# Patient Record
Sex: Male | Born: 1935 | Race: White | Hispanic: No | Marital: Married | Smoking: Former smoker
Health system: Southern US, Community
[De-identification: ages and names within clinical notes are randomized; demographics above are authoritative.]

## PROBLEM LIST (undated history)

## (undated) DIAGNOSIS — Z950 Presence of cardiac pacemaker: Secondary | ICD-10-CM

## (undated) DIAGNOSIS — N186 End stage renal disease: Secondary | ICD-10-CM

## (undated) DIAGNOSIS — I495 Sick sinus syndrome: Secondary | ICD-10-CM

## (undated) DIAGNOSIS — D759 Disease of blood and blood-forming organs, unspecified: Secondary | ICD-10-CM

## (undated) DIAGNOSIS — D649 Anemia, unspecified: Secondary | ICD-10-CM

## (undated) DIAGNOSIS — I2510 Atherosclerotic heart disease of native coronary artery without angina pectoris: Secondary | ICD-10-CM

## (undated) DIAGNOSIS — M1990 Unspecified osteoarthritis, unspecified site: Secondary | ICD-10-CM

## (undated) DIAGNOSIS — K5900 Constipation, unspecified: Secondary | ICD-10-CM

## (undated) DIAGNOSIS — M25519 Pain in unspecified shoulder: Secondary | ICD-10-CM

## (undated) DIAGNOSIS — I739 Peripheral vascular disease, unspecified: Secondary | ICD-10-CM

## (undated) DIAGNOSIS — I313 Pericardial effusion (noninflammatory): Secondary | ICD-10-CM

## (undated) DIAGNOSIS — M3214 Glomerular disease in systemic lupus erythematosus: Secondary | ICD-10-CM

## (undated) DIAGNOSIS — L0390 Cellulitis, unspecified: Secondary | ICD-10-CM

## (undated) DIAGNOSIS — N400 Enlarged prostate without lower urinary tract symptoms: Secondary | ICD-10-CM

## (undated) DIAGNOSIS — R0602 Shortness of breath: Secondary | ICD-10-CM

## (undated) DIAGNOSIS — I5033 Acute on chronic diastolic (congestive) heart failure: Secondary | ICD-10-CM

## (undated) DIAGNOSIS — I499 Cardiac arrhythmia, unspecified: Secondary | ICD-10-CM

## (undated) DIAGNOSIS — G2581 Restless legs syndrome: Secondary | ICD-10-CM

## (undated) DIAGNOSIS — Z8679 Personal history of other diseases of the circulatory system: Secondary | ICD-10-CM

## (undated) HISTORY — DX: Pain in unspecified shoulder: M25.519

## (undated) HISTORY — DX: Benign prostatic hyperplasia without lower urinary tract symptoms: N40.0

## (undated) HISTORY — DX: Pericardial effusion (noninflammatory): I31.3

## (undated) HISTORY — DX: Sick sinus syndrome: I49.5

## (undated) HISTORY — PX: HERNIA REPAIR: SHX51

## (undated) HISTORY — PX: APPENDECTOMY: SHX54

## (undated) HISTORY — DX: Glomerular disease in systemic lupus erythematosus: M32.14

## (undated) HISTORY — DX: Acute on chronic diastolic (congestive) heart failure: I50.33

## (undated) HISTORY — DX: Constipation, unspecified: K59.00

## (undated) HISTORY — DX: Peripheral vascular disease, unspecified: I73.9

## (undated) HISTORY — DX: Anemia, unspecified: D64.9

## (undated) HISTORY — DX: Cellulitis, unspecified: L03.90

## (undated) HISTORY — DX: Restless legs syndrome: G25.81

---

## 1997-05-29 ENCOUNTER — Other Ambulatory Visit: Admission: RE | Admit: 1997-05-29 | Discharge: 1997-05-29 | Payer: Self-pay | Admitting: *Deleted

## 1997-06-05 ENCOUNTER — Other Ambulatory Visit: Admission: RE | Admit: 1997-06-05 | Discharge: 1997-06-05 | Payer: Self-pay | Admitting: *Deleted

## 1997-06-20 ENCOUNTER — Other Ambulatory Visit: Admission: RE | Admit: 1997-06-20 | Discharge: 1997-06-20 | Payer: Self-pay | Admitting: Rheumatology

## 1997-08-09 ENCOUNTER — Other Ambulatory Visit: Admission: RE | Admit: 1997-08-09 | Discharge: 1997-08-09 | Payer: Self-pay | Admitting: *Deleted

## 2002-02-21 ENCOUNTER — Encounter: Admission: RE | Admit: 2002-02-21 | Discharge: 2002-02-21 | Payer: Self-pay | Admitting: Family Medicine

## 2002-02-21 ENCOUNTER — Encounter: Payer: Self-pay | Admitting: Family Medicine

## 2004-01-21 HISTORY — PX: PACEMAKER INSERTION: SHX728

## 2004-09-19 ENCOUNTER — Inpatient Hospital Stay (HOSPITAL_COMMUNITY): Admission: RE | Admit: 2004-09-19 | Discharge: 2004-09-20 | Payer: Self-pay | Admitting: Cardiovascular Disease

## 2004-09-19 ENCOUNTER — Encounter (INDEPENDENT_AMBULATORY_CARE_PROVIDER_SITE_OTHER): Payer: Self-pay | Admitting: Cardiovascular Disease

## 2004-09-19 DIAGNOSIS — Z950 Presence of cardiac pacemaker: Secondary | ICD-10-CM

## 2004-09-19 HISTORY — DX: Presence of cardiac pacemaker: Z95.0

## 2005-10-07 ENCOUNTER — Encounter: Admission: RE | Admit: 2005-10-07 | Discharge: 2005-10-07 | Payer: Self-pay | Admitting: Nephrology

## 2006-06-28 ENCOUNTER — Inpatient Hospital Stay (HOSPITAL_COMMUNITY): Admission: EM | Admit: 2006-06-28 | Discharge: 2006-06-30 | Payer: Self-pay | Admitting: Emergency Medicine

## 2006-11-18 ENCOUNTER — Encounter: Admission: RE | Admit: 2006-11-18 | Discharge: 2006-11-18 | Payer: Self-pay | Admitting: Nephrology

## 2007-02-23 ENCOUNTER — Emergency Department (HOSPITAL_COMMUNITY): Admission: EM | Admit: 2007-02-23 | Discharge: 2007-02-23 | Payer: Self-pay | Admitting: Emergency Medicine

## 2007-05-03 ENCOUNTER — Ambulatory Visit (HOSPITAL_COMMUNITY): Admission: RE | Admit: 2007-05-03 | Discharge: 2007-05-03 | Payer: Self-pay | Admitting: Cardiovascular Disease

## 2007-06-21 ENCOUNTER — Ambulatory Visit: Payer: Self-pay | Admitting: Vascular Surgery

## 2007-06-25 ENCOUNTER — Ambulatory Visit: Payer: Self-pay | Admitting: Vascular Surgery

## 2007-06-25 ENCOUNTER — Ambulatory Visit (HOSPITAL_COMMUNITY): Admission: RE | Admit: 2007-06-25 | Discharge: 2007-06-25 | Payer: Self-pay | Admitting: Vascular Surgery

## 2007-08-10 ENCOUNTER — Ambulatory Visit: Payer: Self-pay | Admitting: Vascular Surgery

## 2008-09-13 ENCOUNTER — Encounter: Admission: RE | Admit: 2008-09-13 | Discharge: 2008-09-13 | Payer: Self-pay | Admitting: Nephrology

## 2008-09-18 ENCOUNTER — Encounter: Admission: RE | Admit: 2008-09-18 | Discharge: 2008-09-18 | Payer: Self-pay | Admitting: Nephrology

## 2010-05-14 DIAGNOSIS — I2510 Atherosclerotic heart disease of native coronary artery without angina pectoris: Secondary | ICD-10-CM

## 2010-05-14 HISTORY — DX: Atherosclerotic heart disease of native coronary artery without angina pectoris: I25.10

## 2010-06-04 NOTE — Op Note (Signed)
Nathaniel Steele, Nathaniel Steele                ACCOUNT NO.:  [id number]   MEDICAL RECORD NO.:  [id number]          PATIENT TYPE:  AMB   LOCATION:  SDS                          FACILITY:  [facility]   PHYSICIAN:  Quita Skye. Hart Rochester, M.D.  DATE OF BIRTH:  [birth year]   DATE OF PROCEDURE:  06/25/2007  DATE OF DISCHARGE:  06/25/2007                               OPERATIVE REPORT   PREOPERATIVE DIAGNOSIS:  End-stage renal disease.   POSTOPERATIVE DIAGNOSIS:  End-stage renal disease.   OPERATION:  Creation of a left radial artery to cephalic vein AV fistula  (Cimino shunt).   SURGEON:  Quita Skye. Hart Rochester, MD   FIRST ASSISTANT:  Wilmon Arms, PA   ANESTHESIA:  Local.   PROCEDURE IN DETAIL:  The patient was taken to the operating room and  placed in supine position at which time the left upper extremity was  prepped with Betadine scrub solution and draped in routine sterile  manner.  After infiltration with 1% Xylocaine, short longitudinal  incision was made over the distal cephalic vein just proximal to the  wrist.  Cephalic vein was dissected free proximally and distally and was  ligated with 2-0 silk distally.  A large branch was ligated with 3-0  silk tie and divided, mobilizing the vein which was an excellent vein  being about 3 mm in size.  Artery was exposed beneath the fascia  encircled with vessel loops.  It had an excellent pulse.  After  occluding the artery proximally and distally with Vesseloops, it was  opened with 15 blade and extended with Potts scissors.  A 3-mm dilator  would easily traverse it.  The vein was carefully measured and  spatulated and anastomosed end-to-side with 6-0 Prolene.  Vesseloops was  then released.  There was excellent pulse and thrill in the fistula  distally.  No protamine or heparin was given.  The wounds were irrigated  with saline and closed in layers with Vicryl in subcuticular fashion.  Sterile dressing was applied.  The patient was taken to recovery room  in  satisfactory condition.      Quita Skye Hart Rochester, M.D.  Electronically Signed     JDL/MEDQ  D:  06/25/2007  T:  06/26/2007  Job:  [id number]

## 2010-06-04 NOTE — H&P (Signed)
NAMELYAL, HUSTED NO.:  [id number]   MEDICAL RECORD NO.:  [id number]          PATIENT TYPE:  INP   LOCATION:  1824                         FACILITY:  [facility]   PHYSICIAN:  Herbie Saxon, MDDATE OF BIRTH:  1926/04/21   DATE OF ADMISSION:  06/28/2006  DATE OF DISCHARGE:                              HISTORY & PHYSICAL   PRIMARY CARE PHYSICIAN:  Dr. Caryl Never.   CARDIOLOGIST:  Dr. Alanda Amass, [facility].   PRESENTING COMPLAINT:  1. Cough one week.  2. Fever one day.   HISTORY OF PRESENTING COMPLAINT:  This is a 77 year old Caucasian male  who was quite well until a week ago when he started coughing.  The cough  was productive of yellow phlegm.  No hemoptysis.  No palpitation, but he  noticed increasing shortness of breath, but generalized body aches,  malaise, anorexia, and he started having high-grade fever with chills  and rigor today necessitating him reporting to the emergency room.  Temperature on arrival was 102.  Patient has no symptoms so far today of  genitourinary or gastrointestinal system.  No cardiovascular symptoms at  present.  No loss of consciousness.  No skin rash.   PAST MEDICAL HISTORY:  1. Hypertension.  2. Lupus.   PAST SURGERIES:  He had the pacemaker inserted in 2006.   FAMILY HISTORY:  Noncontributory.   SOCIAL HISTORY:  No history of alcohol or drug abuse.  He used to smoke  likely less than 3 cigarettes a day, but he quit about 50 years ago.  He  smoked less than 10 years.   REVIEW OF SYSTEMS:  Twelve systems, pertinent positives as above.   MEDICATIONS:  Doses not known, include amlodipine, benazepril, Cymbalta,  aspirin, Plavix, __________ , and amiodarone.   GENERAL:  On examination, he is an elderly man, not critically-ill  looking, in mild respiratory distress.  VITAL SIGNS:  Temperature is 102, respiratory rate is 22, blood pressure  111/62, pulse rate is 71.  He is febrile to touch.  HEENT:   Pupils equal, reactive to light and accommodation.  Extraocular  muscles are intact.  No clubbing, no cyanosis, no jaundice.  NECK:  Elevated JVD.  No carotid bruits or thyromegaly.  CHEST:  Bilateral coarse breath sounds.  HEART:  Heart sounds 1 and 2 regular.  ABDOMEN:  Soft, nontender.  No organomegaly.  Bowel sounds are  normoactive.  -  No pedal edema.  No skin rash or ulcers.  NEUROLOGIC:  He is alert and oriented x3.  Peripheral pulses present.  No pedal edema.   He is a full code.   On examination, the available chemistries shows a sodium of 141,  potassium is 3.3, chloride is 107, bicarbonate is 22, BUN 40, creatinine  is 2.9, glucose is 117.  Other labs are pending.   The chest x-ray report pending.   DIAGNOSIS:  1. Pneumonia.  2. Sepsis.  3. Acute renal failure versus acute or chronic renal insufficiency.  4. History of Lupus.  5. Hypokalemia, on Lasix.  6. Hypertension, stable.  7. Cardiac arrhythmias, status post pacemaker insertion.  Patient is to be admitted to telemetry bed, gentle IV fluid hydration,  cooling blankets p.r.n., Tylenol 650 mg q.6 hours p.r.n. now for  temperature greater than 101.  Blood culture x2, urinalysis, urine  culture, chest x-ray, and EKG have been reviewed.  One set of cardiac  enzymes and EKG, check his electrolytes, calcium, magnesium phosphate,  TSH, and monitor his CBC.  We will check his coagulation workup.  Input  and output to be monitored.  O2, 2-3 liters by nasal canula, to keep his  pulse ox above 92.  Heparin 5000 units subcutaneous q.12 hours and  Phenergan 12.5 mg IV q.8 hours p.r.n. for nausea,  Proventil one unit  dose q.4 hours p.r.n. for shortness of breath.  Tylenol 650 mg p.o. q.4  hours p.r.n.  We will start him on antibiotics IV vancomycin at 750 mg  q.12 hours, Zosyn 2.25 mg IV q.8 hours.  We will resume his home  medications.  Supplement his potassium 40 mEq K-Dur, and 20 mEq daily.  We will check the urine  legionella antigen and the mycoplasma antibody.  Check an ABG stat, lactic acid level, LFTs, hemoccult.  We will follow  his CBC, BMP, and ABG in the morning.      Herbie Saxon, MD  Electronically Signed     MIO/MEDQ  D:  06/28/2006  T:  06/28/2006  Job:  [id number]   cc:   Evelena Peat, M.D.  Richard A. Alanda Amass, M.D.

## 2010-06-04 NOTE — Consult Note (Signed)
NEW PATIENT CONSULTATION   Nathaniel Steele, Nathaniel Steele  DOB:  [birth year]                                       06/21/2007  BJYNW#:[id number]   The patient is a 77 year old male with end-stage renal disease who has a  history hypertension, active lupus, chronic tremor, atrial fibrillation  and flutter with a pacemaker placed and on chronic Coumadin therapy.  He  has been evaluated for vascular access.  He is right-handed.  His  Coumadin is managed by Dr. Susa Griffins.  Creatinine has been  running in the 3.2 range.   PHYSICAL EXAM:  His blood pressure is 154/94, heart rate 68,  respirations are 14.  He is an elderly male in no apparent distress,  alert and oriented x3.  His carotid pulses are 3+ with no audible  bruits.  Upper extremity pulses are 3+ at the brachial and radial level.  He has easy bruisability of both upper extremities with thin skin.  He  does have a patent cephalic veins throughout both upper extremities.   Vein mapping was performed today in the [facility] office and reveals that his  left cephalic vein does appear to be adequate for AV fistula in the  forearm.  We will schedule him for creation of a Cimino fistula on  Friday June 5th at [facility] as an outpatient.  Hopefully this will  provide a satisfactory site for vascular access for this nice man.  He  realizes that this may not be successful and no further access may be  indicated or required.   Quita Skye Hart Rochester, M.D.  Electronically Signed   JDL/MEDQ  D:  06/21/2007  T:  06/22/2007  Job:  [id number]   cc:   Duke Salvia. Eliott Nine, M.D.  Evelena Peat, M.D.

## 2010-06-04 NOTE — Discharge Summary (Signed)
NAMEKAELEM, BRACH NO.:  [id number]   MEDICAL RECORD NO.:  [id number]          PATIENT TYPE:  INP   LOCATION:  4735                         FACILITY:  [facility]   PHYSICIAN:  Herbie Saxon, MDDATE OF BIRTH:  February 10, 1926   DATE OF ADMISSION:  06/28/2006  DATE OF DISCHARGE:  06/30/2006                               DISCHARGE SUMMARY   DISCHARGE DIAGNOSES:  1. Acute on chronic renal failure.  2. Lupus nephritis.  3. Left basal pneumonia, improved.  4. Anemia of chronic disease.  5. Acute respiratory distress, improved.  6. Hypertension, stable.  7. Cardiac arrhythmia  status post pacemaker.  8. Benign prostatic hypertrophy.  9. Dyslipidemia.  10.Reduced TSH on amiodarone.  11.Thrombocytopenia, stable.   HOSPITAL COURSE:  This 77 year old, Caucasian male was admitted via the  emergency room. He presented with high-grade fever of one-day duration.  He had been having a cough productive of yellow sputum for about a week  prior to presentation. Temperature on arrival was 102. He was having  chills and rigors. He had left basal crackles. Initial x-ray was mostly  showing basilar atelectasis . The patient rapidly  improved on IV  antibiotics. He was placed on IV Vancomycin and Zosyn. He still has  cough with nasal congestion but this is improving. He has no respiratory  distress. His hypokalemia was supplemented and the renal function is  improving. On presentation, the BUN was 40, creatinine 2.9. His BUN is  28, creatinine 1.9. Chest x-ray also did show some mild congestion but  no overt alveolar edema.   DISCHARGE CONDITION:  Stable.   DIET:  Heart healthy renal 2 gram sodium diet.   ACTIVITY:  As tolerated.   FOLLOWUP:  Followup with his primary care physician, Dr.in 5-7 days.  Followup with Dr. Alanda Amass cardiology in 4-6 weeks.   DISCHARGE MEDICATIONS:  1. Levaquin 500 mg daily for 5 days.  2. Tussionex 5 mg b.i.d.  3. multivite1 tab daily.  4.  Plavix 75 mg daily.  5. Lotensin 10 mg daily.  6. Lasix 20 mg daily.  7. Kay Ciel 10 mEq daily.  8. Amiodarone 100 mg daily.  9. Calcitriol 0.25 mg daily.  10.Zocor 20 mg daily.  11.Norvasc 10 mg daily.  12.Mucinex 1 spray b.i.d.   PHYSICAL EXAMINATION:  GENERAL:  An elderly man in no acute distress.  VITAL SIGNS:  Temperature is 98, pulse 69, respiratory rate 18, blood  pressure 154/92.  HEENT:  Pupils are equal and reactive to light and accommodation.  NECK:  Supple. There are reduced breath sounds at the bases.  ABDOMEN:  Soft, nontender no organomegaly. Bowel sounds normal active.  NEUROLOGIC:  He is alert and oriented x3.  EXTREMITIES:  Peripheral pulses present. No pedal edema.   LABORATORY DATA:  The sodium is 142, potassium 4.2, chloride 104,  bicarbonate 26, glucose 99, BUN 28, creatinine 1.9.      Herbie Saxon, MD  Electronically Signed     MIO/MEDQ  D:  06/30/2006  T:  06/30/2006  Job:  [id number]

## 2010-06-04 NOTE — Assessment & Plan Note (Signed)
OFFICE VISIT   JAYTHAN, HINELY  DOB:  01/23/1926                                       08/10/2007  QIHKV#:[id number]   The patient has end-stage renal disease not yet on hemodialysis.  Had  creation of left radial artery with cephalic vein AV fistula by me June  5.  He has an excellent fistula with nice pulse and palpable thrill up  to the antecubital area.  There is no evidence of steal in the left hand  with no numbness or pain.  His incision has healed nicely.  He was  reassured regarding this.  This should be a good fistula if he needs  dialysis in the future and return to see [place] on a p.r.n. basis.   Quita Skye Hart Rochester, M.D.  Electronically Signed   JDL/MEDQ  D:  08/10/2007  T:  08/12/2007  Job:  [id number]   cc:   Duke Salvia. Eliott Nine, M.D.

## 2010-06-04 NOTE — Procedures (Signed)
CEPHALIC VEIN MAPPING   INDICATION:  Pre-op AVF vein mapping.   HISTORY:  ESRD.   EXAM:  Bilateral cephalic vein mapping.   The right cephalic vein is compressible.   Diameter measurements range from 0.32 cm to 0.60 cm.   The left cephalic vein is compressible.   Diameter measurements range from 0.36 cm to 0.52 cm.   See attached worksheet for all measurements.   IMPRESSION:  Patent bilateral cephalic veins which are of acceptable  diameter for use as a dialysis access site.    ___________________________________________  Quita Skye. Hart Rochester, M.D.   AS/MEDQ  D:  06/21/2007  T:  06/21/2007  Job:  [id number]

## 2010-06-07 NOTE — Op Note (Signed)
[name], [name] NO.:  [id number]   MEDICAL RECORD NO.:  [id number]          PATIENT TYPE:  OIB   LOCATION:  2899                         FACILITY:  [facility]   PHYSICIAN:  Richard A. Alanda Amass, M.D.DATE OF BIRTH:  [birth year]   DATE OF PROCEDURE:  09/19/2004  DATE OF DISCHARGE:                                 OPERATIVE REPORT   PROCEDURE:  Implantation of permanent DDDR rate responsive Medtronic  Enrhythm pulse generator model number [id number]; SN: [id number] with passive  fixation Medtronic atrial and ventricular electrodes.   IMPLANTING PHYSICIAN:  Richard A. Alanda Amass, M.D.   ESTIMATED BLOOD LOSS:  Approximately 50 mL.   ANESTHESIA:  1% local Xylocaine, 2 mg Nubain, 1 mg Versed IV for sedation.   ANTIBIOTIC PROPHYLAXIS:  vancomycin 1 g IV preoperatively.   COMPLICATIONS:  Inadvertent subclavian artery puncture recognized,  resolved, no sequelae, monitored peri- and postoperatively.   PREOPERATIVE DIAGNOSES:  1.  Sick sinus syndrome - paroxysmal atrial fibrillation on chronic      amiodarone therapy stable.  2.  Symptomatic sinus bradycardia rates less than 40 with recurrent      presyncope and documented junctional escape rhythm.  3.  Chronic renal insufficiency, history of focal proliferative lupus      nephritis.  4.  Chronic renal insufficiency presumably secondary to systemic lupus      erythematosus.  5.  Systemic lupus erythematosus stable.  6.  Hyperlipidemia.  7.  Prior history of chest pain negative Cardiolite April 2006.  8.  Chronic mild thrombocytopenia platelet count 123,000.  9.  Systemic hypertension.   POSTOPERATIVE DIAGNOSES:  1.  Sick sinus syndrome - paroxysmal atrial fibrillation on chronic      amiodarone therapy stable.  2.  Symptomatic sinus bradycardia rates less than 40 with recurrent      presyncope and documented junctional escape rhythm.  3.  Chronic renal insufficiency, history of focal proliferative lupus  nephritis.  4.  Chronic renal insufficiency presumably secondary to systemic lupus      erythematosus.  5.  Systemic lupus erythematosus stable.  6.  Hyperlipidemia.  7.  Prior history of chest pain negative Cardiolite April 2006.  8.  Chronic mild thrombocytopenia platelet count 123,000.  9.  Systemic hypertension.   ATRIAL ELECTRODE:  Medtronic number [id number] cm; SN:  [id number] V.   VENTRICULAR ELECTRODE:  Medtronic number [id number] cm; SN:  [id number] V.   Magnet rate of BOL equals 85, magnet rate of VRI drops down to 65 with  reversion to VVI.   THRESHOLDS:  Bipolar atrial: P equal 3.5 mV, impedance 612 ohms, threshold  0.9 v at 0.5 milliseconds.   Bipolar ventricular: R equal 19.5 mV, impedance 732 ohms, threshold 0.4 v,  0.5 milliseconds.   Unipolar atrial: P equal 2.8 mV, impedance 422 ohms, threshold 0.4 v, 0.5  milliseconds.   Unipolar ventricular: R equal 14.7, mV, impedance 612 ohms, threshold 0.1 v,  0.5 milliseconds.   No diaphragmatic pacing on PSA testing at 10 volt output unipolar and  bipolar atrial and ventricular electrodes.   PROCEDURE:  The patient reported to the second  floor CP lab in a post  absorptive state after 5 mg Valium p.o. premedication, 1 g vancomycin IV  (allergic to penicillin). The right anterior chest was prepped, draped in  the usual manner, 1% Xylocaine was used for local anesthesia. A right  infraclavicular curvilinear transverse incision was performed and brought  down the prepectoral fascia using electrocautery to control hemostasis and  blunt dissection. Pulse generator pocket was then formed.   An 18 thin-wall needle was used through the pocket to enter the subclavian  vein under fluoroscopic control. The artery was inadvertently punctured on  one occasion and the needle pulled back without difficulty. Under  fluoroscopic control, we then punctured the vessel again.  A 7 French peel-  away [place] introducer was advanced along with the  guidewire and then a Wholey  wire. There was significant tortuosity of this vessel and despite the  guidewire going in the area of the right heart and SVC RA, I was concerned  about inadvertant placement of this. There was also high pressure on blood  flow-back but hemostasis was controlled. For this reason the wire and sheath  were removed and pressure was placed over the insertion site. The patient  had transient hypotension. He was given 0.5 of atropine because of his  resting bradycardia and dopamine was started in the laboratory along with  increased IV fluids. Type and cross was ordered in the event blood was  necessary and emergency 2-D echo was called for. The patient was stable  throughout all of this, he had a transient drop in blood pressure to 60 to  70 mmHg lasting approximately 30 to 60 seconds and this came up promptly  with the atropine and fluids. The blood pressure came up to 140 to 160 range  and remained there with dopamine taken off. He was responsive, fully alert  and asymptomatic throughout this. Cardiac silhouette remained stable as did  heart rate. Under fluoroscopic control, I then used an 18 thin-wall needle  to enter the subclavian vein with an anterior puncture confirmed by flow-  back and a stainless steel J-tip wire crossing under fluoroscopy. Using two  #9 peel-away [place] introducers, the atrial and ventricular electrodes were  introduced in tandem, peeled away and the guide wire was retained until the  electrodes were positioned. RV electrode was positioned in the right  ventricular apex and the right atrial electrode was positioned in the right  atrial appendage confirm by fluoroscopy and rotational maneuvers. Both leads  were stable with cough and deep breathing maneuvers. Blood pressure remained  stable. Threshold testing was performed, unipolar and bipolar. There was no diaphragmatic pacing or esophageal pacing at 10 volt output on either  electrode  during this.  The electrodes were secured at the insertion site  with a previously placed #1 figure-of-eight silk suture around a tissue  sewing collar to control hemostasis and prevent lead migration. They were  further secured with two #1 silk sutures around a silicone sewing collar for  each electrode. The guidewire had been removed prior to this. The pocket was  irrigated with 500 mg kanamycin solution. Fluoroscopy showed good position  of the atrial and ventricular electrodes. The patient was stable clinically  with good blood pressure and overriding pacer at this time. The generator  was hooked to the electrodes in the proper atrial and ventricular sequence  and delivered into the pocket with the electrodes looped behind after the  single hex nut was tightened for each electrode. The  sponge count was  correct. The subcutaneous tissue was closed with two separate running layers  of 2-0 Vicryl suture and the skin was closed with 4-0 subcuticular Vicryl  suture. Steri-Strips were applied. Fluoroscopy once again showed good  position of the atrial and ventricular electrodes.  The patient's blood  pressure was 141 to 150 mmHg. A 2-D echo was then done in the lab (this had  been called for at the time of the patient's transient hypotension). This  revealed no evidence of pericardial effusion and good ventricular function  and no evidence of RV collapse or any tamponade physiology (full report to  follow).   The patient was transferred to the holding area for postoperative care and  reprogramming in stable condition. During the procedure, type and cross was  drawn, however, this was diluted on the blood drawing by the technician so  he will get type and screen postoperatively and we will follow him in the  CCU overnight. The patient is quite stable post procedure and on transfer.      Richard A. Alanda Amass, M.D.  Electronically Signed     RAW/MEDQ  D:  09/19/2004  T:  09/19/2004   Job:  [id number]   cc:   CP Lab   Second Floor Pacer Lab   Pacer Lab at Dr. Kandis Cocking office   Evelena Peat, M.D.  [address]  [place]  [place] [address]  Fax: [phone]   Duke Salvia. Eliott Nine, M.D.  [address]  [place]  [place] [address]  Fax: [phone]   Demetria Pore. Coral Spikes, M.D.  [address]  [address]  [place]  [place] [address]  Fax: [phone]

## 2010-06-07 NOTE — Discharge Summary (Signed)
Nathaniel Steele, Nathaniel Steele NO.:  [id number]   MEDICAL RECORD NO.:  [id number]          PATIENT TYPE:  INP   LOCATION:  2929                         FACILITY:  [facility]   PHYSICIAN:  Richard A. Alanda Amass, M.D.DATE OF BIRTH:  1926/02/10   DATE OF ADMISSION:  09/19/2004  DATE OF DISCHARGE:  09/20/2004                                 DISCHARGE SUMMARY   ADMISSION DIAGNOSES:  1.  Symptomatic bradycardia with recent syncope.  2.  Chronic renal insufficiency.  3.  Paroxysmal atrial fibrillation.  4.  Lupus nephritis.  5.  Dyslipidemia.   DISCHARGE DIAGNOSES:  1.  Symptomatic bradycardia with recent syncope.  2.  Chronic renal insufficiency.  3.  Paroxysmal atrial fibrillation.  4.  Lupus nephritis.  5.  Dyslipidemia.   PROCEDURE:  Implantation of an EnRhythm [id number] Medtronic permanent  transvenous pacemaker dual chamber with a 53 cm and a 58 cm Medtronic leads.   BRIEF HISTORY:  The patient is a 77 year old white male who has been  followed by Dr. Alanda Amass with PAF who had been on aspirin therapy alone.  He underwent a monitor event in May and study revealed episodes of  significant bradycardia with his heart rate slowing to the 40s and periods  of junctional rhythm.  On his last visit September 09, 2004 patient complained  of weakness, dizziness, episodes of presyncope, severe weakness and  dizziness and this happening frequently.  He had one episode less than a  week ago that lasted the entire day.  He also noticed that he cannot do as  much physical work as before.  He was seen and examined by Dr. Alanda Amass and  at that point it was Dr. Kandis Cocking opinion he had a symptomatic  bradycardia and he was admitted for elective permanent transvenous pacemaker  at this time.   PAST MEDICAL HISTORY:  As above.   SOCIAL HISTORY:  He is married.  Does not smoke or use alcohol.   MEDICATIONS ON ADMISSION:  1.  Amiodarone 100 mg daily.  2.  Furosemide 40 mg daily.  3.   Felodipine 10 mg daily.  4.  Aspirin 81 mg daily.  5.  Vitamin E one daily.  6.  Simvastatin 20 mg daily.  7.  Ocuvite one daily.   ALLERGIES:  PENICILLIN.   HOSPITAL COURSE:  Patient was admitted and underwent placement of permanent  transvenous pacemaker to above.  He was seen the following a.m. by Dr.  Alanda Amass.  He is pacing and sensing appropriately.  His incision looked  good and it was Dr. Kandis Cocking opinion he could be discharged home.  His  postoperative laboratories show a white count of 4.6, a hemoglobin of 12.8,  hematocrit of 37.  His total cholesterol was 129, triglycerides 134, HDL 31,  and LDL 71.  No other laboratories were done.   DISCHARGE MEDICATIONS:  Patient will go home on his preadmission medicines  listed above.  1.  Amiodarone 100 mg daily.  2.  Felodipine 10 mg daily.  3.  Lasix 40 mg daily.  4.  Simvastatin 20 mg daily.  5.  Aspirin 81 mg daily.  We are going to have him start that in three days.  6.  Vitamin E one daily.  7.  Ocuvite one daily.   He will follow up in the office with wound check next week and we will have  him return to see Dr. Alanda Amass in two to three weeks.  We will have him  give him the exercise limitation sheet prior to discharge also.  He is  instructed to contact Dr. Caryl Never and Dr. Eliott Nine for follow-up as  routinely scheduled.   CONDITION ON DISCHARGE:  Improved.      Eber Hong, P.A.      Richard A. Alanda Amass, M.D.  Electronically Signed    WDJ/MEDQ  D:  09/20/2004  T:  09/20/2004  Job:  [id number]   cc:   Evelena Peat, M.D.  [address]  [place]  [place] [address]  Fax: [phone]   Duke Salvia. Eliott Nine, M.D.  [address]  [place]  [place] [address]  Fax: [phone]

## 2010-09-09 ENCOUNTER — Ambulatory Visit
Admission: RE | Admit: 2010-09-09 | Discharge: 2010-09-09 | Disposition: A | Discharge status: Home / Self Care | Payer: Medicare Other | Source: Ambulatory Visit | Attending: Cardiovascular Disease | Admitting: Cardiovascular Disease

## 2010-09-09 ENCOUNTER — Other Ambulatory Visit: Payer: Self-pay | Admitting: Cardiovascular Disease

## 2010-09-09 DIAGNOSIS — M25571 Pain in right ankle and joints of right foot: Secondary | ICD-10-CM

## 2010-10-11 LAB — COMPREHENSIVE METABOLIC PANEL
AST: 25
Albumin: 4.1
BUN: 38 — ABNORMAL HIGH
Chloride: 105
Creatinine, Ser: 2.47 — ABNORMAL HIGH
GFR calc Af Amer: 31 — ABNORMAL LOW
Potassium: 4.4
Total Protein: 7.2

## 2010-10-11 LAB — CBC
HCT: 39
MCV: 87.8
Platelets: 117 — ABNORMAL LOW
RDW: 14.9
WBC: 4.1

## 2010-10-11 LAB — DIFFERENTIAL
Eosinophils Relative: 2
Lymphocytes Relative: 18
Monocytes Absolute: 0.5
Monocytes Relative: 12
Neutro Abs: 2.8

## 2010-10-11 LAB — URINALYSIS, ROUTINE W REFLEX MICROSCOPIC
Bilirubin Urine: NEGATIVE
Glucose, UA: NEGATIVE
Hgb urine dipstick: NEGATIVE
Specific Gravity, Urine: 1.009
pH: 7

## 2010-10-17 LAB — POCT I-STAT 4, (NA,K, GLUC, HGB,HCT)
Glucose, Bld: 91
HCT: 40
Operator id: 274481
Potassium: 4.7
Sodium: 138

## 2010-10-17 LAB — APTT: aPTT: 37

## 2010-11-07 LAB — CBC
HCT: 36.9 — ABNORMAL LOW
Hemoglobin: 10.9 — ABNORMAL LOW
Hemoglobin: 12.2 — ABNORMAL LOW
MCHC: 32.8
MCV: 87.7
MCV: 88.1
RBC: 3.76 — ABNORMAL LOW
RDW: 14.7 — ABNORMAL HIGH

## 2010-11-07 LAB — LEGIONELLA ANTIGEN, URINE: Legionella Antigen, Urine: NEGATIVE

## 2010-11-07 LAB — BLOOD GAS, ARTERIAL
FIO2: 0.28
TCO2: 23.9
pCO2 arterial: 37.2
pH, Arterial: 7.405

## 2010-11-07 LAB — COMPREHENSIVE METABOLIC PANEL
ALT: 51
ALT: 64 — ABNORMAL HIGH
Albumin: 2.9 — ABNORMAL LOW
Alkaline Phosphatase: 56
CO2: 24
Calcium: 8.1 — ABNORMAL LOW
Calcium: 8.5
GFR calc non Af Amer: 25 — ABNORMAL LOW
Glucose, Bld: 110 — ABNORMAL HIGH
Potassium: 4.2
Sodium: 142
Sodium: 142
Total Bilirubin: 0.8
Total Protein: 5.8 — ABNORMAL LOW

## 2010-11-07 LAB — URINE CULTURE

## 2010-11-07 LAB — POCT I-STAT 3, ART BLOOD GAS (G3+)
Acid-base deficit: 1
Bicarbonate: 21.4
TCO2: 22

## 2010-11-07 LAB — URINALYSIS, ROUTINE W REFLEX MICROSCOPIC
Ketones, ur: NEGATIVE
Nitrite: NEGATIVE
Protein, ur: NEGATIVE
Urobilinogen, UA: 1

## 2010-11-07 LAB — APTT: aPTT: 37

## 2010-11-07 LAB — PLATELET COUNT: Platelets: 94 — ABNORMAL LOW

## 2010-11-07 LAB — HEPATIC FUNCTION PANEL
ALT: 39
Albumin: 3.4 — ABNORMAL LOW
Alkaline Phosphatase: 72
Total Protein: 6.5

## 2010-11-07 LAB — I-STAT 8, (EC8 V) (CONVERTED LAB)
Acid-base deficit: 2
Bicarbonate: 22
Chloride: 107
HCT: 40
Operator id: 257131
Sodium: 141
pCO2, Ven: 33.1 — ABNORMAL LOW

## 2010-11-07 LAB — PROTIME-INR: INR: 1.1

## 2010-11-07 LAB — CARDIAC PANEL(CRET KIN+CKTOT+MB+TROPI): CK, MB: 2.1

## 2010-11-07 LAB — POCT I-STAT CREATININE: Creatinine, Ser: 2.9 — ABNORMAL HIGH

## 2010-11-07 LAB — CULTURE, BLOOD (ROUTINE X 2): Culture: NO GROWTH

## 2010-11-07 LAB — LIPID PANEL: Triglycerides: 52

## 2010-11-07 LAB — MYCOPLASMA PNEUMONIAE ANTIBODY, IGM: Mycoplasma pneumo IgM: 113 Units/mL (ref <770)

## 2010-11-07 LAB — PHOSPHORUS: Phosphorus: 3

## 2011-01-31 ENCOUNTER — Other Ambulatory Visit: Payer: Self-pay | Admitting: Cardiovascular Disease

## 2011-01-31 ENCOUNTER — Encounter (HOSPITAL_COMMUNITY): Payer: Self-pay | Admitting: Pharmacy Technician

## 2011-02-12 ENCOUNTER — Other Ambulatory Visit: Payer: Self-pay

## 2011-02-12 ENCOUNTER — Institutional Professional Consult (permissible substitution) (INDEPENDENT_AMBULATORY_CARE_PROVIDER_SITE_OTHER): Payer: Medicare Other | Admitting: Cardiothoracic Surgery

## 2011-02-12 ENCOUNTER — Encounter: Payer: Self-pay | Admitting: Cardiothoracic Surgery

## 2011-02-12 ENCOUNTER — Other Ambulatory Visit: Payer: Self-pay | Admitting: Cardiothoracic Surgery

## 2011-02-12 DIAGNOSIS — I340 Nonrheumatic mitral (valve) insufficiency: Secondary | ICD-10-CM

## 2011-02-12 DIAGNOSIS — N186 End stage renal disease: Secondary | ICD-10-CM | POA: Insufficient documentation

## 2011-02-12 DIAGNOSIS — I359 Nonrheumatic aortic valve disorder, unspecified: Secondary | ICD-10-CM

## 2011-02-12 DIAGNOSIS — I350 Nonrheumatic aortic (valve) stenosis: Secondary | ICD-10-CM

## 2011-02-12 DIAGNOSIS — M329 Systemic lupus erythematosus, unspecified: Secondary | ICD-10-CM

## 2011-02-12 DIAGNOSIS — N189 Chronic kidney disease, unspecified: Secondary | ICD-10-CM

## 2011-02-12 DIAGNOSIS — I059 Rheumatic mitral valve disease, unspecified: Secondary | ICD-10-CM

## 2011-02-12 NOTE — Progress Notes (Signed)
PCP is No primary provider on file. Referring Provider is Governor Rooks, MD                                      [address] [place].[address]            [name] [address]          [phone]       Chief Complaint  Patient presents with  . Aortic Stenosis    eval and treat    HPI: The patient is an 78 year old Caucasian male followed by Dr. Alanda Amass for aortic stenosis with recent onset of exertional dyspnea and a recent echo showing aortic valve area now of 0.8 consistent with severe aortic stenosis. One year ago his aortic stenosis was moderate with a Doppler velocity of 3.3 m/s. His velocity now is 4.1 m/s with a mean gradient of 40. Clinically the patient denies chest pain or syncope or edema but his dyspnea on exertion has worsened and is unable to walk to the mailbox without becoming short of breath. The patient remains with a very active lifestyle and used to do gardening and yard work which is unable to do at present. The patient states the murmur he can approximately 10 years ago. He did not have a murmur as a young man. He denies history of rheumatic heart disease. He denies familial history of aortic stenosis or valve replacement surgery. The patient has total upper and lower dental plates. The patient has had a previous pacemaker placed for 5 years ago for atrial fibrillation with slow heart rate response to antiarrhythmic therapy    The patient is scheduled for a cardiac catheterization this week. Office 2-D echo reports indicate severe ALS, moderate AI, moderate MR with a calcified annulus, ejection fraction 50%  The patient has a chronically elevated creatinine and 18 months ago had an AV fistula placed in his left wrist which has not yet been used for hemodialysis. The most recent creatinine was 2.9 with a BUN of 50. The patient states he has good urine volume taking Lasix 40 mg twice daily.   Past Medical History  Diagnosis Date  . Chronic kidney disease     No  past surgical history on file.  No family history on file.  Social History History  Substance Use Topics  . Smoking status: Former Smoker    Quit date: 02/11/1957  . Smokeless tobacco: Not on file  . Alcohol Use: No    Current Outpatient Prescriptions  Medication Sig Dispense Refill  . amiodarone (PACERONE) 200 MG tablet Take 200 mg by mouth daily.      Marland Kitchen amLODipine (NORVASC) 10 MG tablet Take 10 mg by mouth daily.      Marland Kitchen aspirin EC 81 MG tablet Take 81 mg by mouth daily.      . calcitRIOL (ROCALTROL) 0.25 MCG capsule Take 0.25 mcg by mouth daily.      . finasteride (PROSCAR) 5 MG tablet Take 5 mg by mouth daily.      . fish oil-omega-3 fatty acids 1000 MG capsule Take 1 g by mouth daily.      . furosemide (LASIX) 40 MG tablet Take 80 mg by mouth daily.      . metoprolol tartrate (LOPRESSOR) 25 MG tablet Take 12.5 mg by mouth 2 (two) times daily.      . Multiple Vitamins-Minerals (OCUVITE PRESERVISION PO) Take  1 tablet by mouth daily.      Marland Kitchen rOPINIRole (REQUIP) 0.25 MG tablet Take 0.25 mg by mouth 3 (three) times daily.      Marland Kitchen warfarin (COUMADIN) 5 MG tablet Take 2.5-5 mg by mouth daily. Take 1 tablet on Monday,Wednesday,& Friday Take 0.5 tablet all other days        Allergies  Allergen Reactions  . Aspirin Hives    Patient states that he can take the enteric coated but not the uncoated  . Penicillins Hives    Review of Systems  constitutional review negative for fever or weight loss HEENT total dental plates no difficulty swallowing Thoracic no history thoracic trauma abnormal chest x-ray pneumothorax hemoptysis or recent URI Cardiac history of aortic stenosis increasing and severe aorta the past several months with increasing dyspnea on exertion. Coronary arteriograms are pending. GI no history of jaundice ulcer disease or blood per rectum Endocrine negative for diabetes Musculoskeletal recent left ankle sprain negative for gout Neurologic no history stroke he has a  tremor at rest he is right-hand dominant  BP 134/76  Pulse 70  Resp 16  Ht 5\' 7"  (1.702 m)  Wt 168 lb (76.204 kg)  BMI 26.31 kg/m2  SpO2 96% Physical Exam general appearance that of an elderly Caucasian male computerized wife he has a resting tremor and head jerk.   HEENT normocephalic pupils equal dentition with plates Neck supple without JVD positive transmitted murmur of a aortic stenosis, diminished carotid pulses Thorax non-tender no deformity clear breath sounds bilaterally Cardiac regular rhythm high-pitched 4/6 systolic murmur across the precordium no diastolic murmur noted Abdomen soft nontender without pulsatile mass Extremities nontender no cyanosis or edema  left wrist as a AV fistula with a strong thrill Neurologic although he has significant palsy type movements he has a strong gait no focal deficit or weakness    Ditearagnostic Tests: 2-D echo reports are reviewed cardiac catheterization pending    Impression/Plan   :Severe aortic stenosis by chest wall echo. Fairly well preserved LV function. Moderate MR. Increasing symptoms of shortness of breath. Elevated creatinine 2.9 the patient will need a cardiac catheterization. We will wait approximately one week before surgery to allow renal recovery. Aortic valve replacement with possible combined bypass grafting scheduled for Monday, February 4. The patient will return to the office in one week for review of the results of his cardiac catheterization and discuss the details of surgery again.

## 2011-02-13 ENCOUNTER — Ambulatory Visit (HOSPITAL_COMMUNITY)
Admission: RE | Admit: 2011-02-13 | Discharge: 2011-02-13 | Disposition: A | Discharge status: Home / Self Care | Payer: Medicare Other | Source: Ambulatory Visit | Attending: Cardiovascular Disease | Admitting: Cardiovascular Disease

## 2011-02-13 ENCOUNTER — Encounter (HOSPITAL_COMMUNITY)
Admission: RE | Disposition: A | Discharge status: Home / Self Care | Payer: Self-pay | Source: Ambulatory Visit | Attending: Cardiovascular Disease

## 2011-02-13 DIAGNOSIS — I359 Nonrheumatic aortic valve disorder, unspecified: Secondary | ICD-10-CM | POA: Insufficient documentation

## 2011-02-13 DIAGNOSIS — I2510 Atherosclerotic heart disease of native coronary artery without angina pectoris: Secondary | ICD-10-CM | POA: Insufficient documentation

## 2011-02-13 HISTORY — PX: LEFT AND RIGHT HEART CATHETERIZATION WITH CORONARY ANGIOGRAM: SHX5449

## 2011-02-13 HISTORY — PX: CARDIAC CATHETERIZATION: SHX172

## 2011-02-13 LAB — BASIC METABOLIC PANEL
BUN: 56 mg/dL — ABNORMAL HIGH (ref 6–23)
CO2: 24 mEq/L (ref 19–32)
Calcium: 9.7 mg/dL (ref 8.4–10.5)
GFR calc Af Amer: 23 mL/min — ABNORMAL LOW (ref >90)
Glucose, Bld: 92 mg/dL (ref 70–99)

## 2011-02-13 LAB — POCT I-STAT 3, ART BLOOD GAS (G3+)
O2 Saturation: 94 %
TCO2: 24 mmol/L (ref 0–100)
pCO2 arterial: 38.9 mmHg (ref 35.0–45.0)
pH, Arterial: 7.382 (ref 7.350–7.450)
pO2, Arterial: 73 mmHg — ABNORMAL LOW (ref 80.0–100.0)

## 2011-02-13 LAB — POCT I-STAT 3, VENOUS BLOOD GAS (G3P V)
Acid-base deficit: 2 mmol/L (ref 0.0–2.0)
Bicarbonate: 23.8 mEq/L (ref 20.0–24.0)
O2 Saturation: 76 %
pCO2, Ven: 42.6 mmHg — ABNORMAL LOW (ref 45.0–50.0)
pO2, Ven: 43 mmHg (ref 30.0–45.0)

## 2011-02-13 SURGERY — LEFT AND RIGHT HEART CATHETERIZATION WITH CORONARY ANGIOGRAM
Anesthesia: LOCAL

## 2011-02-13 MED ORDER — SODIUM CHLORIDE 0.9 % IV SOLN: INTRAVENOUS | Status: DC

## 2011-02-13 MED ORDER — ACETAMINOPHEN 325 MG PO TABS: 650.0000 mg | ORAL_TABLET | ORAL | Status: DC | PRN

## 2011-02-13 MED ORDER — SODIUM CHLORIDE 0.9 % IJ SOLN: 3.0000 mL | INTRAMUSCULAR | Status: DC | PRN

## 2011-02-13 MED ORDER — ONDANSETRON HCL 4 MG/2ML IJ SOLN: 4.0000 mg | Freq: Four times a day (QID) | INTRAMUSCULAR | Status: DC | PRN

## 2011-02-13 MED ORDER — NITROGLYCERIN 0.2 MG/ML ON CALL CATH LAB
INTRAVENOUS | Status: AC
  Filled 2011-02-13: qty 1

## 2011-02-13 MED ORDER — DIAZEPAM 5 MG PO TABS
ORAL_TABLET | ORAL | Status: AC
  Administered 2011-02-13: 5 mg via ORAL
  Filled 2011-02-13: qty 1

## 2011-02-13 MED ORDER — DIAZEPAM 5 MG PO TABS
5.0000 mg | ORAL_TABLET | ORAL | Status: AC
  Administered 2011-02-13: 5 mg via ORAL

## 2011-02-13 MED ORDER — SODIUM CHLORIDE 0.9 % IV SOLN
INTRAVENOUS | Status: DC
  Administered 2011-02-13: 09:00:00 via INTRAVENOUS

## 2011-02-13 MED ORDER — LIDOCAINE HCL (PF) 1 % IJ SOLN
INTRAMUSCULAR | Status: AC
  Filled 2011-02-13: qty 30

## 2011-02-13 MED ORDER — SODIUM CHLORIDE 0.9 % IJ SOLN: 3.0000 mL | Freq: Two times a day (BID) | INTRAMUSCULAR | Status: DC

## 2011-02-13 MED ORDER — SODIUM CHLORIDE 0.9 % IV SOLN: 250.0000 mL | INTRAVENOUS | Status: DC | PRN

## 2011-02-13 MED ORDER — FENTANYL CITRATE 0.05 MG/ML IJ SOLN
INTRAMUSCULAR | Status: AC
  Filled 2011-02-13: qty 2

## 2011-02-13 MED ORDER — MIDAZOLAM HCL 2 MG/2ML IJ SOLN
INTRAMUSCULAR | Status: AC
  Filled 2011-02-13: qty 2

## 2011-02-13 MED ORDER — HEPARIN (PORCINE) IN NACL 2-0.9 UNIT/ML-% IJ SOLN
INTRAMUSCULAR | Status: AC
  Filled 2011-02-13: qty 2000

## 2011-02-13 NOTE — H&P (Signed)
  Updated H & P:   See complete dictated note by Dr. Alanda Amass.  Pt presents for R and L cath for severe Aortic Stenosis.  Pt saw Dr PVT yesterday and is tentatively set up for AVR on 02/24/11.  Pt has renal insufficiency, stage 4 and is s/p AV fistula insertion 18 mo ago. No change in PEx.  Plan cath with minimal contrast.

## 2011-02-13 NOTE — Brief Op Note (Signed)
02/13/2011  11:33 AM  PATIENT:  JAHKARI MACLIN  78 y.o. male  PRE-OPERATIVE DIAGNOSIS:  Aortic Stenosis  Full note dictated; see diagram.  DICTATION # [id number], [id number]  RA: 6 RV 23/6 PA 23/12 PC 11  CO 5.1 CI 2.7   Pk to pk gradient LV to Aorta 25 - 32 mm Hg. AVA 1.1 cm2  LMCA:  Nl LAD:  70% stenosis after DX1 Lcx:  Nl RCAL:   90% prox -mid stenosis before Ant RV branch  Rec:  AVR/CABG  F/U with Dr. Jinny Blossom, MD, [facility] 02/13/2011 11:34 AM

## 2011-02-14 NOTE — Cardiovascular Report (Signed)
NAMEPHILLIPE, Nathaniel Steele NO.:  [id number]  MEDICAL RECORD NO.:  [id number]  LOCATION:  [facility]                         FACILITY:  [facility]  PHYSICIAN:  Nicki Guadalajara, M.D.     DATE OF BIRTH:  Jan 14, 1927  DATE OF PROCEDURE: DATE OF DISCHARGE:  02/13/2011                           CARDIAC CATHETERIZATION   PROCEDURE:  Right and left heart cardiac catheterization.  INDICATIONS:  Mr. Nathaniel Steele is an 78 year old gentleman who has a remote history of SLE with remote lupus nephritis, who has chronic kidney disease with renal insufficiency, stage IV.  Approximately 18 months ago, he had undergone AV fistula insertion, but he has not needed to utilize this.  The patient has been followed with aortic stenosis. Recently, an echo Doppler study suggested progressive aortic stenosis with a mean gradient of 40 and the valve area of 0.8 which had increased from a prior echo.  The patient denies any chest pain.  He apparently saw Dr. Kathlee Nations Trigt yesterday and is tentatively scheduled for aortic valve surgery.  The patient was scheduled by Dr. Alanda Amass for me to perform right and left heart catheterization.  The patient and family were aware of the potential that his kidney function may further deteriorate with both contrast as well as surgery.  PROCEDURE IN DETAIL:  After premedication with Versed 1 mg plus fentanyl 25 mcg, the patient was prepped and draped in usual fashion.  His right femoral artery was punctured anteriorly.  The arterial venous sheaths were inserted without difficulty.  Swan-Ganz catheter was inserted into the femoral vein and advanced to the RA, RV, PA and PC positions under hemodynamic monitoring.  A 5-French pigtail catheter was then inserted into the arterial sheath and simultaneous PA and AO pressures were recorded.  O2 saturation was obtained in the PA and central aorta. Cardiac output was obtained by the thermodilution method as well as the assumed  Fick method.  Using a straight wire, the stenotic aortic valve was able to be crossed and a pigtail catheter was advanced into the left ventricle.  The pressure transducer was placed on the FA sheath, but there was some mild dampening to the FA pressure.  Since the patient does have renal insufficiency with a creatinine of 2.7, decision was made not to perform left ventriculography.  An echo Doppler study did show ejection fraction greater than 55%.  An LV to AO pullback was then performed.  However, with an echo demonstrating possible moderate aortic insufficiency on prior echo, central aortography was done utilizing 20 mL of contrast at a flow rate of 20.  Attention was then directed at the coronary arteries.  A 5-French FL4 left and right catheters were inserted.  Intracoronary nitroglycerin 100 mcg was also selectively injected into the left coronary catheter to make certain his LAD stenosis did not represent spasm.  Dye load was minimized during the procedure.  Estimated dye load was approximately 50-60 mL.  Prior to taking the patient off the table, I did discuss the above findings with Dr. Alanda Amass, the patient's primary cardiologist.  In light of his significant coronary artery disease with his moderately severe aortic valve and age 78, the plan will  be probable AVR/CABG, and the console intervention was not performed.  HEMODYNAMIC DATA:  Right atrial pressure 6, right ventricular pressure 23/6, PA pressure 23/12, mean pulmonary capillary wedge pressure 11.  Initial central aortic pressure was 132/66.  Initial left ventricular pressure was 165/11 giving a peak-to-peak gradient of 33 mm.  On pullback, LV pressure was 164/11 and central aortic pressure was 139/68 giving a 25-mm peak-to-peak gradient.  O2 saturation in the aorta was 94% and in the pulmonary artery was 76%.  Cardiac output by the thermodilution method was 5.1, by the Fick method was 9.1, cardiac index was 2.7 and  4.8, respectively.  Aortic valve area is 1.1.  ANGIOGRAPHIC DATA:  Left main coronary artery was angiographically normal and trifurcated into a moderate-sized LAD system, a small intermediate vessel, and moderate-sized circumflex system.  The LAD gave rise to a proximal large diagonal vessel.  There was a tubular 70% stenosis just after the diagonal takeoff extending just proximal to the septal perforating artery.  Remainder of the LAD was free of significant disease.  Following IC nitroglycerin, there was slight improvement, but essentially this did remain a fixed obstruction.  The ramus intermedius vessel was small caliber and normal.  The circumflex vessel was normal and gave rise to one major marginal vessel and then a posterolateral like vessel.  The right coronary artery had smooth 30-40% proximal stenosis and just after the bend had 90% stenosis prior to an anterior RV marginal branch. This 90% stenosis was focal and concentric.  The remainder of the RCA was free of significant disease.  IMPRESSION: 1. Moderately severe aortic valvular stenosis with a peak-to-peak     gradient at 25-32 mm and calculated aortic valve area of 1.1 square     cm. 2. Normal right heart pressures without evidence for pulmonary     hypertension. 3. Two-vessel coronary artery disease with tubular smooth 70% stenosis     in the LAD after the first diagonal vessel and before the septal     perforating artery; and 30-40% proximal followed by 90% stenosis in     the right coronary artery prior to the anterior RV marginal branch.  The patient has already seen Dr. Kathlee Nations Trigt and is tentatively scheduled for aortic valve replacement surgery.  He is 78 years old. Recommend AVR/CABG revascularization.          ______________________________ Nicki Guadalajara, M.D.     TK/MEDQ  D:  02/13/2011  T:  02/13/2011  Job:  [id number]  cc:   Gerlene Burdock A. Alanda Amass, M.D. Kerin Perna, M.D. Duke Salvia  Eliott Nine, M.D. Marjory Lies, M.D.

## 2011-02-19 ENCOUNTER — Other Ambulatory Visit: Payer: Self-pay | Admitting: Cardiothoracic Surgery

## 2011-02-19 ENCOUNTER — Ambulatory Visit (INDEPENDENT_AMBULATORY_CARE_PROVIDER_SITE_OTHER): Payer: Medicare Other | Admitting: Cardiothoracic Surgery

## 2011-02-19 ENCOUNTER — Ambulatory Visit
Admission: RE | Admit: 2011-02-19 | Discharge: 2011-02-19 | Disposition: A | Discharge status: Home / Self Care | Payer: Medicare Other | Source: Ambulatory Visit | Attending: Cardiothoracic Surgery | Admitting: Cardiothoracic Surgery

## 2011-02-19 ENCOUNTER — Encounter: Payer: Self-pay | Admitting: Cardiothoracic Surgery

## 2011-02-19 VITALS — BP 141/79 | HR 70 | Resp 20 | Ht 67.0 in | Wt 168.0 lb

## 2011-02-19 DIAGNOSIS — I350 Nonrheumatic aortic (valve) stenosis: Secondary | ICD-10-CM

## 2011-02-19 DIAGNOSIS — I359 Nonrheumatic aortic valve disorder, unspecified: Secondary | ICD-10-CM

## 2011-02-19 NOTE — Patient Instructions (Signed)
Take metoprolol with a sip of water before leaving the house a.m. of surgery

## 2011-02-19 NOTE — Progress Notes (Signed)
HPI                     [address]            [name] [address]          [phone]     Current problems 1 severe aortic stenosis with class III CHF, PA pressures 23/12 cardiac output 5.1 wedge 11 2 two-vessel coronary artery disease, 90% RCA 70% LAD 3 history of intermittent atrial fibrillation controlled with amiodarone and chronic Coumadin 4 benign familial tremor 5 chronic renal insufficiency creatinine 2.0 status post left wrist AV fistula   Current Outpatient Prescriptions  Medication Sig Dispense Refill  . amiodarone (PACERONE) 200 MG tablet Take 200 mg by mouth daily.      Marland Kitchen amLODipine (NORVASC) 10 MG tablet Take 10 mg by mouth daily.      Marland Kitchen aspirin EC 81 MG tablet Take 81 mg by mouth daily.      . calcitRIOL (ROCALTROL) 0.25 MCG capsule Take 0.25 mcg by mouth daily.      . finasteride (PROSCAR) 5 MG tablet Take 5 mg by mouth daily.      . furosemide (LASIX) 40 MG tablet Take 80 mg by mouth daily.      . metoprolol tartrate (LOPRESSOR) 25 MG tablet Take 12.5 mg by mouth 2 (two) times daily.      . Multiple Vitamins-Minerals (OCUVITE PRESERVISION PO) Take 1 tablet by mouth daily.      Marland Kitchen rOPINIRole (REQUIP) 0.25 MG tablet Take 0.25 mg by mouth 3 (three) times daily.      Marland Kitchen warfarin (COUMADIN) 5 MG tablet Take 2.5-5 mg by mouth daily. Take 1 tablet on Monday,Wednesday,& Friday Take 0.5 tablet all other days         Review of Systems: No fever weight loss. Good appetite he remains strong. He walks daily. No complications from his cardiac cath last week, no groin hematoma no symptoms of upper respiratory infection   Physical Exam Vital signs blood pressure 140/80 pulse 70 regular sinus saturation 96% room air 168 BMI 26.3 General appearance pleasant elderly 78 year old gentleman no distress accompanied by wife HEENT normocephalic. We'll plates Thorax breath sounds clear Cardiac 3/6 systolic ejection murmur regular rhythm Abdomen soft  nontender Extremities warm minimal edema positive pulses Neurologic resting tremor normal gait  Diagnostic Tests: Chest x-ray done today shows no active disease no edema or pleural effusion   Impression: Severe aortic stenosis and two-vessel coronary disease scheduled for combined aVR-CABG x2 Monday February for a [place]. Procedure indications risks all reviewed with the patient and wife and he understands and agrees to proceed. Plan a tissue aVR   Plan

## 2011-02-20 ENCOUNTER — Other Ambulatory Visit: Payer: Self-pay

## 2011-02-20 ENCOUNTER — Encounter (HOSPITAL_COMMUNITY): Payer: Self-pay

## 2011-02-20 ENCOUNTER — Ambulatory Visit (HOSPITAL_COMMUNITY)
Admission: RE | Admit: 2011-02-20 | Discharge: 2011-02-20 | Disposition: A | Discharge status: Home / Self Care | Payer: Medicare Other | Source: Ambulatory Visit | Attending: Cardiothoracic Surgery | Admitting: Cardiothoracic Surgery

## 2011-02-20 ENCOUNTER — Inpatient Hospital Stay (HOSPITAL_COMMUNITY)
Admission: RE | Admit: 2011-02-20 | Discharge: 2011-02-20 | Disposition: A | Discharge status: Home / Self Care | Payer: Medicare Other | Source: Ambulatory Visit | Attending: Cardiothoracic Surgery | Admitting: Cardiothoracic Surgery

## 2011-02-20 ENCOUNTER — Encounter (HOSPITAL_COMMUNITY)
Admission: RE | Admit: 2011-02-20 | Discharge: 2011-02-20 | Disposition: A | Discharge status: Home / Self Care | Payer: Medicare Other | Source: Ambulatory Visit | Attending: Cardiothoracic Surgery | Admitting: Cardiothoracic Surgery

## 2011-02-20 DIAGNOSIS — I359 Nonrheumatic aortic valve disorder, unspecified: Secondary | ICD-10-CM

## 2011-02-20 DIAGNOSIS — I2510 Atherosclerotic heart disease of native coronary artery without angina pectoris: Secondary | ICD-10-CM

## 2011-02-20 DIAGNOSIS — Z01810 Encounter for preprocedural cardiovascular examination: Secondary | ICD-10-CM

## 2011-02-20 DIAGNOSIS — Z01818 Encounter for other preprocedural examination: Secondary | ICD-10-CM | POA: Insufficient documentation

## 2011-02-20 DIAGNOSIS — Z01812 Encounter for preprocedural laboratory examination: Secondary | ICD-10-CM | POA: Insufficient documentation

## 2011-02-20 HISTORY — DX: Disease of blood and blood-forming organs, unspecified: D75.9

## 2011-02-20 HISTORY — DX: Cardiac arrhythmia, unspecified: I49.9

## 2011-02-20 HISTORY — DX: Atherosclerotic heart disease of native coronary artery without angina pectoris: I25.10

## 2011-02-20 HISTORY — DX: Unspecified osteoarthritis, unspecified site: M19.90

## 2011-02-20 HISTORY — DX: Shortness of breath: R06.02

## 2011-02-20 LAB — CBC
HCT: 35 % — ABNORMAL LOW (ref 39.0–52.0)
Hemoglobin: 11.4 g/dL — ABNORMAL LOW (ref 13.0–17.0)
MCH: 27.6 pg (ref 26.0–34.0)
MCHC: 32.6 g/dL (ref 30.0–36.0)
MCV: 84.7 fL (ref 78.0–100.0)
Platelets: 119 10*3/uL — ABNORMAL LOW (ref 150–400)
RBC: 4.13 MIL/uL — ABNORMAL LOW (ref 4.22–5.81)
RDW: 15.9 % — ABNORMAL HIGH (ref 11.5–15.5)
WBC: 4.1 10*3/uL (ref 4.0–10.5)

## 2011-02-20 LAB — URINALYSIS, ROUTINE W REFLEX MICROSCOPIC
Bilirubin Urine: NEGATIVE
Glucose, UA: NEGATIVE mg/dL
Hgb urine dipstick: NEGATIVE
Ketones, ur: NEGATIVE mg/dL
Leukocytes, UA: NEGATIVE
Nitrite: NEGATIVE
Protein, ur: NEGATIVE mg/dL
Specific Gravity, Urine: 1.011 (ref 1.005–1.030)
Urobilinogen, UA: 0.2 mg/dL (ref 0.0–1.0)
pH: 6 (ref 5.0–8.0)

## 2011-02-20 LAB — COMPREHENSIVE METABOLIC PANEL WITH GFR
ALT: 9 U/L (ref 0–53)
AST: 18 U/L (ref 0–37)
Albumin: 3.6 g/dL (ref 3.5–5.2)
Alkaline Phosphatase: 67 U/L (ref 39–117)
BUN: 45 mg/dL — ABNORMAL HIGH (ref 6–23)
CO2: 19 meq/L (ref 19–32)
Calcium: 10.3 mg/dL (ref 8.4–10.5)
Chloride: 105 meq/L (ref 96–112)
Creatinine, Ser: 2.97 mg/dL — ABNORMAL HIGH (ref 0.50–1.35)
GFR calc Af Amer: 21 mL/min — ABNORMAL LOW
GFR calc non Af Amer: 18 mL/min — ABNORMAL LOW
Glucose, Bld: 99 mg/dL (ref 70–99)
Potassium: 4.3 meq/L (ref 3.5–5.1)
Sodium: 138 meq/L (ref 135–145)
Total Bilirubin: 0.5 mg/dL (ref 0.3–1.2)
Total Protein: 6.9 g/dL (ref 6.0–8.3)

## 2011-02-20 LAB — BLOOD GAS, ARTERIAL
Acid-base deficit: 0.4 mmol/L (ref 0.0–2.0)
Bicarbonate: 23.4 meq/L (ref 20.0–24.0)
Drawn by: 344381
FIO2: 0.21 %
O2 Saturation: 98 %
Patient temperature: 98.6
TCO2: 24.5 mmol/L (ref 0–100)
pCO2 arterial: 36.4 mmHg (ref 35.0–45.0)
pH, Arterial: 7.424 (ref 7.350–7.450)
pO2, Arterial: 104 mmHg — ABNORMAL HIGH (ref 80.0–100.0)

## 2011-02-20 LAB — APTT: aPTT: 29 seconds (ref 24–37)

## 2011-02-20 LAB — PROTIME-INR
INR: 1 (ref 0.00–1.49)
Prothrombin Time: 13.4 seconds (ref 11.6–15.2)

## 2011-02-20 LAB — SURGICAL PCR SCREEN
MRSA, PCR: NEGATIVE
Staphylococcus aureus: POSITIVE — AB

## 2011-02-20 LAB — PULMONARY FUNCTION TEST

## 2011-02-20 LAB — ABO/RH: ABO/RH(D): A POS

## 2011-02-20 NOTE — Progress Notes (Signed)
Office called pt. States he is having CABG and AVR,order for AVR on consent.  Will need consent signed DOS.\  Pacemaker order sheet faxed to [facility] and confirmation received.

## 2011-02-20 NOTE — Progress Notes (Signed)
Spoke with nurse in @ [facility] about EKG.She stated that pt. Pacemaker was checked  01-30-2011 in office and there was no problem. She said she would show this to Dr. Alanda Amass or Dr. Allyson Sabal.  Spoke with Geraldine Contras at [facility] said she had also placed a call to [facility] and was waiting for a return call.EKG also faxed to Dr. Donata Clay and confirmation received.

## 2011-02-20 NOTE — Progress Notes (Signed)
[facility] called requarding pt. Pre-op EKG.EKG faxed to office.  No return call from office.

## 2011-02-20 NOTE — Pre-Procedure Instructions (Signed)
20 Nathaniel Steele  02/20/2011   Your procedure is scheduled on: 02-24-2011  @ 7:30 AM  Report to [facility] at 5:30 AM.  Call this number if you have problems the morning of surgery: [phone]   Remember:   Do not eat food:After Midnight.  May have clear liquids: up to 4 Hours before arrival.  Clear liquids include soda, tea, black coffee, apple or grape juice, broth. 1:30 AM  Take these medicines the morning of surgery with A SIP OF WATER: Amiodarone,amlodipine,lopressor  Do not wear jewelry, make-up or nail polish.  Do not wear lotions, powders, or perfumes. You may wear deodorant.  Do not shave 48 hours prior to surgery.  Do not bring valuables to the hospital.  Contacts, dentures or bridgework may not be worn into surgery.  Leave suitcase in the car. After surgery it may be brought to your room.  For patients admitted to the hospital, checkout time is 11:00 AM the day of discharge.       Special Instructions: Incentive Spirometry - Practice and bring it with you on the day of surgery. and CHG Shower Use Special Wash: 1/2 bottle night before surgery and 1/2 bottle morning of surgery.   Please read over the following fact sheets that you were given: Open Heart Packet, MRSA Information and Surgical Site Infection Prevention

## 2011-02-20 NOTE — Progress Notes (Signed)
Spoke with Natalia Leatherwood at [facility] about EKG, stated that Dr. Alanda Amass looked at EKG and everything was all right.

## 2011-02-20 NOTE — Progress Notes (Addendum)
Pre-op Cardiac Surgery  Carotid Findings:  No ICA stenosis.  Vertebral artery flow is antegrade.  Upper Extremity Right Left  Brachial Pressures 174 T Fistula  Radial Waveforms T   Ulnar Waveforms T   Palmar Arch (Allen's Test) WNL    Findings:      Lower  Extremity Right Left  Dorsalis Pedis    Anterior Tibial    Posterior Tibial    Ankle/Brachial Indices      Findings:  Palpable pulses X 4

## 2011-02-21 LAB — HEMOGLOBIN A1C
Hgb A1c MFr Bld: 5.8 % — ABNORMAL HIGH (ref <5.7)
Mean Plasma Glucose: 120 mg/dL — ABNORMAL HIGH (ref <117)

## 2011-02-21 NOTE — Progress Notes (Signed)
Left message w/travis regarding device order receivrd from [facility],date & time surgery..[id number]

## 2011-02-23 MED ORDER — MOXIFLOXACIN HCL IN NACL 400 MG/250ML IV SOLN
400.0000 mg | INTRAVENOUS | Status: AC
  Administered 2011-02-24: 400 mg via INTRAVENOUS
  Filled 2011-02-23: qty 250

## 2011-02-23 MED ORDER — DOPAMINE-DEXTROSE 3.2-5 MG/ML-% IV SOLN
2.0000 ug/kg/min | INTRAVENOUS | Status: AC
  Administered 2011-02-24: 3 ug/kg/min via INTRAVENOUS
  Filled 2011-02-23: qty 250

## 2011-02-23 MED ORDER — METOPROLOL TARTRATE 12.5 MG HALF TABLET
12.5000 mg | ORAL_TABLET | Freq: Once | ORAL | Status: DC
  Filled 2011-02-23: qty 1

## 2011-02-23 MED ORDER — NITROGLYCERIN IN D5W 200-5 MCG/ML-% IV SOLN
2.0000 ug/min | INTRAVENOUS | Status: AC
  Administered 2011-02-24: 16.66 ug/min via INTRAVENOUS
  Filled 2011-02-23: qty 250

## 2011-02-23 MED ORDER — SODIUM CHLORIDE 0.9 % IV SOLN
INTRAVENOUS | Status: DC
  Filled 2011-02-23: qty 40

## 2011-02-23 MED ORDER — DEXTROSE 5 % IV SOLN
30.0000 ug/min | INTRAVENOUS | Status: DC
  Filled 2011-02-23: qty 2

## 2011-02-23 MED ORDER — EPINEPHRINE HCL 1 MG/ML IJ SOLN
0.5000 ug/min | INTRAVENOUS | Status: DC
  Filled 2011-02-23: qty 4

## 2011-02-23 MED ORDER — SODIUM CHLORIDE 0.9 % IV SOLN
INTRAVENOUS | Status: AC
  Administered 2011-02-24: 1 [IU]/h via INTRAVENOUS
  Filled 2011-02-23: qty 1

## 2011-02-23 MED ORDER — POTASSIUM CHLORIDE 2 MEQ/ML IV SOLN
80.0000 meq | INTRAVENOUS | Status: DC
  Filled 2011-02-23: qty 40

## 2011-02-23 MED ORDER — DEXMEDETOMIDINE HCL 100 MCG/ML IV SOLN
0.1000 ug/kg/h | INTRAVENOUS | Status: AC
  Administered 2011-02-24: .2 ug/kg/h via INTRAVENOUS
  Filled 2011-02-23: qty 4

## 2011-02-23 MED ORDER — CHLORHEXIDINE GLUCONATE 4 % EX LIQD: 30.0000 mL | CUTANEOUS | Status: DC

## 2011-02-23 MED ORDER — MAGNESIUM SULFATE 50 % IJ SOLN
40.0000 meq | INTRAMUSCULAR | Status: DC
  Filled 2011-02-23: qty 10

## 2011-02-23 MED ORDER — VANCOMYCIN HCL 1000 MG IV SOLR
1250.0000 mg | INTRAVENOUS | Status: AC
  Administered 2011-02-24: 1250 mg via INTRAVENOUS
  Filled 2011-02-23: qty 1250

## 2011-02-23 MED ORDER — VERAPAMIL HCL 2.5 MG/ML IV SOLN
INTRAVENOUS | Status: AC
  Administered 2011-02-24: 11:00:00
  Filled 2011-02-23: qty 2.5

## 2011-02-24 ENCOUNTER — Inpatient Hospital Stay (HOSPITAL_COMMUNITY)
Admission: RE | Admit: 2011-02-24 | Discharge: 2011-03-12 | DRG: 219 | Disposition: A | Discharge status: Skilled Nursing Facility | Payer: Medicare Other | Source: Ambulatory Visit | Attending: Cardiothoracic Surgery | Admitting: Cardiothoracic Surgery

## 2011-02-24 ENCOUNTER — Other Ambulatory Visit: Payer: Self-pay

## 2011-02-24 ENCOUNTER — Inpatient Hospital Stay (HOSPITAL_COMMUNITY): Payer: Medicare Other

## 2011-02-24 ENCOUNTER — Encounter (HOSPITAL_COMMUNITY)
Admission: RE | Disposition: A | Discharge status: Skilled Nursing Facility | Payer: Self-pay | Source: Ambulatory Visit | Attending: Cardiothoracic Surgery

## 2011-02-24 ENCOUNTER — Ambulatory Visit (HOSPITAL_COMMUNITY): Payer: Medicare Other | Admitting: Certified Registered Nurse Anesthetist

## 2011-02-24 ENCOUNTER — Other Ambulatory Visit: Payer: Self-pay | Admitting: Cardiothoracic Surgery

## 2011-02-24 ENCOUNTER — Encounter (HOSPITAL_COMMUNITY): Payer: Self-pay | Admitting: Certified Registered Nurse Anesthetist

## 2011-02-24 DIAGNOSIS — G250 Essential tremor: Secondary | ICD-10-CM | POA: Diagnosis present

## 2011-02-24 DIAGNOSIS — Z951 Presence of aortocoronary bypass graft: Secondary | ICD-10-CM

## 2011-02-24 DIAGNOSIS — N2581 Secondary hyperparathyroidism of renal origin: Secondary | ICD-10-CM | POA: Diagnosis present

## 2011-02-24 DIAGNOSIS — I2510 Atherosclerotic heart disease of native coronary artery without angina pectoris: Principal | ICD-10-CM

## 2011-02-24 DIAGNOSIS — I4891 Unspecified atrial fibrillation: Secondary | ICD-10-CM | POA: Diagnosis present

## 2011-02-24 DIAGNOSIS — J9690 Respiratory failure, unspecified, unspecified whether with hypoxia or hypercapnia: Secondary | ICD-10-CM | POA: Diagnosis not present

## 2011-02-24 DIAGNOSIS — Z880 Allergy status to penicillin: Secondary | ICD-10-CM

## 2011-02-24 DIAGNOSIS — I359 Nonrheumatic aortic valve disorder, unspecified: Secondary | ICD-10-CM

## 2011-02-24 DIAGNOSIS — I428 Other cardiomyopathies: Secondary | ICD-10-CM | POA: Diagnosis present

## 2011-02-24 DIAGNOSIS — I129 Hypertensive chronic kidney disease with stage 1 through stage 4 chronic kidney disease, or unspecified chronic kidney disease: Secondary | ICD-10-CM | POA: Diagnosis present

## 2011-02-24 DIAGNOSIS — D62 Acute posthemorrhagic anemia: Secondary | ICD-10-CM | POA: Diagnosis not present

## 2011-02-24 DIAGNOSIS — D696 Thrombocytopenia, unspecified: Secondary | ICD-10-CM | POA: Diagnosis not present

## 2011-02-24 DIAGNOSIS — Z7982 Long term (current) use of aspirin: Secondary | ICD-10-CM

## 2011-02-24 DIAGNOSIS — I509 Heart failure, unspecified: Secondary | ICD-10-CM | POA: Diagnosis present

## 2011-02-24 DIAGNOSIS — Z7901 Long term (current) use of anticoagulants: Secondary | ICD-10-CM

## 2011-02-24 DIAGNOSIS — Z79899 Other long term (current) drug therapy: Secondary | ICD-10-CM

## 2011-02-24 DIAGNOSIS — I4821 Permanent atrial fibrillation: Secondary | ICD-10-CM | POA: Diagnosis present

## 2011-02-24 DIAGNOSIS — N184 Chronic kidney disease, stage 4 (severe): Secondary | ICD-10-CM | POA: Diagnosis present

## 2011-02-24 DIAGNOSIS — N390 Urinary tract infection, site not specified: Secondary | ICD-10-CM | POA: Diagnosis not present

## 2011-02-24 DIAGNOSIS — R5381 Other malaise: Secondary | ICD-10-CM | POA: Diagnosis not present

## 2011-02-24 DIAGNOSIS — J95821 Acute postprocedural respiratory failure: Secondary | ICD-10-CM | POA: Diagnosis not present

## 2011-02-24 DIAGNOSIS — J449 Chronic obstructive pulmonary disease, unspecified: Secondary | ICD-10-CM | POA: Diagnosis present

## 2011-02-24 DIAGNOSIS — E876 Hypokalemia: Secondary | ICD-10-CM | POA: Diagnosis not present

## 2011-02-24 DIAGNOSIS — G252 Other specified forms of tremor: Secondary | ICD-10-CM | POA: Diagnosis present

## 2011-02-24 DIAGNOSIS — J4489 Other specified chronic obstructive pulmonary disease: Secondary | ICD-10-CM | POA: Diagnosis present

## 2011-02-24 DIAGNOSIS — R0902 Hypoxemia: Secondary | ICD-10-CM | POA: Diagnosis not present

## 2011-02-24 DIAGNOSIS — N186 End stage renal disease: Secondary | ICD-10-CM | POA: Diagnosis present

## 2011-02-24 DIAGNOSIS — Z950 Presence of cardiac pacemaker: Secondary | ICD-10-CM

## 2011-02-24 DIAGNOSIS — M329 Systemic lupus erythematosus, unspecified: Secondary | ICD-10-CM | POA: Diagnosis present

## 2011-02-24 HISTORY — PX: AORTIC VALVE REPLACEMENT: SHX41

## 2011-02-24 HISTORY — PX: AV FISTULA PLACEMENT: SHX1204

## 2011-02-24 HISTORY — PX: CORONARY ARTERY BYPASS GRAFT: SHX141

## 2011-02-24 LAB — POCT I-STAT, CHEM 8
BUN: 43 mg/dL — ABNORMAL HIGH (ref 6–23)
Calcium, Ion: 1.24 mmol/L (ref 1.12–1.32)
Chloride: 113 mEq/L — ABNORMAL HIGH (ref 96–112)
Creatinine, Ser: 2.3 mg/dL — ABNORMAL HIGH (ref 0.50–1.35)
Glucose, Bld: 131 mg/dL — ABNORMAL HIGH (ref 70–99)
HCT: 30 % — ABNORMAL LOW (ref 39.0–52.0)
Hemoglobin: 10.2 g/dL — ABNORMAL LOW (ref 13.0–17.0)
Potassium: 4.6 mEq/L (ref 3.5–5.1)
Sodium: 142 mEq/L (ref 135–145)
TCO2: 20 mmol/L (ref 0–100)

## 2011-02-24 LAB — POCT I-STAT 4, (NA,K, GLUC, HGB,HCT)
Glucose, Bld: 90 mg/dL (ref 70–99)
Glucose, Bld: 93 mg/dL (ref 70–99)
Glucose, Bld: 129 mg/dL — ABNORMAL HIGH (ref 70–99)
Glucose, Bld: 116 mg/dL — ABNORMAL HIGH (ref 70–99)
Glucose, Bld: 144 mg/dL — ABNORMAL HIGH (ref 70–99)
Glucose, Bld: 133 mg/dL — ABNORMAL HIGH (ref 70–99)
Glucose, Bld: 106 mg/dL — ABNORMAL HIGH (ref 70–99)
Glucose, Bld: 89 mg/dL (ref 70–99)
HCT: 33 % — ABNORMAL LOW (ref 39.0–52.0)
HCT: 32 % — ABNORMAL LOW (ref 39.0–52.0)
HCT: 33 % — ABNORMAL LOW (ref 39.0–52.0)
HCT: 26 % — ABNORMAL LOW (ref 39.0–52.0)
HCT: 29 % — ABNORMAL LOW (ref 39.0–52.0)
HCT: 27 % — ABNORMAL LOW (ref 39.0–52.0)
HCT: 26 % — ABNORMAL LOW (ref 39.0–52.0)
HCT: 23 % — ABNORMAL LOW (ref 39.0–52.0)
Hemoglobin: 11.2 g/dL — ABNORMAL LOW (ref 13.0–17.0)
Hemoglobin: 10.9 g/dL — ABNORMAL LOW (ref 13.0–17.0)
Hemoglobin: 11.2 g/dL — ABNORMAL LOW (ref 13.0–17.0)
Hemoglobin: 8.8 g/dL — ABNORMAL LOW (ref 13.0–17.0)
Hemoglobin: 9.9 g/dL — ABNORMAL LOW (ref 13.0–17.0)
Hemoglobin: 9.2 g/dL — ABNORMAL LOW (ref 13.0–17.0)
Hemoglobin: 8.8 g/dL — ABNORMAL LOW (ref 13.0–17.0)
Hemoglobin: 7.8 g/dL — ABNORMAL LOW (ref 13.0–17.0)
Potassium: 4.1 mEq/L (ref 3.5–5.1)
Potassium: 4 mEq/L (ref 3.5–5.1)
Potassium: 3.8 mEq/L (ref 3.5–5.1)
Potassium: 3.8 mEq/L (ref 3.5–5.1)
Potassium: 4.1 mEq/L (ref 3.5–5.1)
Potassium: 4.4 mEq/L (ref 3.5–5.1)
Potassium: 4.1 mEq/L (ref 3.5–5.1)
Potassium: 4.1 mEq/L (ref 3.5–5.1)
Sodium: 142 mEq/L (ref 135–145)
Sodium: 143 mEq/L (ref 135–145)
Sodium: 143 mEq/L (ref 135–145)
Sodium: 139 mEq/L (ref 135–145)
Sodium: 141 mEq/L (ref 135–145)
Sodium: 140 mEq/L (ref 135–145)
Sodium: 143 mEq/L (ref 135–145)
Sodium: 142 mEq/L (ref 135–145)

## 2011-02-24 LAB — POCT I-STAT 3, ART BLOOD GAS (G3+)
Acid-base deficit: 1 mmol/L (ref 0.0–2.0)
Acid-base deficit: 4 mmol/L — ABNORMAL HIGH (ref 0.0–2.0)
Acid-base deficit: 5 mmol/L — ABNORMAL HIGH (ref 0.0–2.0)
Acid-base deficit: 5 mmol/L — ABNORMAL HIGH (ref 0.0–2.0)
Bicarbonate: 24.6 mEq/L — ABNORMAL HIGH (ref 20.0–24.0)
Bicarbonate: 20.1 mEq/L (ref 20.0–24.0)
Bicarbonate: 21.1 mEq/L (ref 20.0–24.0)
Bicarbonate: 20.4 mEq/L (ref 20.0–24.0)
O2 Saturation: 100 %
O2 Saturation: 99 %
O2 Saturation: 91 %
O2 Saturation: 93 %
Patient temperature: 36.3
Patient temperature: 36.8
TCO2: 26 mmol/L (ref 0–100)
TCO2: 21 mmol/L (ref 0–100)
TCO2: 22 mmol/L (ref 0–100)
TCO2: 21 mmol/L (ref 0–100)
pCO2 arterial: 42.5 mmHg (ref 35.0–45.0)
pCO2 arterial: 32.1 mmHg — ABNORMAL LOW (ref 35.0–45.0)
pCO2 arterial: 39.1 mmHg (ref 35.0–45.0)
pCO2 arterial: 36.8 mmHg (ref 35.0–45.0)
pH, Arterial: 7.372 (ref 7.350–7.450)
pH, Arterial: 7.403 (ref 7.350–7.450)
pH, Arterial: 7.336 — ABNORMAL LOW (ref 7.350–7.450)
pH, Arterial: 7.351 (ref 7.350–7.450)
pO2, Arterial: 370 mmHg — ABNORMAL HIGH (ref 80.0–100.0)
pO2, Arterial: 155 mmHg — ABNORMAL HIGH (ref 80.0–100.0)
pO2, Arterial: 61 mmHg — ABNORMAL LOW (ref 80.0–100.0)
pO2, Arterial: 70 mmHg — ABNORMAL LOW (ref 80.0–100.0)

## 2011-02-24 LAB — PROTIME-INR
INR: 1.49 (ref 0.00–1.49)
Prothrombin Time: 18.3 seconds — ABNORMAL HIGH (ref 11.6–15.2)

## 2011-02-24 LAB — HEMOGLOBIN AND HEMATOCRIT, BLOOD
HCT: 28.5 % — ABNORMAL LOW (ref 39.0–52.0)
Hemoglobin: 9.4 g/dL — ABNORMAL LOW (ref 13.0–17.0)

## 2011-02-24 LAB — CREATININE, SERUM
Creatinine, Ser: 2.45 mg/dL — ABNORMAL HIGH (ref 0.50–1.35)
GFR calc Af Amer: 27 mL/min — ABNORMAL LOW (ref >90)
GFR calc non Af Amer: 23 mL/min — ABNORMAL LOW (ref >90)

## 2011-02-24 LAB — CBC
HCT: 25 % — ABNORMAL LOW (ref 39.0–52.0)
Hemoglobin: 8.1 g/dL — ABNORMAL LOW (ref 13.0–17.0)
MCH: 27.5 pg (ref 26.0–34.0)
MCHC: 32.4 g/dL (ref 30.0–36.0)
MCV: 84.7 fL (ref 78.0–100.0)
Platelets: 105 10*3/uL — ABNORMAL LOW (ref 150–400)
Platelets: 103 10*3/uL — ABNORMAL LOW (ref 150–400)
RBC: 2.95 MIL/uL — ABNORMAL LOW (ref 4.22–5.81)
RBC: 2.91 MIL/uL — ABNORMAL LOW (ref 4.22–5.81)
RDW: 16.2 % — ABNORMAL HIGH (ref 11.5–15.5)
WBC: 8.8 10*3/uL (ref 4.0–10.5)
WBC: 8.2 10*3/uL (ref 4.0–10.5)

## 2011-02-24 LAB — PLATELET COUNT: Platelets: 103 10*3/uL — ABNORMAL LOW (ref 150–400)

## 2011-02-24 LAB — MAGNESIUM: Magnesium: 3.2 mg/dL — ABNORMAL HIGH (ref 1.5–2.5)

## 2011-02-24 LAB — GLUCOSE, CAPILLARY
Glucose-Capillary: 91 mg/dL (ref 70–99)
Glucose-Capillary: 74 mg/dL (ref 70–99)
Glucose-Capillary: 90 mg/dL (ref 70–99)
Glucose-Capillary: 121 mg/dL — ABNORMAL HIGH (ref 70–99)

## 2011-02-24 LAB — APTT: aPTT: 40 seconds — ABNORMAL HIGH (ref 24–37)

## 2011-02-24 SURGERY — REPLACEMENT, AORTIC VALVE, OPEN
Anesthesia: General | Site: Chest | Wound class: Clean

## 2011-02-24 MED ORDER — MOXIFLOXACIN HCL IN NACL 400 MG/250ML IV SOLN
400.0000 mg | INTRAVENOUS | Status: AC
  Administered 2011-02-25 – 2011-02-26 (×2): 400 mg via INTRAVENOUS
  Filled 2011-02-24 (×2): qty 250

## 2011-02-24 MED ORDER — LIDOCAINE HCL (CARDIAC) 20 MG/ML IV SOLN
INTRAVENOUS | Status: DC | PRN
  Administered 2011-02-24: 100 mg via INTRAVENOUS

## 2011-02-24 MED ORDER — MUPIROCIN 2 % EX OINT
TOPICAL_OINTMENT | CUTANEOUS | Status: AC
  Administered 2011-02-24: 1 via NASAL
  Filled 2011-02-24: qty 22

## 2011-02-24 MED ORDER — PROTAMINE SULFATE 10 MG/ML IV SOLN
INTRAVENOUS | Status: DC | PRN
  Administered 2011-02-24 (×4): 50 mg via INTRAVENOUS

## 2011-02-24 MED ORDER — ALBUMIN HUMAN 5 % IV SOLN
INTRAVENOUS | Status: DC | PRN
  Administered 2011-02-24 (×2): via INTRAVENOUS

## 2011-02-24 MED ORDER — LACTATED RINGERS IV SOLN: INTRAVENOUS | Status: DC

## 2011-02-24 MED ORDER — VANCOMYCIN HCL 1000 MG IV SOLR
1000.0000 mg | Freq: Once | INTRAVENOUS | Status: AC
  Administered 2011-02-24: 1000 mg via INTRAVENOUS
  Filled 2011-02-24: qty 1000

## 2011-02-24 MED ORDER — SODIUM BICARBONATE 4.2 % IV SOLN
INTRAVENOUS | Status: DC | PRN
  Administered 2011-02-24: 25 mL via INTRAVENOUS

## 2011-02-24 MED ORDER — NITROGLYCERIN IN D5W 200-5 MCG/ML-% IV SOLN: 0.0000 ug/min | INTRAVENOUS | Status: DC

## 2011-02-24 MED ORDER — NITROGLYCERIN IN D5W 200-5 MCG/ML-% IV SOLN: 2.0000 ug/min | INTRAVENOUS | Status: DC

## 2011-02-24 MED ORDER — DEXMEDETOMIDINE HCL 100 MCG/ML IV SOLN
0.1000 ug/kg/h | INTRAVENOUS | Status: DC
  Filled 2011-02-24: qty 2

## 2011-02-24 MED ORDER — CALCIUM CHLORIDE 10 % IV SOLN
1.0000 g | Freq: Once | INTRAVENOUS | Status: AC | PRN
  Filled 2011-02-24: qty 10

## 2011-02-24 MED ORDER — METOPROLOL TARTRATE 1 MG/ML IV SOLN: 2.5000 mg | INTRAVENOUS | Status: DC | PRN

## 2011-02-24 MED ORDER — CHLORHEXIDINE GLUCONATE CLOTH 2 % EX PADS
6.0000 | MEDICATED_PAD | Freq: Every day | CUTANEOUS | Status: AC
  Administered 2011-02-25 – 2011-03-01 (×5): 6 via TOPICAL

## 2011-02-24 MED ORDER — FENTANYL CITRATE 0.05 MG/ML IJ SOLN
INTRAMUSCULAR | Status: DC | PRN
  Administered 2011-02-24: 100 ug via INTRAVENOUS
  Administered 2011-02-24: 50 ug via INTRAVENOUS
  Administered 2011-02-24: 1200 ug via INTRAVENOUS

## 2011-02-24 MED ORDER — BISACODYL 5 MG PO TBEC
10.0000 mg | DELAYED_RELEASE_TABLET | Freq: Every day | ORAL | Status: DC
  Administered 2011-02-26 – 2011-03-12 (×11): 10 mg via ORAL
  Filled 2011-02-24 (×11): qty 2

## 2011-02-24 MED ORDER — SODIUM CHLORIDE 0.9 % IJ SOLN: 3.0000 mL | INTRAMUSCULAR | Status: DC | PRN

## 2011-02-24 MED ORDER — INSULIN REGULAR BOLUS VIA INFUSION
0.0000 [IU] | Freq: Three times a day (TID) | INTRAVENOUS | Status: DC
  Administered 2011-02-24: 1.2 [IU]
  Filled 2011-02-24: qty 10

## 2011-02-24 MED ORDER — MORPHINE SULFATE 4 MG/ML IJ SOLN
2.0000 mg | INTRAMUSCULAR | Status: DC | PRN
  Administered 2011-02-25 (×3): 4 mg via INTRAVENOUS
  Administered 2011-02-25: 2 mg via INTRAVENOUS
  Filled 2011-02-24 (×4): qty 1

## 2011-02-24 MED ORDER — SODIUM CHLORIDE 0.9 % IV SOLN
INTRAVENOUS | Status: DC
  Filled 2011-02-24: qty 1

## 2011-02-24 MED ORDER — SODIUM CHLORIDE 0.9 % IV SOLN
250.0000 mL | INTRAVENOUS | Status: DC
  Administered 2011-03-02: 250 mL via INTRAVENOUS

## 2011-02-24 MED ORDER — MUPIROCIN 2 % EX OINT
1.0000 "application " | TOPICAL_OINTMENT | Freq: Two times a day (BID) | CUTANEOUS | Status: AC
  Administered 2011-02-24 – 2011-03-01 (×10): 1 via NASAL
  Filled 2011-02-24: qty 22

## 2011-02-24 MED ORDER — SODIUM CHLORIDE 0.9 % IJ SOLN
3.0000 mL | Freq: Two times a day (BID) | INTRAMUSCULAR | Status: DC
  Administered 2011-02-25: 30 mL via INTRAVENOUS
  Administered 2011-02-25: 3 mL via INTRAVENOUS

## 2011-02-24 MED ORDER — 0.9 % SODIUM CHLORIDE (POUR BTL) OPTIME
TOPICAL | Status: DC | PRN
  Administered 2011-02-24: 1000 mL

## 2011-02-24 MED ORDER — FAMOTIDINE IN NACL 20-0.9 MG/50ML-% IV SOLN
20.0000 mg | Freq: Two times a day (BID) | INTRAVENOUS | Status: AC
  Administered 2011-02-24 (×2): 20 mg via INTRAVENOUS
  Filled 2011-02-24: qty 50

## 2011-02-24 MED ORDER — DEXTROSE 5 % IV SOLN
0.0000 ug/min | INTRAVENOUS | Status: DC
  Administered 2011-02-24: 60 ug/min via INTRAVENOUS
  Administered 2011-02-25: 75 ug/min via INTRAVENOUS
  Administered 2011-02-25: 40 ug/min via INTRAVENOUS
  Filled 2011-02-24 (×5): qty 2

## 2011-02-24 MED ORDER — SODIUM BICARBONATE 8.4 % IV SOLN
50.0000 meq | Freq: Once | INTRAVENOUS | Status: AC
  Administered 2011-02-24: 50 meq via INTRAVENOUS
  Filled 2011-02-24: qty 50

## 2011-02-24 MED ORDER — LACTATED RINGERS IV SOLN
INTRAVENOUS | Status: DC | PRN
  Administered 2011-02-24: 07:00:00 via INTRAVENOUS

## 2011-02-24 MED ORDER — CALCIUM CHLORIDE 10 % IV SOLN
INTRAVENOUS | Status: DC | PRN
  Administered 2011-02-24: 0.5 g via INTRAVENOUS
  Administered 2011-02-24: .2 g via INTRAVENOUS
  Administered 2011-02-24: 0.5 g via INTRAVENOUS

## 2011-02-24 MED ORDER — HEMOSTATIC AGENTS (NO CHARGE) OPTIME
TOPICAL | Status: DC | PRN
  Administered 2011-02-24: 1 via TOPICAL

## 2011-02-24 MED ORDER — INSULIN ASPART 100 UNIT/ML ~~LOC~~ SOLN
0.0000 [IU] | SUBCUTANEOUS | Status: AC
  Administered 2011-02-24 (×2): 2 [IU] via SUBCUTANEOUS
  Filled 2011-02-24: qty 3

## 2011-02-24 MED ORDER — ROCURONIUM BROMIDE 100 MG/10ML IV SOLN
INTRAVENOUS | Status: DC | PRN
  Administered 2011-02-24: 50 mg via INTRAVENOUS

## 2011-02-24 MED ORDER — BISACODYL 10 MG RE SUPP
10.0000 mg | Freq: Every day | RECTAL | Status: DC
  Administered 2011-02-25: 10 mg via RECTAL
  Filled 2011-02-24: qty 1

## 2011-02-24 MED ORDER — ACETAMINOPHEN 160 MG/5ML PO SOLN
975.0000 mg | Freq: Four times a day (QID) | ORAL | Status: DC
  Filled 2011-02-24: qty 40.6

## 2011-02-24 MED ORDER — SODIUM CHLORIDE 0.9 % IV SOLN
INTRAVENOUS | Status: DC | PRN
  Administered 2011-02-24: 07:00:00 via INTRAVENOUS

## 2011-02-24 MED ORDER — PROPOFOL 10 MG/ML IV EMUL
INTRAVENOUS | Status: DC | PRN
  Administered 2011-02-24: 60 mg via INTRAVENOUS

## 2011-02-24 MED ORDER — SODIUM CHLORIDE 0.9 % IV SOLN
10.0000 g | INTRAVENOUS | Status: DC | PRN
  Administered 2011-02-24: 5 g/h via INTRAVENOUS
  Administered 2011-02-24: 13:00:00 via INTRAVENOUS

## 2011-02-24 MED ORDER — METOPROLOL TARTRATE 12.5 MG HALF TABLET
12.5000 mg | ORAL_TABLET | Freq: Two times a day (BID) | ORAL | Status: DC
  Administered 2011-02-26 – 2011-03-05 (×13): 12.5 mg via ORAL
  Filled 2011-02-24 (×21): qty 1

## 2011-02-24 MED ORDER — ACETAMINOPHEN 650 MG RE SUPP
650.0000 mg | RECTAL | Status: AC
  Administered 2011-02-24: 650 mg via RECTAL

## 2011-02-24 MED ORDER — HEMOSTATIC AGENTS (NO CHARGE) OPTIME
TOPICAL | Status: DC | PRN
  Administered 2011-02-24: 2 via TOPICAL

## 2011-02-24 MED ORDER — SODIUM CHLORIDE 0.45 % IV SOLN
INTRAVENOUS | Status: DC
  Administered 2011-02-24: 20 mL via INTRAVENOUS
  Administered 2011-02-25: 20:00:00 via INTRAVENOUS

## 2011-02-24 MED ORDER — HEMOSTATIC AGENTS (NO CHARGE) OPTIME
TOPICAL | Status: DC | PRN
  Administered 2011-02-24: 3 via TOPICAL

## 2011-02-24 MED ORDER — ACETAMINOPHEN 160 MG/5ML PO SOLN: 650.0000 mg | ORAL | Status: AC

## 2011-02-24 MED ORDER — SODIUM CHLORIDE 0.9 % IV SOLN
0.1000 ug/kg/h | INTRAVENOUS | Status: DC
  Filled 2011-02-24: qty 4

## 2011-02-24 MED ORDER — PANTOPRAZOLE SODIUM 40 MG PO TBEC
40.0000 mg | DELAYED_RELEASE_TABLET | Freq: Every day | ORAL | Status: DC
Start: 2011-02-26 — End: 2011-03-12
  Administered 2011-02-26 – 2011-03-12 (×13): 40 mg via ORAL
  Filled 2011-02-24 (×14): qty 1

## 2011-02-24 MED ORDER — DOCUSATE SODIUM 100 MG PO CAPS
200.0000 mg | ORAL_CAPSULE | Freq: Every day | ORAL | Status: DC
  Administered 2011-02-26 – 2011-03-12 (×15): 200 mg via ORAL
  Filled 2011-02-24 (×14): qty 2

## 2011-02-24 MED ORDER — DOPAMINE-DEXTROSE 3.2-5 MG/ML-% IV SOLN
2.5000 ug/kg/min | INTRAVENOUS | Status: AC
  Filled 2011-02-24: qty 250

## 2011-02-24 MED ORDER — ONDANSETRON HCL 4 MG/2ML IJ SOLN
4.0000 mg | Freq: Four times a day (QID) | INTRAMUSCULAR | Status: DC | PRN
  Administered 2011-02-24 – 2011-02-25 (×2): 4 mg via INTRAVENOUS
  Filled 2011-02-24 (×2): qty 2

## 2011-02-24 MED ORDER — MIDAZOLAM HCL 5 MG/5ML IJ SOLN
INTRAMUSCULAR | Status: DC | PRN
  Administered 2011-02-24: 1 mg via INTRAVENOUS
  Administered 2011-02-24: 5 mg via INTRAVENOUS
  Administered 2011-02-24: 9 mg via INTRAVENOUS

## 2011-02-24 MED ORDER — VECURONIUM BROMIDE 10 MG IV SOLR
INTRAVENOUS | Status: DC | PRN
  Administered 2011-02-24 (×2): 10 mg via INTRAVENOUS

## 2011-02-24 MED ORDER — INSULIN REGULAR HUMAN 100 UNIT/ML IJ SOLN
INTRAMUSCULAR | Status: DC
  Administered 2011-02-24: 0.4 [IU]/h via INTRAVENOUS
  Filled 2011-02-24: qty 1

## 2011-02-24 MED ORDER — MORPHINE SULFATE 2 MG/ML IJ SOLN: 1.0000 mg | INTRAMUSCULAR | Status: AC | PRN

## 2011-02-24 MED ORDER — METOPROLOL TARTRATE 25 MG/10 ML ORAL SUSPENSION
12.5000 mg | Freq: Two times a day (BID) | ORAL | Status: DC
  Filled 2011-02-24 (×19): qty 5

## 2011-02-24 MED ORDER — ACETAMINOPHEN 500 MG PO TABS
1000.0000 mg | ORAL_TABLET | Freq: Four times a day (QID) | ORAL | Status: DC
  Administered 2011-02-25 (×2): 1000 mg via ORAL
  Filled 2011-02-24 (×6): qty 2

## 2011-02-24 MED ORDER — INSULIN ASPART 100 UNIT/ML ~~LOC~~ SOLN
0.0000 [IU] | SUBCUTANEOUS | Status: DC
  Administered 2011-02-25 – 2011-02-26 (×5): 2 [IU] via SUBCUTANEOUS

## 2011-02-24 MED ORDER — ALBUMIN HUMAN 5 % IV SOLN
250.0000 mL | INTRAVENOUS | Status: AC | PRN
  Administered 2011-02-24 (×4): 250 mL via INTRAVENOUS
  Filled 2011-02-24: qty 250

## 2011-02-24 MED ORDER — SODIUM CHLORIDE 0.9 % IV SOLN
INTRAVENOUS | Status: DC
  Administered 2011-02-24 (×2): via INTRAVENOUS

## 2011-02-24 MED ORDER — DOPAMINE-DEXTROSE 3.2-5 MG/ML-% IV SOLN: 2.0000 ug/kg/min | INTRAVENOUS | Status: DC

## 2011-02-24 MED ORDER — HEPARIN SODIUM (PORCINE) 1000 UNIT/ML IJ SOLN
INTRAMUSCULAR | Status: DC | PRN
  Administered 2011-02-24: 5000 [IU] via INTRAVENOUS
  Administered 2011-02-24: 16000 [IU] via INTRAVENOUS

## 2011-02-24 MED ORDER — MIDAZOLAM HCL 2 MG/2ML IJ SOLN: 2.0000 mg | INTRAMUSCULAR | Status: DC | PRN

## 2011-02-24 MED ORDER — PHENYLEPHRINE HCL 10 MG/ML IJ SOLN
20.0000 mg | INTRAVENOUS | Status: DC | PRN
  Administered 2011-02-24: 20 ug/min via INTRAVENOUS

## 2011-02-24 MED ORDER — SODIUM CHLORIDE 0.9 % IV SOLN
5.0000 g | INTRAVENOUS | Status: DC
  Filled 2011-02-24: qty 20

## 2011-02-24 SURGICAL SUPPLY — 154 items
ADAPTER CARDIO PERF ANTE/RETRO (ADAPTER) ×3 IMPLANT
APPLIER CLIP 9.375 MED OPEN (MISCELLANEOUS)
APPLIER CLIP 9.375 SM OPEN (CLIP)
ARTERIAL PRESSURE LINE (MISCELLANEOUS) ×6 IMPLANT
ATTRACTOMAT 16X20 MAGNETIC DRP (DRAPES) ×3 IMPLANT
BAG DECANTER FOR FLEXI CONT (MISCELLANEOUS) ×3 IMPLANT
BANDAGE ACE 4 STERILE (GAUZE/BANDAGES/DRESSINGS) ×3 IMPLANT
BANDAGE ELASTIC 4 VELCRO ST LF (GAUZE/BANDAGES/DRESSINGS) ×3 IMPLANT
BANDAGE ELASTIC 6 VELCRO ST LF (GAUZE/BANDAGES/DRESSINGS) ×3 IMPLANT
BANDAGE GAUZE ELAST BULKY 4 IN (GAUZE/BANDAGES/DRESSINGS) ×3 IMPLANT
BASKET HEART  (ORDER IN 25'S) (MISCELLANEOUS) ×1
BASKET HEART (ORDER IN 25'S) (MISCELLANEOUS) ×1
BASKET HEART (ORDER IN 25S) (MISCELLANEOUS) ×1 IMPLANT
BLADE SAW STERNAL (BLADE) ×3 IMPLANT
BLADE SURG 11 STRL SS (BLADE) ×3 IMPLANT
BLADE SURG 12 STRL SS (BLADE) ×3 IMPLANT
BLADE SURG 15 STRL LF DISP TIS (BLADE) ×1 IMPLANT
BLADE SURG 15 STRL SS (BLADE) ×2
BLADE SURG ROTATE 9660 (MISCELLANEOUS) IMPLANT
CANISTER SUCTION 2500CC (MISCELLANEOUS) ×3 IMPLANT
CANNULA ARTERIAL NVNT 3/8 20FR (MISCELLANEOUS) ×3 IMPLANT
CANNULA GUNDRY RCSP 15FR (MISCELLANEOUS) ×3 IMPLANT
CATH CPB KIT VANTRIGT (MISCELLANEOUS) ×3 IMPLANT
CATH RETROPLEGIA CORONARY 14FR (CATHETERS) ×3 IMPLANT
CATH ROBINSON RED A/P 18FR (CATHETERS) ×15 IMPLANT
CATH THORACIC 28FR (CATHETERS) IMPLANT
CATH THORACIC 28FR RT ANG (CATHETERS) IMPLANT
CATH THORACIC 36FR (CATHETERS) IMPLANT
CATH THORACIC 36FR RT ANG (CATHETERS) ×6 IMPLANT
CLIP APPLIE 9.375 MED OPEN (MISCELLANEOUS) IMPLANT
CLIP APPLIE 9.375 SM OPEN (CLIP) IMPLANT
CLIP FOGARTY SPRING 6M (CLIP) IMPLANT
CLIP TI MEDIUM 24 (CLIP) IMPLANT
CLIP TI WIDE RED SMALL 24 (CLIP) ×6 IMPLANT
CLOTH BEACON ORANGE TIMEOUT ST (SAFETY) ×3 IMPLANT
CONN Y 3/8X3/8X3/8  BEN (MISCELLANEOUS)
CONN Y 3/8X3/8X3/8 BEN (MISCELLANEOUS) IMPLANT
CONT SPECI 4OZ STER CLIK (MISCELLANEOUS) ×3 IMPLANT
COVER SURGICAL LIGHT HANDLE (MISCELLANEOUS) ×6 IMPLANT
CRADLE DONUT ADULT HEAD (MISCELLANEOUS) ×3 IMPLANT
DRAIN CHANNEL 32F RND 10.7 FF (WOUND CARE) ×3 IMPLANT
DRAPE CARDIOVASCULAR INCISE (DRAPES) ×2
DRAPE SLUSH MACHINE 52X66 (DRAPES) IMPLANT
DRAPE SLUSH/WARMER DISC (DRAPES) ×3 IMPLANT
DRAPE SRG 135X102X78XABS (DRAPES) ×1 IMPLANT
DRSG COVADERM 4X14 (GAUZE/BANDAGES/DRESSINGS) ×3 IMPLANT
ELECT BLADE 4.0 EZ CLEAN MEGAD (MISCELLANEOUS) ×3
ELECT BLADE 6.5 EXT (BLADE) ×3 IMPLANT
ELECT CAUTERY BLADE 6.4 (BLADE) ×3 IMPLANT
ELECT REM PT RETURN 9FT ADLT (ELECTROSURGICAL) ×6
ELECTRODE BLDE 4.0 EZ CLN MEGD (MISCELLANEOUS) ×1 IMPLANT
ELECTRODE REM PT RTRN 9FT ADLT (ELECTROSURGICAL) ×2 IMPLANT
GAUZE KERLIX 2  STERILE LF (GAUZE/BANDAGES/DRESSINGS) ×3 IMPLANT
GAUZE SPONGE 4X4 12PLY STRL LF (GAUZE/BANDAGES/DRESSINGS) ×3 IMPLANT
GAUZE XEROFORM 5X9 LF (GAUZE/BANDAGES/DRESSINGS) ×3 IMPLANT
GLOVE BIO SURGEON STRL SZ 6 (GLOVE) ×18 IMPLANT
GLOVE BIO SURGEON STRL SZ 6.5 (GLOVE) IMPLANT
GLOVE BIO SURGEON STRL SZ7 (GLOVE) IMPLANT
GLOVE BIO SURGEON STRL SZ7.5 (GLOVE) ×6 IMPLANT
GLOVE BIO SURGEONS STRL SZ 6.5 (GLOVE)
GLOVE BIOGEL PI IND STRL 6 (GLOVE) ×2 IMPLANT
GLOVE BIOGEL PI IND STRL 6.5 (GLOVE) IMPLANT
GLOVE BIOGEL PI IND STRL 7.0 (GLOVE) ×8 IMPLANT
GLOVE BIOGEL PI INDICATOR 6 (GLOVE) ×4
GLOVE BIOGEL PI INDICATOR 6.5 (GLOVE)
GLOVE BIOGEL PI INDICATOR 7.0 (GLOVE) ×16
GLOVE EUDERMIC 7 POWDERFREE (GLOVE) IMPLANT
GLOVE ORTHO TXT STRL SZ7.5 (GLOVE) IMPLANT
GOWN STRL NON-REIN LRG LVL3 (GOWN DISPOSABLE) ×15 IMPLANT
HEMOSTAT POWDER SURGIFOAM 1G (HEMOSTASIS) ×12 IMPLANT
HEMOSTAT SURGICEL 2X14 (HEMOSTASIS) ×3 IMPLANT
INSERT FOGARTY 61MM (MISCELLANEOUS) IMPLANT
INSERT FOGARTY XLG (MISCELLANEOUS) IMPLANT
KIT BASIN OR (CUSTOM PROCEDURE TRAY) ×3 IMPLANT
KIT ROOM TURNOVER OR (KITS) ×3 IMPLANT
KIT SUCTION CATH 14FR (SUCTIONS) ×3 IMPLANT
KIT VASOVIEW W/TROCAR VH 2000 (KITS) ×3 IMPLANT
LEAD PACING MYOCARDI (MISCELLANEOUS) ×3 IMPLANT
LINE VENT (MISCELLANEOUS) ×3 IMPLANT
MARKER GRAFT CORONARY BYPASS (MISCELLANEOUS) ×9 IMPLANT
MATRIX HEMOSTAT SURGIFLO (HEMOSTASIS) ×6 IMPLANT
NEEDLE 27GAX1X1/2 (NEEDLE) ×3 IMPLANT
NEEDLE HYPO 22GX1.5 SAFETY (NEEDLE) ×3 IMPLANT
NS IRRIG 1000ML POUR BTL (IV SOLUTION) ×18 IMPLANT
PACK OPEN HEART (CUSTOM PROCEDURE TRAY) ×3 IMPLANT
PAD ARMBOARD 7.5X6 YLW CONV (MISCELLANEOUS) ×6 IMPLANT
PENCIL BUTTON HOLSTER BLD 10FT (ELECTRODE) ×3 IMPLANT
PUNCH AORTIC ROTATE 4.0MM (MISCELLANEOUS) IMPLANT
PUNCH AORTIC ROTATE 4.5MM 8IN (MISCELLANEOUS) ×3 IMPLANT
PUNCH AORTIC ROTATE 5MM 8IN (MISCELLANEOUS) IMPLANT
SET CARDIOPLEGIA MPS 5001102 (MISCELLANEOUS) ×3 IMPLANT
SOLUTION ANTI FOG 6CC (MISCELLANEOUS) IMPLANT
SPONGE GAUZE 4X4 12PLY (GAUZE/BANDAGES/DRESSINGS) ×6 IMPLANT
SPONGE INTESTINAL PEANUT (DISPOSABLE) IMPLANT
SPONGE LAP 18X18 X RAY DECT (DISPOSABLE) ×6 IMPLANT
SPONGE LAP 4X18 X RAY DECT (DISPOSABLE) IMPLANT
STOPCOCK 4 WAY LG BORE MALE ST (IV SETS) ×3 IMPLANT
SUT BONE WAX W31G (SUTURE) ×3 IMPLANT
SUT ETHIBON 2 0 V 52N 30 (SUTURE) ×9 IMPLANT
SUT ETHIBON EXCEL 2-0 V-5 (SUTURE) IMPLANT
SUT ETHIBOND 2 0 SH (SUTURE)
SUT ETHIBOND 2 0 SH 36X2 (SUTURE) IMPLANT
SUT ETHIBOND 2 0 V4 (SUTURE) IMPLANT
SUT ETHIBOND 2 0V4 GREEN (SUTURE) IMPLANT
SUT ETHIBOND 4 0 RB 1 (SUTURE) IMPLANT
SUT ETHIBOND V-5 VALVE (SUTURE) IMPLANT
SUT MNCRL AB 4-0 PS2 18 (SUTURE) ×3 IMPLANT
SUT PROLENE 3 0 RB 1 (SUTURE) ×3 IMPLANT
SUT PROLENE 3 0 SH 1 (SUTURE) IMPLANT
SUT PROLENE 3 0 SH DA (SUTURE) IMPLANT
SUT PROLENE 3 0 SH1 36 (SUTURE) IMPLANT
SUT PROLENE 4 0 RB 1 (SUTURE) ×16
SUT PROLENE 4 0 SH DA (SUTURE) ×3 IMPLANT
SUT PROLENE 4-0 RB1 .5 CRCL 36 (SUTURE) ×6 IMPLANT
SUT PROLENE 4-0 RB1 18X2 ARM (SUTURE) ×2 IMPLANT
SUT PROLENE 5 0 C 1 36 (SUTURE) IMPLANT
SUT PROLENE 6 0 C 1 30 (SUTURE) ×6 IMPLANT
SUT PROLENE 6 0 CC (SUTURE) IMPLANT
SUT PROLENE 7 0 BV 1 (SUTURE) IMPLANT
SUT PROLENE 7 0 BV1 MDA (SUTURE) IMPLANT
SUT PROLENE 7 0 DA (SUTURE) IMPLANT
SUT PROLENE 7.0 RB 3 (SUTURE) ×9 IMPLANT
SUT PROLENE 8 0 BV175 6 (SUTURE) IMPLANT
SUT PROLENE BLUE 7 0 (SUTURE) ×3 IMPLANT
SUT PROLENE POLY MONO (SUTURE) IMPLANT
SUT SILK  1 MH (SUTURE)
SUT SILK 1 MH (SUTURE) IMPLANT
SUT SILK 2 0 SH CR/8 (SUTURE) ×3 IMPLANT
SUT SILK 3 0 SH CR/8 (SUTURE) IMPLANT
SUT STEEL 6MS V (SUTURE) ×6 IMPLANT
SUT STEEL STERNAL CCS#1 18IN (SUTURE) IMPLANT
SUT STEEL SZ 6 DBL 3X14 BALL (SUTURE) ×3 IMPLANT
SUT VIC AB 1 CTX 18 (SUTURE) IMPLANT
SUT VIC AB 1 CTX 36 (SUTURE) ×6
SUT VIC AB 1 CTX36XBRD ANBCTR (SUTURE) ×3 IMPLANT
SUT VIC AB 2-0 CT1 27 (SUTURE) ×2
SUT VIC AB 2-0 CT1 TAPERPNT 27 (SUTURE) ×1 IMPLANT
SUT VIC AB 2-0 CTX 27 (SUTURE) IMPLANT
SUT VIC AB 3-0 SH 27 (SUTURE)
SUT VIC AB 3-0 SH 27X BRD (SUTURE) IMPLANT
SUT VIC AB 3-0 X1 27 (SUTURE) IMPLANT
SUT VICRYL 4-0 PS2 18IN ABS (SUTURE) IMPLANT
SUTURE E-PAK OPEN HEART (SUTURE) ×3 IMPLANT
SYR 10ML KIT SKIN ADHESIVE (MISCELLANEOUS) IMPLANT
SYSTEM SAHARA CHEST DRAIN ATS (WOUND CARE) ×3 IMPLANT
TAPE CLOTH SURG 4X10 WHT LF (GAUZE/BANDAGES/DRESSINGS) ×3 IMPLANT
TOWEL OR 17X24 6PK STRL BLUE (TOWEL DISPOSABLE) ×6 IMPLANT
TOWEL OR 17X26 10 PK STRL BLUE (TOWEL DISPOSABLE) ×6 IMPLANT
TRAY CATH LUMEN 1 20CM STRL (SET/KITS/TRAYS/PACK) ×3 IMPLANT
TRAY FOLEY IC TEMP SENS 16FR (CATHETERS) ×3 IMPLANT
TUBING INSUFFLATION 10FT LAP (TUBING) ×3 IMPLANT
UNDERPAD 30X30 INCONTINENT (UNDERPADS AND DIAPERS) ×3 IMPLANT
VALVE MAGNA EASE AORTIC 23MM (Prosthesis & Implant Heart) ×3 IMPLANT
WATER STERILE IRR 1000ML POUR (IV SOLUTION) ×6 IMPLANT

## 2011-02-24 NOTE — Progress Notes (Signed)
DR MASSAGEE MADE AWARE OF PT ABNORMAL H+H, BUN CREAT ELEVATED , PATIENT NOT ON HD YET. DR MASSAGEE STATED WE DID NOT NEED ISTAT.

## 2011-02-24 NOTE — Anesthesia Preprocedure Evaluation (Addendum)
Anesthesia Evaluation  Patient identified by MRN, date of birth, ID band Patient awake    Reviewed: Allergy & Precautions, H&P , NPO status , Patient's Chart, lab work & pertinent test results, reviewed documented beta blocker date and time   Airway Mallampati: II TM Distance: >3 FB Neck ROM: Full    Dental  (+) Edentulous Upper and Edentulous Lower   Pulmonary    Pulmonary exam normal       Cardiovascular hypertension, Pt. on medications and Pt. on home beta blockers + CAD + dysrhythmias Atrial Fibrillation + pacemaker + Valvular Problems/Murmurs AS  Intermittent Afib, a-pacing permanent pacemaker. On amiodarone and coumadin at home.  1st degree AV block noted on EKG and cariology notes.    Neuro/Psych    GI/Hepatic negative GI ROS, Neg liver ROS,   Endo/Other  Negative Endocrine ROS  Renal/GU CRFRenal disease  Genitourinary negative   Musculoskeletal  (+) Arthritis -, Rheumatoid disorders,    Abdominal Normal abdominal exam  (+)   Peds  Hematology   Anesthesia Other Findings   Reproductive/Obstetrics                         Anesthesia Physical Anesthesia Plan  ASA: III  Anesthesia Plan: General   Post-op Pain Management:    Induction: Intravenous  Airway Management Planned: Oral ETT  Additional Equipment: Arterial line, PA Cath and TEE  Intra-op Plan:   Post-operative Plan: Post-operative intubation/ventilation  Informed Consent: I have reviewed the patients History and Physical, chart, labs and discussed the procedure including the risks, benefits and alternatives for the proposed anesthesia with the patient or authorized representative who has indicated his/her understanding and acceptance.   Dental advisory given  Plan Discussed with: Surgeon and CRNA  Anesthesia Plan Comments:        Anesthesia Quick Evaluation

## 2011-02-24 NOTE — OR Nursing (Signed)
First call made to 2300, and to volunteer for off pump time @1330 

## 2011-02-24 NOTE — Progress Notes (Signed)
The patient was examined and preop studies reviewed. There has been no change from the prior exam and the patient is ready for surgery.  Plan AVR - CABG this am

## 2011-02-24 NOTE — Progress Notes (Signed)
Patient ID: Nathaniel Steele, male   DOB: [birth year], 78 y.o.   MRN: [id number]   Filed Vitals:   02/24/11 2045 02/24/11 2049 02/24/11 2100 02/24/11 2111  BP:  97/54 82/61 90/50   Pulse: 91 91 91 91  Temp: 98.1 F (36.7 C)  98.2 F (36.8 C)   TempSrc:      Resp: 22 11 12 16   Height:      Weight:      SpO2: 99% 96% 97% 96%   Dopamine 3, hemodynamics stable Ready for extubation  Urine output good  CT output low  CBC    Component Value Date/Time   WBC 8.8 02/24/2011 1525   RBC 2.95* 02/24/2011 1525   HGB 8.1* 02/24/2011 1525   HCT 25.0* 02/24/2011 1525   PLT 105* 02/24/2011 1525   MCV 84.7 02/24/2011 1525   MCH 27.5 02/24/2011 1525   MCHC 32.4 02/24/2011 1525   RDW 16.2* 02/24/2011 1525   LYMPHSABS 0.7 02/23/2007 1415   MONOABS 0.5 02/23/2007 1415   EOSABS 0.1 02/23/2007 1415   BASOSABS 0.0 02/23/2007 1415    BMET    Component Value Date/Time   NA 142 02/24/2011 1520   K 4.1 02/24/2011 1520   CL 105 02/20/2011 1407   CO2 19 02/20/2011 1407   GLUCOSE 89 02/24/2011 1520   BUN 45* 02/20/2011 1407   CREATININE 2.97* 02/20/2011 1407   CALCIUM 10.3 02/20/2011 1407   GFRNONAA 18* 02/20/2011 1407   GFRAA 21* 02/20/2011 1407    A/P: stable postop course

## 2011-02-24 NOTE — Progress Notes (Signed)
NOTIFIED DR MASSAGEE OF PATIENT UNABLE TO REMOVE WEDDING BAND DUR TO ARTHRITIS, DR MASSAGEE ASKED THAT I NOTIFY FRONT DESK . SPOKE WITH RHONDA AT OR DESK, WHO WILL NOTIFY ROOM AND NURSES.

## 2011-02-24 NOTE — Anesthesia Procedure Notes (Signed)
Procedure Name: Intubation Date/Time: 02/24/2011 8:03 AM Performed by: Delbert Harness Pre-anesthesia Checklist: Patient identified, Timeout performed, Emergency Drugs available, Suction available and Patient being monitored Patient Re-evaluated:Patient Re-evaluated prior to inductionOxygen Delivery Method: Circle System Utilized Preoxygenation: Pre-oxygenation with 100% oxygen Intubation Type: IV induction Ventilation: Mask ventilation without difficulty and Oral airway inserted - appropriate to patient size Laryngoscope Size: Mac and 4 Grade View: Grade I Tube type: Oral Tube size: 8.0 mm Number of attempts: 1 Airway Equipment and Method: stylet Placement Confirmation: ETT inserted through vocal cords under direct vision,  positive ETCO2 and breath sounds checked- equal and bilateral Secured at: 23 cm Tube secured with: Tape Dental Injury: Teeth and Oropharynx as per pre-operative assessment

## 2011-02-24 NOTE — Transfer of Care (Signed)
Immediate Anesthesia Transfer of Care Note  Patient: Nathaniel Steele  Procedure(s) Performed:  AORTIC VALVE REPLACEMENT (AVR); CORONARY ARTERY BYPASS GRAFTING (CABG) - Coranary artery bypass times two using left internal mammary artery and right greater saphenous vein  Patient Location: ICU  Anesthesia Type: General  Level of Consciousness: sedated and Patient remains intubated per anesthesia plan  Airway & Oxygen Therapy: Patient remains intubated per anesthesia plan and Patient placed on Ventilator (see vital sign flow sheet for setting)  Post-op Assessment: Report given to PACU RN and Post -op Vital signs reviewed and stable  Post vital signs: Reviewed and stable  Complications: No apparent anesthesia complications

## 2011-02-24 NOTE — Brief Op Note (Signed)
02/24/2011  3:03 PM  PATIENT:  Nathaniel Steele  78 y.o. male  PRE-OPERATIVE DIAGNOSIS:  Aortic stenosis, CAD  POST-OPERATIVE DIAGNOSIS:  Aortic stenosis, CAD  PROCEDURE:  Procedure(s): AORTIC VALVE REPLACEMENT (AVR)  23mm pericardial                  CORONARY ARTERY BYPASS GRAFTING (CABG) x 2 (LIMA-LAD  SVG - RCA)  SURGEON:  Surgeon(s): Mikey Bussing, MD  PHYSICIAN ASSISTANT:Ronnie Scott  ASSISTANTS: none   ANESTHESIA:   GEN  EBL:  Total I/O In: 1447 [Blood:1197; IV Piggyback:250] Out: 1450 [Urine:550; Blood:900]  BLOOD ADMINISTERED:  DRAINS:    LOCAL MEDICATIONS USED:   SPECIMEN:    DISPOSITION OF SPECIMEN:    COUNTS:    TOURNIQUET:   DICTATION: .  PLAN OF CARE: Admit to inpatient   PATIENT DISPOSITION:  ICU - intubated and hemodynamically stable.   Delay start of Pharmacological VTE agent (>24hrs) due to surgical blood loss or risk of bleeding:yes

## 2011-02-24 NOTE — OR Nursing (Signed)
Second call made to 2300 @1405 

## 2011-02-24 NOTE — Preoperative (Signed)
Beta Blockers   Reason not to administer Beta Blockers:Not Applicable, took metoprolol this am 

## 2011-02-24 NOTE — Progress Notes (Signed)
*  PRELIMINARY RESULTS* Echocardiogram Echocardiogram Transesophageal has been performed.  Katheren Puller 02/24/2011, 8:46 AM

## 2011-02-24 NOTE — Procedures (Signed)
Extubation Procedure Note  Patient Details:   Name: Nathaniel Steele DOB: 1926-02-08 MRN: [id number]   Airway Documentation:  Airway 8 mm (Active)  Secured at (cm) 24 cm 02/24/2011  9:11 PM  Measured From Lips 02/24/2011  9:11 PM  Secured Location Center 02/24/2011  9:11 PM  Secured By [employer] 02/24/2011  9:11 PM  Tube Holder Repositioned Yes 02/24/2011  8:49 PM  Cuff Pressure (cm H2O) 24 cm H2O 02/24/2011  8:49 PM  Site Condition Dry 02/24/2011  9:11 PM    Evaluation  O2 sats: stable throughout Complications: No apparent complications Patient did tolerate procedure well. Bilateral Breath Sounds: Clear   Yes  [name] 02/24/2011, 9:54 PM  Patient was weaned, performed SBT, coached on deep breathing, cough, and was extubated to a 4 L nasal cannula.  No evidence of stridor present.

## 2011-02-24 NOTE — OR Nursing (Signed)
On the way call to 2300@1458 

## 2011-02-25 ENCOUNTER — Encounter (HOSPITAL_COMMUNITY): Payer: Self-pay | Admitting: Cardiothoracic Surgery

## 2011-02-25 ENCOUNTER — Inpatient Hospital Stay (HOSPITAL_COMMUNITY): Payer: Medicare Other

## 2011-02-25 ENCOUNTER — Other Ambulatory Visit: Payer: Self-pay

## 2011-02-25 DIAGNOSIS — Z951 Presence of aortocoronary bypass graft: Secondary | ICD-10-CM

## 2011-02-25 LAB — POCT I-STAT 3, ART BLOOD GAS (G3+)
Acid-base deficit: 1 mmol/L (ref 0.0–2.0)
Acid-base deficit: 2 mmol/L (ref 0.0–2.0)
Acid-base deficit: 2 mmol/L (ref 0.0–2.0)
Acid-base deficit: 2 mmol/L (ref 0.0–2.0)
Acid-base deficit: 4 mmol/L — ABNORMAL HIGH (ref 0.0–2.0)
Acid-base deficit: 5 mmol/L — ABNORMAL HIGH (ref 0.0–2.0)
Bicarbonate: 20.4 mEq/L (ref 20.0–24.0)
Bicarbonate: 21.3 mEq/L (ref 20.0–24.0)
Bicarbonate: 23 mEq/L (ref 20.0–24.0)
Bicarbonate: 23 mEq/L (ref 20.0–24.0)
Bicarbonate: 23.1 mEq/L (ref 20.0–24.0)
Bicarbonate: 24.1 mEq/L — ABNORMAL HIGH (ref 20.0–24.0)
O2 Saturation: 89 %
O2 Saturation: 90 %
O2 Saturation: 91 %
O2 Saturation: 92 %
O2 Saturation: 93 %
O2 Saturation: 94 %
Patient temperature: 37
Patient temperature: 37.1
Patient temperature: 37.2
Patient temperature: 37.2
Patient temperature: 37.3
Patient temperature: 98.9
TCO2: 21 mmol/L (ref 0–100)
TCO2: 22 mmol/L (ref 0–100)
TCO2: 24 mmol/L (ref 0–100)
TCO2: 24 mmol/L (ref 0–100)
TCO2: 24 mmol/L (ref 0–100)
TCO2: 25 mmol/L (ref 0–100)
pCO2 arterial: 36.6 mmHg (ref 35.0–45.0)
pCO2 arterial: 39.5 mmHg (ref 35.0–45.0)
pCO2 arterial: 41 mmHg (ref 35.0–45.0)
pCO2 arterial: 41.3 mmHg (ref 35.0–45.0)
pCO2 arterial: 41.4 mmHg (ref 35.0–45.0)
pCO2 arterial: 41.5 mmHg (ref 35.0–45.0)
pH, Arterial: 7.341 — ABNORMAL LOW (ref 7.350–7.450)
pH, Arterial: 7.353 (ref 7.350–7.450)
pH, Arterial: 7.353 (ref 7.350–7.450)
pH, Arterial: 7.354 (ref 7.350–7.450)
pH, Arterial: 7.355 (ref 7.350–7.450)
pH, Arterial: 7.376 (ref 7.350–7.450)
pO2, Arterial: 59 mmHg — ABNORMAL LOW (ref 80.0–100.0)
pO2, Arterial: 61 mmHg — ABNORMAL LOW (ref 80.0–100.0)
pO2, Arterial: 64 mmHg — ABNORMAL LOW (ref 80.0–100.0)
pO2, Arterial: 66 mmHg — ABNORMAL LOW (ref 80.0–100.0)
pO2, Arterial: 73 mmHg — ABNORMAL LOW (ref 80.0–100.0)
pO2, Arterial: 77 mmHg — ABNORMAL LOW (ref 80.0–100.0)

## 2011-02-25 LAB — CREATININE, SERUM
Creatinine, Ser: 3.07 mg/dL — ABNORMAL HIGH (ref 0.50–1.35)
GFR calc Af Amer: 20 mL/min — ABNORMAL LOW (ref >90)
GFR calc non Af Amer: 17 mL/min — ABNORMAL LOW (ref >90)

## 2011-02-25 LAB — PREPARE FRESH FROZEN PLASMA: Unit division: 0

## 2011-02-25 LAB — BASIC METABOLIC PANEL
BUN: 47 mg/dL — ABNORMAL HIGH (ref 6–23)
CO2: 22 mEq/L (ref 19–32)
Calcium: 8.7 mg/dL (ref 8.4–10.5)
Chloride: 109 mEq/L (ref 96–112)
Creatinine, Ser: 2.6 mg/dL — ABNORMAL HIGH (ref 0.50–1.35)
GFR calc Af Amer: 25 mL/min — ABNORMAL LOW (ref >90)
GFR calc non Af Amer: 21 mL/min — ABNORMAL LOW (ref >90)
Glucose, Bld: 118 mg/dL — ABNORMAL HIGH (ref 70–99)
Potassium: 4.3 mEq/L (ref 3.5–5.1)
Sodium: 142 mEq/L (ref 135–145)

## 2011-02-25 LAB — GLUCOSE, CAPILLARY
Glucose-Capillary: 129 mg/dL — ABNORMAL HIGH (ref 70–99)
Glucose-Capillary: 107 mg/dL — ABNORMAL HIGH (ref 70–99)
Glucose-Capillary: 118 mg/dL — ABNORMAL HIGH (ref 70–99)
Glucose-Capillary: 138 mg/dL — ABNORMAL HIGH (ref 70–99)
Glucose-Capillary: 129 mg/dL — ABNORMAL HIGH (ref 70–99)
Glucose-Capillary: 140 mg/dL — ABNORMAL HIGH (ref 70–99)
Glucose-Capillary: 122 mg/dL — ABNORMAL HIGH (ref 70–99)

## 2011-02-25 LAB — POCT I-STAT, CHEM 8
BUN: 52 mg/dL — ABNORMAL HIGH (ref 6–23)
Calcium, Ion: 1.23 mmol/L (ref 1.12–1.32)
Chloride: 111 mEq/L (ref 96–112)
Creatinine, Ser: 2.9 mg/dL — ABNORMAL HIGH (ref 0.50–1.35)
Glucose, Bld: 140 mg/dL — ABNORMAL HIGH (ref 70–99)
HCT: 21 % — ABNORMAL LOW (ref 39.0–52.0)
Hemoglobin: 7.1 g/dL — ABNORMAL LOW (ref 13.0–17.0)
Potassium: 4.7 mEq/L (ref 3.5–5.1)
Sodium: 144 mEq/L (ref 135–145)
TCO2: 24 mmol/L (ref 0–100)

## 2011-02-25 LAB — CBC
HCT: 22 % — ABNORMAL LOW (ref 39.0–52.0)
HCT: 21.8 % — ABNORMAL LOW (ref 39.0–52.0)
Hemoglobin: 7.1 g/dL — ABNORMAL LOW (ref 13.0–17.0)
Hemoglobin: 7.3 g/dL — ABNORMAL LOW (ref 13.0–17.0)
MCH: 28.1 pg (ref 26.0–34.0)
MCH: 28.7 pg (ref 26.0–34.0)
MCHC: 32.3 g/dL (ref 30.0–36.0)
MCHC: 33.5 g/dL (ref 30.0–36.0)
MCV: 87 fL (ref 78.0–100.0)
MCV: 85.8 fL (ref 78.0–100.0)
Platelets: 81 10*3/uL — ABNORMAL LOW (ref 150–400)
Platelets: 98 10*3/uL — ABNORMAL LOW (ref 150–400)
RBC: 2.53 MIL/uL — ABNORMAL LOW (ref 4.22–5.81)
RBC: 2.54 MIL/uL — ABNORMAL LOW (ref 4.22–5.81)
RDW: 16.3 % — ABNORMAL HIGH (ref 11.5–15.5)
RDW: 16.3 % — ABNORMAL HIGH (ref 11.5–15.5)
WBC: 7.8 10*3/uL (ref 4.0–10.5)
WBC: 7.9 10*3/uL (ref 4.0–10.5)

## 2011-02-25 LAB — PROTIME-INR
INR: 1.52 — ABNORMAL HIGH (ref 0.00–1.49)
Prothrombin Time: 18.6 seconds — ABNORMAL HIGH (ref 11.6–15.2)

## 2011-02-25 LAB — PREPARE PLATELET PHERESIS: Unit division: 0

## 2011-02-25 LAB — MAGNESIUM
Magnesium: 3.2 mg/dL — ABNORMAL HIGH (ref 1.5–2.5)
Magnesium: 3.2 mg/dL — ABNORMAL HIGH (ref 1.5–2.5)

## 2011-02-25 LAB — PREPARE RBC (CROSSMATCH)

## 2011-02-25 MED ORDER — SODIUM BICARBONATE 8.4 % IV SOLN
50.0000 meq | Freq: Once | INTRAVENOUS | Status: AC
  Administered 2011-02-25: 50 meq via INTRAVENOUS
  Filled 2011-02-25: qty 50

## 2011-02-25 MED ORDER — FUROSEMIDE 10 MG/ML IJ SOLN
80.0000 mg | Freq: Once | INTRAMUSCULAR | Status: AC
  Administered 2011-02-25: 80 mg via INTRAVENOUS
  Filled 2011-02-25: qty 8

## 2011-02-25 MED ORDER — ACETAMINOPHEN 10 MG/ML IV SOLN: 1000.0000 mg | Freq: Four times a day (QID) | INTRAVENOUS | Status: DC

## 2011-02-25 MED ORDER — PHENYLEPHRINE HCL 10 MG/ML IJ SOLN
30.0000 ug/min | INTRAVENOUS | Status: DC
  Administered 2011-02-25: 30 ug/min via INTRAVENOUS
  Filled 2011-02-25: qty 4

## 2011-02-25 MED ORDER — ACETAMINOPHEN 10 MG/ML IV SOLN
1000.0000 mg | Freq: Four times a day (QID) | INTRAVENOUS | Status: AC
  Administered 2011-02-25 – 2011-02-26 (×4): 1000 mg via INTRAVENOUS
  Filled 2011-02-25 (×4): qty 100

## 2011-02-25 MED ORDER — FUROSEMIDE 10 MG/ML IJ SOLN
80.0000 mg | Freq: Two times a day (BID) | INTRAMUSCULAR | Status: DC
  Administered 2011-02-25 – 2011-02-26 (×4): 80 mg via INTRAVENOUS
  Filled 2011-02-25 (×6): qty 8

## 2011-02-25 NOTE — Progress Notes (Signed)
Patient ID: Nathaniel Steele, male   DOB: 1926/02/23, 78 y.o.   MRN: [id number] BP 86/54  Pulse 88  Temp(Src) 99 F (37.2 C) (Core (Comment))  Resp 16  Ht 5\' 7"  (1.702 m)  Wt 179 lb 14.3 oz (81.6 kg)  BMI 28.18 kg/m2  SpO2 93%  SpO2 Readings from Last 3 Encounters:  02/25/11 93%  02/25/11 93%  02/20/11 95%   Lab Results  Component Value Date   WBC 7.9 02/25/2011   HGB 7.3* 02/25/2011   HCT 21.8* 02/25/2011   PLT 98* 02/25/2011   GLUCOSE 140* 02/25/2011   CHOL  Value: 95        ATP III CLASSIFICATION:  <200     mg/dL   Desirable  829-562  mg/dL   Borderline High  >=130    mg/dL   High 08/26/5782   TRIG 52 06/29/2006   HDL 35* 06/29/2006   LDLCALC  Value: 50        Total Cholesterol/HDL:CHD Risk Coronary Heart Disease Risk Table                     Men   Women  1/2 Average Risk   3.4   3.3 06/29/2006   ALT 9 02/20/2011   AST 18 02/20/2011   NA 144 02/25/2011   K 4.7 02/25/2011   CL 111 02/25/2011   CREATININE 3.07* 02/25/2011   BUN 52* 02/25/2011   CO2 22 02/25/2011   TSH 0.249 Test methodology is 3rd generation TSH 06/28/2006   INR 1.52* 02/25/2011   HGBA1C 5.8* 02/20/2011   Off BIPAP now did not tolerate , vomited Tenuous resp status Delight Ovens MD  Beeper [phone] Office [phone] 02/25/2011 6:54 PM

## 2011-02-25 NOTE — Progress Notes (Signed)
Performed oral care per protocol.  Pt vomited thin, brown secretions after mask was replaced.  Pt O2 saturation dropped to 78% while mask was only off a few seconds.  Gave pt 4mg  Zofran and will continue to monitor.

## 2011-02-25 NOTE — Progress Notes (Signed)
  Echocardiogram 2D Echocardiogram has been performed.  Ketzia Guzek, Real Cons 02/25/2011, 3:45 PM

## 2011-02-25 NOTE — Anesthesia Postprocedure Evaluation (Signed)
  Anesthesia Post-op Note  Patient: Nathaniel Steele  Procedure(s) Performed:  AORTIC VALVE REPLACEMENT (AVR); CORONARY ARTERY BYPASS GRAFTING (CABG) - Coranary artery bypass times two using left internal mammary artery and right greater saphenous vein  Patient Location: SICU  Anesthesia Type: General  Level of Consciousness: alert   Airway and Oxygen Therapy: Patient Spontanous Breathing and Pt requiring BiPap post extubation. SpO2 96.  RR 22.  Post-op Pain: moderate  Post-op Assessment: Post-op Vital signs reviewed, Patient's Cardiovascular Status Stable, Respiratory Function Stable and Patent Airway  Post-op Vital Signs: Reviewed and stable  Complications: No apparent anesthesia complications and Pt stable but requiring Phenylephrine, plts, and BiPap

## 2011-02-25 NOTE — Evaluation (Signed)
Physical Therapy Evaluation Patient Details Name: Nathaniel Steele MRN: [id number] DOB: 1926/05/01 Today's Date: 02/25/2011  Problem List:  Patient Active Problem List  Diagnoses  . Chronic renal failure  . SLE (systemic lupus erythematosus)  . Aortic stenosis  . 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 Aortic valve replaced:    Past Medical History:  Past Medical History  Diagnosis Date  . Shortness of breath with exertion  . Chronic kidney disease     not on dialysis yet  . Coronary artery disease   . Hypertension   . Arthritis   . Blood dyscrasia     one time had low platlet count  . Dysrhythmia     atrial fib   Past Surgical History:  Past Surgical History  Procedure Date  . Back surgery   . Appendectomy   . Hernia repair   . Pacemaker insertion     2006  . Av fistula placement     PT Assessment/Plan/Recommendation PT Assessment Clinical Impression Statement: Nathaniel Steele 78 y/o male s/p AVR and CABGx2 POD #1. Some difficulty with respiratory status following extubation requring bi-pap and now partial rebreather. Limited PT eval performed today because of arterial BP dropping to 60-70s/30-40s just when elevating the head of his bed. Pt symptomatic with this. Will f/u tomorrow to progress with further mobility. It appears Nathaniel Steele was fairly active prior to procedure and will likely progress well with therapy once more medically stable. Pt will benefit physical therapy in the acute setting to address the following problem list and to maximize mobility and independence so as to increase safety upond d/c home. D/c recommendations TBD. PT Recommendation/Assessment: Patient will need skilled PT in the acute care venue PT Problem List: Decreased strength;Decreased activity tolerance;Decreased mobility;Cardiopulmonary status limiting activity;Pain PT Therapy Diagnosis : Generalized weakness;Acute pain PT Plan PT Treatment/Interventions: DME instruction;Therapeutic  exercise;Stair training;Gait training;Functional mobility training;Neuromuscular re-education;Balance training;Therapeutic activities;Patient/family education PT Recommendation Recommendations for Other Services: OT consult Follow Up Recommendations:  (TBD) Equipment Recommended:  (TBD) PT Goals  Acute Rehab PT Goals PT Goal Formulation: With patient Time For Goal Achievement: 2 weeks Pt will Roll Supine to Right Side: with supervision PT Goal: Rolling Supine to Right Side - Progress: Goal set today Pt will Roll Supine to Left Side: with supervision PT Goal: Rolling Supine to Left Side - Progress: Goal set today Pt will go Supine/Side to Sit: with supervision PT Goal: Supine/Side to Sit - Progress: Goal set today Pt will Sit at Edge of Bed: with supervision PT Goal: Sit at Edge Of Bed - Progress: Goal set today Pt will go Sit to Supine/Side: with supervision PT Goal: Sit to Supine/Side - Progress: Goal set today Pt will go Sit to Stand: with supervision PT Goal: Sit to Stand - Progress: Goal set today Pt will go Stand to Sit: with supervision PT Goal: Stand to Sit - Progress: Goal set today Pt will Transfer Bed to Chair/Chair to Bed: with supervision PT Transfer Goal: Bed to Chair/Chair to Bed - Progress: Goal set today Pt will Ambulate: >150 feet;with supervision;with least restrictive assistive device PT Goal: Ambulate - Progress: Goal set today  PT Evaluation Precautions/Restrictions  Precautions Precautions: Sternal Precaution Comments: swan ganz catheter Prior Functioning  Home Living Lives With: Spouse Receives Help From: Family Type of Home: House Home Layout: One level Prior Function Level of Independence: Independent with basic ADLs;Independent with homemaking with ambulation;Independent with gait;Independent with transfers Cognition Cognition Arousal/Alertness: Awake/alert Overall Cognitive  Status: Appears within functional limits for tasks assessed Orientation  Level: Oriented X4 Sensation/Coordination Sensation Light Touch: Appears Intact Coordination Gross Motor Movements are Fluid and Coordinated: Yes Fine Motor Movements are Fluid and Coordinated: Yes Extremity Assessment RLE Assessment RLE Assessment:  (pt moving bil LE freely in bed grossly 3-4/5) LLE Assessment LLE Assessment:  (see RLE) Mobility (including Balance) Bed Mobility Bed Mobility: Yes Rolling Left: 1: +2 Total assist (30%) Rolling Left Details (indicate cue type and reason): pt able to bend RLE to assist in initiating trunk rotation for roll; pt left positioned on left side Supine to Sit: Other (comment) Supine to Sit Details (indicate cue type and reason): initiated supine->sit by elevating HOB to see if pt able to tolerate position; pt able with minA to initiate scooting legs off bed; as soon as pt at approx 60 degrees HOB his arterial BP started to progressively drop until it was in upper 60s systolic and 30s diastolic; because of this we laid pt's head back down and BP jumped back to 133/62 (resting BP); pt symptomatic with orthostatics with just elevating HOB so transfer was terminated Scooting to [facility]: 1: +2 Total assist (0%) Scooting to [facility] Details (indicate cue type and reason): use of pad to scoot HOB Transfers Transfers: No Ambulation/Gait Ambulation/Gait: No Stairs: No Wheelchair Mobility Wheelchair Mobility: No  Balance Balance Assessed: No Exercise  General Exercises - Lower Extremity Ankle Circles/Pumps: AROM;Both;10 reps;Supine Heel Slides: AROM;Both;5 reps;Supine End of Session PT - End of Session Equipment Utilized During Treatment: Gait belt Activity Tolerance: Treatment limited secondary to medical complications (Comment) (pt orthostatic, unable to tolerate upright position) Patient left: in bed;with call bell in reach General Behavior During Session: [facility] for tasks performed Cognition: [facility] for tasks performed  [facility] HELEN 02/25/2011, 3:01  PM

## 2011-02-25 NOTE — Progress Notes (Signed)
Cm Nathaniel Steele [phone] 02/25/2011

## 2011-02-25 NOTE — Clinical Documentation Improvement (Signed)
CKD DOCUMENTATION CLARIFICATION QUERY   THIS DOCUMENT IS NOT A PERMANENT PART OF THE MEDICAL RECORD   Please update your documentation within the medical record to reflect your response to this query.                                                                                         02/25/11   Dr. Donata Clay and/or Associates,  In a better effort to capture your patient's severity of illness, reflect appropriate length of stay and utilization of resources, a review of the patient medical record has revealed the following indicators.    Based on your clinical judgment, please document the STAGE of the patient's chronic kidney disease in the progress notes and discharge summary:  CKD Stage II - GFR 60-80 CKD Stage III - GFR 30-59 CKD Stage IV - GFR 15-29 CKD Stage V - GFR < 15 Other Condition_____________ Unable to Clinically Determine  Clinical Information:  "Chronic Kidney Disease" documented in Surgical Consult Note 02/12/11  BUN Range 02/13/11 - 02/25/11 43 - 56  Creat Range 02/13/11 - 02/25/11 2.30 - 2.97  GFR Range 02/13/11 - 02/25/11 23 - 18   In responding to this query please exercise your independent judgment.  The fact that a query is asked, does not imply that any particular answer is desired or expected.   Reviewed: query documented in renal consult by dr. Caryn Section 02/26/11.  Mathis Dad RN   Thank You,  Jerral Ralph  RN BSN Certified Clinical Documentation Specialist: Cell   [phone]  Health Information Management    TO RESPOND TO THE THIS QUERY, FOLLOW THE INSTRUCTIONS BELOW:  1. If needed, update documentation for the patient's encounter via the notes activity.  2. Access this query again and click edit on the In [employer].  3. After updating, or not, click F2 to complete all highlighted (required) fields concerning your review. Select "additional documentation in the medical record" OR "no additional documentation  provided".  4. Click Sign note button.  5. The deficiency will fall out of your In Basket *Please let us know if you are not able to complete this workflow by phone or e-mail (listed below).

## 2011-02-25 NOTE — Progress Notes (Signed)
[facility]  Subjective: Pt on Bipap currently.    Objective: Vital signs in last 24 hours: Temp:  [97.2 F (36.2 C)-99.3 F (37.4 C)] 98.8 F (37.1 C) (02/05 0900) Pulse Rate:  [65-94] 88  (02/05 0900) Resp:  [1-27] 15  (02/05 0900) BP: (75-122)/(45-69) 93/65 mmHg (02/05 0900) SpO2:  [87 %-100 %] 96 % (02/05 0900) Arterial Line BP: (75-126)/(41-61) 112/56 mmHg (02/05 0900) FiO2 (%):  [39.6 %-100 %] 60 % (02/05 0800) Weight:  [76.6 kg (168 lb 14 oz)-81.6 kg (179 lb 14.3 oz)] 81.6 kg (179 lb 14.3 oz) (02/05 0630)    Intake/Output from previous day: 02/04 0701 - 02/05 0700 In: 7094 [I.V.:3497; JXBJY:7829; IV Piggyback:1700] Out: 2870 [Urine:1040; Blood:900; Chest Tube:930] Intake/Output this shift: Total I/O In: 398.6 [I.V.:148.6; IV Piggyback:250] Out: 300 [Urine:200; Chest Tube:100]  Medications Current Facility-Administered Medications  Medication Dose Route Frequency Provider Last Rate Last Dose  . 0.45 % sodium chloride infusion   Intravenous Continuous Kathlee Nations Trigt III, MD 20 mL/hr at 02/25/11 0600    . 0.9 %  sodium chloride infusion   Intravenous Continuous Kathlee Nations Trigt III, MD 20 mL/hr at 02/25/11 0800 20 mL at 02/25/11 0800  . 0.9 %  sodium chloride infusion  250 mL Intravenous Continuous Kathlee Nations Trigt III, MD      . acetaminophen (TYLENOL) solution 650 mg  650 mg Per Tube NOW Kathlee Nations Trigt III, MD       Or  . acetaminophen (TYLENOL) suppository 650 mg  650 mg Rectal NOW Kathlee Nations Trigt III, MD   650 mg at 02/24/11 1544  . acetaminophen (TYLENOL) tablet 1,000 mg  1,000 mg Oral Q6H Kathlee Nations Trigt III, MD   1,000 mg at 02/25/11 5621   Or  . acetaminophen (TYLENOL) solution 975 mg  975 mg Per Tube Q6H Kathlee Nations Trigt III, MD      . albumin human 5 % solution 250 mL  250 mL Intravenous Q15 min PRN Kathlee Nations Trigt III, MD   250 mL at 02/24/11 2331  . bisacodyl (DULCOLAX) EC tablet 10 mg  10 mg Oral Daily Kathlee Nations Trigt III, MD        Or  . bisacodyl (DULCOLAX) suppository 10 mg  10 mg Rectal Daily Kathlee Nations Trigt III, MD      . calcium chloride injection 1 g  1 g Intravenous Once PRN Kathlee Nations Trigt III, MD      . Chlorhexidine Gluconate Cloth 2 % PADS 6 each  6 each Topical Daily Kathlee Nations Trigt III, MD      . docusate sodium (COLACE) capsule 200 mg  200 mg Oral Daily Kathlee Nations Trigt III, MD      . DOPamine (INTROPIN) 800 mg in dextrose 5 % 250 mL infusion  2.5 mcg/kg/min Intravenous Continuous Kathlee Nations Trigt III, MD 4.3 mL/hr at 02/25/11 0900 3 mcg/kg/min at 02/25/11 0900  . famotidine (PEPCID) IVPB 20 mg  20 mg Intravenous Q12H Kathlee Nations Trigt III, MD   20 mg at 02/24/11 2204  . furosemide (LASIX) injection 80 mg  80 mg Intravenous Once Kathlee Nations Trigt III, MD   80 mg at 02/25/11 [phone]  . furosemide (LASIX) injection 80 mg  80 mg Intravenous BID Kathlee Nations Trigt III, MD      . insulin aspart (novoLOG) injection 0-24 Units  0-24 Units Subcutaneous Q2H Kerin Perna III, MD   2 Units at 02/24/11 2314  Followed by  . insulin aspart (novoLOG) injection 0-24 Units  0-24 Units Subcutaneous Q4H Kerin Perna III, MD   2 Units at 02/25/11 [phone]  . insulin regular bolus via infusion 0-10 Units  0-10 Units Intravenous TID WC Kerin Perna III, MD   1.2 Units at 02/24/11 1700  . metoprolol (LOPRESSOR) injection 2.5-5 mg  2.5-5 mg Intravenous Q2H PRN Kathlee Nations Trigt III, MD      . metoprolol tartrate (LOPRESSOR) tablet 12.5 mg  12.5 mg Oral BID Kathlee Nations Trigt III, MD       Or  . metoprolol tartrate (LOPRESSOR) 25 mg/10 mL oral suspension 12.5 mg  12.5 mg Per Tube BID Kathlee Nations Trigt III, MD      . morphine 2 MG/ML injection 1-4 mg  1-4 mg Intravenous Q1H PRN Kathlee Nations Trigt III, MD      . morphine 4 MG/ML injection 2-5 mg  2-5 mg Intravenous Q1H PRN Kathlee Nations Trigt III, MD   4 mg at 02/25/11 0617  . moxifloxacin (AVELOX) IVPB 400 mg  400 mg Intravenous Q24H Kathlee Nations Trigt III, MD   400 mg at 02/25/11 0749  . mupirocin  ointment (BACTROBAN) 2 % 1 application  1 application Nasal BID Kerin Perna III, MD   1 application at 02/24/11 [phone]  . nitroGLYCERIN 0.2 mg/mL in dextrose 5 % infusion  2-200 mcg/min Intravenous To OR Kathlee Nations Trigt III, MD   5 mcg/min at 02/24/11 0840  . nitroglycerin/verapamil/heparin/sodium bicarbonate solution irrigation for artery spasm   Irrigation To OR Kathlee Nations Trigt III, MD      . ondansetron [facility]) injection 4 mg  4 mg Intravenous Q6H PRN Kathlee Nations Trigt III, MD   4 mg at 02/24/11 2309  . pantoprazole (PROTONIX) EC tablet 40 mg  40 mg Oral Q1200 Kathlee Nations Trigt III, MD      . phenylephrine (NEO-SYNEPHRINE) 20,000 mcg in dextrose 5 % 250 mL infusion  0-100 mcg/min Intravenous Continuous Kathlee Nations Trigt III, MD 30 mL/hr at 02/25/11 0900 40 mcg/min at 02/25/11 0900  . sodium bicarbonate injection 50 mEq  50 mEq Intravenous Once Kathlee Nations Trigt III, MD   50 mEq at 02/24/11 1549  . sodium bicarbonate injection 50 mEq  50 mEq Intravenous Once Alleen Borne, MD   50 mEq at 02/24/11 2152  . sodium bicarbonate injection 50 mEq  50 mEq Intravenous Once Alleen Borne, MD   50 mEq at 02/25/11 0443  . sodium chloride 0.9 % injection 3 mL  3 mL Intravenous Q12H Kathlee Nations Trigt III, MD      . sodium chloride 0.9 % injection 3 mL  3 mL Intravenous PRN Kathlee Nations Trigt III, MD      . vancomycin (VANCOCIN) 1,000 mg in sodium chloride 0.9 % 100 mL IVPB  1,000 mg Intravenous Once Kathlee Nations Trigt III, MD   1,000 mg at 02/24/11 2047  . DISCONTD: 0.9 % irrigation (POUR BTL)    PRN Kathlee Nations Trigt III, MD   1,000 mL at 02/24/11 1105  . DISCONTD: aminocaproic acid (AMICAR) 10 g in sodium chloride 0.9 % 100 mL infusion   Intravenous To OR Kathlee Nations Trigt III, MD      . DISCONTD: aminocaproic acid (AMICAR) 5 g in sodium chloride 0.9 % 50 mL IVPB  5 g Intravenous To OR Kerin Perna III, MD      . DISCONTD: chlorhexidine (HIBICLENS) 4 % liquid 2  application  30 mL Topical UD Kathlee Nations Trigt III, MD      .  DISCONTD: dexmedetomidine (PRECEDEX) 200 mcg in sodium chloride 0.9 % 50 mL infusion  0.1-0.7 mcg/kg/hr Intravenous Continuous Kathlee Nations Trigt III, MD   0.3 mcg/kg/hr at 02/24/11 1715  . DISCONTD: dexmedetomidine (PRECEDEX) 400 mcg in sodium chloride 0.9 % 100 mL infusion  0.1-0.7 mcg/kg/hr Intravenous To OR Kathlee Nations Trigt III, MD      . DISCONTD: DOPamine (INTROPIN) 800 mg in dextrose 5 % 250 mL infusion  2-20 mcg/kg/min Intravenous To OR Kathlee Nations Trigt III, MD      . DISCONTD: EPINEPHrine (ADRENALIN) 4,000 mcg in dextrose 5 % 250 mL infusion  0.5-20 mcg/min Intravenous To OR Mikey Bussing, MD      . DISCONTD: hemostatic agents    PRN Kerin Perna III, MD   3 application at 02/24/11 1106  . DISCONTD: hemostatic agents    PRN Kerin Perna III, MD   1 application at 02/24/11 1107  . DISCONTD: hemostatic agents    PRN Kathlee Nations Suann Larry, MD   2 application at 02/24/11 1221  . DISCONTD: insulin regular (NOVOLIN R,HUMULIN R) 1 Units/mL in sodium chloride 0.9 % 100 mL infusion   Intravenous To OR Kathlee Nations Trigt III, MD      . DISCONTD: insulin regular (NOVOLIN R,HUMULIN R) 1 Units/mL in sodium chloride 0.9 % 100 mL infusion   Intravenous Continuous Kathlee Nations Trigt III, MD   0.4 Units/hr at 02/24/11 1800  . DISCONTD: lactated ringers infusion   Intravenous Continuous Kathlee Nations Trigt III, MD      . DISCONTD: magnesium sulfate (IV Push/IM) injection 40 mEq  40 mEq Other To OR Kathlee Nations Trigt III, MD      . DISCONTD: metoprolol tartrate (LOPRESSOR) tablet 12.5 mg  12.5 mg Oral Once Kathlee Nations Trigt III, MD      . DISCONTD: midazolam (VERSED) injection 2 mg  2 mg Intravenous Q1H PRN Kathlee Nations Trigt III, MD      . DISCONTD: nitroGLYCERIN 0.2 mg/mL in dextrose 5 % infusion  2-200 mcg/min Intravenous To OR Kathlee Nations Trigt III, MD      . DISCONTD: nitroGLYCERIN 0.2 mg/mL in dextrose 5 % infusion  0-100 mcg/min Intravenous Continuous Kathlee Nations Trigt III, MD      . DISCONTD: phenylephrine  (NEO-SYNEPHRINE) 20,000 mcg in dextrose 5 % 250 mL infusion  30-200 mcg/min Intravenous To OR Kathlee Nations Trigt III, MD      . DISCONTD: potassium chloride injection 80 mEq  80 mEq Other To OR Mikey Bussing, MD       Facility-Administered Medications Ordered in Other Encounters  Medication Dose Route Frequency Provider Last Rate Last Dose  . DISCONTD: 0.9 %  sodium chloride infusion    Continuous PRN Delbert Harness, CRNA      . DISCONTD: 0.9 %  sodium chloride infusion    Continuous PRN Delbert Harness, CRNA      . DISCONTD: albumin human 5 % solution    Continuous PRN Delbert Harness, CRNA      . DISCONTD: aminocaproic acid (AMICAR) 10 g in sodium chloride 0.9 % 140 mL infusion  10 g  Continuous PRN Delbert Harness, CRNA      . DISCONTD: calcium chloride injection    PRN Delbert Harness, CRNA   0.2 g at 02/24/11 1430  . DISCONTD: fentaNYL (SUBLIMAZE) injection  PRN Delbert Harness, CRNA   100 mcg at 02/24/11 1215  . DISCONTD: heparin injection    PRN Delbert Harness, CRNA   16,000 Units at 02/24/11 1015  . DISCONTD: lactated ringers infusion    Continuous PRN Delbert Harness, CRNA      . DISCONTD: lidocaine (cardiac) 100 mg/58ml (XYLOCAINE) 20 MG/ML injection 2%    PRN Delbert Harness, CRNA   100 mg at 02/24/11 0825  . DISCONTD: midazolam (VERSED) 5 MG/5ML injection    PRN Delbert Harness, CRNA   5 mg at 02/24/11 1215  . DISCONTD: phenylephrine (NEO-SYNEPHRINE) 0.08 mg/mL in dextrose 5 % 250 mL infusion  20 mg  Continuous PRN Delbert Harness, CRNA 37.5 mL/hr at 02/24/11 1515 50 mcg/min at 02/24/11 1515  . DISCONTD: propofol (DIPRIVAN) 10 MG/ML infusion    PRN Delbert Harness, CRNA   60 mg at 02/24/11 0802  . DISCONTD: protamine injection    PRN Delbert Harness, CRNA   50 mg at 02/24/11 1320  . DISCONTD: rocuronium (ZEMURON) injection    PRN Delbert Harness, CRNA   50 mg at 02/24/11 0802  . DISCONTD: sodium bicarbonate 4.2 % injection    PRN Delbert Harness, CRNA   25 mL at 02/24/11 1341  . DISCONTD: vecuronium  (NORCURON) injection    PRN Delbert Harness, CRNA   10 mg at 02/24/11 1245    PE: General appearance: alert. Received Morphine for pain Lungs: decreased BS Heart: regular rate and rhythm, S1, S2 normal 1/6 SEM ABD soft Extremities: 1+ LEE Pulses: radials 2+ and symmetric,  1+ DPs.  Lab Results:   Basename 02/25/11 0230 02/24/11 2149 02/24/11 2139 02/24/11 1525  WBC 7.8 -- 8.2 8.8  HGB 7.1* 10.2* 8.3* --  HCT 22.0* 30.0* 25.3* --  PLT 81* -- 103* 105*   BMET  Basename 02/25/11 0230 02/24/11 2149 02/24/11 2139 02/24/11 1520  NA 142 142 -- 142  K 4.3 4.6 -- 4.1  CL 109 113* -- --  CO2 22 -- -- --  GLUCOSE 118* 131* -- 89  BUN 47* 43* -- --  CREATININE 2.60* 2.30* 2.45* --  CALCIUM 8.7 -- -- --   PT/INR  Basename 02/25/11 0248 02/24/11 1525  LABPROT 18.6* 18.3*  INR 1.52* 1.49    Studies/Results: 02/25/11, PORTABLE CHEST - 1 VIEW  Comparison: 02/24/2011  Findings: Cardiomegaly with prominent mediastinal contours. Right  chest wall battery pack with dual leads, unchanged position. Right  IJ Swan-Ganz catheter tip projecting over the right main pulmonary  artery. Left subclavian central venous catheter tip projecting  over the brachiocephalic vein/SVC confluence.  Retrocardiac opacity may represent a combination of consolidation  and effusion. Left-sided chest tube in place. No pneumothorax  identified. No acute osseous abnormality.  IMPRESSION:  Prominent cardiomediastinal contours status post median sternotomy  and CABG.  Retrocardiac opacity may reflect consolidation and effusion. Left-  sided chest tube in place. No pneumothorax.  Assessment/Plan   Active Problems:  S/P CABG x 2: (LIMA-LAD  SVG - RCA)  Aortic valve replaced:  ABLA  Plan:  POD #1, CABG x 2: (LIMA-LAD  SVG - RCA), AVR.  On Bipap, Neo. Dopamine,  S/P two units PRBCs and one plasma.  Externally pacing.   LOS: 1 day    HAGER,BRYAN W 02/25/2011 9:15 AM     Patient seen and examined. Agree  with assessment and plan.  Day 1 s/p percardial AVR and 2 v CABG.  Now on BiPAP, 40 micrograms of neosynephrine, IV Lasix given for vol overload and pressure support.Getting PRBC and PLT transfusion for anemia and thrombocytopenia.  Lennette Bihari, MD, [facility] 02/25/2011 9:50 AM

## 2011-02-25 NOTE — Op Note (Signed)
Nathaniel Steele, SEPTER NO.:  [id number]  MEDICAL RECORD NO.:  [id number]  LOCATION:  2303                         FACILITY:  [facility]  PHYSICIAN:  Kerin Perna, M.D.  DATE OF BIRTH:  01-May-1926  DATE OF PROCEDURE:  02/24/2011 DATE OF DISCHARGE:                              OPERATIVE REPORT   OPERATION: 1. Coronary artery bypass grafting x2 (left internal mammary artery to     left anterior descending, saphenous vein graft to distal right     coronary artery). 2. Aortic valve replacement for aortic stenosis with a 23-mm     pericardial tissue valve [facility] Ease serial [id number]). 3. Placement of left subclavian triple-lumen IV catheter for IV     access.  PREOPERATIVE DIAGNOSES:  Severe aortic stenosis with tricuspid aortic valve and severe two-vessel coronary artery disease with class 3 angina and congestive heart failure.  POSTOPERATIVE DIAGNOSES:  Severe aortic stenosis with tricuspid aortic valve and severe two-vessel coronary artery disease with class 3 angina and congestive heart failure.  SURGEON:  Kerin Perna, MD  ASSISTANT:  Al Corpus, surgical assistant.  ANESTHESIA:  General by Dr. Arta Bruce.  INDICATIONS:  The patient is an 78 year old gentleman with long-standing progressive aortic stenosis followed carefully by Dr. Alanda Amass with a recent echo last month showing now severe aortic stenosis with a valvular gradient over 40 mmHg.  He has noted a significant decline in his exercise tolerance.  A cardiac catheterization by Dr. Nicholaus Bloom demonstrated a 90-95% stenosis of the right coronary artery and a 70% tubular stenosis of the LAD.  Although the patient has some chronic renal failure and advanced age, he was felt to be candidate for surgical coronary revascularization combined with aortic valve replacement.  Prior to surgery, I examined the patient in the office on two occasions and reviewed results of the cardiac cath and echo  with the patient and wife.  I discussed the plan on using a tissue aortic valve due to his advanced age and to avoid long-term Coumadin dependence.  I discussed with the patient and his wife the major aspects of the surgery including use of general anesthesia, cardiopulmonary bypass, the location of the surgical incisions, and the expected postoperative hospital recovery.  I also discussed with the patient the potential risks of stroke, bleeding, blood transfusion requirement, infection, ventilator dependence, renal failure requiring dialysis, and death.  He demonstrated his understanding and agreed to proceed with surgery under what I felt was an informed consent.  FINDINGS: 1. Tricuspid severely calcified aortic valve. 2. LVH of the ventricle. 3. Dense adhesions in the left pleural space. 4. Adequate targets for grafting and good conduit.  PROCEDURE:  The patient was brought to the operative room and placed supine on the operative table where general anesthesia was induced.  The chest, abdomen, and legs were prepped with Betadine and draped as a sterile field.  A sternal incision was made as the saphenous vein was harvested endoscopically from the right leg.  The left internal mammary artery was harvested as a pedicle graft.  It was a 1.5-mm vessel with good flow.  The sternal retractor was placed and the pericardium was opened  and suspended.  Pursestrings were placed in the ascending aorta and right atrium and the patient was cannulated after heparin was administered and the ACT was documented as being therapeutic.  The coronaries were identified for grafting and antegrade and retrograde cold blood cardioplegic catheters were inserted.  The patient was cooled to 32 degrees and aortic cross-clamp was applied.  A 800 mL of cold blood cardioplegia was delivered in split doses between the antegrade aortic and retrograde coronary sinus catheters.  There was good cardioplegic arrest and  septal temperature dropped to less than 10 degrees.  The distal coronary anastomoses were then performed.  The first distal anastomosis was to the posterolateral branch of right coronary artery. This was a 1.5-mm vessel.  A reverse saphenous vein was sewn end-to-side with running 7-0 Prolene with good flow through the graft.  The second distal anastomosis was to the mid to distal third of the LAD.  This had a proximal 70% calcified stenosis.  The left IMA pedicle was brought through an opening created in the left lateral pericardium, was brought down onto the LAD, and sewn end-to-side with running 8-0 Prolene.  There was good flow through the anastomosis after briefly releasing the pedicle bulldog on the mammary artery.  The bulldog was reapplied and the pedicle was secured to the epicardium.  Cardioplegia was redosed.  A transverse aortotomy was performed.  Aortic valve was inspected.  It had 3 leaflets.  It was heavily calcified with poor mobility of the leaflets.  The leaflets were excised.  The anulus was debrided of calcium.  The outflow tract was irrigated with copious amounts of cold saline.  The anulus was sized to a 23-mm Magna Ease valve.  Subannular 2-0 pledgeted Ethibond sutures were placed around the anulus, #18 total.  The valve was prepared according to protocol.  The sutures were placed through the sewing ring of the valve and the valve was seated and the sutures were tied.  The valve conformed the anulus very nicely without evidence of spacing which would result in a leak. The coronaries were widely patent.  The aortotomy was closed in 2 layers using running 4-0 Prolene.  Air was vented from the corners of left side of the heart with a dose of retrograde warm blood cardioplegia and the usual de-airing maneuvers on bypass.  The cross-clamp was then removed.  The heart resumed a spontaneous rhythm.  The patient was rewarmed.  The grafts were checked, found to be  hemostatic with good flow.  The aortotomy was hemostatic.  The patient was rewarmed to 37 degrees.  The lungs were expanded.  The patient was weaned off pump on low-dose or renal dose dopamine.  Hemodynamics were stable.  Cardiac output was normal.  Protamine was administered without adverse reaction.  The cannula was removed.  The mediastinum had some mild diffuse coagulopathy.  Platelet count was low and the patient was transfused with platelets.  The superior pericardial fat was closed over the aorta and vein graft.  The left pleural space was drained with a chest tube. The mediastinum was drained anteriorly with a Blake drain.  The sternum was closed with interrupted steel wire.  The pectoralis fascia was closed running #1 Vicryl.  The subcutaneous and skin layers were closed with running Vicryl.  Next, a left subclavian triple-lumen catheter was placed via the left subclavicular or infraclavicular approach over guidewire in Seldinger technique.  This had good return of venous blood and was flushed and secured to the skin  with silk sutures.  A followup chest x-ray showed the triple-lumen catheter to be in good position.  The patient returned to the ICU in stable condition.  Total cardiopulmonary bypass time was 147 minutes.     Kerin Perna, M.D.     PV/MEDQ  D:  02/24/2011  T:  02/25/2011  Job:  [id number]

## 2011-02-26 ENCOUNTER — Inpatient Hospital Stay (HOSPITAL_COMMUNITY): Payer: Medicare Other

## 2011-02-26 DIAGNOSIS — Z950 Presence of cardiac pacemaker: Secondary | ICD-10-CM | POA: Diagnosis present

## 2011-02-26 DIAGNOSIS — I4821 Permanent atrial fibrillation: Secondary | ICD-10-CM | POA: Diagnosis present

## 2011-02-26 DIAGNOSIS — D62 Acute posthemorrhagic anemia: Secondary | ICD-10-CM | POA: Diagnosis not present

## 2011-02-26 DIAGNOSIS — J9690 Respiratory failure, unspecified, unspecified whether with hypoxia or hypercapnia: Secondary | ICD-10-CM | POA: Diagnosis not present

## 2011-02-26 LAB — CBC
HCT: 23.5 % — ABNORMAL LOW (ref 39.0–52.0)
HCT: 26.8 % — ABNORMAL LOW (ref 39.0–52.0)
Hemoglobin: 7.9 g/dL — ABNORMAL LOW (ref 13.0–17.0)
Hemoglobin: 9 g/dL — ABNORMAL LOW (ref 13.0–17.0)
MCH: 28.6 pg (ref 26.0–34.0)
MCH: 28.8 pg (ref 26.0–34.0)
MCHC: 33.6 g/dL (ref 30.0–36.0)
MCHC: 33.6 g/dL (ref 30.0–36.0)
MCV: 85.1 fL (ref 78.0–100.0)
MCV: 85.9 fL (ref 78.0–100.0)
Platelets: 80 10*3/uL — ABNORMAL LOW (ref 150–400)
Platelets: 69 10*3/uL — ABNORMAL LOW (ref 150–400)
RBC: 2.76 MIL/uL — ABNORMAL LOW (ref 4.22–5.81)
RBC: 3.12 MIL/uL — ABNORMAL LOW (ref 4.22–5.81)
RDW: 16.3 % — ABNORMAL HIGH (ref 11.5–15.5)
RDW: 16.1 % — ABNORMAL HIGH (ref 11.5–15.5)
WBC: 6.9 10*3/uL (ref 4.0–10.5)
WBC: 8.1 10*3/uL (ref 4.0–10.5)

## 2011-02-26 LAB — PREPARE PLATELET PHERESIS: Unit division: 0

## 2011-02-26 LAB — COMPREHENSIVE METABOLIC PANEL
ALT: 58 U/L — ABNORMAL HIGH (ref 0–53)
AST: 70 U/L — ABNORMAL HIGH (ref 0–37)
Albumin: 3.2 g/dL — ABNORMAL LOW (ref 3.5–5.2)
Alkaline Phosphatase: 45 U/L (ref 39–117)
BUN: 59 mg/dL — ABNORMAL HIGH (ref 6–23)
CO2: 24 mEq/L (ref 19–32)
Calcium: 9 mg/dL (ref 8.4–10.5)
Chloride: 108 mEq/L (ref 96–112)
Creatinine, Ser: 3.4 mg/dL — ABNORMAL HIGH (ref 0.50–1.35)
GFR calc Af Amer: 18 mL/min — ABNORMAL LOW (ref >90)
GFR calc non Af Amer: 15 mL/min — ABNORMAL LOW (ref >90)
Glucose, Bld: 121 mg/dL — ABNORMAL HIGH (ref 70–99)
Potassium: 4.5 mEq/L (ref 3.5–5.1)
Sodium: 140 mEq/L (ref 135–145)
Total Bilirubin: 0.9 mg/dL (ref 0.3–1.2)
Total Protein: 5 g/dL — ABNORMAL LOW (ref 6.0–8.3)

## 2011-02-26 LAB — GLUCOSE, CAPILLARY
Glucose-Capillary: 101 mg/dL — ABNORMAL HIGH (ref 70–99)
Glucose-Capillary: 109 mg/dL — ABNORMAL HIGH (ref 70–99)
Glucose-Capillary: 112 mg/dL — ABNORMAL HIGH (ref 70–99)
Glucose-Capillary: 122 mg/dL — ABNORMAL HIGH (ref 70–99)
Glucose-Capillary: 123 mg/dL — ABNORMAL HIGH (ref 70–99)

## 2011-02-26 LAB — POCT I-STAT 3, ART BLOOD GAS (G3+)
Acid-base deficit: 2 mmol/L (ref 0.0–2.0)
Bicarbonate: 22.7 mEq/L (ref 20.0–24.0)
O2 Saturation: 91 %
Patient temperature: 37.1
TCO2: 24 mmol/L (ref 0–100)
pCO2 arterial: 40.1 mmHg (ref 35.0–45.0)
pH, Arterial: 7.362 (ref 7.350–7.450)
pO2, Arterial: 64 mmHg — ABNORMAL LOW (ref 80.0–100.0)

## 2011-02-26 LAB — BASIC METABOLIC PANEL
BUN: 63 mg/dL — ABNORMAL HIGH (ref 6–23)
CO2: 23 mEq/L (ref 19–32)
Calcium: 9.3 mg/dL (ref 8.4–10.5)
Chloride: 110 mEq/L (ref 96–112)
Creatinine, Ser: 3.55 mg/dL — ABNORMAL HIGH (ref 0.50–1.35)
GFR calc Af Amer: 17 mL/min — ABNORMAL LOW (ref >90)
GFR calc non Af Amer: 15 mL/min — ABNORMAL LOW (ref >90)
Glucose, Bld: 120 mg/dL — ABNORMAL HIGH (ref 70–99)
Potassium: 4.5 mEq/L (ref 3.5–5.1)
Sodium: 144 mEq/L (ref 135–145)

## 2011-02-26 LAB — PROTIME-INR
INR: 1.25 (ref 0.00–1.49)
Prothrombin Time: 16 seconds — ABNORMAL HIGH (ref 11.6–15.2)

## 2011-02-26 LAB — PREPARE RBC (CROSSMATCH)

## 2011-02-26 MED ORDER — INSULIN ASPART 100 UNIT/ML ~~LOC~~ SOLN
0.0000 [IU] | SUBCUTANEOUS | Status: DC
  Administered 2011-02-26 – 2011-02-28 (×3): 2 [IU] via SUBCUTANEOUS

## 2011-02-26 MED ORDER — FENTANYL CITRATE 0.05 MG/ML IJ SOLN
25.0000 ug | INTRAMUSCULAR | Status: DC | PRN
  Administered 2011-02-26 – 2011-02-28 (×9): 25 ug via INTRAVENOUS
  Filled 2011-02-26 (×5): qty 2

## 2011-02-26 MED ORDER — CALCITRIOL 0.25 MCG PO CAPS
0.2500 ug | ORAL_CAPSULE | Freq: Every day | ORAL | Status: AC
  Administered 2011-02-26 – 2011-03-11 (×14): 0.25 ug via ORAL
  Filled 2011-02-26 (×14): qty 1

## 2011-02-26 MED FILL — Electrolyte-R (PH 7.4) Solution: INTRAVENOUS | Qty: 4000 | Status: AC

## 2011-02-26 MED FILL — Lidocaine HCl IV Inj 20 MG/ML: INTRAVENOUS | Qty: 5 | Status: AC

## 2011-02-26 MED FILL — Sodium Chloride IV Soln 0.9%: INTRAVENOUS | Qty: 1000 | Status: AC

## 2011-02-26 MED FILL — Heparin Sodium (Porcine) Inj 1000 Unit/ML: INTRAMUSCULAR | Qty: 10 | Status: AC

## 2011-02-26 MED FILL — Mannitol IV Soln 20%: INTRAVENOUS | Qty: 500 | Status: AC

## 2011-02-26 MED FILL — Heparin Sodium (Porcine) Inj 1000 Unit/ML: INTRAMUSCULAR | Qty: 30 | Status: AC

## 2011-02-26 MED FILL — Sodium Chloride Irrigation Soln 0.9%: Qty: 3000 | Status: AC

## 2011-02-26 MED FILL — Sodium Bicarbonate IV Soln 8.4%: INTRAVENOUS | Qty: 50 | Status: AC

## 2011-02-26 NOTE — Progress Notes (Signed)
Patient examined and record reviewed.Hemodynamics stable,labs satisfactory.Patient had stable day.Continue current care. VAN TRIGT III,PETER 02/26/2011    

## 2011-02-26 NOTE — Progress Notes (Signed)
2 Days Post-Op Procedure(s) (LRB): AORTIC VALVE REPLACEMENT (AVR) (N/A) CORONARY ARTERY BYPASS GRAFTING (CABG) (N/A)                    [address]            [name] [address]          [phone]      Subjective: BIPAP .80  p02 64 CXR low lung volumes,poss mediastinal hematoma from coagulopathy Creat up to 3.3 will call renal Hemodynamics stable on renal dopamine AV paced 90/min ECHO shows good lv, no per. effusion  Objective: Vital signs in last 24 hours: Temp:  [98.6 F (37 C)-99.1 F (37.3 C)] 98.8 F (37.1 C) (02/06 0801) Pulse Rate:  [86-90] 88  (02/06 0728) Cardiac Rhythm:  [-] Ventricular paced (02/05 2000) Resp:  [11-27] 21  (02/06 0728) BP: (69-135)/(33-98) 135/78 mmHg (02/06 0728) SpO2:  [89 %-98 %] 96 % (02/06 0728) Arterial Line BP: (89-144)/(44-68) 133/59 mmHg (02/06 0700) FiO2 (%):  [60 %] 60 % (02/05 1425) Weight:  [184 lb 1.4 oz (83.5 kg)] 184 lb 1.4 oz (83.5 kg) (02/06 0600)  Hemodynamic parameters for last 24 hours: PAP: (26-46)/(14-34) 31/19 mmHg CO:  [5.5 L/min-5.6 L/min] 5.6 L/min CI:  [2.9 L/min/m2-3 L/min/m2] 3 L/min/m2  Intake/Output from previous day: 02/05 0701 - 02/06 0700 In: 2688.6 [I.V.:1668.6; Blood:550; IV Piggyback:470] Out: 1730 [Urine:1285; Chest Tube:445] Intake/Output this shift:    EXAM -- neuro w/ general weakness               Lungs clear,no murmur Lab Results:  [facility] 02/26/11 0315 02/25/11 1655  WBC 6.9 7.9  HGB 7.9* 7.3*  HCT 23.5* 21.8*  PLT 80* 98*  --  diuresis BMET:  Basename 02/26/11 0315 02/25/11 1655 02/25/11 1643 02/25/11 0230  NA 140 -- 144 --  K 4.5 -- 4.7 --  CL 108 -- 111 --  CO2 24 -- -- 22  GLUCOSE 121* -- 140* --  BUN 59* -- 52* --  CREATININE 3.40* 3.07* -- --  CALCIUM 9.0 -- -- 8.7    PT/INR:  Basename 02/26/11 0315  LABPROT 16.0*  INR 1.25   ABG    Component Value Date/Time   PHART 7.362 02/26/2011 0317   HCO3 22.7 02/26/2011 0317   TCO2 24 02/26/2011 0317   ACIDBASEDEF 2.0 02/26/2011 0317   O2SAT 91.0 02/26/2011 0317   CBG (last 3)   Basename 02/26/11 0339 02/25/11 2346 02/25/11 1922  GLUCAP 101* 122* 122*    Assessment/Plan: S/P Procedure(s) (LRB): AORTIC VALVE REPLACEMENT (AVR) (N/A) CORONARY ARTERY BYPASS GRAFTING (CABG) (N/A) PLAN  1 unit PRBC,cont BIPAP - diuresis,  OOB to chair   LOS: 2 days    Nathaniel Steele,Nathaniel Steele 02/26/2011

## 2011-02-26 NOTE — Progress Notes (Signed)
Subjective:  Awake and alert, on bipap  Objective:  Vital Signs in the last 24 hours: Temp:  [98.5 F (36.9 C)-99.1 F (37.3 C)] 98.5 F (36.9 C) (02/06 1000) Pulse Rate:  [86-90] 87  (02/06 1000) Resp:  [11-27] 24  (02/06 1000) BP: (69-135)/(33-98) 130/70 mmHg (02/06 1000) SpO2:  [89 %-98 %] 92 % (02/06 0915) Arterial Line BP: (89-144)/(44-68) 131/57 mmHg (02/06 0915) FiO2 (%):  [60 %] 60 % (02/05 1425) Weight:  [83.5 kg (184 lb 1.4 oz)] 83.5 kg (184 lb 1.4 oz) (02/06 0600)  Intake/Output from previous day:  Intake/Output Summary (Last 24 hours) at 02/26/11 1021 Last data filed at 02/26/11 0935  Gross per 24 hour  Intake 2473.65 ml  Output   1560 ml  Net 913.65 ml    Physical Exam: General appearance: alert, cooperative and no distress Lungs: decreased breath sounds bilat Heart: regular rate and rhythm   Rate: 88  Rhythm: V-paced  Lab Results:  Basename 02/26/11 0315 02/25/11 1655  WBC 6.9 7.9  HGB 7.9* 7.3*  PLT 80* 98*    Basename 02/26/11 0315 02/25/11 1655 02/25/11 1643 02/25/11 0230  NA 140 -- 144 --  K 4.5 -- 4.7 --  CL 108 -- 111 --  CO2 24 -- -- 22  GLUCOSE 121* -- 140* --  BUN 59* -- 52* --  CREATININE 3.40* 3.07* -- --   No results found for this basename: TROPONINI:2,CK,MB:2 in the last 72 hours Hepatic Function Panel  Basename 02/26/11 0315  PROT 5.0*  ALBUMIN 3.2*  AST 70*  ALT 58*  ALKPHOS 45  BILITOT 0.9  BILIDIR --  IBILI --   No results found for this basename: CHOL in the last 72 hours  Basename 02/26/11 0315  INR 1.25    Imaging: Imaging results have been reviewed  Cardiac Studies:  Assessment/Plan:   Active Problems:  S/P CABG x 2: (LIMA-LAD  SVG - RCA)  Severe AS, tissue AVR this admission -- EF normal by echo   Chronic renal failure, pt has AVF LUE  Anemia, post op, Hgb 7.9  Respiratory failure, hypoxia post op, on bipap currently  SLE (systemic lupus erythematosus)  PAF (paroxysmal atrial fibrillation), on  Amiodarone prior to admission  Pacemaker, MDT MDT implanted 08/06  Chronic anticoagulation, on Coumadin prior to admission   Plan- Getting blood transfusion now. Lasix per CVTS., we will follow along.  [employer] PA-C 02/26/2011, 10:21 AM  Post-op hypoxia - continues on BiPaP BP stable, but somewhat elevated - would gradually restart home amlodipine in addition to BB No pericardial effusion noted on echo - normal EF but likely elevated LVEDP - agree with diuresis  Continued care per CVTS  Marykay Lex, M.D., M.S. [facility] [address] [place], [place]  [address]  [phone]  02/26/2011 10:35 AM

## 2011-02-26 NOTE — Progress Notes (Signed)
PT Cancellation Note  Treatment cancelled today due to medical issues with patient which prohibited therapy. Pt on 100% bi-pap per RT. Will hold PT today. Will continue to follow.   [facility] HELEN 02/26/2011, 10:36 AM [phone]

## 2011-02-26 NOTE — Consults (Addendum)
Reason for Consult:                 RENAL CONSULT Abnormal renal function in a patient with chronic kidney disease Referring Physician: Dr. Maren Beach And Nathaniel Steele is a 78 y.o. white man with chronic kidney disease. He has prior history Lupus nephritis and had CR 2.47 according to [facility] (February 2009).  Outpatient notes indicate BUN/ CR 50/2.99 on 30 Jan 2011 in Dr. Kandis Cocking office.   He has had progressive narrowing of his aortic valve and underwent cardiac catheterization on 24 January. He underwent aortic valve replacement and two-vessel CABG on 02/24/2011. 02/24/2011  CR 2.6 02/042013   CR 3.07 02/26/2011  CR 3.40 Renal consult was requested by Dr. Donata Clay   Past Medical History  Diagnosis Date  . Shortness of breath with exertion  . Chronic kidney disease     not on dialysis yet  . Coronary artery disease   . Hypertension   . Arthritis   . Blood dyscrasia     one time had low platlet count  . Dysrhythmia     atrial fib  Pacemaker R subclavian 2006 History of lupus nephritis Tremor Secondary hyperparathyroidism  --PTH 96 ZJune 2012 on calcitriol 0.25 mcg/d  Family history-- parents died ? reason. He has a twin brother who is healthy. he has 2 sisters who are healthy. he has a son who is healthy Social History: Born and grew up in [place] [facility] [place], he finished sixth grade. He worked raising tobacco, Lives with wife, married 61 yr, he does not smoke cigarettes or drink alcohol  Allergies:  Allergies  Allergen Reactions  . Aspirin Hives    Patient states that he can take the enteric coated but not the uncoated  . Penicillins Hives     Scheduled:   . acetaminophen  1,000 mg Intravenous Q6H  . bisacodyl  10 mg Oral Daily   Or  . bisacodyl  10 mg Rectal Daily  . Chlorhexidine Gluconate Cloth  6 each Topical Daily  . docusate sodium  200 mg Oral Daily  . furosemide  80 mg Intravenous BID  . insulin aspart  0-24 Units Subcutaneous Q4H  . metoprolol tartrate  12.5  mg Oral BID   Or  . metoprolol tartrate  12.5 mg Per Tube BID  . moxifloxacin  400 mg Intravenous Q24H  . mupirocin ointment  1 application Nasal BID  . pantoprazole  40 mg Oral Q1200  . sodium chloride  3 mL Intravenous Q12H  . DISCONTD: insulin aspart  0-24 Units Subcutaneous Q4H  . DISCONTD: insulin regular  0-10 Units Intravenous TID WC   Continuous:   . sodium chloride    . DOPamine 3 mcg/kg/min (02/26/11 1500)  . DISCONTD: sodium chloride 20 mL/hr at 02/26/11 0600  . DISCONTD: sodium chloride 20 mL/hr at 02/26/11 0600  . DISCONTD: phenylephrine (NEO-SYNEPHRINE) Adult infusion 30 mcg/min (02/25/11 1900)  . DISCONTD: phenylephrine (NEO-SYNEPHRINE) Adult infusion Stopped (02/26/11 0400)      ROS-+ DOE, no angina, no claudication, no melena, no hematochezia, no gross hematuria, no renal colic, no decreased force of urinary stream, no nocturia, no orthopnea, no purulent sputum, no hemoptysis, no weight gain or weight loss, no cold or heat intolerance.  Physical exam Blood pressure 144/63, pulse 88, temperature 99.6 F (37.6 C), temperature source Oral, resp. rate 21, height 5\' 7"  (1.702 m), weight 83.5 kg (184 lb 1.4 oz), SpO2 100.00%. Chest-rhonchi, sternotomy healed, pacemaker right subclavian Heart-no rub is heard  Abdomen-nontender GU-Foley catheter in place Extremities-AV fistula patent left forearm, trace edema Neuro-right-handed, sensation intact    Assessment/Plan: 1. Chronic kidney disease-stage IV with worsening following AVR/CABG. Chest x-ray shows evidence of volume overload currently on furosemide 80 twice a day. May need more. No IVs no needlesticks left forearm (site of AV fistula) 2. S./P. CABG/AVR-per primary service 3. Anemia-check iron studies 4. Secondary hyperparathyroidism- Continue calcitriol 25 mcg/D. 5. Prior history atrial fibrillation-currently paced rhythm. Had been on Coumadin and amiodarone prior to admission Pacemaker present right  subclavian [name] F 02/26/2011, 3:37 PM

## 2011-02-27 ENCOUNTER — Inpatient Hospital Stay (HOSPITAL_COMMUNITY): Payer: Medicare Other

## 2011-02-27 DIAGNOSIS — E1165 Type 2 diabetes mellitus with hyperglycemia: Secondary | ICD-10-CM

## 2011-02-27 DIAGNOSIS — IMO0001 Reserved for inherently not codable concepts without codable children: Secondary | ICD-10-CM

## 2011-02-27 LAB — BASIC METABOLIC PANEL
BUN: 65 mg/dL — ABNORMAL HIGH (ref 6–23)
CO2: 25 mEq/L (ref 19–32)
Calcium: 9.3 mg/dL (ref 8.4–10.5)
Chloride: 111 mEq/L (ref 96–112)
Creatinine, Ser: 3.35 mg/dL — ABNORMAL HIGH (ref 0.50–1.35)
GFR calc Af Amer: 18 mL/min — ABNORMAL LOW (ref >90)
GFR calc non Af Amer: 16 mL/min — ABNORMAL LOW (ref >90)
Glucose, Bld: 114 mg/dL — ABNORMAL HIGH (ref 70–99)
Potassium: 4.1 mEq/L (ref 3.5–5.1)
Sodium: 145 mEq/L (ref 135–145)

## 2011-02-27 LAB — IRON AND TIBC: Saturation Ratios: 9 % — ABNORMAL LOW (ref 20–55)

## 2011-02-27 LAB — TYPE AND SCREEN
ABO/RH(D): A POS
Antibody Screen: NEGATIVE
Unit division: 0
Unit division: 0
Unit division: 0
Unit division: 0

## 2011-02-27 LAB — GLUCOSE, CAPILLARY
Glucose-Capillary: 104 mg/dL — ABNORMAL HIGH (ref 70–99)
Glucose-Capillary: 106 mg/dL — ABNORMAL HIGH (ref 70–99)
Glucose-Capillary: 108 mg/dL — ABNORMAL HIGH (ref 70–99)
Glucose-Capillary: 113 mg/dL — ABNORMAL HIGH (ref 70–99)
Glucose-Capillary: 114 mg/dL — ABNORMAL HIGH (ref 70–99)
Glucose-Capillary: 140 mg/dL — ABNORMAL HIGH (ref 70–99)
Glucose-Capillary: 95 mg/dL (ref 70–99)

## 2011-02-27 LAB — PREPARE PLATELET PHERESIS: Unit division: 0

## 2011-02-27 LAB — POCT I-STAT 3, ART BLOOD GAS (G3+)
Acid-base deficit: 1 mmol/L (ref 0.0–2.0)
Bicarbonate: 23.3 mEq/L (ref 20.0–24.0)
O2 Saturation: 94 %
Patient temperature: 99.6
TCO2: 24 mmol/L (ref 0–100)
pCO2 arterial: 37.4 mmHg (ref 35.0–45.0)
pH, Arterial: 7.405 (ref 7.350–7.450)
pO2, Arterial: 71 mmHg — ABNORMAL LOW (ref 80.0–100.0)

## 2011-02-27 LAB — FERRITIN: Ferritin: 97 ng/mL (ref 22–322)

## 2011-02-27 LAB — CBC
HCT: 26.2 % — ABNORMAL LOW (ref 39.0–52.0)
Hemoglobin: 8.7 g/dL — ABNORMAL LOW (ref 13.0–17.0)
MCH: 29 pg (ref 26.0–34.0)
MCHC: 33.2 g/dL (ref 30.0–36.0)
MCV: 87.3 fL (ref 78.0–100.0)
Platelets: 88 10*3/uL — ABNORMAL LOW (ref 150–400)
RBC: 3 MIL/uL — ABNORMAL LOW (ref 4.22–5.81)
RDW: 16.2 % — ABNORMAL HIGH (ref 11.5–15.5)
WBC: 7.9 10*3/uL (ref 4.0–10.5)

## 2011-02-27 MED ORDER — AMIODARONE HCL IN DEXTROSE 360-4.14 MG/200ML-% IV SOLN
INTRAVENOUS | Status: AC
  Filled 2011-02-27: qty 200

## 2011-02-27 MED ORDER — AMIODARONE HCL IN DEXTROSE 360-4.14 MG/200ML-% IV SOLN
30.0000 mg/h | INTRAVENOUS | Status: DC
  Administered 2011-02-27: 60 mg/h via INTRAVENOUS
  Administered 2011-02-28: 30 mg/h via INTRAVENOUS
  Filled 2011-02-27 (×6): qty 200

## 2011-02-27 MED ORDER — AMIODARONE HCL 200 MG PO TABS
200.0000 mg | ORAL_TABLET | Freq: Every day | ORAL | Status: DC
  Administered 2011-02-27 – 2011-03-12 (×13): 200 mg via ORAL
  Filled 2011-02-27 (×14): qty 1

## 2011-02-27 MED ORDER — FERUMOXYTOL INJECTION 510 MG/17 ML
510.0000 mg | Freq: Once | INTRAVENOUS | Status: AC
  Administered 2011-02-27: 510 mg via INTRAVENOUS
  Filled 2011-02-27: qty 17

## 2011-02-27 MED ORDER — DARBEPOETIN ALFA-POLYSORBATE 150 MCG/0.3ML IJ SOLN
150.0000 ug | INTRAMUSCULAR | Status: DC
  Administered 2011-02-28 – 2011-03-07 (×2): 150 ug via SUBCUTANEOUS
  Filled 2011-02-27 (×4): qty 0.3

## 2011-02-27 MED ORDER — FUROSEMIDE 10 MG/ML IJ SOLN
160.0000 mg | Freq: Two times a day (BID) | INTRAVENOUS | Status: AC
  Administered 2011-02-27: 160 mg via INTRAVENOUS
  Filled 2011-02-27: qty 16

## 2011-02-27 MED FILL — Heparin Sodium (Porcine) Inj 1000 Unit/ML: INTRAMUSCULAR | Qty: 10 | Status: AC

## 2011-02-27 MED FILL — Lactated Ringer's Solution: INTRAVENOUS | Qty: 500 | Status: AC

## 2011-02-27 MED FILL — Nitroglycerin IV Soln 5 MG/ML: INTRAVENOUS | Qty: 10 | Status: AC

## 2011-02-27 MED FILL — Magnesium Sulfate Inj 50%: INTRAMUSCULAR | Qty: 10 | Status: AC

## 2011-02-27 MED FILL — Verapamil HCl IV Soln 2.5 MG/ML: INTRAVENOUS | Qty: 4 | Status: AC

## 2011-02-27 MED FILL — Potassium Chloride Inj 2 mEq/ML: INTRAVENOUS | Qty: 40 | Status: AC

## 2011-02-27 NOTE — Evaluation (Signed)
Clinical/Bedside Swallow Evaluation Patient Details  Name: Nathaniel Steele MRN: [id number] DOB: Mar 31, 1926 Today's Date: 02/27/2011  Past Medical History:  Past Medical History  Diagnosis Date  . Shortness of breath with exertion  . Chronic kidney disease     not on dialysis yet  . Coronary artery disease   . Hypertension   . Arthritis   . Blood dyscrasia     one time had low platlet count  . Dysrhythmia     atrial fib   Past Surgical History:  Past Surgical History  Procedure Date  . Back surgery   . Appendectomy   . Hernia repair   . Pacemaker insertion     2006  . Av fistula placement   . Aortic valve replacement 02/24/2011    Procedure: AORTIC VALVE REPLACEMENT (AVR);  Surgeon: Kathlee Nations Suann Larry, MD;  Location: [facility] OR;  Service: Open Heart Surgery;  Laterality: N/A;  . Coronary artery bypass graft 02/24/2011    Procedure: CORONARY ARTERY BYPASS GRAFTING (CABG);  Surgeon: Kathlee Nations Suann Larry, MD;  Location: [facility] OR;  Service: Open Heart Surgery;  Laterality: N/A;  Coranary artery bypass times two using left internal mammary artery and right greater saphenous vein   HPI:  78 y.o. male s/p for a cardiac cath on 02/20/11. Pt was intubated for one day on 2/4 for surgery involving an aortic valve replacement. Pt was on bipap 2/4-2/7. He reports no history of dysphagia, GERD, CVA , recent PNA.Marland Kitchen   Assessment/Recommendations/Treatment Plan  Patient presents with what appears to be a normal oropharyngeal swallow without indication of aspiration or dysphagia however patient's decreased respiratory status and need for increased oxygen do increase aspiration risk. During evaluation, oxygen levels consistently dropping between 87-88% with any activity. SLP will f/u to ensure diet tolerance both with current diet and with upgraded solids once MD feels appropriate to advance. Safe from SLP standpoint to advance to mechanical soft for energy conservation.  Risk for Aspiration: Mild Other Related Risk  Factors: Decreased respiratory status  Swallow Evaluation Recommendations Solid Consistency: No solids, see liquids (suspect will be safe for soft solids once ok with MD) Liquid Consistency: Thin (full liquid diet) Liquid Administration via: Cup;No straw Medication Administration: Whole meds with liquid Supervision: Patient able to self feed;Full supervision/cueing for compensatory strategies Compensations: Slow rate;Small sips/bites (Take pauses in between bites/sips)   Treatment Plan Speech Therapy Frequency: min 2x/week Treatment Duration: 2 weeks   Swallowing Goals  SLP Swallowing Goals Patient will consume recommended diet without observed clinical signs of aspiration with: Supervision/safety Swallow Study Goal #1 - Progress: Not Met Patient will utilize recommended strategies during swallow to increase swallowing safety with: Supervision/safety Swallow Study Goal #2 - Progress: Not met  Ferdinand Lango MA, CCC-SLP [phone]   Nathaniel Steele 02/27/2011,3:45 PM

## 2011-02-27 NOTE — Progress Notes (Signed)
Subjective: Awake, bipap mask off, sitting in chair 02/24/2011 CR 2.6  02/042013 CR 3.07  02/26/2011 CR 3.40 02/27/2011  CR 3.35 Objective: Vital signs in last 24 hours: Blood pressure 128/69, pulse 101, temperature 99.6 F (37.6 C), temperature source Oral, resp. rate 20, height 5\' 7"  (1.702 m), weight 84.1 kg (185 lb 6.5 oz), SpO2 94.00%.   Intake/Output from previous day: 02/06 0701 - 02/07 0700 In: 1519.2 [P.O.:120; I.V.:393.2; Blood:540; IV Piggyback:466] Out: 2110 [Urine:2030; Chest Tube:80] Intake/Output this shift: Total I/O In: 24.3 [I.V.:24.3] Out: 225 [Urine:125; Chest Tube:100] Wt Readings from Last 3 Encounters:  02/27/11 84.1 kg (185 lb 6.5 oz)  02/27/11 84.1 kg (185 lb 6.5 oz)  02/20/11 76.567 kg (168 lb 12.8 oz)  WEIGHT 76.6 kg on 4 Feb 81.6 kg on 5 Feb 83.5 kg on 6 Feb 84.1 kg on 7 Feb   PHYSICAL EXAM General--awake, friendly, sitting in chair, thinking clearly.   Says "You take [address] from [place] [place] to get to [facility]." Chest-- post op, rhonchi, pacer R subclav Heart--afib, no rub Abd--nontender Extr--AVF patent L forearm, 2+ pretib edema  Lab Results:   Lab 02/27/11 0408 02/26/11 1604 02/26/11 0315  NA 145 144 140  K 4.1 4.5 4.5  CL 111 110 108  CO2 25 23 24   BUN 65* 63* 59*  CREATININE 3.35* 3.55* 3.40*  EGFR -- -- --  GLUCOSE 114* -- --  CALCIUM 9.3 9.3 9.0  PHOS -- -- --      Basename 02/27/11 0408 02/26/11 1604  WBC 7.9 8.1  HGB 8.7* 9.0*  HCT 26.2* 26.8*  PLT 88* 69*     Basename 02/26/11 2100  IRON 21*  TIBC 233  FERRITIN 97     No results found for this basename: PTH in the last 72 hours    Assessment/Plan: 1. Chronic kidney disease-stage IV with worsening following AVR/CABG. Chest x-ray shows evidence of volume overload currently on furosemide 80 twice a day.Will increase to 160 BID.  No IVs no needlesticks left forearm (site of AV fistula)  2. S./P. CABG/AVR-per primary service  3. Anemia- iron studies indicate Fe  deficiency.  Will Rx IV Fe today, then aranesp 150/wk start tomorrow  4. Secondary hyperparathyroidism- Continue calcitriol 25 mcg/D.  5. Prior history atrial fibrillation-currently paced rhythm. Had been on Coumadin and amiodarone prior to admission  Pacemaker present right subclavian    LOS: 3 days   Miryah Ralls F 02/27/2011,9:07 AM   .labalb

## 2011-02-27 NOTE — Progress Notes (Signed)
3 Days Post-Op Procedure(s) (LRB): AORTIC VALVE REPLACEMENT (AVR) (N/A) CORONARY ARTERY BYPASS GRAFTING (CABG) (N/A)                      [address]            [name] [address]          [phone]     Subjective: *OOB to chair, urine output better Objective: Vital signs in last 24 hours: Temp:  [97.7 F (36.5 C)-99.6 F (37.6 C)] 99.6 F (37.6 C) (02/07 0736) Pulse Rate:  [87-111] 101  (02/07 0800) Cardiac Rhythm:  [-] Atrial fibrillation (02/07 0800) Resp:  [11-33] 20  (02/07 0800) BP: (99-163)/(56-105) 128/69 mmHg (02/07 0800) SpO2:  [85 %-100 %] 94 % (02/07 0800) Arterial Line BP: (119-164)/(52-72) 149/60 mmHg (02/07 0800) FiO2 (%):  [40 %-100 %] 80 % (02/07 0106) Weight:  [185 lb 6.5 oz (84.1 kg)] 185 lb 6.5 oz (84.1 kg) (02/07 0600)  Hemodynamic parameters for last 24 hours:   Stable afib Intake/Output from previous day: 02/06 0701 - 02/07 0700 In: 1519.2 [P.O.:120; I.V.:393.2; Blood:540; IV Piggyback:466] Out: 2110 [Urine:2030; Chest Tube:80] Intake/Output this shift: Total I/O In: 48.6 [I.V.:48.6] Out: 225 [Urine:125; Chest Tube:100]  EXAM  Neuro intact, incisions clear 2+ edema  Lab Results:  Basename 02/27/11 0408 02/26/11 1604  WBC 7.9 8.1  HGB 8.7* 9.0*  HCT 26.2* 26.8*  PLT 88* 69*   BMET:  Basename 02/27/11 0408 02/26/11 1604  NA 145 144  K 4.1 4.5  CL 111 110  CO2 25 23  GLUCOSE 114* 120*  BUN 65* 63*  CREATININE 3.35* 3.55*  CALCIUM 9.3 9.3    PT/INR:  Basename 02/26/11 0315  LABPROT 16.0*  INR 1.25   ABG    Component Value Date/Time   PHART 7.405 02/27/2011 0351   HCO3 23.3 02/27/2011 0351   TCO2 24 02/27/2011 0351   ACIDBASEDEF 1.0 02/27/2011 0351   O2SAT 94.0 02/27/2011 0351   CBG (last 3)   Basename 02/27/11 0734 02/27/11 0403 02/26/11 2339  GLUCAP 95 104* 114*    Assessment/Plan: S/P Procedure(s) (LRB): AORTIC VALVE REPLACEMENT (AVR) (N/A) CORONARY ARTERY BYPASS GRAFTING (CABG) (N/A) Start po,SLT eval  PT, wean dopa,no coumadin yet due to low plts   LOS: 3 days    VAN TRIGT III,Aleeha Boline 02/27/2011

## 2011-02-27 NOTE — Progress Notes (Signed)
Abbreviated Physical Therapy Note   PT session limited today by syncopal event. Vitals stable per monitor upon presentation and pt presents sitting upright in recliner with his wife present in the room. BP prior to standing was 128/69. Within seconds of pt standing he began to complain of "wooziness" and without warning pt's legs buckled and he needed a maxA to slowly sit in chair. Pt then slumped over for 1-2 seconds. RN called. Pt responding to questions appropriately. BP once seated 90/38. Pt sat for another 2 minutes. RN requested assist to transfer pt back to bed. He then transferred with 2 extra persons to gaurd and assist with lines but pt able to perform SPT modA. Did not complain of wooziness during transfer. Pt left supine in bed with family present. BP once supine 102/58. See full progress note for details concerning therapy recommendations.   Ivonne Andrew PT, DPT [phone]

## 2011-02-27 NOTE — Progress Notes (Signed)
[facility]  Subjective: "Sore"  Slept pretty good.  Objective: Vital signs in last 24 hours: Temp:  [97.7 F (36.5 C)-99.6 F (37.6 C)] 99.6 F (37.6 C) (02/07 0736) Pulse Rate:  [87-111] 97  (02/07 0600) Resp:  [11-33] 13  (02/07 0600) BP: (99-163)/(56-105) 135/58 mmHg (02/07 0456) SpO2:  [85 %-100 %] 93 % (02/07 0600) Arterial Line BP: (119-164)/(52-72) 156/66 mmHg (02/07 0600) FiO2 (%):  [40 %-100 %] 80 % (02/07 0106) Weight:  [84.1 kg (185 lb 6.5 oz)] 84.1 kg (185 lb 6.5 oz) (02/07 0600)    Intake/Output from previous day: 02/06 0701 - 02/07 0700 In: 1494.9 [P.O.:120; I.V.:368.9; Blood:540; IV Piggyback:466] Out: 2110 [Urine:2030; Chest Tube:80] Intake/Output this shift:    Medications Current Facility-Administered Medications  Medication Dose Route Frequency Provider Last Rate Last Dose  . 0.9 %  sodium chloride infusion  250 mL Intravenous Continuous Kathlee Nations Trigt III, MD 20 mL/hr at 02/27/11 0600 250 mL at 02/27/11 0600  . acetaminophen (OFIRMEV) IV 1,000 mg  1,000 mg Intravenous Q6H Kathlee Nations Trigt III, MD   1,000 mg at 02/26/11 [phone]  . bisacodyl (DULCOLAX) EC tablet 10 mg  10 mg Oral Daily Kathlee Nations Trigt III, MD   10 mg at 02/26/11 1344   Or  . bisacodyl (DULCOLAX) suppository 10 mg  10 mg Rectal Daily Kathlee Nations Trigt III, MD   10 mg at 02/25/11 1559  . calcitRIOL (ROCALTROL) capsule 0.25 mcg  0.25 mcg Oral Daily Zada Girt, MD   0.25 mcg at 02/26/11 1713  . Chlorhexidine Gluconate Cloth 2 % PADS 6 each  6 each Topical Daily Kerin Perna III, MD   6 each at 02/26/11 1421  . docusate sodium (COLACE) capsule 200 mg  200 mg Oral Daily Kathlee Nations Trigt III, MD   200 mg at 02/26/11 1344  . DOPamine (INTROPIN) 800 mg in dextrose 5 % 250 mL infusion  2.5 mcg/kg/min Intravenous Continuous Kathlee Nations Trigt III, MD 4.3 mL/hr at 02/27/11 0600 2.994 mcg/kg/min at 02/27/11 0600  . fentaNYL (SUBLIMAZE) injection 25 mcg  25 mcg Intravenous Q2H  PRN Kathlee Nations Trigt III, MD   25 mcg at 02/26/11 1713  . furosemide (LASIX) injection 80 mg  80 mg Intravenous BID Kathlee Nations Trigt III, MD   80 mg at 02/26/11 1753  . insulin aspart (novoLOG) injection 0-24 Units  0-24 Units Subcutaneous Q4H Kerin Perna III, MD   2 Units at 02/26/11 1345  . metoprolol (LOPRESSOR) injection 2.5-5 mg  2.5-5 mg Intravenous Q2H PRN Kathlee Nations Trigt III, MD      . metoprolol tartrate (LOPRESSOR) tablet 12.5 mg  12.5 mg Oral BID Kathlee Nations Trigt III, MD   12.5 mg at 02/26/11 1344   Or  . metoprolol tartrate (LOPRESSOR) 25 mg/10 mL oral suspension 12.5 mg  12.5 mg Per Tube BID Kathlee Nations Trigt III, MD      . moxifloxacin (AVELOX) IVPB 400 mg  400 mg Intravenous Q24H Kathlee Nations Trigt III, MD   400 mg at 02/26/11 0935  . mupirocin ointment (BACTROBAN) 2 % 1 application  1 application Nasal BID Kerin Perna III, MD   1 application at 02/26/11 2259  . ondansetron (ZOFRAN) injection 4 mg  4 mg Intravenous Q6H PRN Kathlee Nations Trigt III, MD   4 mg at 02/25/11 1119  . pantoprazole (PROTONIX) EC tablet 40 mg  40 mg Oral Q1200 Kathlee Nations Trigt III,  MD   40 mg at 02/26/11 1344  . DISCONTD: sodium chloride 0.9 % injection 3 mL  3 mL Intravenous Q12H Kathlee Nations Trigt III, MD   30 mL at 02/25/11 2259  . DISCONTD: sodium chloride 0.9 % injection 3 mL  3 mL Intravenous PRN Kathlee Nations Trigt III, MD        PE: General appearance: alert, cooperative and no distress Lungs: clear to auscultation bilaterally Heart: irregularly irregular rhythm Extremities: 1+ LEE. Pulses: Radials 2+ and symmetric,  1+ DPs.  Lab Results:   Basename 02/27/11 0408 02/26/11 1604 02/26/11 0315  WBC 7.9 8.1 6.9  HGB 8.7* 9.0* 7.9*  HCT 26.2* 26.8* 23.5*  PLT 88* 69* 80*   BMET  Basename 02/27/11 0408 02/26/11 1604 02/26/11 0315  NA 145 144 140  K 4.1 4.5 4.5  CL 111 110 108  CO2 25 23 24   GLUCOSE 114* 120* 121*  BUN 65* 63* 59*  CREATININE 3.35* 3.55* 3.40*  CALCIUM 9.3 9.3 9.0    PT/INR  Basename 02/26/11 0315 02/25/11 0248 02/24/11 1525  LABPROT 16.0* 18.6* 18.3*  INR 1.25 1.52* 1.49    Studies/Results: PORTABLE CHEST - 1 VIEW  Comparison: Portable chest x-ray of 02/26/2011  Findings: The lungs remain poorly aerated with basilar atelectasis  and probable left pleural effusion. A left chest tube is present  and no pneumothorax is seen. Left central venous line, right  central venous, and permanent pacemaker remain. Cardiomegaly is  stable.  IMPRESSION:  No change in poor aeration and basilar atelectasis with probable  left effusion. No change in left chest tube and central venous  lines.   Assessment/Plan  Active Problems:  Chronic renal failure, pt has AVF LUE  SLE (systemic lupus erythematosus)  S/P CABG x 2: (LIMA-LAD  SVG - RCA)  Severe AS, tissue AVR this admission  PAF (paroxysmal atrial fibrillation), on Amiodarone prior to admission  Pacemaker, MDT MDT implanted 08/06  Chronic anticoagulation, on Coumadin prior to admission  Anemia, post op, Hgb 7.9  Respiratory failure, hypoxia post op, on bipap currently   Plan:  S/P post one unit PRBCs/plt.  Hgb slight decrease.  BP 99/65-163/64.  Net +1362mls yesterday.  Intermitantly pacing.  Still in Afib borderline controlled rate.  On Dopamine and lopressor, lasix.   LOS: 3 days    HAGER,BRYAN W 02/27/2011 8:41 AM  I have seen and examined the patient along with Dwana Melena, PA.  I have reviewed the chart, notes and new data.  I agree with PA's note.  Key new complaints: Feels "rough", but pain is well controlled and no dyspnea Key examination changes: AFib with VR in 90s. No longer pacing  Key new findings / data: creat stabilized around 3.3  PLAN: Wean off dopamine. Resume amiodarone PO. Resume anticoagulation once chest tubes are out.  Thurmon Fair, MD, [facility] [phone] 02/27/2011, 10:42 AM

## 2011-02-27 NOTE — Progress Notes (Signed)
TCTS BRIEF SICU PROGRESS NOTE  3 Days Post-Op  S/P Procedure(s) (LRB): AORTIC VALVE REPLACEMENT (AVR) (N/A) CORONARY ARTERY BYPASS GRAFTING (CABG) (N/A)   More short of breath this afternoon Currently breathing comfortably on 100% facemask Afib w/ HR 100 and occasional runs WCT that is likely abberent conduction BP stable UOP excellent  Plan: Will restart amiodarone  OWEN,CLARENCE H 02/27/2011 9:11 PM

## 2011-02-27 NOTE — Progress Notes (Signed)
Physical Therapy Treatment Patient Details Name: Nathaniel Steele MRN: [id number] DOB: 04/02/1926 Today's Date: 02/27/2011  PT Assessment/Plan  PT - Assessment/Plan Comments on Treatment Session: Pt's session today limited by syncopal event. Per chart and RN pt much better today. Vitals stable per monitor upon presentation and pt presents sitting upright in recliner with his wife present in the room. BP prior to standing was 128/69. Within seconds of pt standing he began to complain of "wooziness" and without warning pt's legs buckled and he needed a maxA to slowly sit in chair. Pt then slumped over for 1-2 seconds. RN called. Pt responding to questions appropriately. BP once seated 90/38. Pt sat for another 2 minutes. RN requested assist to transfer pt back to bed. He then transferred with 2 extra persons to gaurd and assist with lines but pt able to perform SPT modA. Did not complain of wooziness during transfer. My recommendations will depend on pt's progress but at this time I would recommend continued therapies prior to pt going home with his wife. CIR vs SNF at this point. If pt able to progress well he may be able to d/c home.  I would also recommend an OT consult.  PT Plan: Discharge plan needs to be updated;Frequency remains appropriate PT Frequency: Min 3X/week Recommendations for Other Services: OT consult Follow Up Recommendations: Inpatient Rehab Equipment Recommended: Defer to next venue PT Goals  Acute Rehab PT Goals PT Goal: Rolling Supine to Right Side - Progress: Progressing toward goal PT Goal: Rolling Supine to Left Side - Progress: Progressing toward goal PT Goal: Supine/Side to Sit - Progress: Progressing toward goal PT Goal: Sit at Edge Of Bed - Progress: Progressing toward goal PT Goal: Sit to Stand - Progress: Progressing toward goal PT Goal: Stand to Sit - Progress: Progressing toward goal PT Transfer Goal: Bed to Chair/Chair to Bed - Progress: Progressing toward goal PT  Goal: Ambulate - Progress: Not progressing (orthostasis and syncope) Pt will Perform Home Exercise Program: Independently PT Goal: Perform Home Exercise Program - Progress: Progressing toward goal  PT Treatment Precautions/Restrictions  Precautions Precautions: Sternal;Fall Precaution Comments: pt with syncopal episode during our session today with standing Restrictions Weight Bearing Restrictions: No Mobility (including Balance) Bed Mobility Bed Mobility:  (pt upright in chair today ) Sit to Supine: 1: +2 Total assist (50%) Sit to Supine - Details (indicate cue type and reason): assist to elevate feet to bed and guide shoulders down Transfers Transfers: Yes Sit to Stand: 3: Mod assist;With upper extremity assist;From chair/3-in-1 Sit to Stand Details (indicate cue type and reason): modA for anterior translation and faciliation for power up to stand; pt initially standing with trunk flexion and downward gaze; upon standing for less than 10 seconds pt c/o feeling "woozy" and without warning bilateral LE buckled and pt assisted to chair (maxA) Stand to Sit: 2: Max assist Stand to Sit Details: pt with episode of syncope after 10-15 seconds of standing needing maxA to slowly bring pt to chair Stand Pivot Transfers: 1: +2 Total assist (70%) Stand Pivot Transfer Details (indicate cue type and reason): increased assistance (+3 present) secondary to prior syncopal event but pt more modA for transfer chair->bed with sequencing cues for feet placement Ambulation/Gait Ambulation/Gait: No (pt with syncopal episode; gait deferred today)  Posture/Postural Control Posture/Postural Control: No significant limitations Balance Balance Assessed: No Exercise  Total Joint Exercises Ankle Circles/Pumps: AROM;Both;10 reps;Seated End of Session PT - End of Session Equipment Utilized During Treatment: Gait belt Activity Tolerance: Treatment  limited secondary to medical complications (Comment) (syncopal  event (pt's BP dropped 128/69->90/38 with stand) Patient left: in bed;with call bell in reach;with family/visitor present Nurse Communication: Mobility status for transfers (notified of syncopal event and RN came to check on pt) General Behavior During Session: Flat affect Cognition: [facility] for tasks performed (less conversant today)  WHITLOW,Kellie Chisolm HELEN 02/27/2011, 12:17 PM

## 2011-02-28 ENCOUNTER — Encounter: Payer: Self-pay | Admitting: Cardiothoracic Surgery

## 2011-02-28 ENCOUNTER — Other Ambulatory Visit: Payer: Self-pay

## 2011-02-28 ENCOUNTER — Inpatient Hospital Stay (HOSPITAL_COMMUNITY): Payer: Medicare Other

## 2011-02-28 LAB — URINALYSIS, ROUTINE W REFLEX MICROSCOPIC
Bilirubin Urine: NEGATIVE
Glucose, UA: NEGATIVE mg/dL
Ketones, ur: NEGATIVE mg/dL
Nitrite: POSITIVE — AB
Protein, ur: NEGATIVE mg/dL
Specific Gravity, Urine: 1.015 (ref 1.005–1.030)
Urobilinogen, UA: 0.2 mg/dL (ref 0.0–1.0)
pH: 5 (ref 5.0–8.0)

## 2011-02-28 LAB — GLUCOSE, CAPILLARY
Glucose-Capillary: 100 mg/dL — ABNORMAL HIGH (ref 70–99)
Glucose-Capillary: 101 mg/dL — ABNORMAL HIGH (ref 70–99)
Glucose-Capillary: 108 mg/dL — ABNORMAL HIGH (ref 70–99)
Glucose-Capillary: 108 mg/dL — ABNORMAL HIGH (ref 70–99)
Glucose-Capillary: 115 mg/dL — ABNORMAL HIGH (ref 70–99)
Glucose-Capillary: 134 mg/dL — ABNORMAL HIGH (ref 70–99)

## 2011-02-28 LAB — CBC
HCT: 25.7 % — ABNORMAL LOW (ref 39.0–52.0)
Hemoglobin: 8.5 g/dL — ABNORMAL LOW (ref 13.0–17.0)
MCH: 29.4 pg (ref 26.0–34.0)
MCHC: 33.1 g/dL (ref 30.0–36.0)
MCV: 88.9 fL (ref 78.0–100.0)
Platelets: 74 10*3/uL — ABNORMAL LOW (ref 150–400)
RBC: 2.89 MIL/uL — ABNORMAL LOW (ref 4.22–5.81)
RDW: 16.3 % — ABNORMAL HIGH (ref 11.5–15.5)
WBC: 6.4 10*3/uL (ref 4.0–10.5)

## 2011-02-28 LAB — BASIC METABOLIC PANEL
BUN: 60 mg/dL — ABNORMAL HIGH (ref 6–23)
CO2: 27 mEq/L (ref 19–32)
Calcium: 9.3 mg/dL (ref 8.4–10.5)
Chloride: 113 mEq/L — ABNORMAL HIGH (ref 96–112)
Creatinine, Ser: 2.85 mg/dL — ABNORMAL HIGH (ref 0.50–1.35)
GFR calc Af Amer: 22 mL/min — ABNORMAL LOW (ref >90)
GFR calc non Af Amer: 19 mL/min — ABNORMAL LOW (ref >90)
Glucose, Bld: 115 mg/dL — ABNORMAL HIGH (ref 70–99)
Potassium: 3.3 mEq/L — ABNORMAL LOW (ref 3.5–5.1)
Sodium: 149 mEq/L — ABNORMAL HIGH (ref 135–145)

## 2011-02-28 LAB — URINE MICROSCOPIC-ADD ON

## 2011-02-28 MED ORDER — PHENAZOPYRIDINE HCL 100 MG PO TABS
100.0000 mg | ORAL_TABLET | Freq: Three times a day (TID) | ORAL | Status: DC
  Administered 2011-02-28 – 2011-03-03 (×10): 100 mg via ORAL
  Filled 2011-02-28 (×13): qty 1

## 2011-02-28 MED ORDER — HYDROCODONE-ACETAMINOPHEN 5-325 MG PO TABS
1.0000 | ORAL_TABLET | ORAL | Status: DC | PRN
  Administered 2011-02-28 – 2011-03-04 (×5): 1 via ORAL
  Administered 2011-03-06: 2 via ORAL
  Administered 2011-03-06: 1 via ORAL
  Administered 2011-03-06 – 2011-03-07 (×2): 2 via ORAL
  Administered 2011-03-07 – 2011-03-09 (×2): 1 via ORAL
  Filled 2011-02-28: qty 2
  Filled 2011-02-28 (×4): qty 1
  Filled 2011-02-28: qty 2
  Filled 2011-02-28 (×4): qty 1
  Filled 2011-02-28: qty 2

## 2011-02-28 MED ORDER — INSULIN ASPART 100 UNIT/ML ~~LOC~~ SOLN
0.0000 [IU] | Freq: Three times a day (TID) | SUBCUTANEOUS | Status: DC
  Administered 2011-03-02 – 2011-03-05 (×2): 2 [IU] via SUBCUTANEOUS

## 2011-02-28 MED ORDER — POTASSIUM CHLORIDE 10 MEQ/50ML IV SOLN
INTRAVENOUS | Status: AC
  Administered 2011-02-28: 10 meq via INTRAVENOUS
  Filled 2011-02-28: qty 50

## 2011-02-28 MED ORDER — POTASSIUM CHLORIDE 10 MEQ/50ML IV SOLN
10.0000 meq | INTRAVENOUS | Status: AC
  Administered 2011-02-28 (×6): 10 meq via INTRAVENOUS
  Filled 2011-02-28: qty 250

## 2011-02-28 MED ORDER — FUROSEMIDE 10 MG/ML IJ SOLN
160.0000 mg | Freq: Two times a day (BID) | INTRAVENOUS | Status: AC
  Administered 2011-02-28 – 2011-03-01 (×2): 160 mg via INTRAVENOUS
  Filled 2011-02-28 (×2): qty 16

## 2011-02-28 MED ORDER — CIPROFLOXACIN HCL 250 MG PO TABS
250.0000 mg | ORAL_TABLET | Freq: Two times a day (BID) | ORAL | Status: DC
  Administered 2011-02-28 – 2011-03-04 (×8): 250 mg via ORAL
  Filled 2011-02-28 (×10): qty 1

## 2011-02-28 MED ORDER — FERUMOXYTOL INJECTION 510 MG/17 ML
510.0000 mg | Freq: Once | INTRAVENOUS | Status: AC
  Administered 2011-03-02: 510 mg via INTRAVENOUS
  Filled 2011-02-28: qty 17

## 2011-02-28 MED ORDER — FUROSEMIDE 10 MG/ML IJ SOLN
160.0000 mg | Freq: Two times a day (BID) | INTRAVENOUS | Status: AC
  Administered 2011-02-28: 160 mg via INTRAVENOUS
  Filled 2011-02-28: qty 16

## 2011-02-28 MED ORDER — INSULIN ASPART 100 UNIT/ML ~~LOC~~ SOLN: 0.0000 [IU] | Freq: Every day | SUBCUTANEOUS | Status: DC

## 2011-02-28 MED ORDER — SODIUM CHLORIDE 0.9 % IJ SOLN
10.0000 mL | Freq: Two times a day (BID) | INTRAMUSCULAR | Status: DC
  Administered 2011-02-28 – 2011-03-08 (×17): 10 mL via INTRAVENOUS
  Administered 2011-03-08: 3 mL via INTRAVENOUS
  Administered 2011-03-09: 10 mL via INTRAVENOUS
  Administered 2011-03-09: 3 mL via INTRAVENOUS

## 2011-02-28 NOTE — Progress Notes (Signed)
[facility]  Subjective: He complains to burning when he urinates. Ambulated this AM  Objective: Vital signs in last 24 hours: Temp:  [98.4 F (36.9 C)-100.5 F (38.1 C)] 98.4 F (36.9 C) (02/08 0739) Pulse Rate:  [75-100] 90  (02/08 0700) Resp:  [15-28] 20  (02/08 0700) BP: (90-149)/(38-110) 124/79 mmHg (02/08 0700) SpO2:  [85 %-98 %] 98 % (02/08 0700) Arterial Line BP: (150)/(59) 150/59 mmHg (02/07 0900) FiO2 (%):  [100 %] 100 % (02/08 0000) Weight:  [80.4 kg (177 lb 4 oz)] 80.4 kg (177 lb 4 oz) (02/08 0700)    Intake/Output from previous day: 02/07 0701 - 02/08 0700 In: 1936 [P.O.:960; I.V.:910; IV Piggyback:66] Out: 3765 [Urine:3425; Chest Tube:340] Intake/Output this shift:    Medications Current Facility-Administered Medications  Medication Dose Route Frequency Provider Last Rate Last Dose  . 0.9 %  sodium chloride infusion  250 mL Intravenous Continuous Kathlee Nations Trigt III, MD 20 mL/hr at 02/27/11 1600 250 mL at 02/27/11 1600  . amiodarone (NEXTERONE PREMIX) 360 mg/200 mL dextrose 360 MG/200ML IV infusion           . amiodarone (NEXTERONE PREMIX) 360 mg/200 mL dextrose IV infusion  60 mg/hr Intravenous Continuous Purcell Nails, MD 33.3 mL/hr at 02/27/11 2100 60 mg/hr at 02/27/11 2100  . amiodarone (PACERONE) tablet 200 mg  200 mg Oral Daily Mihai Croitoru, MD   200 mg at 02/27/11 1219  . bisacodyl (DULCOLAX) EC tablet 10 mg  10 mg Oral Daily Kathlee Nations Trigt III, MD   10 mg at 02/27/11 1039   Or  . bisacodyl (DULCOLAX) suppository 10 mg  10 mg Rectal Daily Kathlee Nations Trigt III, MD   10 mg at 02/25/11 1559  . calcitRIOL (ROCALTROL) capsule 0.25 mcg  0.25 mcg Oral Daily Zada Girt, MD   0.25 mcg at 02/27/11 1038  . Chlorhexidine Gluconate Cloth 2 % PADS 6 each  6 each Topical Daily Kathlee Nations Trigt III, MD   6 each at 02/27/11 1000  . darbepoetin (ARANESP) injection 150 mcg  150 mcg Subcutaneous Q Fri-1800 Zada Girt, MD      .  docusate sodium (COLACE) capsule 200 mg  200 mg Oral Daily Kathlee Nations Trigt III, MD   200 mg at 02/27/11 1039  . DOPamine (INTROPIN) 800 mg in dextrose 5 % 250 mL infusion  2.5 mcg/kg/min Intravenous Continuous Kathlee Nations Trigt III, MD 2.9 mL/hr at 02/27/11 1900 2 mcg/kg/min at 02/27/11 1900  . fentaNYL (SUBLIMAZE) injection 25 mcg  25 mcg Intravenous Q2H PRN Kathlee Nations Trigt III, MD   25 mcg at 02/28/11 0715  . ferumoxytol [facility]) injection 510 mg  510 mg Intravenous Once Zada Girt, MD   510 mg at 02/27/11 1000  . furosemide (LASIX) 160 mg in dextrose 5 % 50 mL IVPB  160 mg Intravenous BID Zada Girt, MD   160 mg at 02/27/11 1700  . insulin aspart (novoLOG) injection 0-24 Units  0-24 Units Subcutaneous Q4H Kerin Perna III, MD   2 Units at 02/27/11 1651  . metoprolol (LOPRESSOR) injection 2.5-5 mg  2.5-5 mg Intravenous Q2H PRN Kathlee Nations Trigt III, MD      . metoprolol tartrate (LOPRESSOR) tablet 12.5 mg  12.5 mg Oral BID Kathlee Nations Trigt III, MD   12.5 mg at 02/27/11 2130   Or  . metoprolol tartrate (LOPRESSOR) 25 mg/10 mL oral suspension 12.5 mg  12.5 mg Per Tube  BID Kathlee Nations Trigt III, MD      . mupirocin ointment (BACTROBAN) 2 % 1 application  1 application Nasal BID Kathlee Nations Maudie Flakes III, MD   1 application at 02/27/11 2230  . ondansetron (ZOFRAN) injection 4 mg  4 mg Intravenous Q6H PRN Kathlee Nations Trigt III, MD   4 mg at 02/25/11 1119  . pantoprazole (PROTONIX) EC tablet 40 mg  40 mg Oral Q1200 Kerin Perna III, MD   40 mg at 02/27/11 1219  . phenazopyridine (PYRIDIUM) tablet 100 mg  100 mg Oral TID WC Kerin Perna III, MD      . DISCONTD: furosemide (LASIX) injection 80 mg  80 mg Intravenous BID Kathlee Nations Trigt III, MD   80 mg at 02/26/11 1753    PE: General appearance: alert, cooperative, no distress and He was sleeping when I entered.  Lungs: clear to auscultation bilaterally Heart: regular rate and rhythm, S1, S2 normal, no murmur, click, rub or gallop Extremities: 1+  LEE Pulses: Radials 2+ and symmetric, 2+ left PT, 1+ right DP.  RLE 1-2+ edema > L ~trace edema Neuro: baseline tremor (RUE most notable) See by MD after completing 100 feet ambulation.  Maintaining SaO2 of 92 % on [place] Chest tube still with serosanguinous output.  Lab Results:   Basename 02/28/11 0300 02/27/11 0408 02/26/11 1604  WBC 6.4 7.9 8.1  HGB 8.5* 8.7* 9.0*  HCT 25.7* 26.2* 26.8*  PLT 74* 88* 69*   BMET  Basename 02/28/11 0300 02/27/11 0408 02/26/11 1604  NA 149* 145 144  K 3.3* 4.1 4.5  CL 113* 111 110  CO2 27 25 23   GLUCOSE 115* 114* 120*  BUN 60* 65* 63*  CREATININE 2.85* 3.35* 3.55*  CALCIUM 9.3 9.3 9.3   PT/INR  Basename 02/26/11 0315  LABPROT 16.0*  INR 1.25    PORTABLE CHEST - 1 VIEW  Comparison: Yesterday  Findings: Stable left chest tube and left subclavian central venous catheter. Dual lead right subclavian pacemaker device is stable.  No pneumothorax. Left basilar opacity, a combination of pleural effusion and volume loss is stable. Right basilar atelectasis  improved.  IMPRESSION:  Improved right base atelectasis. Stable left effusion and basilar opacity.  Assessment/Plan  Active Problems:  Chronic renal failure, pt has AVF LUE  SLE (systemic lupus erythematosus)  S/P CABG x 2: (LIMA-LAD  SVG - RCA)  Severe AS, tissue AVR this admission  PAF (paroxysmal atrial fibrillation), on Amiodarone prior to admission  Pacemaker, MDT MDT implanted 08/06  Chronic anticoagulation, on Coumadin prior to admission  Anemia, post op, Hgb 7.9  Respiratory failure, hypoxia post op, on bipap currently  Plan:  Diuresed net -1770.60ml.  Some improvement in creatinine, but K down.  .   Put on IV Amiodarone over night - would convert to PO once load complete (he has Paroxysmal Afib) Maintaining ventricular rhythm at 91bpm UA/culture ordered by CTS.   BP 90/38 - 149/73 Low / Renal Dose Dopamine continues - with High dose Lasix per Nephrology   LOS: 4 days     HAGER,BRYAN W 02/28/2011 8:15 AM  I have seen and examined the patient along with Wilburt Finlay, PA.  I have reviewed the chart, notes and new data.  I agree with Bryan's note. POS #4 - s/p AVR/CABG  Key new complaints: as per above with urinary discomfort (doesn't like Foley); no sensation of palpitations Key examination changes: edema improved, mental status improved Key new findings / data: K 3.3,  Cr down to 2.85; Hgb down to 8.5  PLAN: Would convert Amiodarone to PO once IV load complete, continue low dose BB Resume anticoagulation once CT removed  HARDING,DAVID W, M.D., M.S. [facility] [address] [place], [place]  [address]  [phone]  02/28/2011 9:53 AM

## 2011-02-28 NOTE — Progress Notes (Signed)
Subjective: Awake, alert, wife and niece at bedside 02/24/2011 CR 2.6  02/042013 CR 3.07  02/26/2011 CR 3.40  02/27/2011 CR 3.35 02/28/2011 Cr 2.85 Dopamine @2mcg /kg/min  Objective: Vital signs in last 24 hours: Blood pressure 124/79, pulse 90, temperature 98.4 F (36.9 C), temperature source Oral, resp. rate 20, height 5\' 7"  (1.702 m), weight 80.4 kg (177 lb 4 oz), SpO2 98.00%.   Intake/Output from previous day: 02/07 0701 - 02/08 0700 In: 1936 [P.O.:960; I.V.:910; IV Piggyback:66] Out: 3765 [Urine:3425; Chest Tube:340] Intake/Output this shift: Total I/O In: 39.6 [I.V.:39.6] Out: 125 [Urine:125] Wt Readings from Last 3 Encounters:  02/28/11 80.4 kg (177 lb 4 oz)  02/28/11 80.4 kg (177 lb 4 oz)  02/20/11 76.567 kg (168 lb 12.8 oz)  WEIGHT  76.6 kg on 4 Feb  81.6 kg on 5 Feb  83.5 kg on 6 Feb  84.1 kg on 7 Feb 80.4 kg on 8 Feb    PHYSICAL EXAM General--awake, sitting in chair.  Walked around earlier today Chest--post op sternotomy.  L pl tube still in Heart--no rub Abd--nontender Extr--AVF patent, still with 2+ pretib edema  Lab Results:   Lab 02/28/11 0300 02/27/11 0408 02/26/11 1604  NA 149* 145 144  K 3.3* 4.1 4.5  CL 113* 111 110  CO2 27 25 23   BUN 60* 65* 63*  CREATININE 2.85* 3.35* 3.55*  EGFR -- -- --  GLUCOSE 115* -- --  CALCIUM 9.3 9.3 9.3  PHOS -- -- --      Basename 02/28/11 0300 02/27/11 0408  WBC 6.4 7.9  HGB 8.5* 8.7*  HCT 25.7* 26.2*  PLT 74* 88*     Basename 02/26/11 2100  IRON 21*  TIBC 233  FERRITIN 97     No results found for this basename: PTH in the last 72 hours   Scheduled:   . amiodarone (NEXTERONE PREMIX) 360 mg/200 mL dextrose      . amiodarone  200 mg Oral Daily  . bisacodyl  10 mg Oral Daily   Or  . bisacodyl  10 mg Rectal Daily  . calcitRIOL  0.25 mcg Oral Daily  . Chlorhexidine Gluconate Cloth  6 each Topical Daily  . darbepoetin (ARANESP) injection - NON-DIALYSIS  150 mcg Subcutaneous Q Fri-1800  .  docusate sodium  200 mg Oral Daily  . ferumoxytol  510 mg Intravenous Once  . furosemide  160 mg Intravenous BID  . furosemide  160 mg Intravenous BID  . insulin aspart  0-24 Units Subcutaneous Q4H  . metoprolol tartrate  12.5 mg Oral BID   Or  . metoprolol tartrate  12.5 mg Per Tube BID  . mupirocin ointment  1 application Nasal BID  . pantoprazole  40 mg Oral Q1200  . phenazopyridine  100 mg Oral TID WC  . potassium chloride  10 mEq Intravenous Q1 Hr x 6   Continuous:   . sodium chloride 250 mL (02/27/11 1600)  . amiodarone (NEXTERONE PREMIX) 360 mg/200 mL dextrose 60 mg/hr (02/27/11 2100)  . DOPamine 2 mcg/kg/min (02/27/11 1900)    Assessment/Plan: 1. Chronic kidney disease-stage IV with worsening following AVR/CABG. Chest x-ray bibasilar atel and L pl eff.  On furosemide 160 BID. No IVs no needlesticks left forearm (site of AV fistula) .  IV KCl, check Mg in AM 2. S./P CABG/AVR-per primary service  3. Anemia- iron studies indicate Fe deficiency.  IV Feraheme 510 mg again on 10 Feb ,aranesp 150/wk start today  4. Secondary hyperparathyroidism- Continue calcitriol 25  mcg/D.  5. Prior history atrial fibrillation-currently paced rhythm. Had been on Coumadin and amiodarone prior to admission  Pacemaker present right subclavian     LOS: 4 days   Ilda Laskin F 02/28/2011,10:30 AM   .labalb

## 2011-02-28 NOTE — Progress Notes (Signed)
UR Completed.  Afomia Blackley Jane [phone] 02/28/2011  

## 2011-02-28 NOTE — Progress Notes (Signed)
4 Days Post-Op Procedure(s) (LRB): AORTIC VALVE REPLACEMENT (AVR) (N/A) CORONARY ARTERY BYPASS GRAFTING (CABG) (N/A)                     [address]            [name] [address]          [phone]   Subjective: OOB to chair  IV amio for a-fib  Objective: Vital signs in last 24 hours: Temp:  [98.4 F (36.9 C)-100.5 F (38.1 C)] 98.4 F (36.9 C) (02/08 0739) Pulse Rate:  [75-101] 90  (02/08 0700) Cardiac Rhythm:  [-] Atrial fibrillation (02/08 0300) Resp:  [15-28] 20  (02/08 0700) BP: (90-149)/(38-110) 124/79 mmHg (02/08 0700) SpO2:  [85 %-98 %] 98 % (02/08 0700) Arterial Line BP: (149-150)/(59-60) 150/59 mmHg (02/07 0900) FiO2 (%):  [100 %] 100 % (02/08 0000) Weight:  [177 lb 4 oz (80.4 kg)] 177 lb 4 oz (80.4 kg) (02/08 0700)  Hemodynamic parameters for last 24 hours:  current NSR  Intake/Output from previous day: 02/07 0701 - 02/08 0700 In: 1936 [P.O.:960; I.V.:910; IV Piggyback:66] Out: 3765 [Urine:3425; Chest Tube:340] Intake/Output this shift:    Clear lungs,2+ edema  Lab Results:  Basename 02/28/11 0300 02/27/11 0408  WBC 6.4 7.9  HGB 8.5* 8.7*  HCT 25.7* 26.2*  PLT 74* 88*   BMET:  Basename 02/28/11 0300 02/27/11 0408  NA 149* 145  K 3.3* 4.1  CL 113* 111  CO2 27 25  GLUCOSE 115* 114*  BUN 60* 65*  CREATININE 2.85* 3.35*  CALCIUM 9.3 9.3    PT/INR:  Basename 02/26/11 0315  LABPROT 16.0*  INR 1.25   ABG    Component Value Date/Time   PHART 7.405 02/27/2011 0351   HCO3 23.3 02/27/2011 0351   TCO2 24 02/27/2011 0351   ACIDBASEDEF 1.0 02/27/2011 0351   O2SAT 94.0 02/27/2011 0351   CBG (last 3)   Basename 02/28/11 0738 02/28/11 0404 02/27/11 2350  GLUCAP 108* 101* 115*    Assessment/Plan: S/P Procedure(s) (LRB): AORTIC VALVE REPLACEMENT (AVR) (N/A) CORONARY ARTERY BYPASS GRAFTING (CABG) (N/A) Cont lasix per renal Leave Pl tube -330cc output yesterday   LOS: 4 days    VAN TRIGT III,Kyanne Rials 02/28/2011

## 2011-02-28 NOTE — Progress Notes (Signed)
Speech Pathology: Dysphagia Treatment Note  Patient was observed with : thin liquids Patient was noted to have s/s of aspiration : No  Lung Sounds:  Clear upper, diminished lower lobes Temperature: 98.4  Clinical Impression: Patient seen for f/u skilled observation with pos after bedside swallowing evaluation complete on 2/7. Non-rebreather mask in place upon arrival to room however RN transitioned over to nasal cannula prior to beginning am meal. Patient able to maintain O2 saturation levels above 90% for entire meal with the exception of a one time drop to 89% during conversations. SLP provided verbal cueing for pacing and breaks both during conversation and with po intake to maintain O2 levels and decrease risk of aspiration events related to decreased apneic period increasing risk for decreased airway protection. Patient verbalized and demonstrated understanding of education. Overall, oropharyngeal swallow appears to remain [facility] without overt s/s of aspiration observed during po intake. Noted plans for f/u CXR today. Given increased oxygen needs, SLP will continue to f/u briefly to ensure continued diet tolerance, especially when diet is advanced to include solids as this may further fatigue patient during meals.   Recommendations:  1. Continue current diet. Advance to mechanical soft (for energy conservation), thin liquid when ok with MD from GI standpoint. 2. Continue aspiration precautions 3. Would recommend holding pos if O2 requirements are requiring oxygen needs above what a nasal cannula can provide.   Pain:   none Intervention Required:   No   Goals: Progressing  Ferdinand Lango MA, CCC-SLP [phone]

## 2011-03-01 ENCOUNTER — Inpatient Hospital Stay (HOSPITAL_COMMUNITY): Payer: Medicare Other

## 2011-03-01 LAB — BASIC METABOLIC PANEL
BUN: 52 mg/dL — ABNORMAL HIGH (ref 6–23)
CO2: 28 mEq/L (ref 19–32)
Calcium: 9.7 mg/dL (ref 8.4–10.5)
Chloride: 108 mEq/L (ref 96–112)
Creatinine, Ser: 2.48 mg/dL — ABNORMAL HIGH (ref 0.50–1.35)
GFR calc Af Amer: 26 mL/min — ABNORMAL LOW (ref >90)
GFR calc non Af Amer: 23 mL/min — ABNORMAL LOW (ref >90)
Glucose, Bld: 98 mg/dL (ref 70–99)
Potassium: 3.6 mEq/L (ref 3.5–5.1)
Sodium: 144 mEq/L (ref 135–145)

## 2011-03-01 LAB — GLUCOSE, CAPILLARY
Glucose-Capillary: 92 mg/dL (ref 70–99)
Glucose-Capillary: 114 mg/dL — ABNORMAL HIGH (ref 70–99)
Glucose-Capillary: 101 mg/dL — ABNORMAL HIGH (ref 70–99)
Glucose-Capillary: 100 mg/dL — ABNORMAL HIGH (ref 70–99)

## 2011-03-01 LAB — CBC
HCT: 26.5 % — ABNORMAL LOW (ref 39.0–52.0)
Hemoglobin: 8.7 g/dL — ABNORMAL LOW (ref 13.0–17.0)
MCH: 29.1 pg (ref 26.0–34.0)
MCHC: 32.8 g/dL (ref 30.0–36.0)
MCV: 88.6 fL (ref 78.0–100.0)
Platelets: 82 10*3/uL — ABNORMAL LOW (ref 150–400)
RBC: 2.99 MIL/uL — ABNORMAL LOW (ref 4.22–5.81)
RDW: 16.5 % — ABNORMAL HIGH (ref 11.5–15.5)
WBC: 5.7 10*3/uL (ref 4.0–10.5)

## 2011-03-01 LAB — URINE CULTURE
Colony Count: NO GROWTH
Culture  Setup Time: 201302081818
Culture: NO GROWTH

## 2011-03-01 MED ORDER — POTASSIUM CHLORIDE CRYS ER 20 MEQ PO TBCR
40.0000 meq | EXTENDED_RELEASE_TABLET | Freq: Two times a day (BID) | ORAL | Status: AC
  Administered 2011-03-01 (×2): 40 meq via ORAL
  Filled 2011-03-01 (×2): qty 2

## 2011-03-01 MED ORDER — FUROSEMIDE 10 MG/ML IJ SOLN
160.0000 mg | Freq: Two times a day (BID) | INTRAVENOUS | Status: DC
  Administered 2011-03-01 – 2011-03-02 (×3): 160 mg via INTRAVENOUS
  Filled 2011-03-01 (×4): qty 16

## 2011-03-01 MED ORDER — ROPINIROLE HCL 0.25 MG PO TABS
0.2500 mg | ORAL_TABLET | Freq: Three times a day (TID) | ORAL | Status: DC
  Administered 2011-03-01 – 2011-03-12 (×32): 0.25 mg via ORAL
  Filled 2011-03-01 (×35): qty 1

## 2011-03-01 MED ORDER — DOPAMINE-DEXTROSE 3.2-5 MG/ML-% IV SOLN
2.0000 ug/kg/min | INTRAVENOUS | Status: DC
  Administered 2011-03-01: 2 ug/kg/min via INTRAVENOUS
  Administered 2011-03-02: 1.891 ug/kg/min via INTRAVENOUS
  Filled 2011-03-01 (×2): qty 250

## 2011-03-01 NOTE — Progress Notes (Signed)
[facility] Progress Note  Subjective:  Day 5 s/p AVR/ CABG  Objective:   Vital Signs in the last 24 hours: Temp:  [97.9 F (36.6 C)-98.5 F (36.9 C)] 98 F (36.7 C) (02/09 0739) Pulse Rate:  [77-95] 95  (02/09 0600) Resp:  [12-23] 18  (02/09 0600) BP: (77-135)/(49-75) 125/67 mmHg (02/09 0600) SpO2:  [74 %-100 %] 92 % (02/09 0600) FiO2 (%):  [4 %-100 %] 4 % (02/09 0400) Weight:  [81.784 kg (180 lb 4.8 oz)] 81.784 kg (180 lb 4.8 oz) (02/09 0600)  Intake/Output from previous day: 02/08 0701 - 02/09 0700 In: 1855.3 [P.O.:1050; I.V.:639.3; IV Piggyback:166] Out: 4050 [Urine:4030; Chest Tube:20]  Scheduled:   . amiodarone  200 mg Oral Daily  . bisacodyl  10 mg Oral Daily   Or  . bisacodyl  10 mg Rectal Daily  . calcitRIOL  0.25 mcg Oral Daily  . Chlorhexidine Gluconate Cloth  6 each Topical Daily  . ciprofloxacin  250 mg Oral BID  . darbepoetin (ARANESP) injection - NON-DIALYSIS  150 mcg Subcutaneous Q Fri-1800  . docusate sodium  200 mg Oral Daily  . ferumoxytol  510 mg Intravenous Once  . furosemide  160 mg Intravenous BID  . furosemide  160 mg Intravenous BID  . insulin aspart  0-15 Units Subcutaneous TID WC  . insulin aspart  0-5 Units Subcutaneous QHS  . metoprolol tartrate  12.5 mg Oral BID   Or  . metoprolol tartrate  12.5 mg Per Tube BID  . mupirocin ointment  1 application Nasal BID  . pantoprazole  40 mg Oral Q1200  . phenazopyridine  100 mg Oral TID WC  . potassium chloride  10 mEq Intravenous Q1 Hr x 6  . sodium chloride  10 mL Intravenous Q12H  . DISCONTD: insulin aspart  0-24 Units Subcutaneous Q4H    Physical Exam:   General appearance: alert, cooperative and no distress Neck: no adenopathy, no carotid bruit, no JVD, supple, symmetrical, trachea midline and thyroid not enlarged, symmetric, no tenderness/mass/nodules Lungs: no rales, decreased BS at bases. Heart: S1, S2 normal, no rub and 1/6 sem Abdomen: soft,  non-tender; bowel sounds normal; no masses,  no organomegaly Extremities: trace edema R>L   Rate: 82  Rhythm: Paced, at times AV pacing, at other times A sensing, V pacing; and at other times pacer spike inappropiate in QT interval  Lab Results:    Basename 03/01/11 0422 02/28/11 0300  NA 144 149*  K 3.6 3.3*  CL 108 113*  CO2 28 27  GLUCOSE 98 115*  BUN 52* 60*  CREATININE 2.48* 2.85*   No results found for this basename: TROPONINI:2,CK,MB:2 in the last 72 hours Hepatic Function Panel No results found for this basename: PROT,ALBUMIN,AST,ALT,ALKPHOS,BILITOT,BILIDIR,IBILI in the last 72 hours No results found for this basename: INR in the last 72 hours  Lipid Panel     Component Value Date/Time   CHOL  Value: 95        ATP III CLASSIFICATION:  <200     mg/dL   Desirable  161-096  mg/dL   Borderline High  >=045    mg/dL   High 4/0/9811 9147   TRIG 52 06/29/2006 0345   HDL 35* 06/29/2006 0345   CHOLHDL 2.7 06/29/2006 0345   VLDL 10 06/29/2006 0345   LDLCALC  Value: 50        Total Cholesterol/HDL:CHD Risk Coronary Heart Disease Risk Table  Men   Women  1/2 Average Risk   3.4   3.3 06/29/2006 0345     Imaging:  Dg Chest Port 1 View  03/01/2011  *RADIOLOGY REPORT*  Clinical Data: Status post CABG  PORTABLE CHEST - 1 VIEW  Comparison: 02/28/2011  Findings:  There is a right chest wall pacer device with lead in the right atrial appendage and right ventricle.  There is a left subclavian catheter with tip in the SVC.  Interval removal of left chest tube without visible pneumothorax.  Heart size is mildly enlarged.  There is a small to moderate left pleural effusion which is posterior layering.  Stable from previous exam.  Atelectasis is present within the right base.  IMPRESSION:  1.  Interval removal of left chest tube without pneumothorax. 2.  Persistent left effusion.  Original Report Authenticated By: Rosealee Albee, M.D.   Dg Chest Port 1 View  02/28/2011  *RADIOLOGY  REPORT*  Clinical Data: Aortic valve replacement  PORTABLE CHEST - 1 VIEW  Comparison: Yesterday  Findings: Stable left chest tube and left subclavian central venous catheter.  Dual lead right subclavian pacemaker device is stable. No pneumothorax.   Left basilar opacity, a combination of pleural effusion and volume loss is stable.  Right basilar atelectasis improved.  IMPRESSION: Improved right base atelectasis.  Stable left effusion and basilar opacity.  Original Report Authenticated By: Donavan Burnet, M.D.      Assessment/Plan:   Active Problems:  Chronic renal failure, pt has AVF LUE  SLE (systemic lupus erythematosus)  S/P CABG x 2: (LIMA-LAD  SVG - RCA)  Severe AS, tissue AVR this admission  PAF (paroxysmal atrial fibrillation), on Amiodarone prior to admission  Pacemaker, MDT MDT implanted 08/06  Chronic anticoagulation, on Coumadin prior to admission  Anemia, post op, Hgb 7.9  Respiratory failure, hypoxia post op, on bipap currently   Renal function improving. Day 5 tissue valve AVR and CABG.  May need to interrogate pacer. May need to call rep ([employer]).   Lennette Bihari, MD, [facility] 03/01/2011, 9:48 AM

## 2011-03-01 NOTE — Progress Notes (Signed)
[employer] rep here to assess, see note filed in chart(paper note), Dr. Maren Beach aware

## 2011-03-01 NOTE — Progress Notes (Signed)
Subjective: Sitting in chair, wife, son, and friends at bedside.  RN reports he's weak when trying to walk  Objective: Vital signs in last 24 hours: Blood pressure 124/65, pulse 90, temperature 98 F (36.7 C), temperature source Oral, resp. rate 20, height 5\' 7"  (1.702 m), weight 81.784 kg (180 lb 4.8 oz), SpO2 94.00%.   Intake/Output from previous day: 02/08 0701 - 02/09 0700 In: 1855.3 [P.O.:1050; I.V.:639.3; IV Piggyback:166] Out: 4050 [Urine:4030; Chest Tube:20] Intake/Output this shift: Total I/O In: 285.8 [P.O.:240; I.V.:45.8] Out: 100 [Urine:100] Weight change: 1.383 kg (3 lb 0.8 oz) Date            WEIGHT 23 Feb 2011 76.6 kg 6 Feb  83.5 kg 8 Feb  80.4 kg 9 Feb  81.8 kg  PHYSICAL EXAM General--awake, alert Chest--post op sternotomy, clear, pacer R subclavian Heart--no rub Abd--nontender Extr--1+ edema R LE (saph vein harvest site), no edema on L, AVF patent L forearm  Lab Results:   Lab 03/01/11 0422 02/28/11 0300 02/27/11 0408  NA 144 149* 145  K 3.6 3.3* 4.1  CL 108 113* 111  CO2 28 27 25   BUN 52* 60* 65*  CREATININE 2.48* 2.85* 3.35*  EGFR -- -- --  GLUCOSE 98 -- --  CALCIUM 9.7 9.3 9.3  PHOS -- -- --      Basename 03/01/11 0422 02/28/11 0300  WBC 5.7 6.4  HGB 8.7* 8.5*  HCT 26.5* 25.7*  PLT 82* 74*     Basename 02/26/11 2100  IRON 21*  TIBC 233  FERRITIN 97     No results found for this basename: PTH in the last 72 hours   Scheduled:   . amiodarone  200 mg Oral Daily  . bisacodyl  10 mg Oral Daily   Or  . bisacodyl  10 mg Rectal Daily  . calcitRIOL  0.25 mcg Oral Daily  . Chlorhexidine Gluconate Cloth  6 each Topical Daily  . ciprofloxacin  250 mg Oral BID  . darbepoetin (ARANESP) injection - NON-DIALYSIS  150 mcg Subcutaneous Q Fri-1800  . docusate sodium  200 mg Oral Daily  . ferumoxytol  510 mg Intravenous Once  . furosemide  160 mg Intravenous BID  . furosemide  160 mg Intravenous BID  . furosemide  160 mg Intravenous BID    . insulin aspart  0-15 Units Subcutaneous TID WC  . insulin aspart  0-5 Units Subcutaneous QHS  . metoprolol tartrate  12.5 mg Oral BID   Or  . metoprolol tartrate  12.5 mg Per Tube BID  . mupirocin ointment  1 application Nasal BID  . pantoprazole  40 mg Oral Q1200  . phenazopyridine  100 mg Oral TID WC  . potassium chloride  10 mEq Intravenous Q1 Hr x 6  . potassium chloride  40 mEq Oral BID  . sodium chloride  10 mL Intravenous Q12H  . DISCONTD: insulin aspart  0-24 Units Subcutaneous Q4H   Continuous:   . sodium chloride 250 mL (02/27/11 1600)  . DOPamine 2 mcg/kg/min (02/27/11 1900)  . DOPamine 2 mcg/kg/min (03/01/11 0915)  . DISCONTD: amiodarone (NEXTERONE PREMIX) 360 mg/200 mL dextrose Stopped (02/28/11 2045)    Assessment/Plan: 1. Chronic kidney disease-stage IV with worsening following AVR/CABG--now renal fct improving (better than baseline PTA). Chest x-ray bibasilar atel and L pl eff. On furosemide 160 BID today and tomorrow. No IVs no needlesticks left forearm (site of AV fistula) . po KCl, check Mg in AM 2. S./P CABG/AVR-per primary service  3. Anemia- iron studies indicate Fe deficiency. IV Feraheme 510 mg again on 10 Feb ,aranesp 150/wk start yesterday  4. Secondary hyperparathyroidism- Continue calcitriol 0.25 mcg/D.  5. Prior history atrial fibrillation-currently paced rhythm. Had been on Coumadin and amiodarone prior to admission  Pacemaker present right subclavian 6.  ? UTI--on cipro    LOS: 5 days   Syaire Saber F 03/01/2011,11:21 AM   .labalb

## 2011-03-01 NOTE — Progress Notes (Signed)
Speech Language/Pathology Speech Language Pathology Swallow Treatment Patient Details Name: Nathaniel Steele MRN: [id number] DOB: Apr 29, 1926 Today's Date: 03/01/2011  SLP Assessment/Plan/Recommendation Assessment Clinical Impression Statement: Patient seen by ST for diet tolerance of recommended diet of dsyphagia 3 ( mechanical soft)  and thin liquids following dysphagia treatment on 02-28-11. Per nursing patient is tolerating current diet w/o difficulty. Initial BSE with recommendations for Dysphagia 3 for energy conservation but patient with order for regular consistency.   Patient in recliner with nasal cannula in place with spouse and son present when SLP entered room.  Patient demonstrated ability to maintain O2  saturation levels above 90% during  and s/p swallow of thin water by cup/straw.  Patient verbalized current aspiration precautions correctly w/o cues required. No outward s/s of aspiration noted with thin liquids however prior to SLP leaving room patient observed with moderate throat clears.  Continued ST warranted in acute setting for diet tolerance.  Swallowing Goals  SLP Swallowing Goals Swallow Study Goal #1 - Progress: Progressing toward goal Swallow Study Goal #2 - Progress: Progressing toward goal  General Patient observed directly with PO's: Yes Type of PO's observed: Thin liquids Feeding: Able to feed self Liquids provided via: Straw;Cup Temperature Spikes Noted: No Respiratory Status: Supplemental O2 delivered via (comment) Behavior/Cognition: Alert;Cooperative Oral Cavity - Dentition: Dentures, top;Dentures, bottom Patient Positioning: Upright in chair  Oral Cavity - Oral Hygiene Does patient have any of the following "at risk" factors?: Oxygen therapy - cannula, mask, simple oxygen devices Patient is HIGH RISK - Oral Care Protocol followed (see row info): Yes   Assessment of Diet Tolerance Signs&Symptoms of aspiration at the bedside: Throat  clearing Supervision/Assistance required: No Recommendations: Continue current diet  Moreen Fowler M.S., CCC-SLP [phone] [facility] 03/01/2011, 5:49 PM

## 2011-03-01 NOTE — Progress Notes (Signed)
5 Days Post-Op Procedure(s) (LRB): AORTIC VALVE REPLACEMENT (AVR) (N/A) CORONARY ARTERY BYPASS GRAFTING (CABG) (N/A) Subjective: Walked in hall  Objective: Vital signs in last 24 hours: Temp:  [97.9 F (36.6 C)-98.5 F (36.9 C)] 98 F (36.7 C) (02/09 1200) Pulse Rate:  [73-95] 83  (02/09 1500) Cardiac Rhythm:  [-] Normal sinus rhythm;Ventricular paced (02/09 1500) Resp:  [13-27] 18  (02/09 1500) BP: (77-138)/(49-78) 117/60 mmHg (02/09 1500) SpO2:  [74 %-100 %] 93 % (02/09 1500) FiO2 (%):  [4 %-100 %] 4 % (02/09 0400) Weight:  [180 lb 4.8 oz (81.784 kg)] 180 lb 4.8 oz (81.784 kg) (02/09 0600)  Hemodynamic parameters for last 24 hours:    Intake/Output from previous day: 02/08 0701 - 02/09 0700 In: 1855.3 [P.O.:1050; I.V.:639.3; IV Piggyback:166] Out: 4050 [Urine:4030; Chest Tube:20] Intake/Output this shift: Total I/O In: 839 [P.O.:600; I.V.:189; IV Piggyback:50] Out: 750 [Urine:750]  Lungs clear Perm pacemaker not sensing well  Lab Results:  Basename 03/01/11 0422 02/28/11 0300  WBC 5.7 6.4  HGB 8.7* 8.5*  HCT 26.5* 25.7*  PLT 82* 74*   BMET:  Basename 03/01/11 0422 02/28/11 0300  NA 144 149*  K 3.6 3.3*  CL 108 113*  CO2 28 27  GLUCOSE 98 115*  BUN 52* 60*  CREATININE 2.48* 2.85*  CALCIUM 9.7 9.3    PT/INR: No results found for this basename: LABPROT,INR in the last 72 hours ABG    Component Value Date/Time   PHART 7.405 02/27/2011 0351   HCO3 23.3 02/27/2011 0351   TCO2 24 02/27/2011 0351   ACIDBASEDEF 1.0 02/27/2011 0351   O2SAT 94.0 02/27/2011 0351   CBG (last 3)   Basename 03/01/11 1138 03/01/11 0735 02/28/11 1919  GLUCAP 114* 92 134*    Assessment/Plan: S/P Procedure(s) (LRB): AORTIC VALVE REPLACEMENT (AVR) (N/A) CORONARY ARTERY BYPASS GRAFTING (CABG) (N/A) Cont current care   LOS: 5 days    VAN TRIGT III,Mylee Falin 03/01/2011

## 2011-03-02 ENCOUNTER — Inpatient Hospital Stay (HOSPITAL_COMMUNITY): Payer: Medicare Other

## 2011-03-02 LAB — GLUCOSE, CAPILLARY
Glucose-Capillary: 93 mg/dL (ref 70–99)
Glucose-Capillary: 124 mg/dL — ABNORMAL HIGH (ref 70–99)
Glucose-Capillary: 106 mg/dL — ABNORMAL HIGH (ref 70–99)
Glucose-Capillary: 122 mg/dL — ABNORMAL HIGH (ref 70–99)

## 2011-03-02 LAB — CBC
HCT: 27 % — ABNORMAL LOW (ref 39.0–52.0)
Hemoglobin: 8.7 g/dL — ABNORMAL LOW (ref 13.0–17.0)
MCH: 28.7 pg (ref 26.0–34.0)
MCHC: 32.2 g/dL (ref 30.0–36.0)
MCV: 89.1 fL (ref 78.0–100.0)
Platelets: 85 10*3/uL — ABNORMAL LOW (ref 150–400)
RBC: 3.03 MIL/uL — ABNORMAL LOW (ref 4.22–5.81)
RDW: 16.3 % — ABNORMAL HIGH (ref 11.5–15.5)
WBC: 5 10*3/uL (ref 4.0–10.5)

## 2011-03-02 LAB — BASIC METABOLIC PANEL
BUN: 47 mg/dL — ABNORMAL HIGH (ref 6–23)
CO2: 28 mEq/L (ref 19–32)
Calcium: 9.8 mg/dL (ref 8.4–10.5)
Chloride: 104 mEq/L (ref 96–112)
Creatinine, Ser: 2.35 mg/dL — ABNORMAL HIGH (ref 0.50–1.35)
GFR calc Af Amer: 28 mL/min — ABNORMAL LOW (ref >90)
GFR calc non Af Amer: 24 mL/min — ABNORMAL LOW (ref >90)
Glucose, Bld: 99 mg/dL (ref 70–99)
Potassium: 3.6 mEq/L (ref 3.5–5.1)
Sodium: 140 mEq/L (ref 135–145)

## 2011-03-02 MED ORDER — WARFARIN SODIUM 2.5 MG PO TABS
2.5000 mg | ORAL_TABLET | Freq: Every day | ORAL | Status: DC
  Administered 2011-03-02 – 2011-03-06 (×5): 2.5 mg via ORAL
  Filled 2011-03-02 (×6): qty 1

## 2011-03-02 MED ORDER — FUROSEMIDE 80 MG PO TABS
80.0000 mg | ORAL_TABLET | Freq: Every day | ORAL | Status: DC
  Filled 2011-03-02: qty 1

## 2011-03-02 NOTE — Progress Notes (Signed)
Subjective: Awake, sitting in chair, has frequent urination (? UTI + furosemide 160 BID)--he was oi=n furosemide 80/d PTA.  Weak when standing( trying to get PA/Lat CXR) today--portable was done  Objective: Vital signs in last 24 hours: Blood pressure 179/40, pulse 89, temperature 97.9 F (36.6 C), temperature source Oral, resp. rate 21, height 5\' 7"  (1.702 m), weight 77.2 kg (170 lb 3.1 oz), SpO2 95.00%.   Intake/Output from previous day: 02/09 0701 - 02/10 0700 In: 1795.4 [P.O.:1140; I.V.:555.4; IV Piggyback:100] Out: 4525 [Urine:4525] Intake/Output this shift:   Weight change: -4.584 kg (-10 lb 1.7 oz) Date             WEIGHT  23 Feb 2011    76.6 kg  6 Feb             83.5 kg  8 Feb             80.4 kg  9 Feb             81.8 kg 10 Feb           77.2 kg    PHYSICAL EXAM General--awake. tired Chest--rhonchi, sternotomy clean, pacer R subclav Heart--no rub Abd--nontender Extr--1+ edema R LE (saph vein harvest site), no edema on L.  AVF patent L forearm  Lab Results:   Lab 03/02/11 0410 03/01/11 0422 02/28/11 0300  NA 140 144 149*  K 3.6 3.6 3.3*  CL 104 108 113*  CO2 28 28 27   BUN 47* 52* 60*  CREATININE 2.35* 2.48* 2.85*  EGFR -- -- --  GLUCOSE 99 -- --  CALCIUM 9.8 9.7 9.3  PHOS -- -- --      Basename 03/02/11 0410 03/01/11 0422  WBC 5.0 5.7  HGB 8.7* 8.7*  HCT 27.0* 26.5*  PLT 85* 82*    No results found for this basename: IRON,TIBC,FERRITIN in the last 72 hours   No results found for this basename: PTH in the last 72 hours      Assessment/Plan: 1. Chronic kidney disease-stage IV with worsening following AVR/CABG--now renal fct improving (better than baseline63f 2.99  PTA). Chest x-ray bibasilar atel and L pl eff. On furosemide 160 BID yesterday. No IVs no needlesticks left forearm (site of AV fistula) . po KCl,Mg 2.3 today.    Check phos in AM.  D/C iv furosemide.  Decrease furosemide to 80/d po tomorrow 2. S./P CABG/AVR-per primary service  3.  Anemia- iron studies indicate Fe deficiency. IV Feraheme 510 mg given x 2,  aranesp 150/wk start 8 Feb  4. Secondary hyperparathyroidism- Continue calcitriol 0.25 mcg/D.  5. Prior history atrial fibrillation-currently paced rhythm. Had been on Coumadin and amiodarone prior to admission.  Now on amio, metoprolol, coumadin  Pacemaker present right subclavian  6. ? UTI--on cipro     LOS: 6 days   Nathaniel Steele F 03/02/2011,9:04 AM   .labalb

## 2011-03-02 NOTE — Progress Notes (Signed)
[facility] Progress Note  Subjective:  Day 6 S/P AVR/CABG  Objective:   Vital Signs in the last 24 hours: Temp:  [97.9 F (36.6 C)-98.8 F (37.1 C)] 97.9 F (36.6 C) (02/10 0732) Pulse Rate:  [73-89] 89  (02/10 0700) Resp:  [15-27] 21  (02/10 0700) BP: (112-179)/(40-89) 179/40 mmHg (02/10 0700) SpO2:  [91 %-100 %] 95 % (02/10 0700) Weight:  [77.2 kg (170 lb 3.1 oz)] 77.2 kg (170 lb 3.1 oz) (02/10 0700)  Intake/Output from previous day: 02/09 0701 - 02/10 0700 In: 1795.4 [P.O.:1140; I.V.:555.4; IV Piggyback:100] Out: 4525 [Urine:4525]  Scheduled:   . amiodarone  200 mg Oral Daily  . bisacodyl  10 mg Oral Daily   Or  . bisacodyl  10 mg Rectal Daily  . calcitRIOL  0.25 mcg Oral Daily  . ciprofloxacin  250 mg Oral BID  . darbepoetin (ARANESP) injection - NON-DIALYSIS  150 mcg Subcutaneous Q Fri-1800  . docusate sodium  200 mg Oral Daily  . ferumoxytol  510 mg Intravenous Once  . furosemide  80 mg Oral Daily  . insulin aspart  0-15 Units Subcutaneous TID WC  . insulin aspart  0-5 Units Subcutaneous QHS  . metoprolol tartrate  12.5 mg Oral BID   Or  . metoprolol tartrate  12.5 mg Per Tube BID  . pantoprazole  40 mg Oral Q1200  . phenazopyridine  100 mg Oral TID WC  . potassium chloride  40 mEq Oral BID  . rOPINIRole  0.25 mg Oral TID  . sodium chloride  10 mL Intravenous Q12H  . warfarin  2.5 mg Oral q1800  . DISCONTD: furosemide  160 mg Intravenous BID    Physical Exam:   General appearance: alert, cooperative and no distress Neck: no adenopathy, no carotid bruit, no JVD and supple, symmetrical, trachea midline Lungs: diminished breath sounds base - left Heart: irregularly irregular rhythm Abdomen: soft, non-tender; bowel sounds normal; no masses,  no organomegaly Extremities: trivial edema   Rate: 80 - 90  Rhythm: A F with occasional V pacing. Pacemaker interrogated yesterday due to frequent pseudopacing.  Reprogrammed atrial  sensitivity to .15 mV due to occasionakl atrial undersensing during AT/AF.  Lab Results:    Basename 03/02/11 0410 03/01/11 0422  NA 140 144  K 3.6 3.6  CL 104 108  CO2 28 28  GLUCOSE 99 98  BUN 47* 52*  CREATININE 2.35* 2.48*   No results found for this basename: TROPONINI:2,CK,MB:2 in the last 72 hours Hepatic Function Panel No results found for this basename: PROT,ALBUMIN,AST,ALT,ALKPHOS,BILITOT,BILIDIR,IBILI in the last 72 hours No results found for this basename: INR in the last 72 hours  Lipid Panel     Component Value Date/Time   CHOL  Value: 95        ATP III CLASSIFICATION:  <200     mg/dL   Desirable  254-270  mg/dL   Borderline High  >=623    mg/dL   High 07/25/2829 5176   TRIG 52 06/29/2006 0345   HDL 35* 06/29/2006 0345   CHOLHDL 2.7 06/29/2006 0345   VLDL 10 06/29/2006 0345   LDLCALC  Value: 50        Total Cholesterol/HDL:CHD Risk Coronary Heart Disease Risk Table                     Men   Women  1/2 Average Risk   3.4   3.3 06/29/2006 0345     Imaging:  Dg Chest Port 1 View  03/02/2011  *RADIOLOGY REPORT*  Clinical Data: Status post CABG, AVR  PORTABLE CHEST - 1 VIEW  Comparison: 03/01/2011  Findings: Stable layering left pleural effusion.  Left lower lobe opacity, likely atelectasis.  Stable left subclavian venous catheter. Stable right subclavian pacemaker.  Mild cardiomegaly. Postsurgical changes related to prior CABG. Aortic valve prosthesis.  No pneumothorax.  IMPRESSION: Stable layering left pleural effusion.  No pneumothorax.  Original Report Authenticated By: Charline Bills, M.D.   Dg Chest Port 1 View  03/01/2011  *RADIOLOGY REPORT*  Clinical Data: Status post CABG  PORTABLE CHEST - 1 VIEW  Comparison: 02/28/2011  Findings:  There is a right chest wall pacer device with lead in the right atrial appendage and right ventricle.  There is a left subclavian catheter with tip in the SVC.  Interval removal of left chest tube without visible pneumothorax.  Heart size is  mildly enlarged.  There is a small to moderate left pleural effusion which is posterior layering.  Stable from previous exam.  Atelectasis is present within the right base.  IMPRESSION:  1.  Interval removal of left chest tube without pneumothorax. 2.  Persistent left effusion.  Original Report Authenticated By: Rosealee Albee, M.D.      Assessment/Plan:   Active Problems:  Chronic renal failure, pt has AVF LUE  SLE (systemic lupus erythematosus)  S/P CABG x 2: (LIMA-LAD  SVG - RCA)  Severe AS, tissue AVR this admission  PAF (paroxysmal atrial fibrillation), on Amiodarone prior to admission  Pacemaker, MDT MDT implanted 08/06  Chronic anticoagulation, on Coumadin prior to admission  Anemia, post op, Hgb 7.9  Respiratory failure, hypoxia post op, on bipap currently Pleural effusion  Stable hemodynamics day 5 s/p AVR/CABG.  Renal fxn continues to improve. Pacemaker reprogrammed. Left pleural  effusion   Nathaniel Bihari, MD, [facility] 03/02/2011, 10:23 AM

## 2011-03-02 NOTE — Progress Notes (Signed)
Postop day 5 status post aVR CABG x2  The patient's general condition is improved. He is able to walk in the hallway. He has chronic renal failure from lupus renal disease. He has chronic thrombocytopenia with platelet count of 80,000. He has a history of intermittent atrial  fibrillation and his low dose Coumadin has been restarted.  The patient had significant cardiomyopathy and has a mild left pleural effusion to be evaluated with a PA and lateral chest x-ray tomorrow.  His neurologic unction is good with the baseline benign tremor. The surgical incisions are clean and dry.  His creatinine is 2.3 and he is being diuresed under the direction of nephrology.  The plan is further ICU care before he is able to transfer to step down. His pulmonary status is slowly improving but he still has periods of desaturation with activity.

## 2011-03-03 ENCOUNTER — Inpatient Hospital Stay (HOSPITAL_COMMUNITY): Payer: Medicare Other

## 2011-03-03 LAB — CBC
HCT: 25.1 % — ABNORMAL LOW (ref 39.0–52.0)
Hemoglobin: 8.5 g/dL — ABNORMAL LOW (ref 13.0–17.0)
MCH: 30.2 pg (ref 26.0–34.0)
MCHC: 33.9 g/dL (ref 30.0–36.0)
MCV: 89.3 fL (ref 78.0–100.0)
Platelets: 88 10*3/uL — ABNORMAL LOW (ref 150–400)
RBC: 2.81 MIL/uL — ABNORMAL LOW (ref 4.22–5.81)
RDW: 16.7 % — ABNORMAL HIGH (ref 11.5–15.5)
WBC: 4.9 10*3/uL (ref 4.0–10.5)

## 2011-03-03 LAB — BASIC METABOLIC PANEL
BUN: 44 mg/dL — ABNORMAL HIGH (ref 6–23)
CO2: 28 mEq/L (ref 19–32)
Calcium: 9.6 mg/dL (ref 8.4–10.5)
Chloride: 104 mEq/L (ref 96–112)
Creatinine, Ser: 2.24 mg/dL — ABNORMAL HIGH (ref 0.50–1.35)
GFR calc Af Amer: 30 mL/min — ABNORMAL LOW (ref >90)
GFR calc non Af Amer: 26 mL/min — ABNORMAL LOW (ref >90)
Glucose, Bld: 99 mg/dL (ref 70–99)
Potassium: 3.2 mEq/L — ABNORMAL LOW (ref 3.5–5.1)
Sodium: 139 mEq/L (ref 135–145)

## 2011-03-03 LAB — GLUCOSE, CAPILLARY
Glucose-Capillary: 94 mg/dL (ref 70–99)
Glucose-Capillary: 116 mg/dL — ABNORMAL HIGH (ref 70–99)
Glucose-Capillary: 121 mg/dL — ABNORMAL HIGH (ref 70–99)
Glucose-Capillary: 104 mg/dL — ABNORMAL HIGH (ref 70–99)

## 2011-03-03 LAB — PROTIME-INR
INR: 1.2 (ref 0.00–1.49)
Prothrombin Time: 15.5 seconds — ABNORMAL HIGH (ref 11.6–15.2)

## 2011-03-03 LAB — MAGNESIUM: Magnesium: 2.3 mg/dL (ref 1.5–2.5)

## 2011-03-03 MED ORDER — FUROSEMIDE 80 MG PO TABS
80.0000 mg | ORAL_TABLET | Freq: Two times a day (BID) | ORAL | Status: DC
  Filled 2011-03-03 (×3): qty 1

## 2011-03-03 MED ORDER — FUROSEMIDE 80 MG PO TABS: 80.0000 mg | ORAL_TABLET | Freq: Every day | ORAL | Status: DC

## 2011-03-03 MED ORDER — DIPHENHYDRAMINE HCL 25 MG PO CAPS
25.0000 mg | ORAL_CAPSULE | Freq: Every evening | ORAL | Status: DC | PRN
  Administered 2011-03-03 – 2011-03-05 (×2): 25 mg via ORAL
  Filled 2011-03-03 (×3): qty 1

## 2011-03-03 MED ORDER — POTASSIUM CHLORIDE CRYS ER 20 MEQ PO TBCR
40.0000 meq | EXTENDED_RELEASE_TABLET | Freq: Two times a day (BID) | ORAL | Status: AC
  Administered 2011-03-03 (×2): 40 meq via ORAL
  Filled 2011-03-03 (×2): qty 2

## 2011-03-03 MED ORDER — FUROSEMIDE 80 MG PO TABS
80.0000 mg | ORAL_TABLET | Freq: Every day | ORAL | Status: DC
  Administered 2011-03-03 – 2011-03-08 (×6): 80 mg via ORAL
  Filled 2011-03-03 (×6): qty 1

## 2011-03-03 NOTE — Progress Notes (Addendum)
7 Days Post-Op Procedure(s) (LRB): AORTIC VALVE REPLACEMENT (AVR) (N/A) CORONARY ARTERY BYPASS GRAFTING (CABG) (N/A)  Subjective: Patient not sleeping well.  Objective: Vital signs in last 24 hours: Patient Vitals for the past 24 hrs:  BP Temp Temp src Pulse Resp SpO2 Weight  03/03/11 0700 99/53 mmHg - - 82  20  98 % -  03/03/11 0600 133/74 mmHg - - 80  20  94 % 169 lb 15.6 oz (77.1 kg)  03/03/11 0500 106/60 mmHg - - 72  18  96 % -  03/03/11 0400 118/64 mmHg - - 72  17  95 % -  03/03/11 0353 - 98.6 F (37 C) Axillary - - - -  03/03/11 0300 139/70 mmHg - - 76  19  95 % -  03/03/11 0200 129/63 mmHg - - 72  17  97 % -  03/03/11 0100 116/59 mmHg - - 71  18  98 % -  03/03/11 0000 125/61 mmHg - - 71  18  98 % -  03/02/11 2331 - 98.2 F (36.8 C) Axillary - - - -  03/02/11 2300 119/75 mmHg - - 77  22  97 % -  03/02/11 2200 134/80 mmHg - - 78  20  95 % -  03/02/11 2100 127/77 mmHg - - 80  19  97 % -  03/02/11 2000 152/84 mmHg - - 85  21  97 % -  03/02/11 1944 - 98 F (36.7 C) Axillary - - - -  03/02/11 1900 143/88 mmHg - - 82  19  96 % -  03/02/11 1800 105/52 mmHg - - 66  18  81 % -   Pre op weight  76.7 kg Current Weight  03/03/11 169 lb 15.6 oz (77.1 kg)      Intake/Output from previous day: 02/10 0701 - 02/11 0700 In: 1543.8 [P.O.:1040; I.V.:503.8] Out: 2000 [Urine:2000]   Physical Exam:  Cardiovascular: RRR, no murmurs, gallops, or rubs. Pulmonary: Decreased at left base; no rales, wheezes, or rhonchi. Abdomen: Soft, non tender, bowel sounds present. Extremities: Bilateral lower extremity edema. Wounds: Clean and dry.  No erythema or signs of infection.  Lab Results: CBC: Basename 03/03/11 0420 03/02/11 0410  WBC 4.9 5.0  HGB 8.5* 8.7*  HCT 25.1* 27.0*  PLT 88* 85*   BMET:  Basename 03/03/11 0420 03/02/11 0410  NA 139 140  K 3.2* 3.6  CL 104 104  CO2 28 28  GLUCOSE 99 99  BUN 44* 47*  CREATININE 2.24* 2.35*  CALCIUM 9.6 9.8    PT/INR:  Basename  03/03/11 0420  LABPROT 15.5*  INR 1.20   ABG:  INR: Will add last result for INR, ABG once components are confirmed Will add last 4 CBG results once components are confirmed  Assessment/Plan:  1. CV - History of PAF. Occ VPaced this am.Continue Amiodarone 200 daily, Lopressor 12.5 bid (with parameters), and Coumadin.Monitor HR and SBP as HR in low 60's this am and SBP has been in the 90's to low 100's. 2.  Pulmonary - CXR this am shows no pneumothorax, a left pleural effusion, mild cardiomegaly.Wean O2 as toelrates. Doubt needs thoracentesis at this time. 3. Volume Overload - Diuerse per nephrology. 4.  Acute blood loss anemia - H/H 8.5/25.1.Continue Aranesp. 5.CKD (stage IV)-Creatinine slightly decreased from 2.35 to 2.24.Per nephroIogy. 6.Benadryl PRN sleep. 7.Thrombocytopenia-patient with a history. Platelets remain 88,000. 8.Probable UTI-on Cipro.  ZIMMERMAN,DONIELLE MPA-C 03/03/2011 patient examined and medical record reviewed,agree with above note.  Wean  renal dopamine,reduce lasix dose,remove EPW Keep in ICU VAN TRIGT III,PETER 03/03/2011

## 2011-03-03 NOTE — Progress Notes (Addendum)
[facility]  Subjective: Chest sore still.  Breathing better.  Objective: Vital signs in last 24 hours: Temp:  [97.5 F (36.4 C)-98.6 F (37 C)] 97.5 F (36.4 C) (02/11 1155) Pulse Rate:  [66-89] 80  (02/11 1100) Resp:  [17-23] 20  (02/11 0800) BP: (90-152)/(46-88) 100/56 mmHg (02/11 1100) SpO2:  [81 %-100 %] 99 % (02/11 1100) Weight:  [77.1 kg (169 lb 15.6 oz)] 77.1 kg (169 lb 15.6 oz) (02/11 0600) Last BM Date: 03/02/11  Intake/Output from previous day: 02/10 0701 - 02/11 0700 In: 1543.8 [P.O.:1040; I.V.:503.8] Out: 2000 [Urine:2000] Intake/Output this shift: Total I/O In: 365.8 [P.O.:300; I.V.:65.8] Out: 200 [Urine:200]  Medications Current Facility-Administered Medications  Medication Dose Route Frequency Provider Last Rate Last Dose  . 0.9 %  sodium chloride infusion  250 mL Intravenous Continuous Kathlee Nations Trigt III, MD 20 mL/hr at 03/02/11 1015 250 mL at 03/02/11 1015  . amiodarone (PACERONE) tablet 200 mg  200 mg Oral Daily Mihai Croitoru, MD   200 mg at 03/03/11 1031  . bisacodyl (DULCOLAX) EC tablet 10 mg  10 mg Oral Daily Kathlee Nations Trigt III, MD   10 mg at 03/02/11 1610   Or  . bisacodyl (DULCOLAX) suppository 10 mg  10 mg Rectal Daily Kathlee Nations Trigt III, MD   10 mg at 02/25/11 1559  . calcitRIOL (ROCALTROL) capsule 0.25 mcg  0.25 mcg Oral Daily Zada Girt, MD   0.25 mcg at 03/03/11 1030  . ciprofloxacin (CIPRO) tablet 250 mg  250 mg Oral BID Kathlee Nations Trigt III, MD   250 mg at 03/03/11 0914  . darbepoetin (ARANESP) injection 150 mcg  150 mcg Subcutaneous Q Fri-1800 Zada Girt, MD   150 mcg at 02/28/11 1830  . diphenhydrAMINE (BENADRYL) capsule 25 mg  25 mg Oral QHS PRN Ardelle Balls, PA      . docusate sodium (COLACE) capsule 200 mg  200 mg Oral Daily Kathlee Nations Trigt III, MD   200 mg at 03/03/11 1030  . furosemide (LASIX) tablet 80 mg  80 mg Oral Daily Kathlee Nations Trigt III, MD   80 mg at 03/03/11 1031  .  HYDROcodone-acetaminophen (NORCO) 5-325 MG per tablet 1-2 tablet  1-2 tablet Oral Q4H PRN Kerin Perna III, MD   1 tablet at 02/28/11 2334  . insulin aspart (novoLOG) injection 0-15 Units  0-15 Units Subcutaneous TID WC Kerin Perna III, MD   2 Units at 03/02/11 1312  . insulin aspart (novoLOG) injection 0-5 Units  0-5 Units Subcutaneous QHS Kathlee Nations Trigt III, MD      . metoprolol (LOPRESSOR) injection 2.5-5 mg  2.5-5 mg Intravenous Q2H PRN Kathlee Nations Trigt III, MD      . metoprolol tartrate (LOPRESSOR) tablet 12.5 mg  12.5 mg Oral BID Kathlee Nations Trigt III, MD   12.5 mg at 03/02/11 2151   Or  . metoprolol tartrate (LOPRESSOR) 25 mg/10 mL oral suspension 12.5 mg  12.5 mg Per Tube BID Kathlee Nations Trigt III, MD      . pantoprazole (PROTONIX) EC tablet 40 mg  40 mg Oral Q1200 Kerin Perna III, MD   40 mg at 03/02/11 1312  . potassium chloride SA (K-DUR,KLOR-CON) CR tablet 40 mEq  40 mEq Oral BID Lauris Poag, MD   40 mEq at 03/03/11 1124  . rOPINIRole (REQUIP) tablet 0.25 mg  0.25 mg Oral TID Kerin Perna III, MD   0.25 mg  at 03/03/11 1031  . sodium chloride 0.9 % injection 10 mL  10 mL Intravenous Q12H Kathlee Nations Trigt III, MD   10 mL at 03/03/11 1031  . warfarin (COUMADIN) tablet 2.5 mg  2.5 mg Oral q1800 Kathlee Nations Trigt III, MD   2.5 mg at 03/02/11 1802  . DISCONTD: DOPamine (INTROPIN) 800 mg in dextrose 5 % 250 mL infusion  2 mcg/kg/min Intravenous Continuous Kathlee Nations Trigt III, MD 2.9 mL/hr at 03/02/11 1017 1.891 mcg/kg/min at 03/02/11 1017  . DISCONTD: furosemide (LASIX) tablet 80 mg  80 mg Oral Daily Zada Girt, MD      . DISCONTD: furosemide (LASIX) tablet 80 mg  80 mg Oral BID Lauris Poag, MD      . DISCONTD: furosemide (LASIX) tablet 80 mg  80 mg Oral Daily Kathlee Nations Trigt III, MD      . DISCONTD: phenazopyridine (PYRIDIUM) tablet 100 mg  100 mg Oral TID WC Kerin Perna III, MD   100 mg at 03/03/11 0914    PE: General appearance: alert, cooperative and no  distress Lungs: clear to auscultation bilaterally Heart: regular rate and rhythm, 1/6 SYS MM Abdomen: BS positive in all Quads Extremities: 1+ LEE in the right leg Pulses: Radials 2+ and symmetric  Lab Results:   Basename 03/03/11 0420 03/02/11 0410 03/01/11 0422  WBC 4.9 5.0 5.7  HGB 8.5* 8.7* 8.7*  HCT 25.1* 27.0* 26.5*  PLT 88* 85* 82*   BMET  Basename 03/03/11 0420 03/02/11 0410 03/01/11 0422  NA 139 140 144  K 3.2* 3.6 3.6  CL 104 104 108  CO2 28 28 28   GLUCOSE 99 99 98  BUN 44* 47* 52*  CREATININE 2.24* 2.35* 2.48*  CALCIUM 9.6 9.8 9.7   PT/INR  Basename 03/03/11 0420  LABPROT 15.5*  INR 1.20    Studies/Results: CHEST - 2 VIEW  Comparison: 03/02/2011  Findings: Right-sided dual lead pacer noted. Moderate enlargement  of the cardiomediastinal silhouette noted with evidence of aortic  valvuloplasty and CABG. Left subclavian approach central line tip  terminates at the brachiocephalic/SVC confluence. Improved  bibasilar aeration is noted with decreased, partially loculated  small left pleural effusion. Stable trace right pleural effusion.  No pneumothorax.  IMPRESSION:  Decreased left pleural effusion and improved aeration overall.  Assessment/Plan   Active Problems:  Chronic renal failure, pt has AVF LUE  SLE (systemic lupus erythematosus)  S/P CABG x 2: (LIMA-LAD  SVG - RCA)  Severe AS, tissue AVR this admission  PAF (paroxysmal atrial fibrillation), on Amiodarone prior to admission  Pacemaker, MDT MDT implanted 08/06  Chronic anticoagulation, on Coumadin prior to admission  Anemia, post op, Hgb 7.9  Respiratory failure, hypoxia post op, on bipap currently Hypokalemia  Plan:   POD #7 CABG/AVR, Improved aeration on CXR.   Replacing potassium.  Hgb stable.  BP 90/47 - 123/66 Continuing Lasix per Renal.  ASA, Amio, lopressor, coumadin.  INR 1.20       LOS: 7 days    HAGER,BRYAN W 03/03/2011 12:11 PM     Patient seen and examined. Agree with  assessment and plan. Renal function continues to improve. Mild residual edema right leg. Paced rhythm.    Lennette Bihari, MD, [facility] 03/03/2011 2:33 PM

## 2011-03-03 NOTE — Progress Notes (Signed)
[facility] BRIEF SICU PROGRESS NOTE  7 Days Post-Op  S/P Procedure(s) (LRB): AORTIC VALVE REPLACEMENT (AVR) (N/A) CORONARY ARTERY BYPASS GRAFTING (CABG) (N/A)   Stable day  Plan: Continue current plan  OWEN,CLARENCE H 03/03/2011 9:01 PM

## 2011-03-03 NOTE — Progress Notes (Signed)
UR Completed.  Lindsay Straka Jane [phone] 03/03/2011  

## 2011-03-03 NOTE — Progress Notes (Signed)
Physical Therapy Treatment Patient Details Name: Nathaniel Steele MRN: [id number] DOB: [birth year] Today's Date: 03/03/2011  PT Assessment/Plan  PT - Assessment/Plan Comments on Treatment Session: Pt able to ambulate significantly farther today but still demonstrating decreased activity tolerance and deficits with his balance during ambulation. Still recommend CIR vs SNF for f/u therapies prior to pt going home!  PT Plan: Discharge plan remains appropriate;Frequency remains appropriate PT Frequency: Min 3X/week Recommendations for Other Services: OT consult Follow Up Recommendations: Inpatient Rehab vs Skilled nursing facility (If CIR doesn't work out I would recommend SNF) Equipment Recommended: Defer to next venue PT Goals  Acute Rehab PT Goals PT Goal: Sit to Stand - Progress: Progressing toward goal PT Goal: Stand to Sit - Progress: Progressing toward goal PT Transfer Goal: Bed to Chair/Chair to Bed - Progress: Progressing toward goal PT Goal: Ambulate - Progress: Progressing toward goal  PT Treatment Precautions/Restrictions  Precautions Precautions: Fall;Sternal Precaution Comments: pt with syncopal episode during our session today with standing Restrictions Weight Bearing Restrictions: No Mobility (including Balance) Bed Mobility Bed Mobility: No Transfers Sit to Stand: 4: Min assist;From chair/3-in-1;Without upper extremity assist Sit to Stand Details (indicate cue type and reason): pt stood with minA faciliation for follow through to stand and steady self on his feet (RW in front of pt for him to hold onto) Stand to Sit: 4: Min assist;To chair/3-in-1;Without upper extremity assist Stand to Sit Details: cues for safe technique Ambulation/Gait Ambulation/Gait: Yes Ambulation/Gait Assistance: 4: Min assist Ambulation/Gait Assistance Details (indicate cue type and reason): amb approx 300 ft with RW needing 1 standing rest break and then one seated rest break at about 150 ft; pt  with flexed trunk and short shuffled steps; demonstrates increased lateral sway and needing reminders for self monitoring (concerning BP and SaO2); pt on 4 liters O2 initially but we increased his O2 because his sats kept dropping into low 80s; as pt gets more fatigued he becomes more flexed and impulsive  Ambulation Distance (Feet): 300 Feet Assistive device: Rolling walker Gait Pattern: Trunk flexed;Shuffle  Balance Balance Assessed: Yes Dynamic Standing Balance Dynamic Standing - Balance Support: Bilateral upper extremity supported Dynamic Standing - Level of Assistance: 4: Min assist Exercise    End of Session PT - End of Session Equipment Utilized During Treatment: Gait belt Activity Tolerance: Patient tolerated treatment well Patient left: in chair;with call bell in reach Nurse Communication: Mobility status for transfers;Mobility status for ambulation General Behavior During Session: [facility] for tasks performed Cognition: [facility] for tasks performed  [facility] HELEN 03/03/2011, 2:58 PM

## 2011-03-03 NOTE — Progress Notes (Signed)
Assessment/Plan:  1. Chronic kidney disease-stage IV with worsening following AVR/CABG--now renal fct improving (better than baseline27f 2.99 PTA). Cont Furosemide. OK to d/c dopamine.  2. S./P CABG/AVR-per primary service  3. Anemia- iron studies indicate Fe deficiency. IV Feraheme 510 mg given x 2, aranesp 150/wk start 8 Feb  4. Secondary hyperparathyroidism- Continue calcitriol 0.25 mcg/D.  5. Prior history atrial fibrillation-currently paced rhythm. Had been on Coumadin and amiodarone prior to admission. Now on amio, metoprolol, coumadin  Pacemaker present right subclavian  6. Hypokalemia-K replace   S:Feeling better.  Plans on keeping appt with Dr. Eliott Nine on the 27th. O:BP 113/77  Pulse 79  Temp(Src) 98.5 F (36.9 C) (Oral)  Resp 20  Ht 5\' 7"  (1.702 m)  Wt 77.1 kg (169 lb 15.6 oz)  BMI 26.62 kg/m2  SpO2 97%  Intake/Output Summary (Last 24 hours) at 03/03/11 0857 Last data filed at 03/03/11 0800  Gross per 24 hour  Intake 1543.8 ml  Output   1750 ml  Net -206.2 ml   Weight change: -0.1 kg (-3.5 oz) EAV:WUJWJXB well CVS:RRR Resp:decreased BS bilat lungs esp at bases JYN:WGNF Ext:1-2+ r, 1+ on l    . amiodarone  200 mg Oral Daily  . bisacodyl  10 mg Oral Daily   Or  . bisacodyl  10 mg Rectal Daily  . calcitRIOL  0.25 mcg Oral Daily  . ciprofloxacin  250 mg Oral BID  . darbepoetin (ARANESP) injection - NON-DIALYSIS  150 mcg Subcutaneous Q Fri-1800  . docusate sodium  200 mg Oral Daily  . ferumoxytol  510 mg Intravenous Once  . furosemide  80 mg Oral Daily  . insulin aspart  0-15 Units Subcutaneous TID WC  . insulin aspart  0-5 Units Subcutaneous QHS  . metoprolol tartrate  12.5 mg Oral BID   Or  . metoprolol tartrate  12.5 mg Per Tube BID  . pantoprazole  40 mg Oral Q1200  . phenazopyridine  100 mg Oral TID WC  . rOPINIRole  0.25 mg Oral TID  . sodium chloride  10 mL Intravenous Q12H  . warfarin  2.5 mg Oral q1800  . DISCONTD: furosemide  160 mg Intravenous BID    Dg Chest 2 View  03/03/2011  *RADIOLOGY REPORT*  Clinical Data: Status post CABG  CHEST - 2 VIEW  Comparison: 03/02/2011  Findings: Right-sided dual lead pacer noted.  Moderate enlargement of the cardiomediastinal silhouette noted with evidence of aortic valvuloplasty and CABG.  Left subclavian approach central line tip terminates at the brachiocephalic/SVC confluence.  Improved bibasilar aeration is noted with decreased, partially loculated small left pleural effusion.  Stable trace right pleural effusion. No pneumothorax.  IMPRESSION: Decreased left pleural effusion and improved aeration overall.  Original Report Authenticated By: Harrel Lemon, M.D.   Dg Chest Port 1 View  03/02/2011  *RADIOLOGY REPORT*  Clinical Data: Status post CABG, AVR  PORTABLE CHEST - 1 VIEW  Comparison: 03/01/2011  Findings: Stable layering left pleural effusion.  Left lower lobe opacity, likely atelectasis.  Stable left subclavian venous catheter. Stable right subclavian pacemaker.  Mild cardiomegaly. Postsurgical changes related to prior CABG. Aortic valve prosthesis.  No pneumothorax.  IMPRESSION: Stable layering left pleural effusion.  No pneumothorax.  Original Report Authenticated By: Charline Bills, M.D.   BMET  Lab 03/03/11 0420 03/02/11 0410 03/01/11 0422 02/28/11 0300 02/27/11 0408 02/26/11 1604 02/26/11 0315  NA 139 140 144 149* 145 144 140  K 3.2* 3.6 3.6 3.3* 4.1 4.5 4.5  CL 104  104 108 113* 111 110 108  CO2 28 28 28 27 25 23 24   GLUCOSE 99 99 98 115* 114* 120* 121*  BUN 44* 47* 52* 60* 65* 63* 59*  CREATININE 2.24* 2.35* 2.48* 2.85* 3.35* 3.55* 3.40*  ALB -- -- -- -- -- -- --  CALCIUM 9.6 9.8 9.7 9.3 9.3 9.3 9.0  PHOS 2.9 -- -- -- -- -- --   CBC  Lab 03/03/11 0420 03/02/11 0410 03/01/11 0422 02/28/11 0300  WBC 4.9 5.0 5.7 6.4  NEUTROABS -- -- -- --  HGB 8.5* 8.7* 8.7* 8.5*  HCT 25.1* 27.0* 26.5* 25.7*  MCV 89.3 89.1 88.6 88.9  PLT 88* 85* 82* 74*    Nathaniel Steele C

## 2011-03-03 NOTE — Progress Notes (Signed)
Speech Pathology: Dysphagia Treatment Note  Patient was observed with : Regular, Mechanical Soft / Ground and Thin liquids.  Patient was noted to have s/s of aspiration : No  Lung Sounds:  Clear upper, diminished lower bilaterally Temperature: 98.5  Patient required: supervision cues to consistently follow precautions/strategies  Clinical Impression: Pt was seen by SLP at bedside for skilled observation to assess diet tolerance. No overt s/s of aspiration noted across consistencies, however pt reports difficulty with oral transit of meat. No difficulty with mastication observed by clinician, but pt would prefer mechanical soft diet to facilitate oral phase. Oxygen levels remained in the 90s throughout observation, and most recent CXR indicates improved aeration. Clinician provided education on compensatory strategies and aspiration precautions. Overall, suggest changing diet to D3 with thin liquids per pt preference. No further SLP services are needed at this time as pt continues to present without s/s of aspiration with improved oxygen levels during po intake.   Recommendations:  1. Modify diet to D3/thin liquids 2. Small bites/sips 3. Slow rate, pauses between bites/sips 4. No further SLP services needed at this time  Pain:   none Intervention Required:   No  Goals: All Goals Met   Maxcine Ham, SLP Student

## 2011-03-04 LAB — CBC
HCT: 23.8 % — ABNORMAL LOW (ref 39.0–52.0)
Hemoglobin: 7.7 g/dL — ABNORMAL LOW (ref 13.0–17.0)
MCH: 29.4 pg (ref 26.0–34.0)
MCHC: 32.4 g/dL (ref 30.0–36.0)
MCV: 90.8 fL (ref 78.0–100.0)
Platelets: 85 10*3/uL — ABNORMAL LOW (ref 150–400)
RBC: 2.62 MIL/uL — ABNORMAL LOW (ref 4.22–5.81)
RDW: 18.1 % — ABNORMAL HIGH (ref 11.5–15.5)
WBC: 4.9 10*3/uL (ref 4.0–10.5)

## 2011-03-04 LAB — BASIC METABOLIC PANEL
Chloride: 106 mEq/L (ref 96–112)
GFR calc Af Amer: 25 mL/min — ABNORMAL LOW (ref >90)
Potassium: 3.8 mEq/L (ref 3.5–5.1)

## 2011-03-04 LAB — GLUCOSE, CAPILLARY
Glucose-Capillary: 92 mg/dL (ref 70–99)
Glucose-Capillary: 107 mg/dL — ABNORMAL HIGH (ref 70–99)
Glucose-Capillary: 104 mg/dL — ABNORMAL HIGH (ref 70–99)
Glucose-Capillary: 115 mg/dL — ABNORMAL HIGH (ref 70–99)

## 2011-03-04 LAB — PROTIME-INR: Prothrombin Time: 15.5 seconds — ABNORMAL HIGH (ref 11.6–15.2)

## 2011-03-04 MED ORDER — POLYSACCHARIDE IRON 150 MG PO CAPS
150.0000 mg | ORAL_CAPSULE | Freq: Every day | ORAL | Status: DC
  Administered 2011-03-04 – 2011-03-12 (×9): 150 mg via ORAL
  Filled 2011-03-04 (×9): qty 1

## 2011-03-04 NOTE — Progress Notes (Signed)
8 Days Post-Op Procedure(s) (LRB):  AORTIC VALVE REPLACEMENT (AVR) (N/A)  CORONARY ARTERY BYPASS GRAFTING (CABG) (N/A)    Subjective: Sitting up in chair. No complaints.  Has already been for walk this AM  Objective: Vital signs in last 24 hours: Temp:  [97.5 F (36.4 C)-98.7 F (37.1 C)] 98.1 F (36.7 C) (02/12 0736) Pulse Rate:  [65-102] 99  (02/12 0900) Resp:  [16-28] 18  (02/12 0900) BP: (104-135)/(54-86) 111/74 mmHg (02/12 0900) SpO2:  [91 %-98 %] 95 % (02/12 0900) Weight:  [78.1 kg (172 lb 2.9 oz)] 78.1 kg (172 lb 2.9 oz) (02/12 0500) Weight change: 1 kg (2 lb 3.3 oz) Last BM Date: 03/02/11 Intake/Output from previous day:  -306 02/11 0701 - 02/12 0700 In: 795.8 [P.O.:720; I.V.:75.8] Out: 1425 [Urine:1425] Intake/Output this shift: Total I/O In: 120 [P.O.:120] Out: 100 [Urine:100]  PE: General:A&O X 3, pleasant affect Heart:S1S2, RRR, Lungs:few rhonchi. Abd:+BS, soft, non tender Ext:Rt. Leg with 1-2+ edema. None with lt. Leg. Neuro:MAE, follows commands.   Lab Results:  Basename 03/04/11 0404 03/03/11 0420  WBC 4.9 4.9  HGB 7.7* 8.5*  HCT 23.8* 25.1*  PLT 85* 88*   BMET  Basename 03/04/11 0404 03/03/11 0420  NA 139 139  K 3.8 3.2*  CL 106 104  CO2 28 28  GLUCOSE 97 99  BUN 46* 44*  CREATININE 2.60* 2.24*  CALCIUM 9.5 9.6   No results found for this basename: TROPONINI:2,CK,MB:2 in the last 72 hours  Lab Results  Component Value Date   CHOL  Value: 95        ATP III CLASSIFICATION:  <200     mg/dL   Desirable  161-096  mg/dL   Borderline High  >=045    mg/dL   High 4/0/9811   HDL 35* 06/29/2006   LDLCALC  Value: 50        Total Cholesterol/HDL:CHD Risk Coronary Heart Disease Risk Table                     Men   Women  1/2 Average Risk   3.4   3.3 06/29/2006   TRIG 52 06/29/2006   CHOLHDL 2.7 06/29/2006   Lab Results  Component Value Date   HGBA1C 5.8* 02/20/2011       Hepatic Function Panel No results found for this basename:  PROT,ALBUMIN,AST,ALT,ALKPHOS,BILITOT,BILIDIR,IBILI in the last 72 hours No results found for this basename: CHOL in the last 72 hours No results found for this basename: PROTIME in the last 72 hours    EKG: Orders placed during the hospital encounter of 02/24/11  . EKG 12-LEAD  . EKG 12-LEAD    Studies/Results: Dg Chest 2 View  03/03/2011  *RADIOLOGY REPORT*  Clinical Data: Status post CABG  CHEST - 2 VIEW  Comparison: 03/02/2011  Findings: Right-sided dual lead pacer noted.  Moderate enlargement of the cardiomediastinal silhouette noted with evidence of aortic valvuloplasty and CABG.  Left subclavian approach central line tip terminates at the brachiocephalic/SVC confluence.  Improved bibasilar aeration is noted with decreased, partially loculated small left pleural effusion.  Stable trace right pleural effusion. No pneumothorax.  IMPRESSION: Decreased left pleural effusion and improved aeration overall.  Original Report Authenticated By: Harrel Lemon, M.D.    Medications: I have reviewed the patient's current medications.    Marland Kitchen amiodarone  200 mg Oral Daily  . bisacodyl  10 mg Oral Daily   Or  . bisacodyl  10 mg Rectal Daily  . calcitRIOL  0.25 mcg Oral Daily  . darbepoetin (ARANESP) injection - NON-DIALYSIS  150 mcg Subcutaneous Q Fri-1800  . docusate sodium  200 mg Oral Daily  . furosemide  80 mg Oral Daily  . insulin aspart  0-15 Units Subcutaneous TID WC  . insulin aspart  0-5 Units Subcutaneous QHS  . metoprolol tartrate  12.5 mg Oral BID   Or  . metoprolol tartrate  12.5 mg Per Tube BID  . pantoprazole  40 mg Oral Q1200  . polysaccharide iron  150 mg Oral Daily  . potassium chloride  40 mEq Oral BID  . rOPINIRole  0.25 mg Oral TID  . sodium chloride  10 mL Intravenous Q12H  . warfarin  2.5 mg Oral q1800  . DISCONTD: ciprofloxacin  250 mg Oral BID   Assessment/Plan: Patient Active Problem List  Diagnoses  . Chronic renal failure, pt has AVF LUE  . SLE (systemic  lupus erythematosus)  . 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 Severe AS, tissue AVR this admission  . PAF (paroxysmal atrial fibrillation), on Amiodarone prior to admission  . Pacemaker, MDT MDT implanted 08/06  . Chronic anticoagulation, on Coumadin prior to admission  . Anemia, post op, Hgb 7.9  . Respiratory failure, hypoxia post op, on bipap currently   PLAN:  VS stable, resp. Up to 28 at times. A. Fib with v pacing. On PO amiodarone and low dose lopressor. Anemia continues on aranesp and iron continue. On coumadin with INR 1.20.   LOS: 8 days   Yenifer Saccente R 03/04/2011, 11:02 AM

## 2011-03-04 NOTE — Progress Notes (Signed)
Pt. Seen and examined. Agree with the NP/PA-C note as written.  Continues to improve. A-fib on amiodarone and coumadin, INR not therapeutic. Continuous v- pacing in the 90's. Consider increasing lopressor to 25 mg BID.  Chrystie Nose, MD Attending Cardiologist [facility]

## 2011-03-04 NOTE — Progress Notes (Addendum)
[address]            [place] [address]          [phone]     8 Days Post-Op Procedure(s) (LRB): AORTIC VALVE REPLACEMENT (AVR) (N/A) CORONARY ARTERY BYPASS GRAFTING (CABG) (N/A)  Subjective: Feels better today.  Eating better and walked earlier this am.  Some soreness, but denies SOB or dizziness.  Objective: Vital signs in last 24 hours: Patient Vitals for the past 24 hrs:  BP Temp Temp src Pulse Resp SpO2 Weight  03/04/11 0900 111/74 mmHg - - 99  18  95 % -  03/04/11 0800 109/65 mmHg - - 95  20  96 % -  03/04/11 0736 - 98.1 F (36.7 C) Oral - - - -  03/04/11 0700 121/70 mmHg - - 95  23  95 % -  03/04/11 0600 126/72 mmHg - - 92  19  93 % -  03/04/11 0500 119/62 mmHg - - 92  23  92 % 78.1 kg (172 lb 2.9 oz)  03/04/11 0400 115/81 mmHg 97.6 F (36.4 C) - 91  22  95 % -  03/04/11 0300 135/56 mmHg - - 91  28  91 % -  03/04/11 0200 118/86 mmHg - - 90  22  95 % -  03/04/11 0100 124/63 mmHg - - 90  16  95 % -  03/04/11 0000 112/60 mmHg - - 91  19  95 % -  03/03/11 2300 111/62 mmHg - - 93  19  96 % -  03/03/11 2200 130/63 mmHg - - 95  19  91 % -  03/03/11 2100 120/63 mmHg - - 96  21  94 % -  03/03/11 2000 129/69 mmHg - - 97  24  97 % -  03/03/11 1922 - 98.7 F (37.1 C) Oral - - - -  03/03/11 1900 110/67 mmHg - - 87  27  91 % -  03/03/11 1800 110/78 mmHg - - 73  24  96 % -  03/03/11 1700 122/71 mmHg - - 75  23  98 % -  03/03/11 1635 - 98.5 F (36.9 C) Oral - - - -  03/03/11 1600 116/54 mmHg - - 74  19  92 % -  03/03/11 1500 105/57 mmHg - - 72  19  97 % -  03/03/11 1430 104/64 mmHg - - 77  22  98 % -  03/03/11 1400 104/66 mmHg - - 102  24  94 % -  03/03/11 1345 108/59 mmHg - - 86  23  96 % -  03/03/11 1330 112/73 mmHg - - 77  22  97 % -  03/03/11 1315 110/64 mmHg - - 65  23  98 % -  03/03/11 1300 125/61 mmHg - - 74  21  98 % -  03/03/11 1245 110/57 mmHg - - 77  22  93 % -  03/03/11 1200 113/68 mmHg - - 75  24  97 % -  03/03/11 1155  - 97.5 F (36.4 C) Oral - - - -  03/03/11 1100 100/56 mmHg - - 80  - 99 % -   Current Weight  03/04/11 78.1 kg (172 lb 2.9 oz)  Pre-op wt= 76.6 kg   Intake/Output from previous day: 02/11 0701 - 02/12 0700 In: 795.8 [P.O.:720; I.V.:75.8] Out: 1425 [Urine:1425]  PHYSICAL EXAM:  Heart: RRR Lungs:clear, slightly decreased in bases Wound: clean and dry Extremities: +RLE edema  Lab Results: CBC: Basename 03/04/11 0404 03/03/11 0420  WBC 4.9 4.9  HGB 7.7* 8.5*  HCT 23.8* 25.1*  PLT 85* 88*   BMET:  Basename 03/04/11 0404 03/03/11 0420  NA 139 139  K 3.8 3.2*  CL 106 104  CO2 28 28  GLUCOSE 97 99  BUN 46* 44*  CREATININE 2.60* 2.24*  CALCIUM 9.5 9.6    PT/INR:  Basename 03/04/11 0404  LABPROT 15.5*  INR 1.20   Urine cx- no growth  Assessment/Plan: S/P Procedure(s) (LRB): AORTIC VALVE REPLACEMENT (AVR) (N/A) CORONARY ARTERY BYPASS GRAFTING (CABG) (N/A) CV- h/o AF, now SR, BPs controlled. Continue Lopressor, Amiodarone, Coumadin. CKD- appreciate renal's assistance.  Continue diuresis for vol overload. Postop ABL anemia- receiving Aranesp.  Will Rx Fe. ?UTI- urine cx negative.  Will d/c abx. (D#5) CRPI Thrombocytopenia- plts stable.   LOS: 8 days    Steele,Nathaniel H 03/04/2011   I have seen and examined Nathaniel Steele and agree with the above assessment  and plan.  Delight Ovens MD Beeper [phone] Office [phone] 03/04/2011 6:54 PM

## 2011-03-04 NOTE — Progress Notes (Signed)
Assessment/Plan:  1. Chronic kidney disease-stage IV (better than baseline of 2.99 PTA). Cont Furosemide. Needs more aggressive dosing. 2. S./P CABG/AVR-per primary service  3  Post op Volume overload Defer ongoing management of diuretics to Dr. Morton Peters.  We will sign off.  S:No complaints O:BP 109/65  Pulse 95  Temp(Src) 98.1 F (36.7 C) (Oral)  Resp 20  Ht 5\' 7"  (1.702 m)  Wt 78.1 kg (172 lb 2.9 oz)  BMI 26.97 kg/m2  SpO2 96%  Intake/Output Summary (Last 24 hours) at 03/04/11 0939 Last data filed at 03/04/11 0700  Gross per 24 hour  Intake    510 ml  Output   1275 ml  Net   -765 ml   Weight change: 1 kg (2 lb 3.3 oz) WUJ:WJXBJYN well CVS:RRR Resp:decreased BS bilat WGN:FAOZ Back presacral edema 1-2+ Ext:1-2+ on right, trace on left    . amiodarone  200 mg Oral Daily  . bisacodyl  10 mg Oral Daily   Or  . bisacodyl  10 mg Rectal Daily  . calcitRIOL  0.25 mcg Oral Daily  . ciprofloxacin  250 mg Oral BID  . darbepoetin (ARANESP) injection - NON-DIALYSIS  150 mcg Subcutaneous Q Fri-1800  . docusate sodium  200 mg Oral Daily  . furosemide  80 mg Oral Daily  . insulin aspart  0-15 Units Subcutaneous TID WC  . insulin aspart  0-5 Units Subcutaneous QHS  . metoprolol tartrate  12.5 mg Oral BID   Or  . metoprolol tartrate  12.5 mg Per Tube BID  . pantoprazole  40 mg Oral Q1200  . potassium chloride  40 mEq Oral BID  . rOPINIRole  0.25 mg Oral TID  . sodium chloride  10 mL Intravenous Q12H  . warfarin  2.5 mg Oral q1800  . DISCONTD: furosemide  80 mg Oral BID  . DISCONTD: furosemide  80 mg Oral Daily  . DISCONTD: phenazopyridine  100 mg Oral TID WC   Dg Chest 2 View  03/03/2011  *RADIOLOGY REPORT*  Clinical Data: Status post CABG  CHEST - 2 VIEW  Comparison: 03/02/2011  Findings: Right-sided dual lead pacer noted.  Moderate enlargement of the cardiomediastinal silhouette noted with evidence of aortic valvuloplasty and CABG.  Left subclavian approach central line  tip terminates at the brachiocephalic/SVC confluence.  Improved bibasilar aeration is noted with decreased, partially loculated small left pleural effusion.  Stable trace right pleural effusion. No pneumothorax.  IMPRESSION: Decreased left pleural effusion and improved aeration overall.  Original Report Authenticated By: Harrel Lemon, M.D.   BMET  Lab 03/04/11 0404 03/03/11 0420 03/02/11 0410 03/01/11 0422 02/28/11 0300 02/27/11 0408 02/26/11 1604  NA 139 139 140 144 149* 145 144  K 3.8 3.2* 3.6 3.6 3.3* 4.1 4.5  CL 106 104 104 108 113* 111 110  CO2 28 28 28 28 27 25 23   GLUCOSE 97 99 99 98 115* 114* 120*  BUN 46* 44* 47* 52* 60* 65* 63*  CREATININE 2.60* 2.24* 2.35* 2.48* 2.85* 3.35* 3.55*  ALB -- -- -- -- -- -- --  CALCIUM 9.5 9.6 9.8 9.7 9.3 9.3 9.3  PHOS -- 2.9 -- -- -- -- --   CBC  Lab 03/04/11 0404 03/03/11 0420 03/02/11 0410 03/01/11 0422  WBC 4.9 4.9 5.0 5.7  NEUTROABS -- -- -- --  HGB 7.7* 8.5* 8.7* 8.7*  HCT 23.8* 25.1* 27.0* 26.5*  MCV 90.8 89.3 89.1 88.6  PLT 85* 88* 85* 82*    Bynum Mccullars C

## 2011-03-04 NOTE — Progress Notes (Signed)
CSW received referral for pt needing possible SNF placement. Pt resting at present, CSW left message for pt wife by phone and will follow to facilitate d/c planning, as needed.  Baxter Flattery, MSW [phone]

## 2011-03-04 NOTE — Progress Notes (Signed)
Physical Therapy Treatment Patient Details Name: Nathaniel Steele MRN: [id number] DOB: 05-May-1926 Today's Date: 03/04/2011  PT Assessment/Plan  PT - Assessment/Plan Comments on Treatment Session: Pt is slightly impulsive and independently minded. Concerns me for going home with just his wife. Would benefit SNF vs CIR for continued therapies to address activity tolerance and balance for increased safety at home with wife.  PT Plan: Discharge plan remains appropriate;Frequency remains appropriate Equipment Recommended: Defer to next venue PT Goals  Acute Rehab PT Goals PT Goal: Sit to Supine/Side - Progress: Progressing toward goal PT Goal: Sit to Stand - Progress: Progressing toward goal PT Goal: Stand to Sit - Progress: Progressing toward goal PT Transfer Goal: Bed to Chair/Chair to Bed - Progress: Progressing toward goal PT Goal: Ambulate - Progress: Progressing toward goal PT Goal: Perform Home Exercise Program - Progress: Progressing toward goal  PT Treatment Precautions/Restrictions  Precautions Precautions: Sternal;Fall Precaution Comments: pt with syncopal episode during our session today with standing Restrictions Weight Bearing Restrictions: No Mobility (including Balance) Bed Mobility Bed Mobility: No Transfers Sit to Stand: 4: Min assist;From chair/3-in-1;Without upper extremity assist Sit to Stand Details (indicate cue type and reason): cues for safe hand placement and for follow through Stand to Sit: 4: Min assist Stand to Sit Details: min to control descent to chair Ambulation/Gait Ambulation/Gait Assistance: 4: Min assist Ambulation/Gait Assistance Details (indicate cue type and reason): amb. approx 310 ft with RW; 2 standing rest breaks to catch his breath, pt breathes through mouth and sats drop into 80s on 4 L so upped his O2 to 6 during ambulation;  pt very hurried during ambulation causing increased DOE; cues for safe speed and controlled breathing Ambulation  Distance (Feet): 310 Feet Assistive device: Rolling walker Gait Pattern: Trunk flexed    Exercise  Total Joint Exercises Ankle Circles/Pumps: AROM;10 reps;Seated;Both Long Arc Quad: AROM;Both;10 reps;Seated End of Session PT - End of Session Equipment Utilized During Treatment: Gait belt Activity Tolerance: Patient tolerated treatment well Patient left: in chair;with call bell in reach Nurse Communication: Mobility status for transfers;Mobility status for ambulation General Behavior During Session: [facility] for tasks performed Cognition: [facility] for tasks performed  [facility] HELEN 03/04/2011, 2:20 PM

## 2011-03-05 LAB — CBC
MCV: 92.4 fL (ref 78.0–100.0)
Platelets: 98 10*3/uL — ABNORMAL LOW (ref 150–400)
RDW: 19 % — ABNORMAL HIGH (ref 11.5–15.5)
WBC: 5.9 10*3/uL (ref 4.0–10.5)

## 2011-03-05 LAB — BASIC METABOLIC PANEL
Chloride: 103 mEq/L (ref 96–112)
Creatinine, Ser: 2.72 mg/dL — ABNORMAL HIGH (ref 0.50–1.35)
GFR calc Af Amer: 23 mL/min — ABNORMAL LOW (ref >90)
Potassium: 3.9 mEq/L (ref 3.5–5.1)

## 2011-03-05 LAB — PROTIME-INR
INR: 1.21 (ref 0.00–1.49)
Prothrombin Time: 15.6 seconds — ABNORMAL HIGH (ref 11.6–15.2)

## 2011-03-05 LAB — GLUCOSE, CAPILLARY
Glucose-Capillary: 96 mg/dL (ref 70–99)
Glucose-Capillary: 98 mg/dL (ref 70–99)
Glucose-Capillary: 124 mg/dL — ABNORMAL HIGH (ref 70–99)
Glucose-Capillary: 110 mg/dL — ABNORMAL HIGH (ref 70–99)

## 2011-03-05 LAB — PREPARE RBC (CROSSMATCH)

## 2011-03-05 MED ORDER — FINASTERIDE 5 MG PO TABS
5.0000 mg | ORAL_TABLET | Freq: Every day | ORAL | Status: DC
  Administered 2011-03-05 – 2011-03-12 (×8): 5 mg via ORAL
  Filled 2011-03-05 (×9): qty 1

## 2011-03-05 MED ORDER — SODIUM CHLORIDE 0.9 % IJ SOLN: 3.0000 mL | INTRAMUSCULAR | Status: DC | PRN

## 2011-03-05 MED ORDER — ALBUTEROL SULFATE HFA 108 (90 BASE) MCG/ACT IN AERS
2.0000 | INHALATION_SPRAY | RESPIRATORY_TRACT | Status: DC | PRN
  Filled 2011-03-05: qty 6.7

## 2011-03-05 MED ORDER — SODIUM CHLORIDE 0.9 % IV SOLN: 250.0000 mL | INTRAVENOUS | Status: DC | PRN

## 2011-03-05 MED ORDER — SODIUM CHLORIDE 0.9 % IJ SOLN
3.0000 mL | Freq: Two times a day (BID) | INTRAMUSCULAR | Status: DC
  Administered 2011-03-06 – 2011-03-12 (×6): 3 mL via INTRAVENOUS

## 2011-03-05 MED ORDER — DIPHENHYDRAMINE HCL 25 MG PO CAPS: 25.0000 mg | ORAL_CAPSULE | Freq: Every evening | ORAL | Status: DC | PRN

## 2011-03-05 MED ORDER — MOVING RIGHT ALONG BOOK
Freq: Once | Status: AC
  Administered 2011-03-05: 16:00:00
  Filled 2011-03-05: qty 1

## 2011-03-05 NOTE — Progress Notes (Signed)
RT assessment done at this time prior to floor transfer. I agree with all the things that have already been ordered (IS, O2 prn, etc) but I also added an Albuterol MDI prn. He stated that he had used one here previously, and would like to have one to take home. RT will continue to monitor.

## 2011-03-05 NOTE — Progress Notes (Signed)
Subjective: Status post aVR-CABG for severe AS and CHF Post op day 9 COPD  Sitting up in chair, no complaints.  Objective: Vital signs in last 24 hours: Temp:  [97.5 F (36.4 C)-99.2 F (37.3 C)] 97.7 F (36.5 C) (02/13 0742) Pulse Rate:  [89-96] 96  (02/13 1000) Resp:  [17-27] 23  (02/13 1000) BP: (80-146)/(54-92) 136/58 mmHg (02/13 1000) SpO2:  [90 %-99 %] 97 % (02/13 1000) Weight:  [79.2 kg (174 lb 9.7 oz)] 79.2 kg (174 lb 9.7 oz) (02/13 0600) Weight change: 1.1 kg (2 lb 6.8 oz) Last BM Date: 03/02/11 Intake/Output from previous day: -1205 02/12 0701 - 02/13 0700 In: 450 [P.O.:450] Out: 1355 [Urine:1355] Intake/Output this shift: Total I/O In: 390 [P.O.:360; I.V.:30] Out: 150 [Urine:150]  PE: General:A&O X 3, pleasant affect  Heart:S1S2 RRR  Lungs:cleatr Abd:+BS, soft non tender Ext:no edema to trace.   V. Pacing A Lab Results:  Basename 03/05/11 0350 03/04/11 0404  WBC 5.9 4.9  HGB 7.8* 7.7*  HCT 24.2* 23.8*  PLT 98* 85*   BMET  Basename 03/05/11 0350 03/04/11 0404  NA 136 139  K 3.9 3.8  CL 103 106  CO2 26 28  GLUCOSE 102* 97  BUN 49* 46*  CREATININE 2.72* 2.60*  CALCIUM 9.5 9.5     Lab Results  Component Value Date   CHOL  Value: 95        ATP III CLASSIFICATION:  <200     mg/dL   Desirable  440-102  mg/dL   Borderline High  >=725    mg/dL   High 03/26/6438   HDL 35* 06/29/2006   LDLCALC  Value: 50        Total Cholesterol/HDL:CHD Risk Coronary Heart Disease Risk Table                     Men   Women  1/2 Average Risk   3.4   3.3 06/29/2006   TRIG 52 06/29/2006   CHOLHDL 2.7 06/29/2006   Lab Results  Component Value Date   HGBA1C 5.8* 02/20/2011        EKG: Orders placed during the hospital encounter of 02/24/11  . EKG 12-LEAD  . EKG 12-LEAD    Studies/Results: No results found. 2 view CXR 03/03/11: Findings: Right-sided dual lead pacer noted. Moderate enlargement  of the cardiomediastinal silhouette noted with evidence of aortic    valvuloplasty and CABG. Left subclavian approach central line tip  terminates at the brachiocephalic/SVC confluence. Improved  bibasilar aeration is noted with decreased, partially loculated  small left pleural effusion. Stable trace right pleural effusion.  No pneumothorax.  IMPRESSION:  Decreased left pleural effusion and improved aeration overall.    Medications: I have reviewed the patient's current medications.    Marland Kitchen amiodarone  200 mg Oral Daily  . bisacodyl  10 mg Oral Daily   Or  . bisacodyl  10 mg Rectal Daily  . calcitRIOL  0.25 mcg Oral Daily  . darbepoetin (ARANESP) injection - NON-DIALYSIS  150 mcg Subcutaneous Q Fri-1800  . docusate sodium  200 mg Oral Daily  . furosemide  80 mg Oral Daily  . insulin aspart  0-15 Units Subcutaneous TID WC  . insulin aspart  0-5 Units Subcutaneous QHS  . metoprolol tartrate  12.5 mg Oral BID   Or  . metoprolol tartrate  12.5 mg Per Tube BID  . moving right along book   Does not apply Once  . pantoprazole  40 mg  Oral Q1200  . polysaccharide iron  150 mg Oral Daily  . rOPINIRole  0.25 mg Oral TID  . sodium chloride  10 mL Intravenous Q12H  . sodium chloride  3 mL Intravenous Q12H  . warfarin  2.5 mg Oral q1800   Assessment/Plan: Patient Active Problem List  Diagnoses  . Chronic renal failure, pt has AVF LUE  . SLE (systemic lupus erythematosus)  . 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 Severe AS, tissue AVR this admission  . PAF (paroxysmal atrial fibrillation), on Amiodarone prior to admission  . Pacemaker, MDT MDT implanted 08/06  . Chronic anticoagulation, on Coumadin prior to admission  . Anemia, post op, Hgb 7.9  . Respiratory failure, hypoxia post op, on bipap currently   PLAN: Serum cr. Slowly increasing,   BP somewhat labile, 93-136 systolic. PPM pacing. A. Fib on po amiodarone. Coumadin has been restarted.    LOS: 9 days   INGOLD,LAURA R 03/05/2011, 10:51 AM   Patient seen and examined. Agree with  assessment and plan. No chest pain or SOB.  V paced at 90. Cr is starting to increase again to 2.72. Consider decreasing Lasix dose.   Lennette Bihari, MD, [facility] 03/05/2011 11:19 AM

## 2011-03-05 NOTE — Progress Notes (Signed)
9 Days Post-Op Procedure(s) (LRB): AORTIC VALVE REPLACEMENT (AVR) (N/A) CORONARY ARTERY BYPASS GRAFTING (CABG) (N/A)                     [address]            [name] [address]          [phone]     Subjective: Status post aVR-CABG for severe AS and CHF COPD Deconditioning chronic renal failure with AV fistula but pre-dialysis Benign tremor  Chronic intermittent atrial fibrillation Chronic Coumadin therapy, chronic anemia    Objective: Vital signs in last 24 hours: Temp:  [97.5 F (36.4 C)-99.2 F (37.3 C)] 97.7 F (36.5 C) (02/13 0742) Pulse Rate:  [89-96] 94  (02/13 0900) Cardiac Rhythm:  [-] Ventricular paced (02/13 0900) Resp:  [17-27] 24  (02/13 0900) BP: (80-146)/(54-92) 93/57 mmHg (02/13 0900) SpO2:  [90 %-99 %] 90 % (02/13 0900) Weight:  [174 lb 9.7 oz (79.2 kg)] 174 lb 9.7 oz (79.2 kg) (02/13 0600)  Hemodynamic parameters for last 24 hours:   atrially paced 90 per minute blood pressure 130/60 saturation 90% on room air  Intake/Output from previous day: 02/12 0701 - 02/13 0700 In: 450 [P.O.:450] Out: 1355 [Urine:1355] Intake/Output this shift: Total I/O In: 390 [P.O.:360; I.V.:30] Out: -   Exam--lungs clear no cardiac murmur extremities warm with mild edema incisions clean and dry central line site clean  Lab Results:  [facility] 03/05/11 0350 03/04/11 0404  WBC 5.9 4.9  HGB 7.8* 7.7*  HCT 24.2* 23.8*  PLT 98* 85*   BMET:  Basename 03/05/11 0350 03/04/11 0404  NA 136 139  K 3.9 3.8  CL 103 106  CO2 26 28  GLUCOSE 102* 97  BUN 49* 46*  CREATININE 2.72* 2.60*  CALCIUM 9.5 9.5    PT/INR:  Basename 03/05/11 0350  LABPROT 15.6*  INR 1.21   ABG    Component Value Date/Time   PHART 7.405 02/27/2011 0351   HCO3 23.3 02/27/2011 0351   TCO2 24 02/27/2011 0351   ACIDBASEDEF 1.0 02/27/2011 0351   O2SAT 94.0 02/27/2011 0351   CBG (last 3)   Basename 03/05/11 0739 03/04/11 2152 03/04/11 1657  GLUCAP 96 115* 104*     Assessment/Plan: S/P Procedure(s) (LRB): AORTIC VALVE REPLACEMENT (AVR) (N/A) CORONARY ARTERY BYPASS GRAFTING (CABG) (N/A) Chronic anemia from chronic renal failure now with hemoglobin 7.7 will need blood transfusion, continue IV Lasix per renal Plan for transfer to step-down: see transfer orders   LOS: 9 days    VAN TRIGT III,Rosalio Catterton 03/05/2011

## 2011-03-06 ENCOUNTER — Inpatient Hospital Stay (HOSPITAL_COMMUNITY): Payer: Medicare Other

## 2011-03-06 LAB — BASIC METABOLIC PANEL
BUN: 50 mg/dL — ABNORMAL HIGH (ref 6–23)
CO2: 25 mEq/L (ref 19–32)
Calcium: 9.5 mg/dL (ref 8.4–10.5)
Chloride: 104 mEq/L (ref 96–112)
Creatinine, Ser: 2.55 mg/dL — ABNORMAL HIGH (ref 0.50–1.35)
GFR calc Af Amer: 25 mL/min — ABNORMAL LOW (ref >90)
GFR calc non Af Amer: 22 mL/min — ABNORMAL LOW (ref >90)
Glucose, Bld: 97 mg/dL (ref 70–99)
Potassium: 4 mEq/L (ref 3.5–5.1)
Sodium: 136 mEq/L (ref 135–145)

## 2011-03-06 LAB — TYPE AND SCREEN
ABO/RH(D): A POS
Antibody Screen: NEGATIVE
Unit division: 0

## 2011-03-06 LAB — CBC
HCT: 26.4 % — ABNORMAL LOW (ref 39.0–52.0)
Hemoglobin: 8.6 g/dL — ABNORMAL LOW (ref 13.0–17.0)
MCH: 29.8 pg (ref 26.0–34.0)
MCHC: 32.6 g/dL (ref 30.0–36.0)
MCV: 91.3 fL (ref 78.0–100.0)
Platelets: 106 10*3/uL — ABNORMAL LOW (ref 150–400)
RBC: 2.89 MIL/uL — ABNORMAL LOW (ref 4.22–5.81)
RDW: 18.6 % — ABNORMAL HIGH (ref 11.5–15.5)
WBC: 6.4 10*3/uL (ref 4.0–10.5)

## 2011-03-06 LAB — GLUCOSE, CAPILLARY: Glucose-Capillary: 90 mg/dL (ref 70–99)

## 2011-03-06 LAB — PROTIME-INR
INR: 1.18 (ref 0.00–1.49)
Prothrombin Time: 15.2 seconds (ref 11.6–15.2)

## 2011-03-06 MED ORDER — SODIUM CHLORIDE 0.9 % IJ SOLN
10.0000 mL | INTRAMUSCULAR | Status: DC | PRN
  Administered 2011-03-07 – 2011-03-08 (×2): 30 mL
  Administered 2011-03-09 – 2011-03-11 (×4): 10 mL

## 2011-03-06 MED ORDER — METOPROLOL TARTRATE 25 MG PO TABS
25.0000 mg | ORAL_TABLET | Freq: Two times a day (BID) | ORAL | Status: DC
  Administered 2011-03-06 – 2011-03-12 (×13): 25 mg via ORAL
  Filled 2011-03-06 (×14): qty 1

## 2011-03-06 NOTE — Progress Notes (Addendum)
10 Days Post-Op Procedure(s) (LRB): AORTIC VALVE REPLACEMENT (AVR) (N/A) CORONARY ARTERY BYPASS GRAFTING (CABG) (N/A)  Subjective: Patient eating breakfast without complaints.  Objective: Vital signs in last 24 hours: Patient Vitals for the past 24 hrs:  BP Temp Temp src Pulse Resp SpO2  03/06/11 0500 130/66 mmHg 98.6 F (37 C) Oral 83  18  90 %  03/05/11 2047 119/62 mmHg 99.5 F (37.5 C) Oral 71  18  91 %  03/05/11 1604 134/74 mmHg 97.6 F (36.4 C) Oral 95  21  91 %  03/05/11 1515 129/72 mmHg 98.7 F (37.1 C) Oral 92  30  -  03/05/11 1500 129/72 mmHg - - 92  22  93 %  03/05/11 1445 - 98.6 F (37 C) - 93  21  -  03/05/11 1400 109/63 mmHg 98.5 F (36.9 C) - 93  23  96 %  03/05/11 1330 - 98.3 F (36.8 C) - 97  17  -  03/05/11 1300 124/67 mmHg 98.5 F (36.9 C) - 94  23  94 %  03/05/11 1250 - 98.6 F (37 C) Oral 94  23  -  03/05/11 1245 132/69 mmHg 99 F (37.2 C) Oral 95  32  -  03/05/11 1200 132/69 mmHg - - 94  20  95 %  03/05/11 1143 - 97.3 F (36.3 C) Oral - - -  03/05/11 1100 121/63 mmHg - - 95  22  98 %  03/05/11 1000 136/58 mmHg - - 96  23  97 %  03/05/11 0900 93/57 mmHg - - 94  24  90 %  03/05/11 0800 117/69 mmHg - - 93  18  91 %   Pre op weight  76.7 kg Current Weight  03/05/11 174 lb 9.7 oz (79.2 kg)      Intake/Output from previous day: 02/13 0701 - 02/14 0700 In: 1100 [P.O.:720; I.V.:30; Blood:350] Out: 600 [Urine:600]   Physical Exam:  Cardiovascular: IRRR,IRRR . Pulmonary: Clear to auscultation bilaterally; no rales, wheezes, or rhonchi. Abdomen: Soft, non tender, bowel sounds present. Extremities: Mild bilateral lower extremity edema.Left AV fistula with bruit and thrill. Wounds: Clean and dry.  No erythema or signs of infection.  Lab Results: CBC: Basename 03/06/11 0551 03/05/11 0350  WBC 6.4 5.9  HGB 8.6* 7.8*  HCT 26.4* 24.2*  PLT 106* 98*   BMET:  Basename 03/06/11 0551 03/05/11 0350  NA 136 136  K 4.0 3.9  CL 104 103  CO2 25 26    GLUCOSE 97 102*  BUN 50* 49*  CREATININE 2.55* 2.72*  CALCIUM 9.5 9.5    PT/INR:  Basename 03/06/11 0551  LABPROT 15.2  INR 1.18   ABG:  INR: Will add last result for INR, ABG once components are confirmed Will add last 4 CBG results once components are confirmed  Assessment/Plan:  1. CV - Afib with CVR (patient with a previous history) and V paced. Continue Amiodarone 200 daily,increase  Lopressor 25 bid, and Coumadin. 2.  Pulmonary - Encourage incentive spirometer.CXR this am shows mild bibasilar airspace disease and possible increase in left pl effusion.Wean O2 as tolerates.He does have a history of COPD so may need home O2. 3. Volume Overload - On Lasix 80 daily. 4.  Acute blood loss anemia - H/H slightly increased to 8.6/26.4.Previously transfused. Continue Ni ferex and Aranesp. 5.CKD (stage IV)-has left AV fistula. Has not been started on dialysis yet. Creatinine this am down to 2.55.Per nephrology. 6.Pre op HGA1C 5.8. CBGs have been less  than 120. Will stop accu checks and SS. Will need follow up as an outpatient. 7.Thrombocytopenia-Platelets increased to 106,000.  ZIMMERMAN,DONIELLE MPA-C 03/06/2011   patient examined and medical record reviewed,agree with above note. VAN TRIGT III,Viviana Trimble   the patient continues to have desaturation with ambulation despite the use of 2-3 L oxygen nasal cannula. We will continue with his inpatient rehabilitation and hopefully wean off oxygen before discharge. The patient was not on home oxygen prior to surgery. His weight is still up 5 pounds over preop. 03/06/2011

## 2011-03-06 NOTE — Progress Notes (Signed)
CARDIAC REHAB PHASE I   PRE:  Rate/Rhythm: 74Paced  BP:  Supine:   Sitting: 111/48  Standing:    SaO2: 90%RA  MODE:  Ambulation: 300 ft   POST:  Rate/Rhythem: 84Paced  BP:  Supine:   Sitting: 95/50  Standing:    SaO2: 87-89%2L  To 3L  93% in room after walk 1410-1500 Pt very dyspneic during walk. Hard to get sats to register. Pt took many rest breaks due to SOB. Started on 2L of oxygen but to 3L due to low sats. Tired  By end of walk. Left on 2L. To recliner with call bell.  Duanne Limerick

## 2011-03-06 NOTE — Progress Notes (Signed)
UR Completed.  Donielle Kaigler Jane [phone] 03/06/2011  

## 2011-03-06 NOTE — Progress Notes (Signed)
Physical Therapy Treatment Patient Details Name: Nathaniel Steele MRN: [id number] DOB: 04-18-1926 Today's Date: 03/06/2011  PT Assessment/Plan  PT - Assessment/Plan Comments on Treatment Session: More difficulty getting out of chair today secondary to lower extremity weakness. This also affects his balance when pt not using RW. Pt stubbornly taking his nasal canula off his nose when we got done although his sats would drop to high 80s because he didn't like it.  PT Plan: Discharge plan remains appropriate;Frequency remains appropriate Recommendations for Other Services: OT consult Follow Up Recommendations: Skilled nursing facility;Inpatient Rehab Equipment Recommended: Defer to next venue PT Goals  Acute Rehab PT Goals PT Goal: Sit to Stand - Progress: Progressing toward goal PT Goal: Stand to Sit - Progress: Progressing toward goal PT Transfer Goal: Bed to Chair/Chair to Bed - Progress: Progressing toward goal PT Goal: Ambulate - Progress: Progressing toward goal PT Goal: Perform Home Exercise Program - Progress: Progressing toward goal  PT Treatment Precautions/Restrictions  Precautions Precautions: Fall Precaution Comments: Nathaniel Steele is very independently minded with poor self awareness/monitoring during function activity with regards to his breathing remaining controlled Restrictions Weight Bearing Restrictions: No Mobility (including Balance) Bed Mobility Bed Mobility: No Transfers Sit to Stand: 3: Mod assist;From chair/3-in-1;Without upper extremity assist Sit to Stand Details (indicate cue type and reason): cues for safe hand placement as well as facilitation for follow through and anterior translation Stand to Sit: 4: Min assist Stand to Sit Details: cues for safety Ambulation/Gait Ambulation/Gait Assistance: 4: Min assist Ambulation/Gait Assistance Details (indicate cue type and reason): 60 ft with RW; cues for controlled breathing and safety with RW as well as self  monitoring/taking rest breaks as needed (difficulty getting a good reading with pulse ox, tried three different machines and all of them ranged from 70-low 90s on 4 L, RN notified and ambulation terminated as I do believe he is dropping his sats with activity. Demonstrates decrease self awareness of exertion, pt also breaths through his mouth with very shallow breaths despite cueing); pt continues to be slightly impulsive wanting to push through regardless of my concerns Ambulation Distance (Feet): 60 Feet Assistive device: Rolling walker Gait Pattern: Trunk flexed;Shuffle    Exercise  General Exercises - Lower Extremity Ankle Circles/Pumps: AROM;Both;10 reps;Seated Long Arc Quad: AROM;Both;10 reps;Seated End of Session PT - End of Session Equipment Utilized During Treatment: Gait belt Activity Tolerance: Treatment limited secondary to medical complications (Comment);Patient tolerated treatment well (difficulty getting a good reading of SaO2) Nurse Communication: Mobility status for transfers;Mobility status for ambulation (pts resp status) General Behavior During Session: Nathaniel Steele for tasks performed Cognition: Nathaniel Steele for tasks performed  Nathaniel Steele Nathaniel Steele 03/06/2011, 3:33 PM

## 2011-03-06 NOTE — Progress Notes (Signed)
Subjective: No specific complaints  Objective: Vital signs in last 24 hours: Temp:  [97.3 F (36.3 C)-99.5 F (37.5 C)] 98.6 F (37 C) (02/14 0500) Pulse Rate:  [71-97] 83  (02/14 0500) Resp:  [17-32] 18  (02/14 0500) BP: (93-136)/(57-74) 130/66 mmHg (02/14 0500) SpO2:  [90 %-98 %] 90 % (02/14 0500) Weight change:  Last BM Date: 03/02/11 Intake/Output from previous day:-1205 02/13 0701 - 02/14 0700 In: 1100 [P.O.:720; I.V.:30; Blood:350] Out: 600 [Urine:600] Intake/Output this shift:    PE: General:alert, no cp or sob at rest No JV Lungs:decreased BS bases L>R RRR I/6 SEM Abd: BS + Ext: no sig edema    Lab Results:  Basename 03/06/11 0551 03/05/11 0350  WBC 6.4 5.9  HGB 8.6* 7.8*  HCT 26.4* 24.2*  PLT 106* 98*   BMET  Basename 03/06/11 0551 03/05/11 0350  NA 136 136  K 4.0 3.9  CL 104 103  CO2 25 26  GLUCOSE 97 102*  BUN 50* 49*  CREATININE 2.55* 2.72*  CALCIUM 9.5 9.5   No results found for this basename: TROPONINI:2,CK,MB:2 in the last 72 hours  Lab Results  Component Value Date   CHOL  Value: 95        ATP III CLASSIFICATION:  <200     mg/dL   Desirable  161-096  mg/dL   Borderline High  >=045    mg/dL   High 4/0/9811   HDL 35* 06/29/2006   LDLCALC  Value: 50        Total Cholesterol/HDL:CHD Risk Coronary Heart Disease Risk Table                     Men   Women  1/2 Average Risk   3.4   3.3 06/29/2006   TRIG 52 06/29/2006   CHOLHDL 2.7 06/29/2006   Lab Results  Component Value Date   HGBA1C 5.8* 02/20/2011       Hepatic Function Panel No results found for this basename: PROT,ALBUMIN,AST,ALT,ALKPHOS,BILITOT,BILIDIR,IBILI in the last 72 hours No results found for this basename: CHOL in the last 72 hours No results found for this basename: PROTIME in the last 72 hours    EKG: Orders placed during the hospital encounter of 02/24/11  . EKG 12-LEAD  . EKG 12-LEAD    Studies/Results: Dg Chest 2 View  03/06/2011  *RADIOLOGY REPORT*  Clinical Data:  Recent open-heart surgery.  CHEST - 2 VIEW  Comparison: 03/03/2011 and 02/19/2011.  Findings: Trachea is midline.  Heart is enlarged, stable. Thoracic aorta is calcified.  Left subclavian central line tip projects over the SVC.  Pacemaker lead tips project over the right atrium and right ventricle.  There is increasing loculated pleural fluid in the apicolateral upper left hemithorax. A new locule of pleural air is seen at the apex of the left hemithorax. Added density along the lateral margin of the transverse and proximal descending thoracic aorta corresponds to loculated posterior left pleural fluid on the lateral view, as before.  Small amount of  pleural fluid is seen in the left costophrenic angle.  Mild interstitial prominence in the left lung with mild bibasilar air space disease.  Lower thoracic or upper lumbar compression fracture again noted.  IMPRESSION:  1.  Enlarging loculated left pleural effusion, now with a small locule of pleural air at the left apex. 2.  Mild bibasilar air space disease.  Original Report Authenticated By: Reyes Ivan, M.D.    Medications: I have reviewed the patient's current  medications.    Marland Kitchen amiodarone  200 mg Oral Daily  . bisacodyl  10 mg Oral Daily   Or  . bisacodyl  10 mg Rectal Daily  . calcitRIOL  0.25 mcg Oral Daily  . darbepoetin (ARANESP) injection - NON-DIALYSIS  150 mcg Subcutaneous Q Fri-1800  . docusate sodium  200 mg Oral Daily  . finasteride  5 mg Oral Daily  . furosemide  80 mg Oral Daily  . metoprolol tartrate  25 mg Oral BID  . moving right along book   Does not apply Once  . pantoprazole  40 mg Oral Q1200  . polysaccharide iron  150 mg Oral Daily  . rOPINIRole  0.25 mg Oral TID  . sodium chloride  10 mL Intravenous Q12H  . sodium chloride  3 mL Intravenous Q12H  . warfarin  2.5 mg Oral q1800  . DISCONTD: insulin aspart  0-15 Units Subcutaneous TID WC  . DISCONTD: insulin aspart  0-5 Units Subcutaneous QHS  . DISCONTD: metoprolol  tartrate  12.5 mg Per Tube BID  . DISCONTD: metoprolol tartrate  12.5 mg Oral BID   Assessment/Plan: Patient Active Problem List  Diagnoses  . Chronic renal failure, pt has AVF LUE  . SLE (systemic lupus erythematosus)  . 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 Severe AS, tissue AVR this admission  . PAF (paroxysmal atrial fibrillation), on Amiodarone prior to admission  . Pacemaker, MDT MDT implanted 08/06  . Chronic anticoagulation, on Coumadin prior to admission  . Anemia, post op, Hgb 7.9  . Respiratory failure, hypoxia post op, on bipap currently   PLAN: A. Fib and v. Pacing.  INR 1.18 on coumadin  rec'd 2.5 mg last pm.  10 Days Post-Op Procedure(s) (LRB):  AORTIC VALVE REPLACEMENT (AVR) (N/A)  CORONARY ARTERY BYPASS GRAFTING (CABG) (N/A)  Anemia sl. Improved on aranesp and Niferex.  Thrombocytopenia. Slow decrease of cr.  LOS: 10 days   INGOLD,LAURA R 03/06/2011, 8:50 AM   Patient seen and examined. Agree with assessment and plan. Paced rhythm with underlying AF. L pleural effusion increased on CXR. Continue diuresis. Cr slightly better today. Ambulate as tolerated.   Lennette Bihari, MD, [facility] 03/06/2011 9:09 AM

## 2011-03-07 LAB — PROTIME-INR: Prothrombin Time: 14.9 seconds (ref 11.6–15.2)

## 2011-03-07 MED ORDER — WARFARIN SODIUM 5 MG PO TABS
5.0000 mg | ORAL_TABLET | Freq: Once | ORAL | Status: AC
  Administered 2011-03-07: 5 mg via ORAL
  Filled 2011-03-07: qty 1

## 2011-03-07 MED ORDER — WARFARIN SODIUM 5 MG PO TABS: 2.5000 mg | ORAL_TABLET | Freq: Every day | ORAL | Status: DC

## 2011-03-07 MED ORDER — POLYSACCHARIDE IRON 150 MG PO CAPS: 150.0000 mg | ORAL_CAPSULE | Freq: Every day | ORAL | Status: DC

## 2011-03-07 MED ORDER — WARFARIN SODIUM 2.5 MG PO TABS
2.5000 mg | ORAL_TABLET | Freq: Every day | ORAL | Status: DC
  Administered 2011-03-08: 2.5 mg via ORAL
  Filled 2011-03-07 (×3): qty 1

## 2011-03-07 MED ORDER — METOPROLOL TARTRATE 25 MG PO TABS: 25.0000 mg | ORAL_TABLET | Freq: Two times a day (BID) | ORAL | Status: DC

## 2011-03-07 MED ORDER — SORBITOL 70 % PO SOLN
15.0000 mL | Freq: Every day | ORAL | Status: DC | PRN
  Administered 2011-03-07: 15 mL via ORAL
  Filled 2011-03-07: qty 15

## 2011-03-07 NOTE — Progress Notes (Addendum)
11 Days Post-Op Procedure(s) (LRB): AORTIC VALVE REPLACEMENT (AVR) (N/A) CORONARY ARTERY BYPASS GRAFTING (CABG) (N/A)  Subjective: Patient already ate breakfast, although not very hungry.Has not had a bowel movement in several days. Will try Sorbitol.  Objective: Vital signs in last 24 hours: Patient Vitals for the past 24 hrs:  BP Temp Temp src Pulse Resp SpO2 Weight  03/07/11 0517 - - - - - - 177 lb 12.8 oz (80.65 kg)  03/07/11 0356 131/68 mmHg 98.7 F (37.1 C) Oral 70  18  90 % -  03/06/11 2030 113/66 mmHg 98.5 F (36.9 C) Oral 91  18  93 % -  03/06/11 1333 105/57 mmHg 98.1 F (36.7 C) Oral 72  18  93 % -  03/06/11 1157 - - - - - 85 % -   Pre op weight  76.7 kg Current Weight  03/07/11 177 lb 12.8 oz (80.65 kg)      Intake/Output from previous day: 02/14 0701 - 02/15 0700 In: 720 [P.O.:720] Out: 2725 [Urine:2725]   Physical Exam:  Cardiovascular: IRR,IRR;V paced on tele. Pulmonary: Clear to auscultation bilaterally; no rales, wheezes, or rhonchi. Abdomen: Soft, non tender, bowel sounds present. Extremities: Mild bilateral lower extremity edema.Left AV fistula with bruit and thrill. Wounds: Clean and dry.  No erythema or signs of infection.  Lab Results: CBC:  Basename 03/06/11 0551 03/05/11 0350  WBC 6.4 5.9  HGB 8.6* 7.8*  HCT 26.4* 24.2*  PLT 106* 98*   BMET:   Basename 03/06/11 0551 03/05/11 0350  NA 136 136  K 4.0 3.9  CL 104 103  CO2 25 26  GLUCOSE 97 102*  BUN 50* 49*  CREATININE 2.55* 2.72*  CALCIUM 9.5 9.5    PT/INR:   Basename 03/07/11 0630  LABPROT 14.9  INR 1.15   ABG:  INR: Will add last result for INR, ABG once components are confirmed Will add last 4 CBG results once components are confirmed  Assessment/Plan:  1. CV - PAfib with CVR (patient with a previous history) and V paced. Continue Amiodarone 200 daily, Lopressor 25 bid, and Coumadin. 2.  Pulmonary - Encourage incentive spirometer. Wean O2 as tolerates.He does have a  history of COPD so may need home O2. 3. Volume Overload - On Lasix 80 daily. 4.  Acute blood loss anemia - H/H slightly increased to 8.6/26.4.Previously transfused. Continue Ni ferex and Aranesp. 5.CKD (stage IV)-has left AV fistula. Has not been started on dialysis yet. Last creatinine  down to 2.55.Per nephrology. 7.Thrombocytopenia-Platelets increased to 106,000.  Hayly Litsey MPA-C 03/07/2011

## 2011-03-07 NOTE — Progress Notes (Addendum)
PT treatment note  03/07/11 1224  PT Visit Information  Last PT Received On 03/07/11  Precautions  Precautions Sternal;Fall  Restrictions  Weight Bearing Restrictions No  Transfers  Sit to Stand 4: Min assist (from chair with cues not to use UEs)  Stand to Sit 4: Min assist (with cues not to use UEs and to sit slowly)  Ambulation/Gait  Ambulation/Gait Assistance 4: Min assist  Ambulation/Gait Assistance Details (indicate cue type and reason) Pt more cooperative today.  Pt instructed not to talk while walking and he had to practice his breathing as it doesnt come natural for him to breath through his nose or with activity.  Pt took standing rest breaks every 20 feet to catch his breath and cued to breath throughout walk  Ambulation Distance (Feet) 170 Feet  Assistive device Rolling walker (and 3L O2)  PT - End of Session  Equipment Utilized During Treatment Gait belt  Activity Tolerance Patient tolerated treatment well  Patient left in chair;with call bell in reach;with family/visitor present  Nurse Communication Mobility status for ambulation  General  Behavior During Session [facility] for tasks performed  Cognition [facility] for tasks performed  PT - Assessment/Plan  Comments on Treatment Session Pt more cooperative today and tolerated a longer walk with max cues and frequent stops to catch his breath.  pt walked on 3L o2 and still was 88% at end of walk.  Pt not feeling tired.  He was pleased with his progress today  PT Plan Discharge plan remains appropriate;Frequency remains appropriate  Recommendations for Other Services OT consult (for energy consevation technques)  Follow Up Recommendations Skilled nursing facility;Supervision/Assistance - 24 hour  Equipment Recommended Defer to next venue  Acute Rehab PT Goals  PT Goal: Sit to Stand - Progress Progressing toward goal  PT Goal: Stand to Sit - Progress Progressing toward goal  PT Goal: Ambulate - Progress Progressing toward goal  PT  Goal: Perform Home Exercise Program - Progress Progressing toward goal   03/07/2011   Ranae Palms, PT

## 2011-03-07 NOTE — Progress Notes (Signed)
   CARE MANAGEMENT NOTE 03/07/2011  Patient:  Nathaniel Steele, Nathaniel Steele   Account Number:  [id number]  Date Initiated:  02/25/2011  Documentation initiated by:  [facility]  Subjective/Objective Assessment:   Admitted post op CABG x2 andAVR.     Action/Plan:   PTA, PT INDEPENDENT, LIVES WITH SPOUSE.   Anticipated DC Date:  03/03/2011   Anticipated DC Plan:  HOME W HOME HEALTH SERVICES  In-house referral  Clinical Social Worker      DC Planning Services  CM consult      Choice offered to / List presented to:             Status of service:  In process, will continue to follow Medicare Important Message given?   (If response is "NO", the following Medicare IM given date fields will be blank) Date Medicare IM given:   Date Additional Medicare IM given:    Discharge Disposition:    Per UR Regulation:  Reviewed for med. necessity/level of care/duration of stay  Comments:  03/07/11 Nathaniel Howells,RN,BSN 1445 MET WITH PT AND SPOUSE TO DISCUSS DC PLANS.  ORIGINALLY HAD BEEN FOLLOWING FOR SNF; CHANGED TO HOME WITH HOME HEALTH CARE.  PT AND WIFE STATE THAT MD STATES PT NEEDS REHAB PRIOR TO RETURNING HOME.  WILL HAVE CSW REVISIT THIS ISSUE WITH PT AND SPOUSE.  CSW CONSULTED. Phone #[phone]   02-28-11 11am Nathaniel Steele, RNBSN - [phone] Post op Afib. on IV amio.

## 2011-03-07 NOTE — Progress Notes (Signed)
CARDIAC REHAB PHASE I   PRE:  Rate/Rhythm: Paced 73  BP:  Supine:   Sitting: 103/58  Standing:    SaO2: 94%2L  MODE:  Ambulation: 350 ft   POST:  Rate/Rhythem: 78 Paced  BP:  Supine:   Sitting: 126/68  Standing:    SaO2: 92-93%3l in room   Could not get sats to register in hallway 1325-1356 Pt walked 350 ft on 3L oxygen with rolling walker and asst x 1.  Took several standing rest stops and worked on breathing. Could not get sats to register in hall. To bathroom and then recliner after walk. Encouraged third walk with staff later. Call bell in reach.  Duanne Limerick

## 2011-03-07 NOTE — Discharge Instructions (Signed)
Activity: 1.May walk up steps                2.No lifting more than ten pounds for four weeks.                 3.No driving for four weeks.                4.Stop any activity that causes chest pain, shortness of breath, dizziness,  sweating or excessive weakness.                5.Avoid straining.                6.Continue with your breathing exercises daily.  Diet: Low fat, Low salt, low sugar, and Renal diet  Wound Care: May shower.  Clean wounds with mild soap and water daily. Contact the office at [phone] if any problems arise. Coronary Artery Bypass Grafting Care After Refer to this sheet in the next few weeks. These instructions provide you with information on caring for yourself after your procedure. Your caregiver may also give you more specific instructions. Your treatment has been planned according to current medical practices, but problems sometimes occur. Call your caregiver if you have any problems or questions after your procedure.  Recovery from open heart surgery will be different for everyone. Some people feel well after 3 or 4 weeks, while for others it takes longer. After heart surgery, it may be normal to:  Not have an appetite, feel nauseated by the smell of food, or only want to eat a small amount.   Be constipated because of changes in your diet, activity, and medicines. Eat foods high in fiber. Add fresh fruits and vegetables to your diet. Stool softeners may be helpful.   Feel sad or unhappy. You may be frustrated or cranky. You may have good days and bad days. Do not give up. Talk to your caregiver if you do not feel better.   Feel weakness and fatigue. You many need physical therapy or cardiac rehabilitation to get your strength back.   Develop an irregular heartbeat called atrial fibrillation. Symptoms of atrial fibrillation are a fast, irregular heartbeat or feelings of fluttery heartbeats, shortness of breath, low blood pressure, and dizziness. If these  symptoms develop, see your caregiver right away.  MEDICATION  Have a list of all the medicines you will be taking when you leave the hospital. For every medicine, know the following:   Name.   Exact dose.   Time of day to be taken.   How often it should be taken.   Why you are taking it.   Ask which medicines should or should not be taken together. If you take more than one heart medicine, ask if it is okay to take them together. Some heart medicines should not be taken at the same time because they may lower your blood pressure too much.   Narcotic pain medicine can cause constipation. Eat fresh fruits and vegetables. Add fiber to your diet. Stool softener medicine may help relieve constipation.   Keep a copy of your medicines with you at all times.   Do not add or stop taking any medicine until you check with your caregiver.   Medicines can have side effects. Call your caregiver who prescribed the medicine if you:   Start throwing up, have diarrhea, or have stomach pain.   Feel dizzy or lightheaded when you stand up.   Feel your heart is skipping beats or  is beating too fast or too slow.   Develop a rash.   Notice unusual bruising or bleeding.  HOME CARE INSTRUCTIONS  After heart surgery, it is important to learn how to take your pulse. Have your caregiver show you how to take your pulse.   Use your incentive spirometer. Ask your caregiver how long after surgery you need to use it.  Care of your chest incision  Tell your caregiver right away if you notice clicking in your chest (sternum).   Support your chest with a pillow or your arms when you take deep breaths and cough.   Follow your caregiver's instructions about when you can bathe or swim.   Protect your incision from sunlight during the first year to keep the scar from getting dark.   Tell your caregiver if you notice:   Increased tenderness of your incision.   Increased redness or swelling around your  incision.   Drainage or pus from your incision.  Care of your leg incision(s)  Avoid crossing your legs.   Avoid sitting for long periods of time. Change positions every half hour.   Elevate your leg(s) when you are sitting.   Check your leg(s) daily for swelling. Check the incisions for redness or drainage.   Wear your elastic stockings as told by your caregiver. Take them off at bedtime.  Diet  Diet is very important to heart health.   Eat plenty of fresh fruits and vegetables. Meats should be lean cut. Avoid canned, processed, and fried foods.   Talk to a dietician. They can teach you how to make healthy food and drink choices.  Weight  Weigh yourself every day. This is important because it helps to know if you are retaining fluid that may make your heart and lungs work harder.   Use the same scale each time.   Weigh yourself every morning at the same time. You should do this after you go to the bathroom, but before you eat breakfast.   Your weight will be more accurate if you do not wear any clothes.   Record your weight.   Tell your caregiver if you have gained 2 pounds or more overnight.  Activity Stop any activity at once if you have chest pain, shortness of breath, irregular heartbeats, or dizziness. Get help right away if you have any of these symptoms.  Bathing.  Avoid soaking in a bath or hot tub until your incisions are healed.   Rest. You need a balance of rest and activity.   Exercise. Exercise per your caregiver's advice. You may need physical therapy or cardiac rehabilitation to help strengthen your muscles and build your endurance.   Climbing stairs. Unless your caregiver tells you not to climb stairs, go up stairs slowly and rest if you tire. Do not pull yourself up by the handrail.   Driving a car. Follow your caregiver's advice on when you may drive. You may ride as a passenger at any time. When traveling for long periods of time in a car, get out of  the car and walk around for a few minutes every 2 hours.   Lifting. Avoid lifting, pushing, or pulling anything heavier than 10 pounds for 6 weeks after surgery or as told by your caregiver.   Returning to work. Check with your caregiver. People heal at different rates. Most people will be able to go back to work 6 to 12 weeks after surgery.   Sexual activity. You may resume sexual relations  as told by your caregiver.  SEEK MEDICAL CARE IF:  Any of your incisions are red, painful, or have any type of drainage coming from them.   You have an oral temperature above 102 F (38.9 C).   You have ankle or leg swelling.   You have pain in your legs.   You have weight gain of 2 or more pounds a day.   You feel dizzy or lightheaded when you stand up.  SEEK IMMEDIATE MEDICAL CARE IF:  You have angina or chest pain that goes to your jaw or arms. Call your local emergency services right away.   You have shortness of breath at rest or with activity.   You have a fast or irregular heartbeat (arrhythmia).   There is a "clicking" in your sternum when you move.   You have numbness or weakness in your arms or legs.  MAKE SURE YOU:  Understand these instructions.   Will watch your condition.   Will get help right away if you are not doing well or get worse.  Document Released: 07/26/2004 Document Revised: 09/18/2010 Document Reviewed: 03/13/2010 [facility] Patient Information 2012 [place], [place].  Aortic Valve Replacement Care After Read the instructions outlined below and refer to this sheet for the next few weeks. These discharge instructions provide you with general information on caring for yourself after you leave the hospital. Your surgeon may also give you specific instructions. While your treatment has been planned according to the most current medical practices available, unavoidable complications occasionally occur. If you have any problems or questions after discharge, please call  your surgeon. AFTER THE PROCEDURE  Full recovery from heart valve surgery can take several months.   Blood thinning (anticoagulation) treatment with warfarin is often prescribed for 6 weeks to 3 months after surgery for those with biological valves. It is prescribed for life for those with mechanical valves.   Recovery includes healing of the surgical incision. There is a gradual building of stamina and exercise abilities. An exercise program under the direction of a physical therapist may be recommended.   Once you have an artificial valve, your heart function and your life will return to normal. You usually feel better after surgery. Shortness of breath and fatigue should lessen. If your heart was already severely damaged before your surgery, you may continue to have problems.   You can usually resume most of your normal activities. You will have to continue to monitor your condition. You need to watch out for blood clots and infections.   Artificial valves need to be replaced after a period of time. It is important that you see your caregiver regularly.   Some individuals with an aortic valve replacement need to take antibiotics before having dental work or other surgical procedures. This is called prophylactic antibiotic treatment. These drugs help to prevent infective endocarditis. Antibiotics are only recommended for individuals with the highest risk for developing infective endocarditis. Let your dentist and your caregiver know if you have a history of any of the following so that the necessary precautions can be taken:   A VSD.   A repaired VSD.   Endocarditis in the past.   An artificial (prosthetic) heart valve.  HOME CARE INSTRUCTIONS   Use all medications as prescribed.   Take your temperature every morning for the first week after surgery. Record these.   Weigh yourself every morning for at least the first week after surgery and record.   Do not lift more than 10  pounds  (4.5 kg) until your breastbone (sternum) has healed. Avoid all activities which would place strain on your incision.   You may shower as soon as directed by your caregiver after surgery. Pat incisions dry. Do not rub incisions with washcloth or towel.   Avoid driving for 4 to 6 weeks following surgery or as instructed.   Use your elastic stockings during the day. You should wear the stockings for at least 2 weeks after discharge or longer if your ankles are swollen. The stockings help blood flow and help reduce swelling in the legs. It is easiest to put the stockings on before you get out of bed in the morning. They should fit snugly.  Pain Control  If a prescription was given for a pain reliever, please follow your doctor's directions.   If the pain is not relieved by your medicine, becomes worse, or you have difficulty breathing, call your surgeon.  Activity  Take frequent rest periods throughout the day.   Wait one week before returning to strenuous activities such as heavy lifting (more than 10 pounds), pushing or pulling.   Talk with your doctor about when you may return to work and your exercise routine.   Do not drive while taking prescription pain medication.  Nutrition  You may resume your normal diet.   Drink plenty of fluids (6-8 glasses a day).   Eat a well-balanced diet.   Call your caregiver for persistent nausea or vomiting.  Elimination Your normal bowel function should return. If constipation should occur, you may:  Take a mild laxative.   Add fruit and bran to your diet.   Drink more fluids.   Call your doctor if constipation is not relieved.  SEEK IMMEDIATE MEDICAL CARE IF:   You develop chest pain which is not coming from your surgical cut (incision).   You develop shortness of breath or have difficulty breathing.   You develop a temperature over 101 F (38.3 C).   You have a sudden weight gain. Let your caregiver know what the weight gain is.    You develop a rash.   You develop any reaction or side effects to medications given.   You have increased bleeding from wounds.   You see redness, swelling, or have increasing pain in wounds.   You have pus coming from your wound.   You develop lightheadedness or feel faint.  Document Released: 07/25/2004 Document Revised: 09/18/2010 Document Reviewed: 10/16/2004 [facility] Patient Information 2012 [place], [place].

## 2011-03-07 NOTE — Progress Notes (Signed)
Subjective: No chest pain 11 Days Post-Op Procedure(s) (LRB):  AORTIC VALVE REPLACEMENT (AVR) (N/A)  CORONARY ARTERY BYPASS GRAFTING (CABG) (N/A)  Objective: Vital signs in last 24 hours: Temp:  [98.1 F (36.7 C)-98.7 F (37.1 C)] 98.7 F (37.1 C) (02/15 0356) Pulse Rate:  [70-91] 70  (02/15 0356) Resp:  [18] 18  (02/15 0356) BP: (105-131)/(57-68) 131/68 mmHg (02/15 0356) SpO2:  [85 %-93 %] 90 % (02/15 0356) FiO2 (%):  [4 %] 4 % (02/14 1157) Weight:  [177 lb 12.8 oz (80.65 kg)] 177 lb 12.8 oz (80.65 kg) (02/15 0517) Weight change:  Last BM Date: 03/03/11 Intake/Output from previous day: 02/14 0701 - 02/15 0700 In: 720 [P.O.:720] Out: 2725 [Urine:2725] Intake/Output this shift:    PE: General:A&O X 3, no complaints except constipation Heart:S1S2, RRR  Soft murmur Lungs:clear Abd:+BS, Higher pitched rt. Upper quad Ext:no edema to trace    Lab Results:  Basename 03/06/11 0551 03/05/11 0350  WBC 6.4 5.9  HGB 8.6* 7.8*  HCT 26.4* 24.2*  PLT 106* 98*   BMET  Basename 03/06/11 0551 03/05/11 0350  NA 136 136  K 4.0 3.9  CL 104 103  CO2 25 26  GLUCOSE 97 102*  BUN 50* 49*  CREATININE 2.55* 2.72*  CALCIUM 9.5 9.5   No results found for this basename: TROPONINI:2,CK,MB:2 in the last 72 hours  Lab Results  Component Value Date   CHOL  Value: 95        ATP III CLASSIFICATION:  <200     mg/dL   Desirable  161-096  mg/dL   Borderline High  >=045    mg/dL   High 4/0/9811   HDL 35* 06/29/2006   LDLCALC  Value: 50        Total Cholesterol/HDL:CHD Risk Coronary Heart Disease Risk Table                     Men   Women  1/2 Average Risk   3.4   3.3 06/29/2006   TRIG 52 06/29/2006   CHOLHDL 2.7 06/29/2006   Lab Results  Component Value Date   HGBA1C 5.8* 02/20/2011       Hepatic Function Panel No results found for this basename: PROT,ALBUMIN,AST,ALT,ALKPHOS,BILITOT,BILIDIR,IBILI in the last 72 hours No results found for this basename: CHOL in the last 72 hours No results  found for this basename: PROTIME in the last 72 hours    EKG: Orders placed during the hospital encounter of 02/24/11  . EKG 12-LEAD  . EKG 12-LEAD    Studies/Results: Dg Chest 2 View  03/06/2011  *RADIOLOGY REPORT*  Clinical Data: Recent open-heart surgery.  CHEST - 2 VIEW  Comparison: 03/03/2011 and 02/19/2011.  Findings: Trachea is midline.  Heart is enlarged, stable. Thoracic aorta is calcified.  Left subclavian central line tip projects over the SVC.  Pacemaker lead tips project over the right atrium and right ventricle.  There is increasing loculated pleural fluid in the apicolateral upper left hemithorax. A new locule of pleural air is seen at the apex of the left hemithorax. Added density along the lateral margin of the transverse and proximal descending thoracic aorta corresponds to loculated posterior left pleural fluid on the lateral view, as before.  Small amount of  pleural fluid is seen in the left costophrenic angle.  Mild interstitial prominence in the left lung with mild bibasilar air space disease.  Lower thoracic or upper lumbar compression fracture again noted.  IMPRESSION:  1.  Enlarging loculated  left pleural effusion, now with a small locule of pleural air at the left apex. 2.  Mild bibasilar air space disease.  Original Report Authenticated By: Reyes Ivan, M.D.    Medications: I have reviewed the patient's current medications.    Marland Kitchen amiodarone  200 mg Oral Daily  . bisacodyl  10 mg Oral Daily   Or  . bisacodyl  10 mg Rectal Daily  . calcitRIOL  0.25 mcg Oral Daily  . darbepoetin (ARANESP) injection - NON-DIALYSIS  150 mcg Subcutaneous Q Fri-1800  . docusate sodium  200 mg Oral Daily  . finasteride  5 mg Oral Daily  . furosemide  80 mg Oral Daily  . metoprolol tartrate  25 mg Oral BID  . pantoprazole  40 mg Oral Q1200  . polysaccharide iron  150 mg Oral Daily  . rOPINIRole  0.25 mg Oral TID  . sodium chloride  10 mL Intravenous Q12H  . sodium chloride  3 mL  Intravenous Q12H  . warfarin  2.5 mg Oral q1800   Assessment/Plan: Patient Active Problem List  Diagnoses  . Chronic renal failure, pt has AVF LUE  . SLE (systemic lupus erythematosus)  . 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 Severe AS, tissue AVR this admission  . PAF (paroxysmal atrial fibrillation), on Amiodarone prior to admission  . Pacemaker, MDT MDT implanted 08/06  . Chronic anticoagulation, on Coumadin prior to admission  . Anemia, post op, Hgb 7.9  . Respiratory failure, hypoxia post op, on bipap currently   PLAN: Pl. effusion Improving anemia, on aranesp and niferex. afib and v. Pacing with PPM.  INR 1.15 on coumadin.  Slow decrease of Cr. Thrombocytopenia  Ambulating slowly   LOS: 11 days   INGOLD,LAURA R 03/07/2011, 8:26 AM  Agree with note written by Nada Boozer RNP  Looks good. POD # 11 CABG X 2 and tissue AVR. CAF on coumadin A/C. INR 1.5. Slow progress with ambulation. Pacing. VSS. Exam benign. Mild peripheral edema. Agree with possible OP rehap facility as a bridge to going home. Pt and wife are receptive to this. F/U with Dr. Alanda Amass after D/C.   Runell Gess 03/07/2011   10:54 AM

## 2011-03-07 NOTE — Progress Notes (Signed)
Pt stated he was too tired to take another walk this evening. Otilio Connors RN

## 2011-03-07 NOTE — Progress Notes (Addendum)
CSW signing back on to facilitate SNF placement. Initiated SNF search in [place], per request of pt and pt wife. Will follow to facilitate d/c 2/19 if pt medically ready.  Baxter Flattery, MSW [phone]  Pt has bed offer at [facility], pt stated preference.

## 2011-03-07 NOTE — Discharge Summary (Addendum)
Physician Discharge Summary  Patient ID: Nathaniel Steele MRN: [id number] DOB/AGE: 1926-02-13 78 y.o.  Admit date: 02/24/2011 Discharge date: 03/12/2011  Admission Diagnoses: 1.Multivessel CAD 2.Severe aortic stenosis 3.History of CKD (stage IV;AVF left forearm) 4.History of PAF 5.History of SLE 6.History of benign familial tremor 7.History of hypertension 8.History of secondary hyperparathyroidism 9.History of arthritis 10.History of PPM  Discharge Diagnoses:  1.Multivessel CAD 2.Severe aortic stenosis 3.History of CKD (stage IV;AVF left forearm) 4.History of PAF 5.History of SLE 6.History of benign familial tremor 7.History of hypertension 8.History of secondary hyperparathyroidism 9.History of arthritis 10.History of PPM 11.ABL anemia 12.Respiratory failure (hypoxia post op) 13.Thrombocytopenia (resolved)   Procedure (s):  1. Coronary artery bypass grafting x2 (left internal mammary artery to left anterior descending, saphenous vein graft to distal right coronary artery). 2. Aortic valve replacement for aortic stenosis with a 23-mm pericardial tissue valve [facility] Ease serial [id number]). 3. Placement of left subclavian triple-lumen IV catheter for IV access by Dr. Donata Clay on 02/24/2011.  History of Presenting Illness: The patient is an 78 year old Caucasian gentleman with the aforementioned past medical history and a long-standing progressive aortic stenosis who has been  followed carefully by Dr. Alanda Amass.Recent echo done last month showed now  severe aortic stenosis with a valvular gradient over 40 mmHg.  He has noted a significant decline in his exercise tolerance. A cardiac  Catheterization was done by Dr. Nicholaus Bloom on 02/13/2011. This demonstrated a 90-95% stenosis of the right coronary artery and a 70% tubular stenosis of the LAD. Although the patient has some chronic renal failure and advanced age, he was felt to be  candidate for surgical coronary  revascularization combined with aortic valve replacement. Potential risks, benefits, and complications of the surgery discussed with the patient and he agreed to proceed. Preoperative carotid duplex carotid ultrasound showed no significant internal carotid artery stenosis bilaterally. He was in the to [facility] on 02/24/2011 order to undergo a CABG and aortic valve replacement.   Brief Hospital Course:  She was extubated without difficulty later the  evening of surgery. He then had an episode of emesis and had decreased oxygen saturation to was initially placed on BiPAP. A 2-D echo was obtained on 02/25/2011. Results showed systolic function was normal, the left atrium was massively dilated, and there were no no other abnormalities noted. As previously stated, he had a history of chronic kidney disease. His creatinine on postoperative day #2 went up to 3.3. A consult was obtained with nephrology. He was also ound have acute blood loss anemia. His H&H went as low as 7.3 and 21.8. He did receive 2 units of packed blood cells. Is also placed on Ni ferex and Aranesp. He was also found to have thrombocytopenia postoperatively. His platelet count went down to 69,000. His last platelet count was up to 106,000. He was V  Paced. He was weaned off dopamine. Swan-Ganz, A-line, and Foley were all removed early in his postoperative course. Because he continued to have a fair amount of output, his chest tubes did remain for a couple of days.He was started on low-dose beta blocker. He then went into A. Fib (patient had a history of PAF) with heart into the 100s. He was placed on amiodarone. He was restarted on Coumadin and his PT/INR monitored daily. His dosage and frequency of diuretics was managed by nephrology. His creatinine continued to improve and his last creatinine was down to 2.5 (close to baseline). He continued to progress with cardiac rehabilitation. His pulmonary  status continued to improve as well he was weaned  off of her face mask to nasal cannula. He will require home O2 as he continued to have O2 desaturation despite 2-3 L of oxygen via nasal cannula; however, we will try to wean him to room air as he was not on oxygen prior to surgery. He was felt surgically stable for transfer from the intensive care to [facility] for further convalescence on 03/04/2011. He has already been tolerating a diet and has had previously had bowel movements. Epicardial pacing wires and chest tube sutures have been removed. He has continued to diurese well and his last creatinine was down to 2.56.He remains V paced and is in SR this am.He has been seen and evaluated by Dr. Donata Clay and is felt surgically stable for discharge to the facility when a bed becomes available.  Filed Vitals:   03/12/11 0451  BP: 121/6918  Pulse: 91  Temp: 98.1  Resp: 18     Latest Vital Signs: Please see above.  Physical Exam:  Cardiovascular: IRR,IRR;V paced on tele.  Pulmonary: Clear to auscultation bilaterally; no rales, wheezes, or rhonchi.  Abdomen: Soft, non tender, bowel sounds present.  Extremities: Mild bilateral lower extremity edema.Left AV fistula with bruit and thrill.  Wounds: Clean and dry. No erythema or signs of infection.  Discharge Condition:Stable  Recent laboratory studies:  Lab Results  Component Value Date   WBC 7.3 03/06/2011   HGB 9.1 03/06/2011   HCT 28.2 03/06/2011   MCV 91.3 03/06/2011   PLT 169 03/06/2011   Lab Results  Component Value Date   NA 140 03/06/2011   K 3.7 03/06/2011   CL 106 03/06/2011   CO2 24 03/06/2011   CREATININE 2.56 03/06/2011   GLUCOSE  03/06/2011      Diagnostic Studies: Dg Chest 2 View  03/06/2011  *RADIOLOGY REPORT*  Clinical Data: Recent open-heart surgery.  CHEST - 2 VIEW  Comparison: 03/03/2011 and 02/19/2011.  Findings: Trachea is midline.  Heart is enlarged, stable. Thoracic aorta is calcified.  Left subclavian central line tip projects over the SVC.  Pacemaker lead tips project  over the right atrium and right ventricle.  There is increasing loculated pleural fluid in the apicolateral upper left hemithorax. A new locule of pleural air is seen at the apex of the left hemithorax. Added density along the lateral margin of the transverse and proximal descending thoracic aorta corresponds to loculated posterior left pleural fluid on the lateral view, as before.  Small amount of  pleural fluid is seen in the left costophrenic angle.  Mild interstitial prominence in the left lung with mild bibasilar air space disease.  Lower thoracic or upper lumbar compression fracture again noted.  IMPRESSION:  1.  Enlarging loculated left pleural effusion, now with a small locule of pleural air at the left apex. 2.  Mild bibasilar air space disease.  Original Report Authenticated By: Reyes Ivan, M.D.     Discharge Medications: Medication List  As of 03/07/2011  1:30 PM   STOP taking these medications         amLODipine 10 MG tablet         TAKE these medications         amiodarone 200 MG tablet   Commonly known as: PACERONE   Take 200 mg by mouth daily.      aspirin EC 81 MG tablet   Take 81 mg by mouth daily.      calcitRIOL 0.25 MCG  capsule   Commonly known as: ROCALTROL   Take 0.25 mcg by mouth daily.      finasteride 5 MG tablet   Commonly known as: PROSCAR   Take 5 mg by mouth daily.      furosemide 40 MG tablet   Commonly known as: LASIX   Take 80 mg by mouth daily.      metoprolol tartrate 25 MG tablet   Commonly known as: LOPRESSOR   Take 1 tablet (25 mg total) by mouth 2 (two) times daily.      OCUVITE PRESERVISION PO   Take 1 tablet by mouth daily.      polysaccharide iron 150 MG Caps capsule   Commonly known as: NIFEREX   Take 1 capsule (150 mg total) by mouth daily. For one month then stop.      rOPINIRole 0.25 MG tablet   Commonly known as: REQUIP   Take 0.25 mg by mouth 3 (three) times daily.      warfarin 5 MG tablet   Commonly known as:  COUMADIN   Take 0.5-1 tablets (2.5-5 mg total) by mouth daily. Take 1 tablet on Monday,Wednesday,& Friday  Take 0.5 tablet all other days    Hydrocodone-acetaminophen 5-325 MG per tablet Commonly known ZO:XWRUE Take 1-2 tablets by mouth every six hours as needed for pain.        Follow Up Appointments: Follow-up Information    Follow up with Governor Rooks, MD on 03/24/2011. (@ 11:30 AM)    Contact information:   [address]  [place] [address] [phone]       Follow up with Susa Griffins A, MD. (PT/INR to be drawn on Monday 03/10/2011)    Contact information:   [address]  [place] [address] [phone]       Follow up with Zada Girt, MD. (Call for a follow up appointment for 1-2 weeks)    Contact information:   [address] [employer] [place] [address] [phone]       Follow up with Mikey Bussing, MD. (PA/LAT CXR to be taken on 04/02/2011 at 11:00 am;Appointment  with Dr. Donata Clay is on 04/02/2011 at 12:00 pm)    Contact information:   [address] [place] [address] [phone]       Follow up with Medical Doctor. (Call for a follow up appointment regarding HGA1C 5.8)          Signed: Emilianna Barlowe MPA-C   patient examined and medical record reviewed,agree with above note. VAN TRIGT III,PETER 03/11/2011

## 2011-03-08 ENCOUNTER — Inpatient Hospital Stay (HOSPITAL_COMMUNITY): Payer: Medicare Other

## 2011-03-08 LAB — CBC
HCT: 27.4 % — ABNORMAL LOW (ref 39.0–52.0)
MCHC: 32.5 g/dL (ref 30.0–36.0)
Platelets: 141 10*3/uL — ABNORMAL LOW (ref 150–400)
RDW: 18.7 % — ABNORMAL HIGH (ref 11.5–15.5)
WBC: 8.5 10*3/uL (ref 4.0–10.5)

## 2011-03-08 LAB — BASIC METABOLIC PANEL
Chloride: 104 mEq/L (ref 96–112)
GFR calc Af Amer: 25 mL/min — ABNORMAL LOW (ref >90)
GFR calc non Af Amer: 21 mL/min — ABNORMAL LOW (ref >90)
Potassium: 4.1 mEq/L (ref 3.5–5.1)
Sodium: 138 mEq/L (ref 135–145)

## 2011-03-08 LAB — PROTIME-INR
INR: 1.22 (ref 0.00–1.49)
Prothrombin Time: 15.7 seconds — ABNORMAL HIGH (ref 11.6–15.2)

## 2011-03-08 MED ORDER — FUROSEMIDE 80 MG PO TABS
80.0000 mg | ORAL_TABLET | Freq: Two times a day (BID) | ORAL | Status: DC
  Administered 2011-03-08 – 2011-03-09 (×3): 80 mg via ORAL
  Filled 2011-03-08 (×3): qty 1

## 2011-03-08 MED ORDER — FUROSEMIDE 10 MG/ML IJ SOLN
80.0000 mg | Freq: Once | INTRAMUSCULAR | Status: AC
  Administered 2011-03-08: 80 mg via INTRAVENOUS
  Filled 2011-03-08: qty 8

## 2011-03-08 MED ORDER — FUROSEMIDE 80 MG PO TABS
80.0000 mg | ORAL_TABLET | Freq: Two times a day (BID) | ORAL | Status: DC
  Filled 2011-03-08 (×2): qty 1

## 2011-03-08 NOTE — Progress Notes (Signed)
Subjective:  C/O weakness and DOE  Objective:  Temp:  [96.7 F (35.9 C)-98.8 F (37.1 C)] 98.8 F (37.1 C) (02/16 0428) Pulse Rate:  [70-76] 70  (02/16 0428) Resp:  [18-19] 19  (02/16 0428) BP: (113-126)/(57-75) 113/57 mmHg (02/16 0428) SpO2:  [88 %-94 %] 90 % (02/16 0428) Weight change:   Intake/Output from previous day: 02/15 0701 - 02/16 0700 In: 240 [P.O.:240] Out: 300 [Urine:300]  Intake/Output from this shift:    Physical Exam: General appearance: alert and appears stated age Neck: JVD - 8 cm above sternal notch, no adenopathy, no carotid bruit, no JVD, supple, symmetrical, trachea midline and thyroid not enlarged, symmetric, no tenderness/mass/nodules Lungs: clear to auscultation bilaterally Heart: irregularly irregular rhythm Extremities: extremities normal, atraumatic, no cyanosis or edema  Lab Results: Results for orders placed during the hospital encounter of 02/24/11 (from the past 48 hour(s))  PROTIME-INR     Status: Normal   Collection Time   03/07/11  6:30 AM      Component Value Range Comment   Prothrombin Time 14.9  11.6 - 15.2 (seconds)    INR 1.15  0.00 - 1.49    PROTIME-INR     Status: Abnormal   Collection Time   03/08/11  4:00 AM      Component Value Range Comment   Prothrombin Time 15.7 (*) 11.6 - 15.2 (seconds)    INR 1.22  0.00 - 1.49    CBC     Status: Abnormal   Collection Time   03/08/11  4:00 AM      Component Value Range Comment   WBC 8.5  4.0 - 10.5 (K/uL)    RBC 2.99 (*) 4.22 - 5.81 (MIL/uL)    Hemoglobin 8.9 (*) 13.0 - 17.0 (g/dL)    HCT 62.1 (*) 30.8 - 52.0 (%)    MCV 91.6  78.0 - 100.0 (fL)    MCH 29.8  26.0 - 34.0 (pg)    MCHC 32.5  30.0 - 36.0 (g/dL)    RDW 65.7 (*) 84.6 - 15.5 (%)    Platelets 141 (*) 150 - 400 (K/uL) POST TRANSFUSION SPECIMEN  BASIC METABOLIC PANEL     Status: Abnormal   Collection Time   03/08/11  4:00 AM      Component Value Range Comment   Sodium 138  135 - 145 (mEq/L)    Potassium 4.1  3.5 - 5.1  (mEq/L)    Chloride 104  96 - 112 (mEq/L)    CO2 22  19 - 32 (mEq/L)    Glucose, Bld 102 (*) 70 - 99 (mg/dL)    BUN 63 (*) 6 - 23 (mg/dL)    Creatinine, Ser 9.62 (*) 0.50 - 1.35 (mg/dL)    Calcium 9.8  8.4 - 10.5 (mg/dL)    GFR calc non Af Amer 21 (*) >90 (mL/min)    GFR calc Af Amer 25 (*) >90 (mL/min)     Imaging: Imaging results have been reviewed  Assessment/Plan:   1. Active Problems: 2.  Chronic renal failure, pt has AVF LUE 3.  SLE (systemic lupus erythematosus) 4.  S/P CABG x 2: (LIMA-LAD  SVG - RCA) 5.  Severe AS, tissue AVR this admission 6.  PAF (paroxysmal atrial fibrillation), on Amiodarone prior to admission 7.  Pacemaker, MDT MDT implanted 08/06 8.  Chronic anticoagulation, on Coumadin prior to admission 9.  Anemia, post op, Hgb 7.9 10.  Respiratory failure, hypoxia post op, on bipap currently 11.   Time Spent  Directly with Patient:  20 minutes  Length of Stay:  LOS: 12 days   POD #12 CABG X2 and AVR. Slow progress secondary to fatigue and SOB. CAF with CVR, occasional paced beats. On coumadin A/C with a sub therapeutic INR. Scr 2.6. CXR shows a small Left pleural effusion with an area of superior loculation. Prob not yet ready to go home. Would benefit from OP rehap/SNF  Dwayn Moravek J 03/08/2011, 10:14 AM

## 2011-03-08 NOTE — Progress Notes (Addendum)
                    [address]            [place] [address]          [phone]     12 Days Post-Op Procedure(s) (LRB): AORTIC VALVE REPLACEMENT (AVR) (N/A) CORONARY ARTERY BYPASS GRAFTING (CABG) (N/A)  Subjective: C/o nausea, 1 episode emesis overnight. Still no BM. "Short-winded" this am when getting up and around.  Objective: Vital signs in last 24 hours: Patient Vitals for the past 24 hrs:  BP Temp Temp src Pulse Resp SpO2  03/08/11 0428 113/57 mmHg 98.8 F (37.1 C) Oral 70  19  90 %  03/07/11 2056 119/75 mmHg 98.4 F (36.9 C) Oral 72  18  91 %  03/07/11 1357 126/68 mmHg 96.7 F (35.9 C) Oral 72  18  94 %  03/07/11 1150 - - - 76  - 88 %  03/07/11 1140 - - - 74  - 94 %   Current Weight  03/07/11 80.65 kg (177 lb 12.8 oz)  Pre op weight 76.7 kg    Intake/Output from previous day: 02/15 0701 - 02/16 0700 In: 240 [P.O.:240] Out: 300 [Urine:300]    PHYSICAL EXAM:  Heart: irr irr Lungs: slightly decreased BS in bases Wound: clean and dry Extremities: mild bilat LE edema Abdomen: Soft, NT/ND +BS  Lab Results: CBC: Basename 03/08/11 0400 03/06/11 0551  WBC 8.5 6.4  HGB 8.9* 8.6*  HCT 27.4* 26.4*  PLT 141* 106*   BMET:  Basename 03/08/11 0400 03/06/11 0551  NA 138 136  K 4.1 4.0  CL 104 104  CO2 22 25  GLUCOSE 102* 97  BUN 63* 50*  CREATININE 2.59* 2.55*  CALCIUM 9.8 9.5    PT/INR:  Basename 03/08/11 0400  LABPROT 15.7*  INR 1.22     Assessment/Plan: S/P Procedure(s) (LRB): AORTIC VALVE REPLACEMENT (AVR) (N/A) CORONARY ARTERY BYPASS GRAFTING (CABG) (N/A) CV- Continue Amio, Lopressor, Coumadin. Vol overload- on Lasix.  Wt still up and UOP marginal overnight. Last CXR showed small L effusion with .  Will repeat CXR this am.  May need to add a pm dose of Lasix tonight. GI- LOC today. CRI- Cr stable   LOS: 12 days    COLLINS,GINA H 03/08/2011   I have seen and examined the patient and agree with the assessment and  plan as outlined.  More dyspneic and some rales on exam.  Weights not getting recorded consistently.  Give extra lasix this afternoon.  Claudene Gatliff H 03/08/2011 12:59 PM

## 2011-03-09 LAB — PROTIME-INR
INR: 1.23 (ref 0.00–1.49)
Prothrombin Time: 15.8 seconds — ABNORMAL HIGH (ref 11.6–15.2)

## 2011-03-09 MED ORDER — WARFARIN SODIUM 5 MG PO TABS
5.0000 mg | ORAL_TABLET | Freq: Once | ORAL | Status: AC
  Administered 2011-03-09: 5 mg via ORAL
  Filled 2011-03-09: qty 1

## 2011-03-09 MED ORDER — FUROSEMIDE 10 MG/ML IJ SOLN
80.0000 mg | Freq: Two times a day (BID) | INTRAMUSCULAR | Status: DC
  Administered 2011-03-09 – 2011-03-11 (×5): 80 mg via INTRAVENOUS
  Filled 2011-03-09 (×10): qty 8

## 2011-03-09 NOTE — Progress Notes (Addendum)
                    [address]            [place] [address]          [phone]     13 Days Post-Op Procedure(s) (LRB): AORTIC VALVE REPLACEMENT (AVR) (N/A) CORONARY ARTERY BYPASS GRAFTING (CABG) (N/A)  Subjective: Still SOB with exertion, weak.  Objective: Vital signs in last 24 hours: Patient Vitals for the past 24 hrs:  BP Temp Temp src Pulse Resp SpO2 Weight  03/09/11 0441 140/73 mmHg 97.9 F (36.6 C) Oral 91  - 95 % 80.423 kg (177 lb 4.8 oz)  03/08/11 2043 112/58 mmHg 98.6 F (37 C) Oral 75  19  94 % -  03/08/11 1351 123/72 mmHg 97.8 F (36.6 C) Oral 86  20  91 % -   Current Weight  03/09/11 80.423 kg (177 lb 4.8 oz)  Pre op weight 76.7 kg    Intake/Output from previous day: 02/16 0701 - 02/17 0700 In: 240 [P.O.:240] Out: 400 [Urine:400]    PHYSICAL EXAM:  Heart: irr irr, some paced beats Lungs: crackles in bases,  L>R Wound: clean and dry Extremities: +LE edema  Lab Results: CBC: Basename 03/08/11 0400  WBC 8.5  HGB 8.9*  HCT 27.4*  PLT 141*   BMET:  Basename 03/08/11 0400  NA 138  K 4.1  CL 104  CO2 22  GLUCOSE 102*  BUN 63*  CREATININE 2.59*  CALCIUM 9.8    PT/INR:  Basename 03/09/11 0500  LABPROT 15.8*  INR 1.23     Assessment/Plan: S/P Procedure(s) (LRB): AORTIC VALVE REPLACEMENT (AVR) (N/A) CORONARY ARTERY BYPASS GRAFTING (CABG) (N/A) CV- Continue Amio, Lopressor, Coumadin Vol overload- despite Lasix 80 mg bid yesterday, he still didn't diurese significantly.  Will change Lasix to 80 mg IV bid today and monitor. CRI- Cr stable. INR still not trending up.  Will increase Coumadin today. SNF once medically stable   LOS: 13 days    COLLINS,GINA H 03/09/2011   I have seen and examined the patient and agree with the assessment and plan as outlined.  Cebert Dettmann H 03/09/2011 1:57 PM

## 2011-03-09 NOTE — Progress Notes (Signed)
Pt had diff ambulating without becoming short of breath with every approx 8-10 steps.  o2 sat checked while pt. Was still in progress of walking and was noted to de-sat to 78-80 on 2L/[place].  Wheeled back to room at this point.  Made it approx. 1/2 way thru circle with struggle.  Will cont to monitor.

## 2011-03-10 ENCOUNTER — Inpatient Hospital Stay (HOSPITAL_COMMUNITY): Payer: Medicare Other

## 2011-03-10 LAB — PROTIME-INR
INR: 1.34 (ref 0.00–1.49)
Prothrombin Time: 16.8 seconds — ABNORMAL HIGH (ref 11.6–15.2)

## 2011-03-10 LAB — BASIC METABOLIC PANEL
BUN: 65 mg/dL — ABNORMAL HIGH (ref 6–23)
CO2: 24 mEq/L (ref 19–32)
Calcium: 9.8 mg/dL (ref 8.4–10.5)
Calcium: 9.9 mg/dL (ref 8.4–10.5)
Chloride: 106 mEq/L (ref 96–112)
Creatinine, Ser: 2.76 mg/dL — ABNORMAL HIGH (ref 0.50–1.35)
GFR calc Af Amer: 23 mL/min — ABNORMAL LOW (ref >90)
GFR calc Af Amer: 24 mL/min — ABNORMAL LOW (ref >90)
GFR calc non Af Amer: 20 mL/min — ABNORMAL LOW (ref >90)
GFR calc non Af Amer: 21 mL/min — ABNORMAL LOW (ref >90)
Glucose, Bld: 92 mg/dL (ref 70–99)
Glucose, Bld: 91 mg/dL (ref 70–99)
Potassium: 3.9 mEq/L (ref 3.5–5.1)
Potassium: 3.7 mEq/L (ref 3.5–5.1)
Sodium: 142 mEq/L (ref 135–145)
Sodium: 141 mEq/L (ref 135–145)

## 2011-03-10 LAB — CBC
Hemoglobin: 9.1 g/dL — ABNORMAL LOW (ref 13.0–17.0)
MCH: 29.5 pg (ref 26.0–34.0)
MCHC: 32.3 g/dL (ref 30.0–36.0)
RDW: 18.6 % — ABNORMAL HIGH (ref 11.5–15.5)

## 2011-03-10 MED ORDER — WARFARIN SODIUM 5 MG PO TABS
5.0000 mg | ORAL_TABLET | Freq: Once | ORAL | Status: AC
  Administered 2011-03-10: 5 mg via ORAL
  Filled 2011-03-10: qty 1

## 2011-03-10 NOTE — Progress Notes (Signed)
UR Completed.  Vangie Bicker 03/10/2011 [phone]

## 2011-03-10 NOTE — Progress Notes (Signed)
CARDIAC REHAB PHASE I   PRE:  Rate/Rhythm: 70paced  BP:  Supine:   Sitting: 112/62  Standing:    SaO2: 93%2L  MODE:  Ambulation: 350 ft   POST:  Rate/Rhythem: 84-87  BP:  Supine:   Sitting: 122/70  Standing:    SaO2: 93%4L 1440-1412 Pt walked 350 ft on 4L oxygen and asst x 1 with rolling walker. Took several standing rest stops. Could not get sats to register during walk. To recliner after walk. Call bell in reach. Encouraged pursed-lip breathing.  Duanne Limerick

## 2011-03-10 NOTE — Progress Notes (Addendum)
14 Days Post-Op Procedure(s) (LRB): AORTIC VALVE REPLACEMENT (AVR) (N/A) CORONARY ARTERY BYPASS GRAFTING (CABG) (N/A)  Subjective: Patient visiting with family. Had a bowel movement yesterday.  Objective: Vital signs in last 24 hours: Patient Vitals for the past 24 hrs:  BP Temp Temp src Pulse Resp SpO2 Weight  03/10/11 0415 121/65 mmHg 99.1 F (37.3 C) Oral 74  19  95 % 173 lb 3.2 oz (78.563 kg)  03/09/11 2028 129/68 mmHg 98.7 F (37.1 C) Oral 88  18  94 % -  03/09/11 1408 116/73 mmHg 97.6 F (36.4 C) Oral 91  20  96 % -   Pre op weight  76.7 kg Current Weight  03/10/11 173 lb 3.2 oz (78.563 kg)      Intake/Output from previous day: 02/17 0701 - 02/18 0700 In: 360 [P.O.:360] Out: 900 [Urine:900]   Physical Exam:  Cardiovascular: IRRR, IRRR;no murmurs, gallops, or rubs. Pulmonary: Clear to auscultation bilaterally; no rales, wheezes, or rhonchi. Abdomen: Soft, non tender, bowel sounds present. Extremities: Mild bilateral lower extremity edema. Wounds: Clean and dry.  No erythema or signs of infection.  Lab Results: CBC: Basename 03/10/11 0807 03/08/11 0400  WBC 7.3 8.5  HGB 9.1* 8.9*  HCT 28.2* 27.4*  PLT 169 141*   BMET:  Basename 03/10/11 0807 03/10/11 0612  NA 141 142  K 3.7 3.9  CL 106 106  CO2 23 24  GLUCOSE 91 92  BUN 66* 65*  CREATININE 2.67* 2.76*  CALCIUM 9.9 9.8    PT/INR:  Basename 03/10/11 0807  LABPROT 16.8*  INR 1.34   ABG:  INR: Will add last result for INR, ABG once components are confirmed Will add last 4 CBG results once components are confirmed  Assessment/Plan:  1. CV - Previous paf with CVR. Continues to be V paced. Continue current medications. 2.  Pulmonary - Encourage incentive spirometer.Will likely need O2 via [place] upon discharge. 3. Volume Overload - On Lasix 80 IV bid. Await BNP in am. 4.  Acute blood loss anemia - H/H slightly increased to 9.1/28.2. 5.CKD (stage IV)-Creatinine slightly decreased to  2.67. 6.Thrombocytopenia resolved.   Tanis Hensarling MPA-C 03/10/2011

## 2011-03-10 NOTE — Progress Notes (Signed)
Physical Therapy Treatment Patient Details Name: Nathaniel Steele MRN: [id number] DOB: 07/27/1926 Today's Date: 03/10/2011  PT Assessment/Plan  PT - Assessment/Plan Comments on Treatment Session: Better control of breathing today but on 4 liters to ambulate. RN aware. Pt appears tired at end of session but does not complain. Wife aware of need to practice incentive spirometer.  PT Plan: Discharge plan remains appropriate;Frequency remains appropriate Follow Up Recommendations: Skilled nursing facility;Supervision/Assistance - 24 hour Equipment Recommended: Defer to next venue PT Goals  Acute Rehab PT Goals PT Goal: Sit to Stand - Progress: Progressing toward goal PT Goal: Stand to Sit - Progress: Progressing toward goal PT Transfer Goal: Bed to Chair/Chair to Bed - Progress: Progressing toward goal PT Goal: Ambulate - Progress: Progressing toward goal PT Goal: Perform Home Exercise Program - Progress: Progressing toward goal  PT Treatment Precautions/Restrictions  Precautions Precautions: Sternal;Fall Precaution Comments: [name] is very independently minded with poor self awareness/monitoring during function activity with regards to his breathing remaining controlled Restrictions Weight Bearing Restrictions: No Mobility (including Balance) Bed Mobility Bed Mobility: No Transfers Sit to Stand: 4: Min assist;Without upper extremity assist;From chair/3-in-1 Sit to Stand Details (indicate cue type and reason): cues for safety as well as faciliation for follow through Stand to Sit: 5: Supervision;To chair/3-in-1 Ambulation/Gait Ambulation/Gait Assistance Details (indicate cue type and reason): amb approx 160 ft with mingaurdA, pt still with very flexed posture; pt appearing more calm and listening to cueing better today for more controlled breathing; ambulated initially 70 ft on 3 liters and pt desat to 70s (RN made aware), increased to 3 liters and pt stood for 3 minutes to recover to  92%; then throghout ambulation pt fluctuated between 87-92% on 4 liters needing standing rest break every 20 ft to recover; pt not talking appearing to be concentrating more Ambulation Distance (Feet): 160 Feet Assistive device: Rolling walker Gait Pattern: Trunk flexed    Exercise  General Exercises - Lower Extremity Ankle Circles/Pumps: AROM;10 reps;Standing (with bilateral upper extremity support on RW) Mini-Sqauts: AROM;5 reps;Standing End of Session PT - End of Session Equipment Utilized During Treatment: Gait belt Activity Tolerance: Patient tolerated treatment well Patient left: in chair;with call bell in reach;with family/visitor present Nurse Communication: Mobility status for transfers General Behavior During Session: [facility] for tasks performed Cognition: [facility] for tasks performed  [facility] HELEN 03/10/2011, 1:02 PM

## 2011-03-10 NOTE — Progress Notes (Signed)
Subjective: SOB with exertion  Objective: Vital signs in last 24 hours: Temp:  [97.6 F (36.4 C)-99.1 F (37.3 C)] 99.1 F (37.3 C) (02/18 0415) Pulse Rate:  [74-91] 74  (02/18 0415) Resp:  [18-20] 19  (02/18 0415) BP: (116-129)/(65-73) 121/65 mmHg (02/18 0415) SpO2:  [94 %-96 %] 95 % (02/18 0415) Weight:  [78.563 kg (173 lb 3.2 oz)] 78.563 kg (173 lb 3.2 oz) (02/18 0415) Weight change: -1.86 kg (-4 lb 1.6 oz) Last BM Date: 03/09/11 Intake/Output from previous day: 02/17 0701 - 02/18 0700 In: 360 [P.O.:360] Out: 900 [Urine:900] Intake/Output this shift:    ZO:XWRUEAV:W&U X 3 Heart: Lungs: Abd: Ext:   Lab Results:  Basename 03/10/11 0807 03/08/11 0400  WBC 7.3 8.5  HGB 9.1* 8.9*  HCT 28.2* 27.4*  PLT 169 141*   BMET  Basename 03/10/11 0807 03/10/11 0612  NA 141 142  K 3.7 3.9  CL 106 106  CO2 23 24  GLUCOSE 91 92  BUN 66* 65*  CREATININE 2.67* 2.76*  CALCIUM 9.9 9.8   No results found for this basename: TROPONINI:2,CK,MB:2 in the last 72 hours  Lab Results  Component Value Date   CHOL  Value: 95        ATP III CLASSIFICATION:  <200     mg/dL   Desirable  981-191  mg/dL   Borderline High  >=478    mg/dL   High 03/01/5619   HDL 35* 06/29/2006   LDLCALC  Value: 50        Total Cholesterol/HDL:CHD Risk Coronary Heart Disease Risk Table                     Men   Women  1/2 Average Risk   3.4   3.3 06/29/2006   TRIG 52 06/29/2006   CHOLHDL 2.7 06/29/2006   Lab Results  Component Value Date   HGBA1C 5.8* 02/20/2011       Hepatic Function Panel No results found for this basename: PROT,ALBUMIN,AST,ALT,ALKPHOS,BILITOT,BILIDIR,IBILI in the last 72 hours No results found for this basename: CHOL in the last 72 hours No results found for this basename: PROTIME in the last 72 hours    EKG: Orders placed during the hospital encounter of 02/24/11  . EKG 12-LEAD  . EKG 12-LEAD    Studies/Results: Dg Chest 2 View  03/10/2011  *RADIOLOGY REPORT*  Clinical Data:  78 year old male with shortness of breath, left pleural effusion, CABG.  CHEST - 2 VIEW  Comparison: 03/08/2011 and earlier.  Findings: Left upper lung loculated pleural fluid re-identified and felt to explain the increased left paratracheal density over this series of exams.  Stable sequelae of CABG. Stable cardiomegaly and mediastinal contours.  Sequelae of cardiac valve replacement. Stable right chest cardiac pacemaker.  Stable left subclavian central line.  No pneumothorax identified.  Left lung atelectasis. No definite acute pulmonary edema. Stable visualized osseous structures.  Visualized tracheal air column is within normal limits.  IMPRESSION: 1.  Left upper chest loculated pleural effusion, felt to explain increased left paratracheal density over the series of exams. 2.  Left lung atelectasis.  No pneumothorax. 3.  Stable cardiomegaly.  Original Report Authenticated By: Harley Hallmark, M.D.   Dg Chest Port 1 View  03/08/2011  *RADIOLOGY REPORT*  Clinical Data: Short of breath.  Mid chest pain.  PORTABLE CHEST - 1 VIEW  Comparison: 1 day prior  Findings: Dual lead pacer with leads right atrium right ventricle. Prior median sternotomy.  Left-sided subclavian  line terminates at the high SVC.  Moderate to marked enlargement cardiopericardial silhouette.  Left- sided loculated pleural fluid is again identified.  There is persistent apical component.  The previously described left apical pleural air is no longer identified.  Development of mild interstitial edema, greater left than right. Patchy left lower lobe predominant airspace disease is slightly increased.  There is soft tissue prominence adjacent the transverse aorta.  This is similar to on the prior exam but is not definitely present on 02/24/2011.  IMPRESSION:  1.  Worsened aeration, with developing left greater than right interstitial edema and left base air space disease. 2.  Similar loculated left-sided pleural fluid.  Previously described left  apical pleural air is not appreciated. 3.  Soft tissue fullness about the left side the mediastinum. Especially compared to 02/24/2011, this appears new or progressive. This could represent adjacent left upper lobe airspace disease or a mediastinal process.  If the latter is a concern, contrast enhanced chest CT or CTA should be considered.  These results will be called to the ordering clinician or representative by the Radiologist Assistant, and communication documented in the PACS Dashboard.  Original Report Authenticated By: Consuello Bossier, M.D.    Medications: I have reviewed the patient's current medications.    Marland Kitchen amiodarone  200 mg Oral Daily  . bisacodyl  10 mg Oral Daily   Or  . bisacodyl  10 mg Rectal Daily  . calcitRIOL  0.25 mcg Oral Daily  . darbepoetin (ARANESP) injection - NON-DIALYSIS  150 mcg Subcutaneous Q Fri-1800  . docusate sodium  200 mg Oral Daily  . finasteride  5 mg Oral Daily  . furosemide  80 mg Intravenous BID  . metoprolol tartrate  25 mg Oral BID  . pantoprazole  40 mg Oral Q1200  . polysaccharide iron  150 mg Oral Daily  . rOPINIRole  0.25 mg Oral TID  . sodium chloride  10 mL Intravenous Q12H  . sodium chloride  3 mL Intravenous Q12H  . warfarin  5 mg Oral ONCE-1800   Assessment/Plan: Patient Active Problem List  Diagnoses  . Chronic renal failure, pt has AVF LUE  . SLE (systemic lupus erythematosus)  . 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 Severe AS, tissue AVR this admission  . PAF (paroxysmal atrial fibrillation), on Amiodarone prior to admission  . Pacemaker, MDT MDT implanted 08/06  . Chronic anticoagulation, on Coumadin prior to admission  . Anemia, post op, Hgb 7.9  . Respiratory failure, hypoxia post op, on bipap currently   PLAN:AORTIC VALVE REPLACEMENT (AVR) (N/A)  CORONARY ARTERY BYPASS GRAFTING (CABG) (N/A) 13 days post op Increasing diuretics today.  LOS: 14 days   INGOLD,LAURA R 03/10/2011, 9:53 AM Agree with note written by  Nada Boozer RNP  POD #13 CABG/tissue AVR. Slow to progress. DOE. INR 1.34 on coumadin A/C. Pharmacy managing. He has basilar crackles on exam and 1+ edema.  Lasix increased to 80 mg iv BID. He probably should be on lovenox at least for VTE prophylaxix since he is ASA allergic. SNF for rehab after D/C home. Will check a BNP.  BERRY,JONATHAN J 03/10/2011 10:00 AM

## 2011-03-11 ENCOUNTER — Inpatient Hospital Stay (HOSPITAL_COMMUNITY): Payer: Medicare Other

## 2011-03-11 LAB — PROTIME-INR
INR: 1.44 (ref 0.00–1.49)
Prothrombin Time: 17.8 seconds — ABNORMAL HIGH (ref 11.6–15.2)

## 2011-03-11 LAB — RENAL FUNCTION PANEL
Albumin: 2.8 g/dL — ABNORMAL LOW (ref 3.5–5.2)
BUN: 65 mg/dL — ABNORMAL HIGH (ref 6–23)
Calcium: 9.9 mg/dL (ref 8.4–10.5)
Creatinine, Ser: 2.56 mg/dL — ABNORMAL HIGH (ref 0.50–1.35)
Glucose, Bld: 93 mg/dL (ref 70–99)
Phosphorus: 3.8 mg/dL (ref 2.3–4.6)

## 2011-03-11 MED ORDER — WARFARIN SODIUM 5 MG PO TABS
5.0000 mg | ORAL_TABLET | Freq: Once | ORAL | Status: AC
  Administered 2011-03-11: 5 mg via ORAL
  Filled 2011-03-11: qty 1

## 2011-03-11 MED ORDER — POTASSIUM CHLORIDE CRYS ER 20 MEQ PO TBCR
20.0000 meq | EXTENDED_RELEASE_TABLET | Freq: Every day | ORAL | Status: DC
  Administered 2011-03-11 – 2011-03-12 (×2): 20 meq via ORAL
  Filled 2011-03-11 (×2): qty 1

## 2011-03-11 NOTE — Progress Notes (Signed)
CSW reviewed chart and staffed pt. Will continue to follow to facilitate d/c to SNF as pt progresses.  Baxter Flattery, MSW [phone]

## 2011-03-11 NOTE — Progress Notes (Signed)
CARDIAC REHAB PHASE I   PRE:  Rate/Rhythm: 85Paced  BP:  Supine:   Sitting: 124/70  Standing:    SaO2: 94%2L  MODE:  Ambulation: 350 ft   POST:  Rate/Rhythem: 86  BP:  Supine:   Sitting: 130/70  Standing:    SaO2: 93%4L  sats would not register in hall 1330-1356 Pt walked 350 ft on 4L with rolling walker and asst x 1. Pt stopped frequently to rest due to SOB. Could not get sats to register in hall but 93% on 4L in room. To  Recliner with call bell. Wife in room.  Nathaniel Steele

## 2011-03-11 NOTE — Progress Notes (Addendum)
                    [address]            [place] [address]          [phone]     15 Days Post-Op Procedure(s) (LRB): AORTIC VALVE REPLACEMENT (AVR) (N/A) CORONARY ARTERY BYPASS GRAFTING (CABG) (N/A)  Subjective: Less SOB today, down to 2L O2. No other complaints.  Objective: Vital signs in last 24 hours: Patient Vitals for the past 24 hrs:  BP Temp Temp src Pulse Resp SpO2 Weight  03/11/11 0439 121/72 mmHg 97.8 F (36.6 C) Oral 89  18  90 % 78.881 kg (173 lb 14.4 oz)  03/10/11 2047 130/72 mmHg 98.1 F (36.7 C) Oral 74  18  93 % -  03/10/11 1332 119/68 mmHg 98.4 F (36.9 C) Oral 74  18  93 % -  03/10/11 1048 - - - - - 87 % -   Current Weight  03/11/11 78.881 kg (173 lb 14.4 oz)  Pre op weight 76.7 kg    Intake/Output from previous day: 02/18 0701 - 02/19 0700 In: 240 [P.O.:240] Out: 1425 [Urine:1425]    PHYSICAL EXAM:  Heart: RRR Lungs: slightly decreased in bases, no rales or rhonchi Wound: clean and dry Extremities: mild LE edema  Lab Results: CBC: Basename 03/10/11 0807  WBC 7.3  HGB 9.1*  HCT 28.2*  PLT 169   BMET:  Basename 03/11/11 0515 03/10/11 0807  NA 140 141  K 3.7 3.7  CL 106 106  CO2 24 23  GLUCOSE 93 91  BUN 65* 66*  CREATININE 2.56* 2.67*  CALCIUM 9.9 9.9    PT/INR:  Basename 03/11/11 0515  LABPROT 17.8*  INR 1.44   CXR- stable loculated L effusion  Pro BNP:  13629  Assessment/Plan: S/P Procedure(s) (LRB): AORTIC VALVE REPLACEMENT (AVR) (N/A) CORONARY ARTERY BYPASS GRAFTING (CABG) (N/A) CV-stable, cont current Rx. Pulm- improved.  Cont pulm toilet/IS, wean O2 as able Vol overload- Continue IV Lasix today. Hypokalemia- replace K+ CKD- Cr stable. Disp- hopefully ready for SNF ?later this week if he continues to improve.   LOS: 15 days    COLLINS,GINA H  03/11/2011   I have seen and examined Nathaniel Steele and agree with the above assessment  and plan.  Delight Ovens MD Beeper  [phone] Office [phone] 03/11/2011 3:51 PM

## 2011-03-11 NOTE — Progress Notes (Signed)
Physical Therapy Treatment Patient Details Name: Nathaniel Steele MRN: [id number] DOB: [birth year] Today's Date: 03/11/2011  PT Assessment/Plan  PT - Assessment/Plan Comments on Treatment Session: Better control of breathing today but on 4 liters to ambulate. RN aware. Pt appears tired at end of session but does not complain. Wife aware of need to practice incentive spirometer.  PT Plan: Discharge plan remains appropriate;Frequency remains appropriate Follow Up Recommendations: Skilled nursing facility Equipment Recommended: Defer to next venue PT Goals     PT Treatment Precautions/Restrictions  Precautions Precautions: Sternal Precaution Comments: Nathaniel Steele is very independently minded with poor self awareness/monitoring during function activity with regards to his breathing remaining controlled Required Braces or Orthoses: No Restrictions Weight Bearing Restrictions: No Mobility (including Balance) Bed Mobility Bed Mobility: Yes Supine to Sit: Other (comment) Transfers Sit to Stand: 4: Min assist (assist for follow through and safe hand placement)  Stand to Sit: 5: Supervision (cues for hand placement) Ambulation/Gait Ambulation/Gait: Yes Ambulation/Gait Assistance: 4: Min assist Assistive device: Rolling walker Gait Pattern: Trunk flexed    Exercise  General Exercises - Lower Extremity Ankle Circles/Pumps: 20 reps;Standing (with upper extremity support bilaterally 2 sets of 10) Mini-Sqauts: AROM;5 reps;Standing (x3) End of Session PT - End of Session Equipment Utilized During Treatment: Gait belt Activity Tolerance: Patient tolerated treatment well;Patient limited by fatigue Patient left: in chair;with call bell in reach Nurse Communication: Mobility status for transfers;Mobility status for ambulation General Behavior During Session: [facility] for tasks performed  Nathaniel Steele 03/11/2011, 1:49 PM

## 2011-03-11 NOTE — Progress Notes (Signed)
[facility]  Subjective: DOE.  No CP.  Objective: Vital signs in last 24 hours: Temp:  [97.8 F (36.6 C)-98.4 F (36.9 C)] 97.8 F (36.6 C) (02/19 0439) Pulse Rate:  [74-89] 89  (02/19 0439) Resp:  [18] 18  (02/19 0439) BP: (119-130)/(68-72) 121/72 mmHg (02/19 0439) SpO2:  [87 %-93 %] 90 % (02/19 0439) Weight:  [78.881 kg (173 lb 14.4 oz)] 78.881 kg (173 lb 14.4 oz) (02/19 0439) Last BM Date: 03/09/11  Intake/Output from previous day: 02/18 0701 - 02/19 0700 In: 240 [P.O.:240] Out: 1425 [Urine:1425] Intake/Output this shift: Total I/O In: -  Out: 150 [Urine:150]  Medications Current Facility-Administered Medications  Medication Dose Route Frequency Provider Last Rate Last Dose  . 0.9 %  sodium chloride infusion  250 mL Intravenous PRN Kathlee Nations Trigt III, MD      . albuterol (PROVENTIL HFA;VENTOLIN HFA) 108 (90 BASE) MCG/ACT inhaler 2 puff  2 puff Inhalation Q4H PRN Kathlee Nations Trigt III, MD      . amiodarone (PACERONE) tablet 200 mg  200 mg Oral Daily Mihai Croitoru, MD   200 mg at 03/10/11 1026  . bisacodyl (DULCOLAX) EC tablet 10 mg  10 mg Oral Daily Kathlee Nations Trigt III, MD   10 mg at 03/10/11 1025   Or  . bisacodyl (DULCOLAX) suppository 10 mg  10 mg Rectal Daily Kathlee Nations Trigt III, MD   10 mg at 02/25/11 1559  . calcitRIOL (ROCALTROL) capsule 0.25 mcg  0.25 mcg Oral Daily Zada Girt, MD   0.25 mcg at 03/10/11 1027  . darbepoetin (ARANESP) injection 150 mcg  150 mcg Subcutaneous Q Fri-1800 Zada Girt, MD   150 mcg at 03/07/11 1803  . diphenhydrAMINE (BENADRYL) capsule 25 mg  25 mg Oral QHS PRN Kathlee Nations Trigt III, MD      . docusate sodium (COLACE) capsule 200 mg  200 mg Oral Daily Kathlee Nations Trigt III, MD   200 mg at 03/10/11 1025  . finasteride (PROSCAR) tablet 5 mg  5 mg Oral Daily Kathlee Nations Trigt III, MD   5 mg at 03/10/11 1026  . furosemide (LASIX) injection 80 mg  80 mg Intravenous BID Adella Hare, PA   80 mg at 03/11/11 0820    . HYDROcodone-acetaminophen (NORCO) 5-325 MG per tablet 1-2 tablet  1-2 tablet Oral Q4H PRN Kerin Perna III, MD   1 tablet at 03/09/11 1913  . metoprolol tartrate (LOPRESSOR) tablet 25 mg  25 mg Oral BID Ardelle Balls, PA   25 mg at 03/10/11 2144  . pantoprazole (PROTONIX) EC tablet 40 mg  40 mg Oral Q1200 Kathlee Nations Trigt III, MD   40 mg at 03/10/11 1141  . polysaccharide iron (NIFEREX) capsule 150 mg  150 mg Oral Daily Adella Hare, PA   150 mg at 03/10/11 1026  . rOPINIRole (REQUIP) tablet 0.25 mg  0.25 mg Oral TID Kathlee Nations Trigt III, MD   0.25 mg at 03/10/11 2144  . sodium chloride 0.9 % injection 10 mL  10 mL Intravenous Q12H Kathlee Nations Trigt III, MD   10 mL at 03/09/11 2200  . sodium chloride 0.9 % injection 10-40 mL  10-40 mL Intracatheter PRN Kathlee Nations Trigt III, MD   10 mL at 03/11/11 0515  . sodium chloride 0.9 % injection 3 mL  3 mL Intravenous Q12H Kathlee Nations Trigt III, MD   3 mL at 03/09/11 1030  . sodium  chloride 0.9 % injection 3 mL  3 mL Intravenous PRN Kathlee Nations Trigt III, MD      . sorbitol 70 % solution 15 mL  15 mL Oral Daily PRN Ardelle Balls, PA   15 mL at 03/07/11 1013  . warfarin (COUMADIN) tablet 5 mg  5 mg Oral ONCE-1800 Ardelle Balls, PA   5 mg at 03/10/11 1704    PE: General appearance: alert, cooperative and no distress Lungs: clear to auscultation bilaterally Heart: regular rate and rhythm, 1/6 systolic MM. Extremities: 1-2+ LEE Pulses: Radials 2+ and symmetric.  Left radial pulse vibration. 1+ DPs.  Lab Results:   [facility] 03/10/11 0807  WBC 7.3  HGB 9.1*  HCT 28.2*  PLT 169   BMET  Basename 03/11/11 0515 03/10/11 0807 03/10/11 0612  NA 140 141 142  K 3.7 3.7 3.9  CL 106 106 106  CO2 24 23 24   GLUCOSE 93 91 92  BUN 65* 66* 65*  CREATININE 2.56* 2.67* 2.76*  CALCIUM 9.9 9.9 9.8   PT/INR  Basename 03/11/11 0515 03/10/11 0807 03/09/11 0500  LABPROT 17.8* 16.8* 15.8*  INR 1.44 1.34 1.23    Studies/Results: CHEST - 2  VIEW   Comparison: 03/10/2011   Findings: Left subclavian central line is unchanged.  Dual lead pacer.   Prior median sternotomy.  Small left pleural effusion with loculation superiorly and laterally is similar.  Apical component as well.  Possible medial component, causing left mediastinal soft tissue fullness.   No right pleural effusion. No pneumothorax.  No congestive failure.   Underlying hyperinflation.  Patchy areas of left-sided atelectasis are unchanged.   IMPRESSION:   1. No significant change since one day prior. 2.  Loculated left-sided pleural effusion.  Soft tissue fullness along the left mediastinal border may represent a medial component of loculated pleural fluid .  Technically indeterminate. 3.  Hyperinflation with areas of left-sided atelectasis.  Assessment/Plan  Active Problems:  Chronic renal failure, pt has AVF LUE  SLE (systemic lupus erythematosus)  S/P CABG x 2: (LIMA-LAD  SVG - RCA)  Severe AS, tissue AVR this admission  PAF (paroxysmal atrial fibrillation), on Amiodarone prior to admission  Pacemaker, MDT MDT implanted 08/06  Chronic anticoagulation, on Coumadin prior to admission  Anemia, post op, Hgb 7.9  Respiratory failure, hypoxia post op, on bipap currently  Plan:  POD #15: Coronary artery bypass grafting x2 (LIMA-LAD  SVG - RCA). Aortic valve replacement for aortic stenosis with a 23-mm pericardial tissue valve.  BNP ~13,000.  Diuresing with Net - yesterday on Lasix 80mg  IV twice daily.   Recommend continuing.  BP controlled (119/68 - 130/72).  O2 sats drop to 70's on four liters of O2 during ambulation and rebound to the 90's with rest.    LOS: 15 days    HAGER,BRYAN W 03/11/2011 9:10 AM    Patient seen and examined. Agree with assessment and plan. Continues to slowly improve. Small loculated left pleural effusion on CXR. No sig change. Renal fxn stabilized. Oxygen desaturation with walking.   Lennette Bihari, MD,  [facility] 03/11/2011 10:08 AM

## 2011-03-12 DIAGNOSIS — I3139 Other pericardial effusion (noninflammatory): Secondary | ICD-10-CM

## 2011-03-12 DIAGNOSIS — I313 Pericardial effusion (noninflammatory): Secondary | ICD-10-CM

## 2011-03-12 HISTORY — DX: Other pericardial effusion (noninflammatory): I31.39

## 2011-03-12 HISTORY — DX: Pericardial effusion (noninflammatory): I31.3

## 2011-03-12 LAB — PROTIME-INR: Prothrombin Time: 20.2 seconds — ABNORMAL HIGH (ref 11.6–15.2)

## 2011-03-12 MED ORDER — POLYSACCHARIDE IRON 150 MG PO CAPS: 150.0000 mg | ORAL_CAPSULE | Freq: Every day | ORAL | Status: DC

## 2011-03-12 MED ORDER — HYDROCODONE-ACETAMINOPHEN 5-325 MG PO TABS: 1.0000 | ORAL_TABLET | Freq: Four times a day (QID) | ORAL | Status: AC | PRN

## 2011-03-12 MED ORDER — METOPROLOL TARTRATE 25 MG PO TABS: 25.0000 mg | ORAL_TABLET | Freq: Two times a day (BID) | ORAL | Status: DC

## 2011-03-12 MED ORDER — ACETAMINOPHEN 500 MG PO TABS: 500.0000 mg | ORAL_TABLET | Freq: Four times a day (QID) | ORAL | Status: DC | PRN

## 2011-03-12 MED ORDER — WARFARIN SODIUM 5 MG PO TABS
5.0000 mg | ORAL_TABLET | Freq: Once | ORAL | Status: DC
  Filled 2011-03-12: qty 1

## 2011-03-12 MED ORDER — FUROSEMIDE 80 MG PO TABS: 80.0000 mg | ORAL_TABLET | Freq: Two times a day (BID) | ORAL | Status: DC

## 2011-03-12 MED ORDER — FUROSEMIDE 80 MG PO TABS
80.0000 mg | ORAL_TABLET | Freq: Two times a day (BID) | ORAL | Status: DC
  Administered 2011-03-12: 80 mg via ORAL
  Filled 2011-03-12 (×3): qty 1

## 2011-03-12 NOTE — Progress Notes (Signed)
16 Days Post-Op Procedure(s) (LRB): AORTIC VALVE REPLACEMENT (AVR) (N/A) CORONARY ARTERY BYPASS GRAFTING (CABG) (N/A)  Subjective: Patient without complaints this am.  Objective: Vital signs in last 24 hours: Patient Vitals for the past 24 hrs:  BP Temp Temp src Pulse Resp SpO2 Weight  03/12/11 0451 121/69 mmHg 98.1 F (36.7 C) Oral 91  18  92 % 172 lb 9.6 oz (78.291 kg)  03/11/11 2032 160/77 mmHg 97.8 F (36.6 C) Oral 80  18  94 % -  03/11/11 1309 120/72 mmHg 97.8 F (36.6 C) Oral 82  18  97 % -  03/11/11 1042 112/68 mmHg - - 102  - - -   Pre op weight  76.7 kg Current Weight  03/12/11 172 lb 9.6 oz (78.291 kg)      Intake/Output from previous day: 02/19 0701 - 02/20 0700 In: 600 [P.O.:600] Out: 1250 [Urine:1250]   Physical Exam:  Cardiovascular:RRR;soft systolic murmur;no gallops or rubs. Pulmonary: Clear to auscultation bilaterally; no rales, wheezes, or rhonchi. Abdomen: Soft, non tender, bowel sounds present. Extremities: Mild bilateral lower extremity edema. Wounds: Clean and dry.  No erythema or signs of infection.  Lab Results: CBC:  Basename 03/10/11 0807  WBC 7.3  HGB 9.1*  HCT 28.2*  PLT 169   BMET:   Basename 03/11/11 0515 03/10/11 0807  NA 140 141  K 3.7 3.7  CL 106 106  CO2 24 23  GLUCOSE 93 91  BUN 65* 66*  CREATININE 2.56* 2.67*  CALCIUM 9.9 9.9    PT/INR:   Basename 03/12/11 0620  LABPROT 20.2*  INR 1.69*   ABG:  INR: Will add last result for INR, ABG once components are confirmed Will add last 4 CBG results once components are confirmed  Assessment/Plan:  1. CV - Previous paf with CVR. Continues to be V paced. Continue current medications. 2.  Pulmonary - Encourage incentive spirometer.Will  need O2 via [place] upon discharge. 3. Volume Overload - On Lasix 80 IV bid.Will change to PO bid for 5 days then resume 80 daily upon discharge. 4.  Acute blood loss anemia - H/H slightly increased to 9.1/28.2.Continue Aranesp. 5.CKD  (stage IV)-Creatinine slightly decreased to 2.56. 6.Thrombocytopenia resolved. 7.To SNF when bed available.   Mkenzie Dotts MPA-C 03/12/2011

## 2011-03-12 NOTE — Progress Notes (Signed)
Pt d/c to [facility] this afternoon. CSW advised pt, pt family member, and Char[employer] fundraiser of d/c plan.  [facility] to transport pt. Placed packet in [place]. CSW signing off.  Baxter Flattery, MSW [phone]

## 2011-03-12 NOTE — Progress Notes (Signed)
CARDIAC REHAB PHASE I   PRE:  Rate/Rhythm: 84paced  BP:  Supine:   Sitting: 110/70  Standing:    SaO2: 93%2L  MODE:  Ambulation: 350 ft   POST:  Rate/Rhythem: 86  BP:  Supine: 112/60  Sitting:   Standing:    SaO2: 94%4L after walk in room 1122-1156 Pt walked 350 ft on 4L oxygen with rolling walker and asst x 1. Had to take several frequent rest stops. Pt to recliner after walk. Education completed with pt and wife. Permission given to refer to [facility]. Louvenia Golomb DunlapRN  Duanne Limerick

## 2011-03-12 NOTE — Progress Notes (Signed)
*  PRELIMINARY RESULTS* Echocardiogram 2D Echocardiogram has been performed.  Nathaniel Steele 03/12/2011, 10:53 AM

## 2011-03-14 NOTE — Progress Notes (Signed)
   CARE MANAGEMENT NOTE 03/14/2011  Patient:  Nathaniel Steele, Nathaniel Steele   Account Number:  [id number]  Date Initiated:  02/25/2011  Documentation initiated by:  [facility]  Subjective/Objective Assessment:   Admitted post op CABG x2 andAVR.     Action/Plan:   PTA, PT INDEPENDENT, LIVES WITH SPOUSE.   Anticipated DC Date:  03/03/2011   Anticipated DC Plan:  HOME W HOME HEALTH SERVICES  In-house referral  Clinical Social Worker      DC Planning Services  CM consult      Choice offered to / List presented to:             Status of service:  Completed, signed off Medicare Important Message given?   (If response is "NO", the following Medicare IM given date fields will be blank) Date Medicare IM given:   Date Additional Medicare IM given:    Discharge Disposition:  SKILLED NURSING FACILITY  Per UR Regulation:  Reviewed for med. necessity/level of care/duration of stay  Comments:  03/12/11 Nkenge Sonntag,RN,BSN PT DISCHARGED TO SNF TODAY PER CSW ARRANGEMENTS. Phone #[phone]   03/07/11 Chirstopher Iovino,RN,BSN 1445 MET WITH PT AND SPOUSE TO DISCUSS DC PLANS.  ORIGINALLY HAD BEEN FOLLOWING FOR SNF; CHANGED TO HOME WITH HOME HEALTH CARE.  PT AND WIFE STATE THAT MD STATES PT NEEDS REHAB PRIOR TO RETURNING HOME.  WILL HAVE CSW REVISIT THIS ISSUE WITH PT AND SPOUSE.  CSW CONSULTED. Phone #[phone]  02-28-11 11am [name], RNBSN - [phone] Post op Afib. on IV amio.

## 2011-03-24 NOTE — Discharge Summary (Signed)
patient examined and medical record reviewed,agree with above note. VAN TRIGT III,Kennet Mccort 03/24/2011    

## 2011-03-28 ENCOUNTER — Other Ambulatory Visit: Payer: Self-pay | Admitting: Cardiothoracic Surgery

## 2011-03-28 ENCOUNTER — Encounter (HOSPITAL_COMMUNITY)
Admission: RE | Admit: 2011-03-28 | Discharge: 2011-03-28 | Disposition: A | Discharge status: Home / Self Care | Payer: Medicare Other | Source: Ambulatory Visit | Attending: Nephrology | Admitting: Nephrology

## 2011-03-28 DIAGNOSIS — I2510 Atherosclerotic heart disease of native coronary artery without angina pectoris: Secondary | ICD-10-CM

## 2011-03-28 DIAGNOSIS — N184 Chronic kidney disease, stage 4 (severe): Secondary | ICD-10-CM | POA: Insufficient documentation

## 2011-03-28 DIAGNOSIS — D638 Anemia in other chronic diseases classified elsewhere: Secondary | ICD-10-CM | POA: Insufficient documentation

## 2011-03-28 LAB — RENAL FUNCTION PANEL
Albumin: 3 g/dL — ABNORMAL LOW (ref 3.5–5.2)
BUN: 27 mg/dL — ABNORMAL HIGH (ref 6–23)
CO2: 27 mEq/L (ref 19–32)
Chloride: 100 mEq/L (ref 96–112)
Glucose, Bld: 85 mg/dL (ref 70–99)
Potassium: 3.7 mEq/L (ref 3.5–5.1)

## 2011-03-28 LAB — POCT HEMOGLOBIN-HEMACUE: Hemoglobin: 10.2 g/dL — ABNORMAL LOW (ref 13.0–17.0)

## 2011-03-28 MED ORDER — FERUMOXYTOL INJECTION 510 MG/17 ML
INTRAVENOUS | Status: AC
  Administered 2011-03-28: 510 mg via INTRAVENOUS
  Filled 2011-03-28: qty 17

## 2011-03-28 MED ORDER — EPOETIN ALFA 10000 UNIT/ML IJ SOLN
INTRAMUSCULAR | Status: AC
  Filled 2011-03-28: qty 1

## 2011-03-28 MED ORDER — FERUMOXYTOL INJECTION 510 MG/17 ML
510.0000 mg | INTRAVENOUS | Status: DC
  Administered 2011-03-28: 510 mg via INTRAVENOUS

## 2011-03-28 MED ORDER — SODIUM CHLORIDE 0.9 % IV SOLN
INTRAVENOUS | Status: DC
  Administered 2011-03-28: 250 mL via INTRAVENOUS

## 2011-03-28 MED ORDER — EPOETIN ALFA 10000 UNIT/ML IJ SOLN
INTRAMUSCULAR | Status: AC
  Administered 2011-03-28: 10000 [IU] via SUBCUTANEOUS
  Filled 2011-03-28: qty 1

## 2011-03-28 MED ORDER — DIPHENHYDRAMINE HCL 50 MG/ML IJ SOLN
INTRAMUSCULAR | Status: AC
  Filled 2011-03-28: qty 1

## 2011-03-28 MED ORDER — DIPHENHYDRAMINE HCL 50 MG/ML IJ SOLN
25.0000 mg | Freq: Once | INTRAMUSCULAR | Status: AC
  Administered 2011-03-28: 25 mg via INTRAVENOUS

## 2011-03-28 NOTE — Progress Notes (Addendum)
3 minutes after feraheme administration pt complained of low abdominal cramping 5/10 lasting about 5 minutes and then complained of itching of left upper thigh. Approximately 1 x 4 inch area thick red rash noted almost at groin.  Patient states he did not have the rash nor itch when he put his pants on this am at 0730. Dr Eliott Nine paged. 1400 states abdominal cramp has stopped and itching left upper thigh has "cooled down". 1430 orders recieved

## 2011-04-02 ENCOUNTER — Ambulatory Visit (INDEPENDENT_AMBULATORY_CARE_PROVIDER_SITE_OTHER): Payer: Self-pay | Admitting: Physician Assistant

## 2011-04-02 ENCOUNTER — Encounter: Payer: Self-pay | Admitting: Cardiothoracic Surgery

## 2011-04-02 ENCOUNTER — Ambulatory Visit
Admission: RE | Admit: 2011-04-02 | Discharge: 2011-04-02 | Disposition: A | Discharge status: Home / Self Care | Payer: Medicare Other | Source: Ambulatory Visit | Attending: Cardiothoracic Surgery | Admitting: Cardiothoracic Surgery

## 2011-04-02 VITALS — BP 110/66 | HR 72 | Resp 20 | Ht 67.5 in | Wt 158.0 lb

## 2011-04-02 DIAGNOSIS — I359 Nonrheumatic aortic valve disorder, unspecified: Secondary | ICD-10-CM

## 2011-04-02 DIAGNOSIS — Z954 Presence of other heart-valve replacement: Secondary | ICD-10-CM

## 2011-04-02 DIAGNOSIS — I2510 Atherosclerotic heart disease of native coronary artery without angina pectoris: Secondary | ICD-10-CM

## 2011-04-02 DIAGNOSIS — Z951 Presence of aortocoronary bypass graft: Secondary | ICD-10-CM

## 2011-04-02 DIAGNOSIS — Z952 Presence of prosthetic heart valve: Secondary | ICD-10-CM

## 2011-04-02 DIAGNOSIS — I350 Nonrheumatic aortic (valve) stenosis: Secondary | ICD-10-CM

## 2011-04-02 NOTE — Progress Notes (Signed)
  HPI:  Patient returns for routine postoperative follow-up having undergone a CABG x2 and aVR by Dr. Zenaida Niece tray on 02/24/2011. Since hospital discharge the patient reports he did develop some increasing peripheral edema. He denies any chest pain,  shortness of breath, fever, or chills.   Current Outpatient Prescriptions  Medication Sig Dispense Refill  . amiodarone (PACERONE) 200 MG tablet Take 200 mg by mouth daily.      Marland Kitchen aspirin EC 81 MG tablet Take 81 mg by mouth daily.      . calcitRIOL (ROCALTROL) 0.25 MCG capsule Take 0.25 mcg by mouth daily.      . finasteride (PROSCAR) 5 MG tablet Take 5 mg by mouth daily.      . furosemide (LASIX) 80 MG tablet Take 80 mg by mouth 2 (two) times daily.      . metoprolol tartrate (LOPRESSOR) 25 MG tablet Take 1 tablet (25 mg total) by mouth 2 (two) times daily.  60 tablet  1  . Multiple Vitamins-Minerals (OCUVITE PRESERVISION PO) Take 1 tablet by mouth daily.      Marland Kitchen rOPINIRole (REQUIP) 0.25 MG tablet Take 0.25 mg by mouth 3 (three) times daily.      . sodium chloride 0.9 % SOLN 500 mL with iron dextran complex 50 MG/ML SOLN Inject 1,000 mg into the vein once. Prn at Short Stay      . warfarin (COUMADIN) 5 MG tablet Take 0.5-1 tablets (2.5-5 mg total) by mouth daily. Take 1 tablet on Monday,Wednesday,& Friday Take 0.5 tablet all other days      Vital Signs: Blood pressure 110/66, heart rate 72, surgery 20, O2 sat 96% on room air Physical Exam: Cardiovascular: Regular rate and rhythm; no murmurs, gallops, or rub. Pulmonary: Clear to auscultation bilaterally. Abdomen: Soft, nontender, bowel sounds present. Extremeties: Mild lower extremity edema ( right greater than left) Wounds: Clean, dry, no signs of infection. A couple of eschars were removed from former chest tube sites. There is slight bloody ooze from the left chest tube site. There was no purulence. The right lower extremity wound also had an eschar which was removed.  Diagnostic Tests: PA  and lateral chest x-ray done today shows stable cardiomegaly, improved aeration of the left upper lobe, still likely some fluid in the left major fissure, no pneumothorax, and no pleural effusion.   Impression and Plan: Mr. point has seen Dr. Eliott Nine as well as Dr. Alanda Amass in followup. Dr. Eliott Nine has increased his Lasix to 80 mg by mouth 2 times daily. He is on Coumadin and his PT and INR are being monitored by Dr. Alanda Amass the office. His next PT/INR is to be drawn on Thursday, 04/03/2011. He has been no changes made to his medications. He is not taking any narcotics for pain. He was instructed he may begin driving short distances i.e. 30 minutes or less during the day. He may gradually increase his frequency and duration as tolerates the he was instructed continue with sternal precautions i.e. no lifting or than 10 pounds for the next 3-4 weeks. He has not been contacted by cardiac rehabilitation;however, he states that he does have physical therapy that comes to his home. Return to see Dr. Zenaida Niece to right in approximately 3 weeks with a chest x-ray.

## 2011-04-04 ENCOUNTER — Encounter (HOSPITAL_COMMUNITY)
Admission: RE | Admit: 2011-04-04 | Discharge: 2011-04-04 | Disposition: A | Discharge status: Home / Self Care | Payer: Medicare Other | Source: Ambulatory Visit | Attending: Nephrology | Admitting: Nephrology

## 2011-04-04 MED ORDER — SODIUM CHLORIDE 0.9 % IV SOLN
200.0000 mg | Freq: Once | INTRAVENOUS | Status: AC
  Administered 2011-04-04: 200 mg via INTRAVENOUS
  Filled 2011-04-04: qty 10

## 2011-04-23 ENCOUNTER — Ambulatory Visit: Payer: Medicare Other | Admitting: Cardiothoracic Surgery

## 2011-04-24 ENCOUNTER — Other Ambulatory Visit: Payer: Self-pay | Admitting: Cardiothoracic Surgery

## 2011-04-24 DIAGNOSIS — I2510 Atherosclerotic heart disease of native coronary artery without angina pectoris: Secondary | ICD-10-CM

## 2011-04-25 ENCOUNTER — Encounter (HOSPITAL_COMMUNITY)
Admission: RE | Admit: 2011-04-25 | Discharge: 2011-04-25 | Disposition: A | Discharge status: Home / Self Care | Payer: Medicare Other | Source: Ambulatory Visit | Attending: Nephrology | Admitting: Nephrology

## 2011-04-25 DIAGNOSIS — D638 Anemia in other chronic diseases classified elsewhere: Secondary | ICD-10-CM | POA: Insufficient documentation

## 2011-04-25 DIAGNOSIS — N184 Chronic kidney disease, stage 4 (severe): Secondary | ICD-10-CM | POA: Insufficient documentation

## 2011-04-25 MED ORDER — EPOETIN ALFA 10000 UNIT/ML IJ SOLN
INTRAMUSCULAR | Status: AC
  Filled 2011-04-25: qty 1

## 2011-04-25 MED ORDER — EPOETIN ALFA 10000 UNIT/ML IJ SOLN
10000.0000 [IU] | INTRAMUSCULAR | Status: DC
  Administered 2011-04-25: 10000 [IU] via SUBCUTANEOUS

## 2011-04-30 ENCOUNTER — Encounter: Payer: Self-pay | Admitting: Cardiothoracic Surgery

## 2011-04-30 ENCOUNTER — Ambulatory Visit
Admission: RE | Admit: 2011-04-30 | Discharge: 2011-04-30 | Disposition: A | Discharge status: Home / Self Care | Payer: Medicare Other | Source: Ambulatory Visit | Attending: Cardiothoracic Surgery | Admitting: Cardiothoracic Surgery

## 2011-04-30 ENCOUNTER — Ambulatory Visit (INDEPENDENT_AMBULATORY_CARE_PROVIDER_SITE_OTHER): Payer: Self-pay | Admitting: Cardiothoracic Surgery

## 2011-04-30 VITALS — BP 114/68 | HR 68 | Resp 16 | Ht 67.5 in | Wt 160.0 lb

## 2011-04-30 DIAGNOSIS — I2510 Atherosclerotic heart disease of native coronary artery without angina pectoris: Secondary | ICD-10-CM

## 2011-04-30 DIAGNOSIS — Z09 Encounter for follow-up examination after completed treatment for conditions other than malignant neoplasm: Secondary | ICD-10-CM

## 2011-04-30 DIAGNOSIS — I350 Nonrheumatic aortic (valve) stenosis: Secondary | ICD-10-CM

## 2011-04-30 DIAGNOSIS — I359 Nonrheumatic aortic valve disorder, unspecified: Secondary | ICD-10-CM

## 2011-04-30 NOTE — Progress Notes (Signed)
PCP is Kristian Covey, MD, MD Referring Provider is Kristian Covey, MD  Chief Complaint  Patient presents with  . Routine Post Op    3 wk with cxr.Marland KitchenMarland KitchenAVR,CABG 02/24/11                        [address]            [name] [address]          [id number]     The patient is 78 year old nondiabetic returns for final visit 2 months after aVR CABG x2. He had severe aortic stenosis and a 90% stenosis of the right coronary 70% stenosis of the LAD. He went to a skilled nursing facility following discharge is now back at home. He is driving. He is anxious to resume lawn care on his riding mower. He denies any symptoms of angina or CHF. He does have chronic renal insufficiency but his creatinine has returned to baseline approximately 2.5-3.0. Surgical incisions are all well healed.   Past Medical History  Diagnosis Date  . Shortness of breath with exertion  . Chronic kidney disease     not on dialysis yet  . Coronary artery disease   . Hypertension   . Arthritis   . Blood dyscrasia     one time had low platlet count  . Dysrhythmia     atrial fib    Past Surgical History  Procedure Date  . Back surgery   . Appendectomy   . Hernia repair   . Pacemaker insertion     2006  . Av fistula placement   . Aortic valve replacement 02/24/2011    Procedure: AORTIC VALVE REPLACEMENT (AVR);  Surgeon: Kathlee Nations Suann Larry, MD;  Location: [facility] OR;  Service: Open Heart Surgery;  Laterality: N/A;  . Coronary artery bypass graft 02/24/2011    Procedure: CORONARY ARTERY BYPASS GRAFTING (CABG);  Surgeon: Kathlee Nations Suann Larry, MD;  Location: [facility] OR;  Service: Open Heart Surgery;  Laterality: N/A;  Coranary artery bypass times two using left internal mammary artery and right greater saphenous vein    No family history on file.  Social History History  Substance Use Topics  . Smoking status: Former Smoker    Types: Cigarettes    Quit date: 02/11/1957  . Smokeless tobacco: Not on file    . Alcohol Use: No    Current Outpatient Prescriptions  Medication Sig Dispense Refill  . amiodarone (PACERONE) 200 MG tablet Take 200 mg by mouth daily.      Marland Kitchen amLODipine (NORVASC) 5 MG tablet Take 5 mg by mouth daily.      Marland Kitchen aspirin EC 81 MG tablet Take 81 mg by mouth daily.      . calcitRIOL (ROCALTROL) 0.25 MCG capsule Take 0.25 mcg by mouth daily.      . finasteride (PROSCAR) 5 MG tablet Take 5 mg by mouth daily.      . furosemide (LASIX) 80 MG tablet Take 120 mg by mouth 2 (two) times daily.       . metoprolol tartrate (LOPRESSOR) 25 MG tablet Take 1 tablet (25 mg total) by mouth 2 (two) times daily.  60 tablet  1  . Multiple Vitamins-Minerals (OCUVITE PRESERVISION PO) Take 1 tablet by mouth daily.      Marland Kitchen rOPINIRole (REQUIP) 0.25 MG tablet Take 0.25 mg by mouth 3 (three) times daily.      . sodium chloride 0.9 % SOLN 500 mL  with iron dextran complex 50 MG/ML SOLN Inject 1,000 mg into the vein once. Prn at Short Stay      . warfarin (COUMADIN) 5 MG tablet Take 0.5-1 tablets (2.5-5 mg total) by mouth daily. Take 1 tablet on Monday,Wednesday,& Friday Take 0.5 tablet all other days      . DISCONTD: polysaccharide iron (NIFEREX) 150 MG CAPS capsule Take 1 capsule (150 mg total) by mouth daily. For one month then stop.  30 each  0    Allergies  Allergen Reactions  . Aspirin Hives    Patient states that he can take the enteric coated but not the uncoated  . Penicillins Hives  . Feraheme (Ferumoxytol) Rash    Abdominal pain    Review of Systems good appetite, good exercise tolerance, good sleeping no surgical incision problems  BP 114/68  Pulse 68  Resp 16  Ht 5' 7.5" (1.715 m)  Wt 160 lb (72.576 kg)  BMI 24.69 kg/m2  SpO2 98% Physical Exam General alert and pleasant comfortable Lungs clear Sternal incision well-healed Cardiac rhythm regular grade 2 flow murmur through the aortic prosthesis no diastolic murmur Extremity with minimal edema  Diagnostic Tests: Chest x-ray  clear lung fields no pleural effusion sternal wires intact Impression: Stable course 2 months after aVR CABG. He'll continue to slowly progress his activity levels and return as needed.

## 2011-04-30 NOTE — Patient Instructions (Signed)
You may ride a riding mower You may ride a tractor after May 15 He may lift up to 15 pounds now no more than that until after May 15 Return as needed

## 2011-05-20 ENCOUNTER — Other Ambulatory Visit: Payer: Self-pay | Admitting: Dermatology

## 2011-05-23 ENCOUNTER — Encounter (HOSPITAL_COMMUNITY)
Admission: RE | Admit: 2011-05-23 | Discharge: 2011-05-23 | Disposition: A | Discharge status: Home / Self Care | Payer: Medicare Other | Source: Ambulatory Visit | Attending: Nephrology | Admitting: Nephrology

## 2011-05-23 DIAGNOSIS — D638 Anemia in other chronic diseases classified elsewhere: Secondary | ICD-10-CM | POA: Insufficient documentation

## 2011-05-23 DIAGNOSIS — N184 Chronic kidney disease, stage 4 (severe): Secondary | ICD-10-CM | POA: Insufficient documentation

## 2011-05-23 LAB — IRON AND TIBC
Saturation Ratios: 20 % (ref 20–55)
UIBC: 194 ug/dL (ref 125–400)

## 2011-05-23 LAB — POCT HEMOGLOBIN-HEMACUE: Hemoglobin: 11.4 g/dL — ABNORMAL LOW (ref 13.0–17.0)

## 2011-05-23 MED ORDER — EPOETIN ALFA 10000 UNIT/ML IJ SOLN
10000.0000 [IU] | INTRAMUSCULAR | Status: DC
  Administered 2011-05-23: 10000 [IU] via SUBCUTANEOUS

## 2011-05-23 MED ORDER — EPOETIN ALFA 10000 UNIT/ML IJ SOLN
INTRAMUSCULAR | Status: AC
  Filled 2011-05-23: qty 1

## 2011-06-20 ENCOUNTER — Encounter (HOSPITAL_COMMUNITY)
Admission: RE | Admit: 2011-06-20 | Discharge: 2011-06-20 | Disposition: A | Discharge status: Home / Self Care | Payer: Medicare Other | Source: Ambulatory Visit | Attending: Nephrology | Admitting: Nephrology

## 2011-06-20 LAB — IRON AND TIBC: Iron: 47 ug/dL (ref 42–135)

## 2011-06-20 MED ORDER — EPOETIN ALFA 10000 UNIT/ML IJ SOLN
INTRAMUSCULAR | Status: AC
  Administered 2011-06-20: 10000 [IU] via SUBCUTANEOUS
  Filled 2011-06-20: qty 1

## 2011-06-20 MED ORDER — EPOETIN ALFA 10000 UNIT/ML IJ SOLN
10000.0000 [IU] | INTRAMUSCULAR | Status: DC
  Administered 2011-06-20: 10000 [IU] via SUBCUTANEOUS

## 2011-07-17 ENCOUNTER — Other Ambulatory Visit (HOSPITAL_COMMUNITY): Payer: Self-pay | Admitting: *Deleted

## 2011-07-18 ENCOUNTER — Encounter (HOSPITAL_COMMUNITY)
Admission: RE | Admit: 2011-07-18 | Discharge: 2011-07-18 | Disposition: A | Discharge status: Home / Self Care | Payer: Medicare Other | Source: Ambulatory Visit | Attending: Nephrology | Admitting: Nephrology

## 2011-07-18 DIAGNOSIS — N184 Chronic kidney disease, stage 4 (severe): Secondary | ICD-10-CM | POA: Insufficient documentation

## 2011-07-18 DIAGNOSIS — D638 Anemia in other chronic diseases classified elsewhere: Secondary | ICD-10-CM | POA: Insufficient documentation

## 2011-07-18 LAB — IRON AND TIBC

## 2011-07-18 LAB — POCT HEMOGLOBIN-HEMACUE: Hemoglobin: 12.2 g/dL — ABNORMAL LOW (ref 13.0–17.0)

## 2011-07-18 MED ORDER — EPOETIN ALFA 10000 UNIT/ML IJ SOLN: 10000.0000 [IU] | INTRAMUSCULAR | Status: DC

## 2011-07-25 ENCOUNTER — Other Ambulatory Visit (HOSPITAL_COMMUNITY): Payer: Self-pay | Admitting: *Deleted

## 2011-08-01 ENCOUNTER — Encounter (HOSPITAL_COMMUNITY)
Admission: RE | Admit: 2011-08-01 | Discharge: 2011-08-01 | Disposition: A | Discharge status: Home / Self Care | Payer: Medicare Other | Source: Ambulatory Visit | Attending: Nephrology | Admitting: Nephrology

## 2011-08-01 DIAGNOSIS — D638 Anemia in other chronic diseases classified elsewhere: Secondary | ICD-10-CM | POA: Insufficient documentation

## 2011-08-01 DIAGNOSIS — N184 Chronic kidney disease, stage 4 (severe): Secondary | ICD-10-CM | POA: Insufficient documentation

## 2011-08-01 LAB — IRON AND TIBC
Iron: 70 ug/dL (ref 42–135)
TIBC: 256 ug/dL (ref 215–435)
UIBC: 186 ug/dL (ref 125–400)

## 2011-08-01 MED ORDER — EPOETIN ALFA 10000 UNIT/ML IJ SOLN: 5000.0000 [IU] | INTRAMUSCULAR | Status: DC

## 2011-08-01 MED ORDER — SODIUM CHLORIDE 0.9 % IV SOLN
200.0000 mg | Freq: Once | INTRAVENOUS | Status: AC
  Administered 2011-08-01: 200 mg via INTRAVENOUS
  Filled 2011-08-01: qty 10

## 2011-08-12 ENCOUNTER — Other Ambulatory Visit (HOSPITAL_COMMUNITY): Payer: Self-pay | Admitting: *Deleted

## 2011-08-15 ENCOUNTER — Encounter (HOSPITAL_COMMUNITY)
Admission: RE | Admit: 2011-08-15 | Discharge: 2011-08-15 | Disposition: A | Discharge status: Home / Self Care | Payer: Medicare Other | Source: Ambulatory Visit | Attending: Nephrology | Admitting: Nephrology

## 2011-08-15 MED ORDER — EPOETIN ALFA 10000 UNIT/ML IJ SOLN
INTRAMUSCULAR | Status: AC
  Filled 2011-08-15: qty 1

## 2011-08-15 MED ORDER — EPOETIN ALFA 10000 UNIT/ML IJ SOLN
5000.0000 [IU] | INTRAMUSCULAR | Status: DC
  Administered 2011-08-15: 5000 [IU] via SUBCUTANEOUS

## 2011-08-27 DIAGNOSIS — I495 Sick sinus syndrome: Secondary | ICD-10-CM

## 2011-08-27 HISTORY — DX: Sick sinus syndrome: I49.5

## 2011-09-05 ENCOUNTER — Other Ambulatory Visit: Payer: Self-pay | Admitting: Dermatology

## 2011-09-12 ENCOUNTER — Encounter (HOSPITAL_COMMUNITY)
Admission: RE | Admit: 2011-09-12 | Discharge: 2011-09-12 | Disposition: A | Discharge status: Home / Self Care | Payer: Medicare Other | Source: Ambulatory Visit | Attending: Nephrology | Admitting: Nephrology

## 2011-09-12 DIAGNOSIS — N184 Chronic kidney disease, stage 4 (severe): Secondary | ICD-10-CM | POA: Insufficient documentation

## 2011-09-12 DIAGNOSIS — D638 Anemia in other chronic diseases classified elsewhere: Secondary | ICD-10-CM | POA: Insufficient documentation

## 2011-09-12 LAB — FERRITIN: Ferritin: 514 ng/mL — ABNORMAL HIGH (ref 22–322)

## 2011-09-12 LAB — POCT HEMOGLOBIN-HEMACUE: Hemoglobin: 12.1 g/dL — ABNORMAL LOW (ref 13.0–17.0)

## 2011-09-12 MED ORDER — EPOETIN ALFA 10000 UNIT/ML IJ SOLN: 5000.0000 [IU] | INTRAMUSCULAR | Status: DC

## 2011-09-26 ENCOUNTER — Encounter (HOSPITAL_COMMUNITY): Payer: Medicare Other

## 2012-01-23 ENCOUNTER — Other Ambulatory Visit (HOSPITAL_COMMUNITY): Payer: Self-pay | Admitting: Cardiovascular Disease

## 2012-01-23 DIAGNOSIS — I739 Peripheral vascular disease, unspecified: Secondary | ICD-10-CM

## 2012-02-19 ENCOUNTER — Ambulatory Visit (HOSPITAL_COMMUNITY)
Admission: RE | Admit: 2012-02-19 | Discharge: 2012-02-19 | Disposition: A | Discharge status: Home / Self Care | Payer: Medicare Other | Source: Ambulatory Visit | Attending: Cardiovascular Disease | Admitting: Cardiovascular Disease

## 2012-02-19 DIAGNOSIS — I739 Peripheral vascular disease, unspecified: Secondary | ICD-10-CM | POA: Insufficient documentation

## 2012-02-19 HISTORY — DX: Peripheral vascular disease, unspecified: I73.9

## 2012-02-19 NOTE — Progress Notes (Signed)
Bilateral LEA duplex completed. Chauncy Lean

## 2012-02-25 ENCOUNTER — Encounter: Payer: Self-pay | Admitting: Pharmacist Clinician (PhC)/ Clinical Pharmacy Specialist

## 2012-03-11 ENCOUNTER — Other Ambulatory Visit: Payer: Self-pay | Admitting: Orthopedic Surgery

## 2012-03-11 ENCOUNTER — Other Ambulatory Visit (HOSPITAL_COMMUNITY): Payer: Self-pay | Admitting: Cardiovascular Disease

## 2012-03-15 ENCOUNTER — Ambulatory Visit (HOSPITAL_COMMUNITY)
Admission: RE | Admit: 2012-03-15 | Discharge: 2012-03-15 | Disposition: A | Discharge status: Home / Self Care | Payer: Medicare Other | Source: Ambulatory Visit | Attending: Cardiovascular Disease | Admitting: Cardiovascular Disease

## 2012-03-15 DIAGNOSIS — R0989 Other specified symptoms and signs involving the circulatory and respiratory systems: Secondary | ICD-10-CM | POA: Insufficient documentation

## 2012-03-15 DIAGNOSIS — Z950 Presence of cardiac pacemaker: Secondary | ICD-10-CM | POA: Insufficient documentation

## 2012-03-15 DIAGNOSIS — I2510 Atherosclerotic heart disease of native coronary artery without angina pectoris: Secondary | ICD-10-CM | POA: Insufficient documentation

## 2012-03-15 DIAGNOSIS — I6529 Occlusion and stenosis of unspecified carotid artery: Secondary | ICD-10-CM | POA: Insufficient documentation

## 2012-03-15 DIAGNOSIS — I658 Occlusion and stenosis of other precerebral arteries: Secondary | ICD-10-CM | POA: Insufficient documentation

## 2012-03-15 NOTE — Progress Notes (Signed)
Carotid Duplex Complete Nathaniel Steele 

## 2012-03-16 ENCOUNTER — Ambulatory Visit
Admission: RE | Admit: 2012-03-16 | Discharge: 2012-03-16 | Disposition: A | Discharge status: Home / Self Care | Payer: Medicare Other | Source: Ambulatory Visit | Attending: Orthopedic Surgery | Admitting: Orthopedic Surgery

## 2012-03-16 VITALS — BP 144/77 | HR 75

## 2012-03-16 DIAGNOSIS — M48061 Spinal stenosis, lumbar region without neurogenic claudication: Secondary | ICD-10-CM

## 2012-03-16 MED ORDER — IOHEXOL 180 MG/ML  SOLN
15.0000 mL | Freq: Once | INTRAMUSCULAR | Status: AC | PRN
  Administered 2012-03-16: 15 mL via INTRATHECAL

## 2012-03-16 NOTE — Progress Notes (Signed)
Patient resting quietly lying on stretcher in nursing station after myelogram; wife at bedside.  Patient denies pain or any discomfort.  Donell Sievert, RN

## 2012-04-02 ENCOUNTER — Encounter (HOSPITAL_COMMUNITY): Payer: Self-pay | Admitting: Pharmacy Technician

## 2012-04-06 ENCOUNTER — Ambulatory Visit (HOSPITAL_COMMUNITY)
Admission: RE | Admit: 2012-04-06 | Discharge: 2012-04-06 | Disposition: A | Discharge status: Home / Self Care | Payer: Medicare Other | Source: Ambulatory Visit | Attending: Surgical | Admitting: Surgical

## 2012-04-06 ENCOUNTER — Encounter (HOSPITAL_COMMUNITY)
Admission: RE | Admit: 2012-04-06 | Discharge: 2012-04-06 | Disposition: A | Discharge status: Home / Self Care | Payer: Medicare Other | Source: Ambulatory Visit | Attending: Orthopedic Surgery | Admitting: Orthopedic Surgery

## 2012-04-06 ENCOUNTER — Encounter (HOSPITAL_COMMUNITY): Payer: Self-pay

## 2012-04-06 DIAGNOSIS — Z01812 Encounter for preprocedural laboratory examination: Secondary | ICD-10-CM | POA: Insufficient documentation

## 2012-04-06 DIAGNOSIS — M545 Low back pain, unspecified: Secondary | ICD-10-CM | POA: Insufficient documentation

## 2012-04-06 DIAGNOSIS — M51379 Other intervertebral disc degeneration, lumbosacral region without mention of lumbar back pain or lower extremity pain: Secondary | ICD-10-CM | POA: Insufficient documentation

## 2012-04-06 DIAGNOSIS — Z79899 Other long term (current) drug therapy: Secondary | ICD-10-CM | POA: Insufficient documentation

## 2012-04-06 DIAGNOSIS — M5146 Schmorl's nodes, lumbar region: Secondary | ICD-10-CM | POA: Insufficient documentation

## 2012-04-06 DIAGNOSIS — N189 Chronic kidney disease, unspecified: Secondary | ICD-10-CM | POA: Insufficient documentation

## 2012-04-06 DIAGNOSIS — M79609 Pain in unspecified limb: Secondary | ICD-10-CM | POA: Insufficient documentation

## 2012-04-06 DIAGNOSIS — M329 Systemic lupus erythematosus, unspecified: Secondary | ICD-10-CM | POA: Insufficient documentation

## 2012-04-06 DIAGNOSIS — M4800 Spinal stenosis, site unspecified: Secondary | ICD-10-CM | POA: Insufficient documentation

## 2012-04-06 HISTORY — DX: Presence of cardiac pacemaker: Z95.0

## 2012-04-06 LAB — PROTIME-INR
INR: 2.08 — ABNORMAL HIGH (ref 0.00–1.49)
Prothrombin Time: 22.5 seconds — ABNORMAL HIGH (ref 11.6–15.2)

## 2012-04-06 LAB — COMPREHENSIVE METABOLIC PANEL
ALT: 9 U/L (ref 0–53)
AST: 18 U/L (ref 0–37)
Albumin: 3.8 g/dL (ref 3.5–5.2)
Alkaline Phosphatase: 81 U/L (ref 39–117)
BUN: 53 mg/dL — ABNORMAL HIGH (ref 6–23)
CO2: 27 mEq/L (ref 19–32)
Calcium: 9.5 mg/dL (ref 8.4–10.5)
Chloride: 99 mEq/L (ref 96–112)
Creatinine, Ser: 3.25 mg/dL — ABNORMAL HIGH (ref 0.50–1.35)
GFR calc Af Amer: 19 mL/min — ABNORMAL LOW (ref >90)
GFR calc non Af Amer: 16 mL/min — ABNORMAL LOW (ref >90)
Glucose, Bld: 80 mg/dL (ref 70–99)
Potassium: 4 mEq/L (ref 3.5–5.1)
Sodium: 137 mEq/L (ref 135–145)
Total Bilirubin: 0.5 mg/dL (ref 0.3–1.2)
Total Protein: 6.9 g/dL (ref 6.0–8.3)

## 2012-04-06 LAB — URINALYSIS, ROUTINE W REFLEX MICROSCOPIC
Bilirubin Urine: NEGATIVE
Glucose, UA: NEGATIVE mg/dL
Hgb urine dipstick: NEGATIVE
Ketones, ur: NEGATIVE mg/dL
Leukocytes, UA: NEGATIVE
Nitrite: NEGATIVE
Protein, ur: NEGATIVE mg/dL
Specific Gravity, Urine: 1.013 (ref 1.005–1.030)
Urobilinogen, UA: 0.2 mg/dL (ref 0.0–1.0)
pH: 7 (ref 5.0–8.0)

## 2012-04-06 LAB — APTT: aPTT: 55 seconds — ABNORMAL HIGH (ref 24–37)

## 2012-04-06 LAB — CBC
HCT: 33.6 % — ABNORMAL LOW (ref 39.0–52.0)
Hemoglobin: 10.5 g/dL — ABNORMAL LOW (ref 13.0–17.0)
RDW: 15.8 % — ABNORMAL HIGH (ref 11.5–15.5)
WBC: 4.6 10*3/uL (ref 4.0–10.5)

## 2012-04-06 LAB — SURGICAL PCR SCREEN: Staphylococcus aureus: POSITIVE — AB

## 2012-04-06 NOTE — Progress Notes (Signed)
Jana Half, [employer] Rep notified to be in attendance at patient's surgery on 04/16/2012.

## 2012-04-06 NOTE — Progress Notes (Signed)
04/06/12 1221  OBSTRUCTIVE SLEEP APNEA  Have you ever been diagnosed with sleep apnea through a sleep study? No  Do you snore loudly (loud enough to be heard through closed doors)?  0  Do you often feel tired, fatigued, or sleepy during the daytime? 1  Has anyone observed you stop breathing during your sleep? 0  Do you have, or are you being treated for high blood pressure? 1  BMI more than 35 kg/m2? 0  Age over 77 years old? 1  Neck circumference greater than 40 cm/18 inches? 0  Gender: 1  Obstructive Sleep Apnea Score 4  Score 4 or greater  Results sent to PCP

## 2012-04-06 NOTE — Patient Instructions (Addendum)
20      Your procedure is scheduled on:  Friday 04/16/2012  Report to [facility] Stay Center at  12 noon  Call this number if you have problems the morning of surgery: [phone]   Remember:             IF YOU USE CPAP,BRING MASK AND TUBING AM OF SURGERY!   Do not eat food after midnight! May have clear liquids from midnight up until 0830 am then nothing until after surgery!  Take these medicines the morning of surgery with A SIP OF WATER: Amlodipine,Pacerone, Metoprolol   Do not bring valuables to the hospital.  .  Leave suitcase in the car. After surgery it may be brought to your room.  For patients admitted to the hospital, checkout time is 11:00 AM the day of              Discharge.    DO NOT WEAR JEWELRY , MAKE-UP, LOTIONS,POWDERS,PERFUMES!             WOMEN -DO NOT SHAVE LEGS OR UNDERARMS 12 HRS. BEFORE  SURGERY!               MEN MAY SHAVE AS USUAL!             CONTACTS,DENTURES OR BRIDGEWORK, FALSE EYELASHES MAY NOT BE WORN INTO SURGERY!                                           Patients discharged the day of surgery will not be allowed to drive home.If going home the same day of surgery, must have someone stay with youfirst 24 hrs.at home and arrange for someone to drive you home from the              Hospital.              YOUR DRIVER WU:[id number]   Special Instructions:             Please read over the following fact sheets that you were given:             1. [place] PREPARING FOR SURGERY SHEET              2.[facility] INFORMATION              3.INCENTIVE SPIROMETRY                                        Telford Nab.Anand Tejada,RN,BSN     [phone]                FAILURE TO FOLLOW THESE INSTRUCTIONS MAY RESULT IN  CANCELLATION OF YOUR SURGERY!               Patient Signature:___________________________

## 2012-04-06 NOTE — Progress Notes (Signed)
EKG from [facility] 03/09/2012,Pacemaker Interrogation form 03/09/2012, office note from Dr. Alanda Amass 03/09/2012,Cardiac Catheterization  from [facility] 02/13/2011, Echo 08/27/2011 and Stress test from 05/14/2010 from [facility], all on chart. Cardiac clearance about Coumadin and [employer] Rep. Sheet on chart from 03/25/2012 .

## 2012-04-13 NOTE — Progress Notes (Signed)
Notified Jana Half, rep for [employer], surgery date and time  and post op interrogation will be needed-([employer] operator relayed message and received verification of her arrival to this RN at 1200)

## 2012-04-14 NOTE — H&P (Signed)
Nathaniel Steele is an 79 y.o. male.   Chief Complaint: back pain HPI: Nathaniel Steele presents with the chief complaint of bilateral leg pain. He reports that he has had this discomfort intermittently for the past year s but his pain has been much worse over the past 3 months. He has not fallen or injured the back. He has pain in both buttocks that radiates into the posterior thighs, in addition to numbness and tingling in the right calf. He has some pain at all times but it is increased with prolonged sitting or standing and he has difficulty sleeping at night. His wife who is present with him today says that he stumbles a lot. When asked about this, he says that at times if he does not focus on lifting his right foot, he does not pick it up all the wall when walking. He had back surgery back in 1997 by Dr. Elesa Hacker. He reports no issues other than an occasional back ache until the last few months. He denies groin pain and has had no change in bladder or bowel function. He was treated with a Prednisone pak which helped for a minimal amount of time. CT myelogram revealed severe spinal stenosis in the lumbar spine with disc herniation at L4-L5 on the right.   Past Medical History  Diagnosis Date  . Shortness of breath with exertion  . Chronic kidney disease     not on dialysis yet  . Coronary artery disease 05/14/2010    stress test - no scintigraphic evidence of inducible myocardial ischemia,; normal study  . Hypertension   . Arthritis   . Blood dyscrasia     one time had low platlet count  . Dysrhythmia     atrial fib  . SSS (sick sinus syndrome) 08/27/2011    echo EF >55%, severe LAE, moderate RAE, tissue AVR w/ gradients 35 and  . Pericardial effusion 03/12/2011    echo EF 55-60%  . Claudication 02/19/2012    LE doppler no evidence of arterial insufficiency, lower extremities demonstrate normal values  w/ no evidence of insufficiency  . Lupus nephritis   . Pacemaker 09/19/2004    implanted    Past  Surgical History  Procedure Laterality Date  . Back surgery    . Appendectomy    . Hernia repair    . Pacemaker insertion  2006    [employer] EnRhythm  . Av fistula placement  02/24/2011  . Aortic valve replacement  02/24/2011    pericardial tissue valve [facility] Ease  . Coronary artery bypass graft  02/24/2011    LIMA to LAD, SVG to distal RCA  . Cardiac catheterization  02/13/2011    mod/severe aortic valve stenosis w peak to peak gradient 25-32 mmHg,  70% stenosis LAD, 30-40%proximal followed by 90% sstenosis in RCA prior to anterior RV margin branch    Family History  Problem Relation Age of Onset  . Hypertension Mother   . Kidney disease Brother    Social History:  reports that he quit smoking about 55 years ago. His smoking use included Cigarettes. He smoked 0.00 packs per day. He does not have any smokeless tobacco history on file. He reports that he does not drink alcohol or use illicit drugs.  Allergies:  Allergies  Allergen Reactions  . Aspirin Hives    Patient states that he can take the enteric coated but not the uncoated  . Feraheme (Ferumoxytol) Rash and Other (See Comments)    Abdominal pain  .  Penicillins Hives     Current outpatient prescriptions: amiodarone (PACERONE) 200 MG tablet, Take 200 mg by mouth daily before breakfast. , Disp: , Rfl: ; amLODipine (NORVASC) 5 MG tablet, Take 5 mg by mouth daily before breakfast. , Disp: , Rfl: ;  aspirin EC 81 MG tablet, Take 81 mg by mouth daily., Disp: , Rfl: ;   calcitRIOL (ROCALTROL) 0.25 MCG capsule, Take 0.25 mcg by mouth daily before breakfast. , Disp:  finasteride (PROSCAR) 5 MG tablet, Take 5 mg by mouth daily., Disp: , Rfl: ;   furosemide (LASIX) 40 MG tablet, Take 120 mg by mouth 2 (two) times daily. Takes 3 tablets, Disp: ,  HYDROcodone-acetaminophen (NORCO/VICODIN) 5-325 MG per tablet, Take 1 tablet by mouth every 4 (four) hours as needed for pain., Disp: , Rfl: ;   metoprolol (LOPRESSOR) 50 MG tablet, Take  25 mg by mouth daily before breakfast. Takes 1/2 tablet, Disp: , Rfl:  Multiple Vitamins-Minerals (OCUVITE PRESERVISION PO), Take 1 tablet by mouth daily., Disp: , Rfl: ; rOPINIRole (REQUIP) 0.25 MG tablet, Take 0.75 mg by mouth at bedtime. , Disp: , Rfl: ;   warfarin (COUMADIN) 5 MG tablet, Take 2.5-5 mg by mouth daily. Sunday Tuesday and Thursday takes 1/2 tablet and takes a whole tablet all other days of the week, Disp: , Rfl:    Review of Systems  Constitutional: Negative.   HENT: Positive for hearing loss and tinnitus. Negative for ear pain, nosebleeds, congestion, sore throat, neck pain and ear discharge.   Eyes: Negative.   Respiratory: Positive for shortness of breath. Negative for hemoptysis, sputum production, wheezing and stridor.        SOB with exertion  Cardiovascular: Negative.   Gastrointestinal: Negative.   Genitourinary: Negative.   Musculoskeletal: Positive for back pain and joint pain. Negative for myalgias and falls.  Skin: Negative.   Neurological: Positive for tingling and tremors. Negative for dizziness, sensory change, speech change, focal weakness, seizures, loss of consciousness and headaches.  Endo/Heme/Allergies: Negative.   Psychiatric/Behavioral: Negative.     Vitals Weight: 169 lb Height: 67 in Body Surface Area: 1.9 m Body Mass Index: 26.47 kg/m Pulse: 72 (Regular) BP: 112/68 (Sitting, Left Arm, Standard)  Physical Exam  Constitutional: He is oriented to person, place, and time. He appears well-developed and well-nourished. No distress.  HENT:  Head: Normocephalic and atraumatic.  Right Ear: External ear normal.  Left Ear: External ear normal.  Nose: Nose normal.  Mouth/Throat: Oropharynx is clear and moist.  Eyes: Conjunctivae and EOM are normal.  Neck: Normal range of motion. Neck supple. No tracheal deviation present. No thyromegaly present.  Cardiovascular: Normal rate, regular rhythm, normal heart sounds and intact distal pulses.    No murmur heard. Respiratory: Effort normal and breath sounds normal. No respiratory distress. He has no wheezes. He exhibits no tenderness.  GI: Soft. He exhibits no distension. There is no tenderness.  Musculoskeletal:       Right hip: Normal.       Left hip: Normal.       Lumbar back: He exhibits decreased range of motion and pain. He exhibits no tenderness.       Right lower leg: He exhibits no tenderness and no swelling.       Left lower leg: He exhibits no tenderness and no swelling.  Positive SLR on the right   Lymphadenopathy:    He has no cervical adenopathy.  Neurological: He is alert and oriented to person, place, and time. He  has normal strength and normal reflexes. No sensory deficit.  Skin: No rash noted. He is not diaphoretic. No erythema.  Psychiatric: He has a normal mood and affect. His behavior is normal.     Assessment/Plan Lumbar spinal stenosis with lumbar disc herniation at L4-L5 on the right  He needs to have a central decompressive lumbar laminectomy at L4-5. The possible complications of spinal surgery number one could be infection, which is extremely rare. We do use antibiotics prior to the surgery and during surgery and after surgery. Number two is always a slight degree of probability that you could develop a blood clot in your leg after any type of surgery and we try our best to prevent that with aspirin post op when it is safe to begin. The third is a dural leak. That is the spinal fluid leak that could occur. At certain rare times the bone or the disc could literally stick to the dura which is the lining which contains the spinal fluid and we could develop a small tear in that lining which we then patch up. That is an extremely rare complication. The last and final complication is a recurrent disc rupture. That means that you could rupture another small piece of disc later on down the road and there is about a 2% chance of that.   [address]  PA-C  Trevino Wyatt LAUREN 04/14/2012, 8:09 AM

## 2012-04-16 ENCOUNTER — Encounter (HOSPITAL_COMMUNITY): Payer: Self-pay | Admitting: Anesthesiology

## 2012-04-16 ENCOUNTER — Inpatient Hospital Stay (HOSPITAL_COMMUNITY)
Admission: RE | Admit: 2012-04-16 | Discharge: 2012-04-17 | DRG: 491 | Disposition: A | Discharge status: Home / Self Care | Payer: Medicare Other | Source: Ambulatory Visit | Attending: Orthopedic Surgery | Admitting: Orthopedic Surgery

## 2012-04-16 ENCOUNTER — Inpatient Hospital Stay (HOSPITAL_COMMUNITY): Payer: Medicare Other

## 2012-04-16 ENCOUNTER — Inpatient Hospital Stay (HOSPITAL_COMMUNITY): Payer: Medicare Other | Admitting: Anesthesiology

## 2012-04-16 ENCOUNTER — Encounter (HOSPITAL_COMMUNITY)
Admission: RE | Disposition: A | Discharge status: Home / Self Care | Payer: Self-pay | Source: Ambulatory Visit | Attending: Orthopedic Surgery

## 2012-04-16 ENCOUNTER — Encounter (HOSPITAL_COMMUNITY): Payer: Self-pay | Admitting: *Deleted

## 2012-04-16 DIAGNOSIS — I2510 Atherosclerotic heart disease of native coronary artery without angina pectoris: Secondary | ICD-10-CM | POA: Diagnosis present

## 2012-04-16 DIAGNOSIS — Z950 Presence of cardiac pacemaker: Secondary | ICD-10-CM

## 2012-04-16 DIAGNOSIS — N189 Chronic kidney disease, unspecified: Secondary | ICD-10-CM | POA: Diagnosis present

## 2012-04-16 DIAGNOSIS — Z880 Allergy status to penicillin: Secondary | ICD-10-CM

## 2012-04-16 DIAGNOSIS — I129 Hypertensive chronic kidney disease with stage 1 through stage 4 chronic kidney disease, or unspecified chronic kidney disease: Secondary | ICD-10-CM | POA: Diagnosis present

## 2012-04-16 DIAGNOSIS — M48061 Spinal stenosis, lumbar region without neurogenic claudication: Secondary | ICD-10-CM | POA: Diagnosis present

## 2012-04-16 DIAGNOSIS — Z79899 Other long term (current) drug therapy: Secondary | ICD-10-CM

## 2012-04-16 DIAGNOSIS — Z951 Presence of aortocoronary bypass graft: Secondary | ICD-10-CM

## 2012-04-16 DIAGNOSIS — M48062 Spinal stenosis, lumbar region with neurogenic claudication: Secondary | ICD-10-CM | POA: Diagnosis present

## 2012-04-16 DIAGNOSIS — N059 Unspecified nephritic syndrome with unspecified morphologic changes: Secondary | ICD-10-CM | POA: Diagnosis present

## 2012-04-16 DIAGNOSIS — Z7901 Long term (current) use of anticoagulants: Secondary | ICD-10-CM

## 2012-04-16 DIAGNOSIS — I495 Sick sinus syndrome: Secondary | ICD-10-CM | POA: Diagnosis present

## 2012-04-16 DIAGNOSIS — M329 Systemic lupus erythematosus, unspecified: Secondary | ICD-10-CM | POA: Diagnosis present

## 2012-04-16 DIAGNOSIS — M5126 Other intervertebral disc displacement, lumbar region: Principal | ICD-10-CM | POA: Diagnosis present

## 2012-04-16 DIAGNOSIS — Z954 Presence of other heart-valve replacement: Secondary | ICD-10-CM

## 2012-04-16 HISTORY — PX: LUMBAR LAMINECTOMY/DECOMPRESSION MICRODISCECTOMY: SHX5026

## 2012-04-16 LAB — PROTIME-INR: INR: 1.41 (ref 0.00–1.49)

## 2012-04-16 SURGERY — LUMBAR LAMINECTOMY/DECOMPRESSION MICRODISCECTOMY 1 LEVEL
Anesthesia: General | Laterality: Right | Wound class: Clean

## 2012-04-16 MED ORDER — BISACODYL 10 MG RE SUPP: 10.0000 mg | Freq: Every day | RECTAL | Status: DC | PRN

## 2012-04-16 MED ORDER — FINASTERIDE 5 MG PO TABS
5.0000 mg | ORAL_TABLET | Freq: Every day | ORAL | Status: DC
  Administered 2012-04-16 – 2012-04-17 (×2): 5 mg via ORAL
  Filled 2012-04-16 (×2): qty 1

## 2012-04-16 MED ORDER — POLYETHYLENE GLYCOL 3350 17 G PO PACK
17.0000 g | PACK | Freq: Every day | ORAL | Status: DC | PRN
  Filled 2012-04-16: qty 1

## 2012-04-16 MED ORDER — HYDROMORPHONE HCL PF 1 MG/ML IJ SOLN
0.5000 mg | INTRAMUSCULAR | Status: DC | PRN
  Administered 2012-04-17: 1 mg via INTRAVENOUS
  Filled 2012-04-16: qty 1

## 2012-04-16 MED ORDER — DEXTROSE 5 % IV SOLN: 10.0000 mg | INTRAVENOUS | Status: DC | PRN

## 2012-04-16 MED ORDER — CLINDAMYCIN PHOSPHATE 900 MG/50ML IV SOLN
900.0000 mg | INTRAVENOUS | Status: AC
  Administered 2012-04-16: 900 mg via INTRAVENOUS
  Filled 2012-04-16: qty 50

## 2012-04-16 MED ORDER — PROPOFOL 10 MG/ML IV BOLUS
INTRAVENOUS | Status: DC | PRN
  Administered 2012-04-16: 130 mg via INTRAVENOUS

## 2012-04-16 MED ORDER — ACETAMINOPHEN 650 MG RE SUPP: 650.0000 mg | RECTAL | Status: DC | PRN

## 2012-04-16 MED ORDER — METHOCARBAMOL 100 MG/ML IJ SOLN: 500.0000 mg | Freq: Four times a day (QID) | INTRAVENOUS | Status: DC | PRN

## 2012-04-16 MED ORDER — LACTATED RINGERS IV SOLN: INTRAVENOUS | Status: DC

## 2012-04-16 MED ORDER — OXYCODONE-ACETAMINOPHEN 5-325 MG PO TABS: 1.0000 | ORAL_TABLET | ORAL | Status: DC | PRN

## 2012-04-16 MED ORDER — ACETAMINOPHEN 325 MG PO TABS: 650.0000 mg | ORAL_TABLET | ORAL | Status: DC | PRN

## 2012-04-16 MED ORDER — BUPIVACAINE LIPOSOME 1.3 % IJ SUSP
20.0000 mL | Freq: Once | INTRAMUSCULAR | Status: DC
  Filled 2012-04-16: qty 20

## 2012-04-16 MED ORDER — ONDANSETRON HCL 4 MG/2ML IJ SOLN: 4.0000 mg | INTRAMUSCULAR | Status: DC | PRN

## 2012-04-16 MED ORDER — BUPIVACAINE LIPOSOME 1.3 % IJ SUSP
INTRAMUSCULAR | Status: DC | PRN
  Administered 2012-04-16: 20 mL

## 2012-04-16 MED ORDER — FENTANYL CITRATE 0.05 MG/ML IJ SOLN
INTRAMUSCULAR | Status: DC | PRN
  Administered 2012-04-16: 25 ug via INTRAVENOUS
  Administered 2012-04-16: 50 ug via INTRAVENOUS
  Administered 2012-04-16 (×3): 25 ug via INTRAVENOUS
  Administered 2012-04-16: 50 ug via INTRAVENOUS

## 2012-04-16 MED ORDER — FUROSEMIDE 80 MG PO TABS
120.0000 mg | ORAL_TABLET | Freq: Two times a day (BID) | ORAL | Status: DC
  Administered 2012-04-16 – 2012-04-17 (×2): 120 mg via ORAL
  Filled 2012-04-16 (×5): qty 1

## 2012-04-16 MED ORDER — METOPROLOL TARTRATE 25 MG PO TABS
25.0000 mg | ORAL_TABLET | Freq: Every day | ORAL | Status: DC
  Administered 2012-04-17: 25 mg via ORAL
  Filled 2012-04-16: qty 1

## 2012-04-16 MED ORDER — ACETAMINOPHEN 10 MG/ML IV SOLN
INTRAVENOUS | Status: DC | PRN
  Administered 2012-04-16: 1000 mg via INTRAVENOUS

## 2012-04-16 MED ORDER — NEOSTIGMINE METHYLSULFATE 1 MG/ML IJ SOLN
INTRAMUSCULAR | Status: DC | PRN
  Administered 2012-04-16: 4 mg via INTRAVENOUS

## 2012-04-16 MED ORDER — PHENOL 1.4 % MT LIQD: 1.0000 | OROMUCOSAL | Status: DC | PRN

## 2012-04-16 MED ORDER — FLEET ENEMA 7-19 GM/118ML RE ENEM: 1.0000 | ENEMA | Freq: Once | RECTAL | Status: AC | PRN

## 2012-04-16 MED ORDER — AMIODARONE HCL 200 MG PO TABS
200.0000 mg | ORAL_TABLET | Freq: Every day | ORAL | Status: DC
  Administered 2012-04-17: 200 mg via ORAL
  Filled 2012-04-16: qty 1

## 2012-04-16 MED ORDER — PHENYLEPHRINE HCL 10 MG/ML IJ SOLN
20.0000 mg | INTRAVENOUS | Status: DC | PRN
  Administered 2012-04-16: 50 ug/min via INTRAVENOUS

## 2012-04-16 MED ORDER — SODIUM CHLORIDE 0.9 % IV SOLN
INTRAVENOUS | Status: DC
  Administered 2012-04-16 – 2012-04-17 (×3): via INTRAVENOUS

## 2012-04-16 MED ORDER — MENTHOL 3 MG MT LOZG: 1.0000 | LOZENGE | OROMUCOSAL | Status: DC | PRN

## 2012-04-16 MED ORDER — HYDROMORPHONE HCL PF 1 MG/ML IJ SOLN
0.2500 mg | INTRAMUSCULAR | Status: DC | PRN
  Administered 2012-04-16 (×2): 0.5 mg via INTRAVENOUS

## 2012-04-16 MED ORDER — SODIUM CHLORIDE 0.9 % IR SOLN
Status: DC | PRN
  Administered 2012-04-16: 16:00:00

## 2012-04-16 MED ORDER — METHOCARBAMOL 500 MG PO TABS
500.0000 mg | ORAL_TABLET | Freq: Four times a day (QID) | ORAL | Status: DC | PRN
  Administered 2012-04-17: 500 mg via ORAL
  Filled 2012-04-16: qty 1

## 2012-04-16 MED ORDER — CISATRACURIUM BESYLATE (PF) 10 MG/5ML IV SOLN
INTRAVENOUS | Status: DC | PRN
  Administered 2012-04-16: 6 mg via INTRAVENOUS

## 2012-04-16 MED ORDER — ONDANSETRON HCL 4 MG/2ML IJ SOLN
INTRAMUSCULAR | Status: DC | PRN
  Administered 2012-04-16: 4 mg via INTRAVENOUS

## 2012-04-16 MED ORDER — CLINDAMYCIN PHOSPHATE 600 MG/50ML IV SOLN
600.0000 mg | Freq: Three times a day (TID) | INTRAVENOUS | Status: AC
  Administered 2012-04-17 (×2): 600 mg via INTRAVENOUS
  Filled 2012-04-16 (×2): qty 50

## 2012-04-16 MED ORDER — SUCCINYLCHOLINE CHLORIDE 20 MG/ML IJ SOLN
INTRAMUSCULAR | Status: DC | PRN
  Administered 2012-04-16: 100 mg via INTRAVENOUS

## 2012-04-16 MED ORDER — AMLODIPINE BESYLATE 5 MG PO TABS
5.0000 mg | ORAL_TABLET | Freq: Every day | ORAL | Status: DC
  Administered 2012-04-17: 5 mg via ORAL
  Filled 2012-04-16: qty 1

## 2012-04-16 MED ORDER — LACTATED RINGERS IV SOLN
INTRAVENOUS | Status: DC
Start: 2012-04-16 — End: 2012-04-16

## 2012-04-16 MED ORDER — GLYCOPYRROLATE 0.2 MG/ML IJ SOLN
INTRAMUSCULAR | Status: DC | PRN
  Administered 2012-04-16: .6 mg via INTRAVENOUS

## 2012-04-16 MED ORDER — HYDROCODONE-ACETAMINOPHEN 5-325 MG PO TABS
1.0000 | ORAL_TABLET | ORAL | Status: DC | PRN
  Administered 2012-04-17 (×2): 1 via ORAL
  Filled 2012-04-16 (×2): qty 1

## 2012-04-16 MED ORDER — LIDOCAINE HCL (CARDIAC) 20 MG/ML IV SOLN
INTRAVENOUS | Status: DC | PRN
  Administered 2012-04-16: 100 mg via INTRAVENOUS

## 2012-04-16 SURGICAL SUPPLY — 44 items
BAG ZIPLOCK 12X15 (MISCELLANEOUS) ×2 IMPLANT
BENZOIN TINCTURE PRP APPL 2/3 (GAUZE/BANDAGES/DRESSINGS) ×2 IMPLANT
CATH ROBINSON RED A/P 16FR (CATHETERS) ×2 IMPLANT
CLEANER TIP ELECTROSURG 2X2 (MISCELLANEOUS) ×2 IMPLANT
CLOTH BEACON ORANGE TIMEOUT ST (SAFETY) ×2 IMPLANT
CONT SPECI 4OZ STER CLIK (MISCELLANEOUS) ×2 IMPLANT
DRAIN PENROSE 18X1/4 LTX STRL (WOUND CARE) IMPLANT
DRAPE MICROSCOPE LEICA (MISCELLANEOUS) ×2 IMPLANT
DRAPE POUCH INSTRU U-SHP 10X18 (DRAPES) ×2 IMPLANT
DRAPE SURG 17X11 SM STRL (DRAPES) ×2 IMPLANT
DRSG ADAPTIC 3X8 NADH LF (GAUZE/BANDAGES/DRESSINGS) ×2 IMPLANT
DRSG PAD ABDOMINAL 8X10 ST (GAUZE/BANDAGES/DRESSINGS) ×2 IMPLANT
DURAPREP 26ML APPLICATOR (WOUND CARE) ×2 IMPLANT
ELECT REM PT RETURN 9FT ADLT (ELECTROSURGICAL) ×2
ELECTRODE REM PT RTRN 9FT ADLT (ELECTROSURGICAL) ×1 IMPLANT
GLOVE BIOGEL PI IND STRL 8 (GLOVE) ×2 IMPLANT
GLOVE BIOGEL PI INDICATOR 8 (GLOVE) ×2
GLOVE ECLIPSE 8.0 STRL XLNG CF (GLOVE) ×4 IMPLANT
GOWN PREVENTION PLUS LG XLONG (DISPOSABLE) ×4 IMPLANT
GOWN STRL REIN XL XLG (GOWN DISPOSABLE) ×4 IMPLANT
KIT BASIN OR (CUSTOM PROCEDURE TRAY) ×2 IMPLANT
KIT POSITIONING SURG ANDREWS (MISCELLANEOUS) ×2 IMPLANT
MANIFOLD NEPTUNE II (INSTRUMENTS) ×2 IMPLANT
NEEDLE SPNL 18GX3.5 QUINCKE PK (NEEDLE) ×4 IMPLANT
NS IRRIG 1000ML POUR BTL (IV SOLUTION) ×2 IMPLANT
PATTIES SURGICAL .5 X.5 (GAUZE/BANDAGES/DRESSINGS) IMPLANT
PATTIES SURGICAL .75X.75 (GAUZE/BANDAGES/DRESSINGS) IMPLANT
PATTIES SURGICAL 1X1 (DISPOSABLE) IMPLANT
PIN SAFETY NICK PLATE  2 MED (MISCELLANEOUS)
PIN SAFETY NICK PLATE 2 MED (MISCELLANEOUS) IMPLANT
POSITIONER SURGICAL ARM (MISCELLANEOUS) ×2 IMPLANT
SPONGE GAUZE 4X4 12PLY (GAUZE/BANDAGES/DRESSINGS) ×2 IMPLANT
SPONGE LAP 4X18 X RAY DECT (DISPOSABLE) IMPLANT
SPONGE SURGIFOAM ABS GEL 100 (HEMOSTASIS) ×2 IMPLANT
STAPLER VISISTAT 35W (STAPLE) ×2 IMPLANT
SUT VIC AB 0 CT1 27 (SUTURE) ×1
SUT VIC AB 0 CT1 27XBRD ANTBC (SUTURE) ×1 IMPLANT
SUT VIC AB 1 CT1 27 (SUTURE) ×4
SUT VIC AB 1 CT1 27XBRD ANTBC (SUTURE) ×4 IMPLANT
TAPE CLOTH SURG 4X10 WHT LF (GAUZE/BANDAGES/DRESSINGS) ×2 IMPLANT
TOWEL OR 17X26 10 PK STRL BLUE (TOWEL DISPOSABLE) ×4 IMPLANT
TRAY FOLEY CATH 14FRSI W/METER (CATHETERS) ×2 IMPLANT
TRAY LAMINECTOMY (CUSTOM PROCEDURE TRAY) ×2 IMPLANT
WATER STERILE IRR 1500ML POUR (IV SOLUTION) ×2 IMPLANT

## 2012-04-16 NOTE — Anesthesia Postprocedure Evaluation (Signed)
  Anesthesia Post-op Note  Patient: Nathaniel Steele  Procedure(s) Performed: Procedure(s) (LRB): DECOMPRESSIVE L4 - L5/ MICRODISCECTOMY ON THE RIGHT 1 LEVEL (Right)  Patient Location: PACU  Anesthesia Type: General  Level of Consciousness: awake and alert   Airway and Oxygen Therapy: Patient Spontanous Breathing  Post-op Pain: mild  Post-op Assessment: Post-op Vital signs reviewed, Patient's Cardiovascular Status Stable, Respiratory Function Stable, Patent Airway and No signs of Nausea or vomiting  Last Vitals:  Filed Vitals:   04/16/12 1715  BP:   Pulse:   Temp: 36.6 C  Resp:     Post-op Vital Signs: stable   Complications: No apparent anesthesia complications

## 2012-04-16 NOTE — Preoperative (Signed)
Beta Blockers   Reason not to administer Beta Blockers:Not Applicable Pt took Beta Blocker 04-16-12 AM 

## 2012-04-16 NOTE — Interval H&P Note (Signed)
History and Physical Interval Note:  04/16/2012 2:34 PM  Nathaniel Steele  has presented today for surgery, with the diagnosis of spinal stenosis  The various methods of treatment have been discussed with the patient and family. After consideration of risks, benefits and other options for treatment, the patient has consented to  Procedure(s): DECOMPRESSIVE L4 - L5/ MICRODISCECTOMY ON THE RIGHT 1 LEVEL (Right) as a surgical intervention .  The patient's history has been reviewed, patient examined, no change in status, stable for surgery.  I have reviewed the patient's chart and labs.  Questions were answered to the patient's satisfaction.     Constance Whittle A

## 2012-04-16 NOTE — Anesthesia Preprocedure Evaluation (Addendum)
Anesthesia Evaluation  Patient identified by MRN, date of birth, ID band Patient awake    Reviewed: Allergy & Precautions, H&P , NPO status , Patient's Chart, lab work & pertinent test results, reviewed documented beta blocker date and time   Airway Mallampati: II TM Distance: >3 FB Neck ROM: full    Dental  (+) Edentulous Upper and Edentulous Lower   Pulmonary shortness of breath and with exertion,  Hx. Respiratory failure.  breath sounds clear to auscultation  Pulmonary exam normal       Cardiovascular Exercise Tolerance: Poor hypertension, Pt. on medications and Pt. on home beta blockers + CAD and + CABG + dysrhythmias Atrial Fibrillation + pacemaker Rhythm:Irregular Rate:Normal  S/p AVR.  2012 myoview no ischemia. EF 55 - 60%.  ECG AF.  On coumadin.  Pacemaker 2006.  Pericardial effusion   Neuro/Psych negative neurological ROS  negative psych ROS   GI/Hepatic negative GI ROS, Neg liver ROS,   Endo/Other  negative endocrine ROS  Renal/GU CRFRenal diseaseCRT 3.25 BUN 60  negative genitourinary   Musculoskeletal   Abdominal   Peds  Hematology negative hematology ROS (+) Blood dyscrasia, anemia , Hgb 10.5   Anesthesia Other Findings LUPUS  Reproductive/Obstetrics negative OB ROS                          Anesthesia Physical Anesthesia Plan  ASA: IV  Anesthesia Plan: General   Post-op Pain Management:    Induction: Intravenous  Airway Management Planned: Oral ETT  Additional Equipment:   Intra-op Plan:   Post-operative Plan: Extubation in OR  Informed Consent: I have reviewed the patients History and Physical, chart, labs and discussed the procedure including the risks, benefits and alternatives for the proposed anesthesia with the patient or authorized representative who has indicated his/her understanding and acceptance.   Dental Advisory Given  Plan Discussed with: CRNA and  Surgeon  Anesthesia Plan Comments:        Anesthesia Quick Evaluation

## 2012-04-16 NOTE — Transfer of Care (Signed)
Immediate Anesthesia Transfer of Care Note  Patient: Nathaniel Steele  Procedure(s) Performed: Procedure(s): DECOMPRESSIVE L4 - L5/ MICRODISCECTOMY ON THE RIGHT 1 LEVEL (Right)  Patient Location: PACU  Anesthesia Type:General  Level of Consciousness: awake, alert , oriented and patient cooperative  Airway & Oxygen Therapy: Patient Spontanous Breathing and Patient connected to face mask oxygen  Post-op Assessment: Report given to PACU RN, Post -op Vital signs reviewed and stable and Patient moving all extremities  Post vital signs: Reviewed and stable  Complications: No apparent anesthesia complications

## 2012-04-16 NOTE — Brief Op Note (Signed)
04/16/2012  4:54 PM  PATIENT:  Nathaniel Steele  79 y.o. male  PRE-OPERATIVE DIAGNOSIS:  spinal stenosis,Recurrent with compression of TWO NERVE ROOTS,Bilaterally  POST-OPERATIVE DIAGNOSIS:  spinal stenosis,Recurrent with compression of TWO NERVE ROOTS,Bilaterally.  PROCEDURE:  Procedure(s): DECOMPRESSIVE L4 - L5/ MICRODISCECTOMY ON THE RIGHT 1 LEVEL (Right)  SURGEON:  Surgeon(s) and Role:    * Jacki Cones, MD - Primary    * Javier Docker, MD - Assisting  :   ASSISTANTS: Paula Libra MD}   ANESTHESIA:   general  EBL:     BLOOD ADMINISTERED:none  DRAINS: none   LOCAL MEDICATIONS USED:  BUPIVICAINE 20cc  SPECIMEN:  No Specimen  DISPOSITION OF SPECIMEN:  N/A  COUNTS:  YES  TOURNIQUET:  * No tourniquets in log *  DICTATION: .Other Dictation: Dictation Number [id number]  PLAN OF CARE: Admit to inpatient   PATIENT DISPOSITION:  PACU - hemodynamically stable.   Delay start of Pharmacological VTE agent (>24hrs) due to surgical blood loss or risk of bleeding: yes

## 2012-04-17 LAB — CBC
Hemoglobin: 9.2 g/dL — ABNORMAL LOW (ref 13.0–17.0)
MCH: 28.2 pg (ref 26.0–34.0)
MCHC: 31.9 g/dL (ref 30.0–36.0)
RDW: 16.1 % — ABNORMAL HIGH (ref 11.5–15.5)

## 2012-04-17 LAB — COMPREHENSIVE METABOLIC PANEL
ALT: 10 U/L (ref 0–53)
AST: 20 U/L (ref 0–37)
Alkaline Phosphatase: 66 U/L (ref 39–117)
CO2: 24 mEq/L (ref 19–32)
Calcium: 8.9 mg/dL (ref 8.4–10.5)
Chloride: 104 mEq/L (ref 96–112)
GFR calc Af Amer: 24 mL/min — ABNORMAL LOW (ref >90)
GFR calc non Af Amer: 21 mL/min — ABNORMAL LOW (ref >90)
Glucose, Bld: 102 mg/dL — ABNORMAL HIGH (ref 70–99)
Potassium: 3.9 mEq/L (ref 3.5–5.1)
Sodium: 140 mEq/L (ref 135–145)

## 2012-04-17 MED ORDER — METHOCARBAMOL 500 MG PO TABS: 500.0000 mg | ORAL_TABLET | Freq: Four times a day (QID) | ORAL | Status: DC | PRN

## 2012-04-17 MED ORDER — OXYCODONE-ACETAMINOPHEN 5-325 MG PO TABS: 1.0000 | ORAL_TABLET | ORAL | Status: DC | PRN

## 2012-04-17 NOTE — Progress Notes (Signed)
Nathaniel Steele  MRN: [id number] DOB/Age: 1926/11/20 79 y.o. Physician: Lynnea Maizes, M.D. 1 Day Post-Op Procedure(s) (LRB): DECOMPRESSIVE L4 - L5/ MICRODISCECTOMY ON THE RIGHT 1 LEVEL (Right)  Subjective: Up much of the night secondary to frequent urination, reports minimal pain, walked around floor already this am. Vital Signs Temp:  [97.4 F (36.3 C)-100.3 F (37.9 C)] 99.5 F (37.5 C) (03/29 0556) Pulse Rate:  [69-82] 70 (03/29 0509) Resp:  [12-20] 14 (03/29 0509) BP: (116-156)/(62-85) 117/62 mmHg (03/29 0509) SpO2:  [95 %-100 %] 95 % (03/29 0509) Weight:  [75.297 kg (166 lb)] 75.297 kg (166 lb) (03/28 1829)  Lab Results  Recent Labs  04/17/12 0527  WBC 6.2  HGB 9.2*  HCT 28.8*  PLT 100*   BMET  Recent Labs  04/17/12 0527  NA 140  K 3.9  CL 104  CO2 24  GLUCOSE 102*  BUN 42*  CREATININE 2.67*  CALCIUM 8.9   INR  Date Value Range Status  04/16/2012 1.41  0.00 - 1.49 Final     Exam  Sitting comfortably, NV intact LE, excellent strength and motion LE's.  Plan DC home, F/U with Dr. Darrelyn Hillock approx 2 weeks from surgery  Freda Jaquith M 04/17/2012, 9:23 AM

## 2012-04-17 NOTE — Op Note (Signed)
NAMEKALIX, MEINECKE NO.:  [id number]  MEDICAL RECORD NO.:  [id number]  LOCATION:  1445                         FACILITY:  [facility]  PHYSICIAN:  Georges Lynch. Tesha Archambeau, M.D.DATE OF BIRTH:  1926/12/05  DATE OF PROCEDURE:  04/16/2012 DATE OF DISCHARGE:                              OPERATIVE REPORT   SURGEON:  Georges Lynch. Darrelyn Hillock, M.D.  ASSISTANT:  Rock Nephew MD.  PREOPERATIVE DIAGNOSIS:  Recurrent spinal stenosis at L4-L5 with compression of the L4 and the L5 root bilaterally.  He mainly had pain in his right lower extremity.  POSTOPERATIVE DIAGNOSIS:  Recurrent spinal stenosis at L4-L5 with compression of the L4 and the L5 root bilaterally.  He mainly had pain in his right lower extremity.  He had previous surgery by Dr. Radford Pax about 17 years ago at the same level.  PROCEDURE IN DETAIL:  Under general anesthesia with the patient on the Wilson frame, routine orthopedic prep and draping of the back was carried out.  He had 650 mg of clindamycin IV.  I also marked the appropriate time-out was carried out prior to surgery.  Also I marked the appropriate right side of the back even though we were going central prior to surgery.  Once he was on the table and prepared on the operating table and his prep was completed, 2 needles were placed in the back for localization purposes.  X-ray was taken.  Following that, an incision was made after sterile prep and draping.  Incision was made over the L4-L5 space.  The excision was extended distally and proximally in the usual fashion.  At this time, I went down and stripped the muscle from the lamina and spinous process of L4-L5 region, and 2 Kocher clamps were in place, and another x-ray was taken.  I then removed the spinous process of L4.  We carefully went down because of the previous surgery, brought the microscope in and began our decompressive lumbar laminectomy.  We went first onto the left unaffected side prior  his previous surgery, went around proximally and distally, made sure the dura was totally free, the dura was protected at all time.  We got over to the right, affected side.  The dura was extremely scarred down to the bone.  We went proximal and distal to that area and made sure that the roots were decompressed out the foramina.  The dura literally was adhered mainly to the pedicle on lateral recess region.  There was no way to free this dura up without tearing the dura.  We did not feel that was worth the risk of injuring this patient, doing that procedure as long as we decompressed them distally proximally and over top of the dura per se.  We were able to easily pass a hockey-stick out distally proximally and out the foramina.  Note another x-ray was taken in surgery.  We went proximal and distal to that probe, until we were totally decompressed.  We thoroughly irrigated out the area.  I then loosely applied some thrombin-soaked Gelfoam.  We closed the wound in layers in the usual fashion except I left a small distal deep and proximal part of the  wound open for drainage purposes.  Subcu was closed with 0 Vicryl, skin with metal staples.  Sterile Neosporin dressing was applied.  Prior to closing the wound, I injected 20 mL of Exparel into the soft tissue structures.  The patient did have an in and out cath at the end of the procedure.          ______________________________ Georges Lynch. Darrelyn Hillock, M.D.     RAG/MEDQ  D:  04/16/2012  T:  04/17/2012  Job:  [id number]

## 2012-04-17 NOTE — Evaluation (Signed)
Occupational Therapy Evaluation Patient Details Name: Nathaniel Steele MRN: [id number] DOB: [birth year] Today's Date: 04/17/2012 Time: 4696-2952 OT Time Calculation (min): 18 min  OT Assessment / Plan / Recommendation Clinical Impression  Pt admitted for lumbar decompression and microdiscectomy.  Educated in back precautions related to mobility and ADL.  No further OT needs.    OT Assessment  Patient does not need any further OT services    Follow Up Recommendations  No OT follow up    Barriers to Discharge      Equipment Recommendations  None recommended by OT    Recommendations for Other Services    Frequency       Precautions / Restrictions Precautions Precautions: Back Precaution Booklet Issued: Yes (comment) Precaution Comments: reviewed back precautions and pt given handout   Pertinent Vitals/Pain Soreness in back, repositioned    ADL  Eating/Feeding: Independent Where Assessed - Eating/Feeding: Chair Transfers/Ambulation Related to ADLs: Independent, no device ADL Comments: Pt is able to cross his foot over opposite knee to access feet for ADL.    OT Diagnosis:    OT Problem List:   OT Treatment Interventions:     OT Goals    Visit Information  Last OT Received On: 04/17/12 Assistance Needed: +1 PT/OT Co-Evaluation/Treatment: Yes    Subjective Data  Subjective: "I'm alright." Patient Stated Goal: Home today.   Prior Functioning     Home Living Lives With: Spouse;Son Available Help at Discharge: Family;Available 24 hours/day Type of Home: House Home Access: Level entry Home Layout: One level Bathroom Shower/Tub: Tub/shower unit;Door [employer] Toilet: Handicapped height Bathroom Accessibility: Yes How Accessible: Accessible via walker Home Adaptive Equipment: Walker - rolling;Shower chair with back Prior Function Level of Independence: Independent Able to Take Stairs?: Yes Driving: Yes Vocation: Retired Musician: No  difficulties Dominant Hand: Right         Vision/Perception     Copywriter, advertising Overall Cognitive Status: Appears within functional limits for tasks assessed/performed Arousal/Alertness: Awake/alert Orientation Level: Appears intact for tasks assessed Behavior During Session: [facility] for tasks performed    Extremity/Trunk Assessment Right Upper Extremity Assessment RUE ROM/Strength/Tone: WFL for tasks assessed RUE Coordination: WFL - gross/fine motor Left Upper Extremity Assessment LUE ROM/Strength/Tone: WFL for tasks assessed LUE Coordination: WFL - gross/fine motor Right Lower Extremity Assessment RLE ROM/Strength/Tone: WFL for tasks assessed Left Lower Extremity Assessment LLE ROM/Strength/Tone: WFL for tasks assessed Trunk Assessment Trunk Assessment: Normal     Mobility Bed Mobility Bed Mobility: Not assessed Details for Bed Mobility Assistance: pt reports he has been performing log roll technique for bed mobility for years Transfers Transfers: Sit to Stand;Stand to Sit Sit to Stand: 7: Independent;With upper extremity assist;From chair/3-in-1 Stand to Sit: 7: Independent;With upper extremity assist;To chair/3-in-1 Details for Transfer Assistance: verbal cue for keeping back straight     Exercise     Balance     End of Session OT - End of Session Activity Tolerance: Patient tolerated treatment well Patient left: in chair  GO     Evern Bio 04/17/2012, 12:13 PM [phone]

## 2012-04-17 NOTE — Evaluation (Signed)
Physical Therapy One Time Evaluation Patient Details Name: Nathaniel Steele MRN: [id number] DOB: 12/03/1926 Today's Date: 04/17/2012 Time: 4098-1191 PT Time Calculation (min): 15 min  PT Assessment / Plan / Recommendation Clinical Impression  Pt is an 79 year old male s/p L4-5 decompression and microdiscectomy.  Pt very active prior to surgery and currently supervision level only for reminding pt of back precautions.  Reviewed, demonstrated and discussed back precautions and pt given handout.  Pt reports he is ready for d/c home with spouse.  No further PT needs identified.    PT Assessment  Patent does not need any further PT services    Follow Up Recommendations  No PT follow up    Does the patient have the potential to tolerate intense rehabilitation      Barriers to Discharge        Equipment Recommendations  None recommended by PT    Recommendations for Other Services     Frequency      Precautions / Restrictions Precautions Precautions: Back Precaution Comments: reviewed back precautions and pt given handout   Pertinent Vitals/Pain Pt report pain under control.      Mobility  Bed Mobility Bed Mobility: Not assessed Details for Bed Mobility Assistance: pt reports he has been performing log roll technique for bed mobility for years Transfers Transfers: Sit to Stand;Stand to Sit Sit to Stand: 5: Supervision;From chair/3-in-1;With upper extremity assist Stand to Sit: 5: Supervision;To chair/3-in-1;With upper extremity assist Details for Transfer Assistance: verbal cue for keeping back straight Ambulation/Gait Ambulation/Gait Assistance: 5: Supervision Ambulation Distance (Feet): 400 Feet Assistive device: None Ambulation/Gait Assistance Details: no unsteady gait or LOB observed Gait Pattern: Within Functional Limits    Exercises     PT Diagnosis:    PT Problem List:   PT Treatment Interventions:     PT Goals    Visit Information  Last PT Received On:  04/17/12 Assistance Needed: +1 PT/OT Co-Evaluation/Treatment: Yes    Subjective Data  Subjective: I've been married 62 years.   Prior Functioning  Home Living Lives With: Spouse;Son Available Help at Discharge: Family;Available 24 hours/day Type of Home: House Home Access: Level entry Home Layout: One level (with basement) Bathroom Shower/Tub: Tub/shower unit;Door [employer] Toilet: Handicapped height Bathroom Accessibility: Yes How Accessible: Accessible via walker Home Adaptive Equipment: Walker - rolling;Shower chair with back Prior Function Level of Independence: Independent Able to Take Stairs?: Yes Driving: Yes Vocation: Retired Musician: No difficulties Dominant Hand: Right    Cognition  Cognition Overall Cognitive Status: Appears within functional limits for tasks assessed/performed Arousal/Alertness: Awake/alert Orientation Level: Appears intact for tasks assessed Behavior During Session: [facility] for tasks performed    Extremity/Trunk Assessment Right Lower Extremity Assessment RLE ROM/Strength/Tone: [facility] for tasks assessed Left Lower Extremity Assessment LLE ROM/Strength/Tone: [facility] for tasks assessed  Pt denies numbness and tingling and states "so far so good" to question if radiating symptoms pre-surgery were present.  Balance    End of Session PT - End of Session Activity Tolerance: Patient tolerated treatment well Patient left: in chair;with call bell/phone within reach  GP     [facility] E 04/17/2012, 11:51 AM Zenovia Jarred, PT, DPT 04/17/2012 Pager: [phone]

## 2012-04-19 ENCOUNTER — Encounter (HOSPITAL_COMMUNITY): Payer: Self-pay | Admitting: Orthopedic Surgery

## 2012-04-23 NOTE — Discharge Summary (Signed)
Physician Discharge Summary   Patient ID: Nathaniel Steele MRN: [id number] DOB/AGE: [birth year] 77 y.o.  Admit date: 04/16/2012 Discharge date: 04/17/2012  Primary Diagnosis: Spinal stenosis, lumbar spine   Admission Diagnoses:  Past Medical History  Diagnosis Date  . Shortness of breath with exertion  . Chronic kidney disease     not on dialysis yet  . Coronary artery disease 05/14/2010    stress test - no scintigraphic evidence of inducible myocardial ischemia,; normal study  . Hypertension   . Arthritis   . Blood dyscrasia     one time had low platlet count  . Dysrhythmia     atrial fib  . SSS (sick sinus syndrome) 08/27/2011    echo EF >55%, severe LAE, moderate RAE, tissue AVR w/ gradients 35 and  . Pericardial effusion 03/12/2011    echo EF 55-60%  . Claudication 02/19/2012    LE doppler no evidence of arterial insufficiency, lower extremities demonstrate normal values  w/ no evidence of insufficiency  . Lupus nephritis   . Pacemaker 09/19/2004    implanted   Discharge Diagnoses:   Active Problems:   Spinal stenosis, lumbar region, with neurogenic claudication  Estimated body mass index is 25.99 kg/(m^2) as calculated from the following:   Height as of this encounter: 5\' 7"  (1.702 m).   Weight as of this encounter: 75.297 kg (166 lb).  Procedure:  Procedure(s) (LRB): DECOMPRESSIVE L4 - L5/ MICRODISCECTOMY ON THE RIGHT 1 LEVEL (Right)   Consults: None  HPI: Nathaniel Steele presents with the chief complaint of bilateral leg pain. He reports that he has had this discomfort intermittently for the past year s but his pain has been much worse over the past 3 months. He has not fallen or injured the back. He has pain in both buttocks that radiates into the posterior thighs, in addition to numbness and tingling in the right calf. He has some pain at all times but it is increased with prolonged sitting or standing and he has difficulty sleeping at night. His wife who is present with him  today says that he stumbles a lot. When asked about this, he says that at times if he does not focus on lifting his right foot, he does not pick it up all the wall when walking. He had back surgery back in 1997 by Dr. Elesa Hacker. He reports no issues other than an occasional back ache until the last few months. He denies groin pain and has had no change in bladder or bowel function. He was treated with a Prednisone pak which helped for a minimal amount of time. CT myelogram revealed severe spinal stenosis in the lumbar spine with disc herniation at L4-L5 on the right.   Laboratory Data: Admission on 04/16/2012, Discharged on 04/17/2012  Component Date Value Range Status  . Prothrombin Time 04/16/2012 16.9* 11.6 - 15.2 seconds Final  . INR 04/16/2012 1.41  0.00 - 1.49 Final  . Sodium 04/17/2012 140  135 - 145 mEq/L Final  . Potassium 04/17/2012 3.9  3.5 - 5.1 mEq/L Final  . Chloride 04/17/2012 104  96 - 112 mEq/L Final  . CO2 04/17/2012 24  19 - 32 mEq/L Final  . Glucose, Bld 04/17/2012 102* 70 - 99 mg/dL Final  . BUN 40/98/1191 42* 6 - 23 mg/dL Final  . Creatinine, Ser 04/17/2012 2.67* 0.50 - 1.35 mg/dL Final  . Calcium 47/82/9562 8.9  8.4 - 10.5 mg/dL Final  . Total Protein 04/17/2012 5.9* 6.0 - 8.3 g/dL  Final  . Albumin 04/17/2012 3.2* 3.5 - 5.2 g/dL Final  . AST 30/86/5784 20  0 - 37 U/L Final  . ALT 04/17/2012 10  0 - 53 U/L Final  . Alkaline Phosphatase 04/17/2012 66  39 - 117 U/L Final  . Total Bilirubin 04/17/2012 0.8  0.3 - 1.2 mg/dL Final  . GFR calc non Af Amer 04/17/2012 21* >90 mL/min Final  . GFR calc Af Amer 04/17/2012 24* >90 mL/min Final   Comment:                                 The eGFR has been calculated                          using the CKD EPI equation.                          This calculation has not been                          validated in all clinical                          situations.                          eGFR's persistently                          <90  mL/min signify                          possible Chronic Kidney Disease.  . WBC 04/17/2012 6.2  4.0 - 10.5 K/uL Final  . RBC 04/17/2012 3.26* 4.22 - 5.81 MIL/uL Final  . Hemoglobin 04/17/2012 9.2* 13.0 - 17.0 g/dL Final  . HCT 69/62/9528 28.8* 39.0 - 52.0 % Final  . MCV 04/17/2012 88.3  78.0 - 100.0 fL Final  . MCH 04/17/2012 28.2  26.0 - 34.0 pg Final  . MCHC 04/17/2012 31.9  30.0 - 36.0 g/dL Final  . RDW 41/32/4401 16.1* 11.5 - 15.5 % Final  . Platelets 04/17/2012 100* 150 - 400 K/uL Final   Comment: PLATELET COUNT CONFIRMED BY SMEAR                          RESULT REPEATED AND VERIFIED                          SPECIMEN CHECKED FOR CLOTS  Hospital Outpatient Visit on 04/06/2012  Component Date Value Range Status  . MRSA, PCR 04/06/2012 NEGATIVE  NEGATIVE Final  . Staphylococcus aureus 04/06/2012 POSITIVE* NEGATIVE Final   Comment:                                 The Xpert SA Assay (FDA                          approved for NASAL specimens  in patients over 33 years of age),                          is one component of                          a comprehensive surveillance                          program.  Test performance has                          been validated by [employer] for patients greater                          than or equal to 60 year old.                          It is not intended                          to diagnose infection nor to                          guide or monitor treatment.  Marland Kitchen aPTT 04/06/2012 55* 24 - 37 seconds Final   Comment:                                 IF BASELINE aPTT IS ELEVATED,                          SUGGEST PATIENT RISK ASSESSMENT                          BE USED TO DETERMINE APPROPRIATE                          ANTICOAGULANT THERAPY.  . Sodium 04/06/2012 137  135 - 145 mEq/L Final  . Potassium 04/06/2012 4.0  3.5 - 5.1 mEq/L Final  . Chloride 04/06/2012 99  96 - 112 mEq/L Final  .  CO2 04/06/2012 27  19 - 32 mEq/L Final  . Glucose, Bld 04/06/2012 80  70 - 99 mg/dL Final  . BUN 11/91/4782 53* 6 - 23 mg/dL Final  . Creatinine, Ser 04/06/2012 3.25* 0.50 - 1.35 mg/dL Final  . Calcium 95/62/1308 9.5  8.4 - 10.5 mg/dL Final  . Total Protein 04/06/2012 6.9  6.0 - 8.3 g/dL Final  . Albumin 65/78/4696 3.8  3.5 - 5.2 g/dL Final  . AST 29/52/8413 18  0 - 37 U/L Final  . ALT 04/06/2012 9  0 - 53 U/L Final  . Alkaline Phosphatase 04/06/2012 81  39 - 117 U/L Final  . Total Bilirubin 04/06/2012 0.5  0.3 - 1.2 mg/dL Final  . GFR calc non Af Amer 04/06/2012 16* >90 mL/min Final  . GFR calc Af Amer 04/06/2012 19* >90 mL/min Final   Comment:  The eGFR has been calculated                          using the CKD EPI equation.                          This calculation has not been                          validated in all clinical                          situations.                          eGFR's persistently                          <90 mL/min signify                          possible Chronic Kidney Disease.  Marland Kitchen Prothrombin Time 04/06/2012 22.5* 11.6 - 15.2 seconds Final  . INR 04/06/2012 2.08* 0.00 - 1.49 Final  . Color, Urine 04/06/2012 YELLOW  YELLOW Final  . APPearance 04/06/2012 CLOUDY* CLEAR Final  . Specific Gravity, Urine 04/06/2012 1.013  1.005 - 1.030 Final  . pH 04/06/2012 7.0  5.0 - 8.0 Final  . Glucose, UA 04/06/2012 NEGATIVE  NEGATIVE mg/dL Final  . Hgb urine dipstick 04/06/2012 NEGATIVE  NEGATIVE Final  . Bilirubin Urine 04/06/2012 NEGATIVE  NEGATIVE Final  . Ketones, ur 04/06/2012 NEGATIVE  NEGATIVE mg/dL Final  . Protein, ur 40/98/1191 NEGATIVE  NEGATIVE mg/dL Final  . Urobilinogen, UA 04/06/2012 0.2  0.0 - 1.0 mg/dL Final  . Nitrite 47/82/9562 NEGATIVE  NEGATIVE Final  . Leukocytes, UA 04/06/2012 NEGATIVE  NEGATIVE Final   MICROSCOPIC NOT DONE ON URINES WITH NEGATIVE PROTEIN, BLOOD, LEUKOCYTES, NITRITE, OR GLUCOSE <1000 mg/dL.  .  WBC 04/06/2012 4.6  4.0 - 10.5 K/uL Final  . RBC 04/06/2012 3.82* 4.22 - 5.81 MIL/uL Final  . Hemoglobin 04/06/2012 10.5* 13.0 - 17.0 g/dL Final  . HCT 13/08/6576 33.6* 39.0 - 52.0 % Final  . MCV 04/06/2012 88.0  78.0 - 100.0 fL Final  . MCH 04/06/2012 27.5  26.0 - 34.0 pg Final  . MCHC 04/06/2012 31.3  30.0 - 36.0 g/dL Final  . RDW 46/96/2952 15.8* 11.5 - 15.5 % Final  . Platelets 04/06/2012 165  150 - 400 K/uL Final     X-Rays:Dg Lumbar Spine 2-3 Views  04/06/2012  *RADIOLOGY REPORT*  Clinical Data: Low back and right leg pain.  Lumbar disc protrusions.  LUMBAR SPINE - 2-3 VIEW  Comparison: CT myelogram dated 03/16/2012  Findings: There are five typical lumbar segments.  Prominent Schmorl's nodes in the superior plates of L3 and L4.  Severe degenerative disc disease at L4-5 with a vacuum phenomenon. Prominent osteophytes throughout the lumbar spine.  IMPRESSION: Multilevel degenerative disc disease.  Vertebral bodies were numbered with the same system as the CT myelogram.   Original Report Authenticated By: Francene Boyers, M.D.    Dg Spine Portable 1 View  04/16/2012  *RADIOLOGY REPORT*  Clinical Data: Decompression at L4-L5.  PORTABLE SPINE - 1 VIEW  Comparison: 04/06/2012  Findings: Single view of the lumbar spine demonstrates a surgical marker along the posterior  aspect of L4-L5.  Disc space disease at L4-L5 and L5-S1.  Compression deformity at L4.  IMPRESSION: Surgical marking at L4-L5.   Original Report Authenticated By: Richarda Overlie, M.D.    Dg Spine Portable 1 View  04/16/2012  *RADIOLOGY REPORT*  Clinical Data: Surgery at L4-L5.  PORTABLE SPINE - 1 VIEW  Comparison: 04/06/2012  Findings: Surgical markers along the posterior aspect of L5 and S1. Again noted is a compression deformity at L4.  Severe disc space disease at L4-L5 and L5-S1.  IMPRESSION: Surgical marking at L5-S1.   Original Report Authenticated By: Richarda Overlie, M.D.    Dg Spine Portable 1 View  04/16/2012  *RADIOLOGY REPORT*   Clinical Data: Decompression at L4-L5.  PORTABLE SPINE - 1 VIEW  Comparison: CT myelogram 03/16/2012  Findings: Single lateral view of the lumbar spine was obtained. There are surgical markers overlying the spinous process of L3 and L4.  Again noted are compression deformities of L3 and L4.  Disc space disease at L4-L5 and L5-S1.  IMPRESSION: Surgical marking at L3 and L4.   Original Report Authenticated By: Richarda Overlie, M.D.     EKG: Orders placed during the hospital encounter of 04/16/12  . EKG 12-LEAD  . EKG 12-LEAD  . EKG     Hospital Course: Nathaniel Steele is a 79 y.o. who was admitted to [facility]. They were brought to the operating room on 04/16/2012 and underwent Procedure(s): DECOMPRESSIVE L4 - L5/ MICRODISCECTOMY ON THE RIGHT 1 LEVEL.  Patient tolerated the procedure well and was later transferred to the recovery room and then to the orthopaedic floor for postoperative care.  They were given PO and IV analgesics for pain control following their surgery.  They were given 24 hours of postoperative antibiotics of  Anti-infectives   Start     Dose/Rate Route Frequency Ordered Stop   04/17/12 0000  clindamycin (CLEOCIN) IVPB 600 mg     600 mg 100 mL/hr over 30 Minutes Intravenous 3 times per day 04/16/12 1917 04/17/12 0630   04/16/12 1607  polymyxin B 500,000 Units, bacitracin 50,000 Units in sodium chloride irrigation 0.9 % 500 mL irrigation  Status:  Discontinued       As needed 04/16/12 1637 04/16/12 1700   04/16/12 1300  clindamycin (CLEOCIN) IVPB 900 mg     900 mg 100 mL/hr over 30 Minutes Intravenous On call to O.R. 04/16/12 1149 04/16/12 1539     and started on DVT prophylaxis in the form of Aspirin.   PT was ordered for gait training and assistance.  Discharge planning consulted to help with postop disposition and equipment needs.  Patient had a good night on the evening of surgery.  Incision was healing well.  Patient was seen in rounds and was ready to go  home.   Discharge Medications: Prior to Admission medications   Medication Sig Start Date End Date Taking? Authorizing Provider  amiodarone (PACERONE) 200 MG tablet Take 200 mg by mouth daily before breakfast.    Yes Historical Provider, MD  amLODipine (NORVASC) 5 MG tablet Take 5 mg by mouth daily before breakfast.    Yes Historical Provider, MD  calcitRIOL (ROCALTROL) 0.25 MCG capsule Take 0.25 mcg by mouth daily before breakfast.    Yes Historical Provider, MD  finasteride (PROSCAR) 5 MG tablet Take 5 mg by mouth daily.   Yes Historical Provider, MD  furosemide (LASIX) 40 MG tablet Take 120 mg by mouth 2 (two) times daily. Takes 3 tablets  Yes Historical Provider, MD  HYDROcodone-acetaminophen (NORCO/VICODIN) 5-325 MG per tablet Take 1 tablet by mouth every 4 (four) hours as needed for pain.   Yes Historical Provider, MD  metoprolol (LOPRESSOR) 50 MG tablet Take 25 mg by mouth daily before breakfast. Takes 1/2 tablet   Yes Historical Provider, MD  Multiple Vitamins-Minerals (OCUVITE PRESERVISION PO) Take 1 tablet by mouth daily.   Yes Historical Provider, MD  rOPINIRole (REQUIP) 0.25 MG tablet Take 0.75 mg by mouth at bedtime.    Yes Historical Provider, MD  aspirin EC 81 MG tablet Take 81 mg by mouth daily.    Historical Provider, MD  methocarbamol (ROBAXIN) 500 MG tablet Take 1 tablet (500 mg total) by mouth every 6 (six) hours as needed. 04/17/12   Senaida Lange, MD  oxyCODONE-acetaminophen (PERCOCET/ROXICET) 5-325 MG per tablet Take 1-2 tablets by mouth every 4 (four) hours as needed for pain. 04/17/12   Senaida Lange, MD  warfarin (COUMADIN) 5 MG tablet Take 2.5-5 mg by mouth daily. Sunday Tuesday and Thursday takes 1/2 tablet and takes a whole tablet all other days of the week    Historical Provider, MD    Diet: Cardiac diet Activity:WBAT Follow-up:in 2 weeks Disposition - Home Discharged Condition: good      Medication List    TAKE these medications       amiodarone 200  MG tablet  Commonly known as:  PACERONE  Take 200 mg by mouth daily before breakfast.     amLODipine 5 MG tablet  Commonly known as:  NORVASC  Take 5 mg by mouth daily before breakfast.     aspirin EC 81 MG tablet  Take 81 mg by mouth daily.     calcitRIOL 0.25 MCG capsule  Commonly known as:  ROCALTROL  Take 0.25 mcg by mouth daily before breakfast.     finasteride 5 MG tablet  Commonly known as:  PROSCAR  Take 5 mg by mouth daily.     furosemide 40 MG tablet  Commonly known as:  LASIX  Take 120 mg by mouth 2 (two) times daily. Takes 3 tablets     HYDROcodone-acetaminophen 5-325 MG per tablet  Commonly known as:  NORCO/VICODIN  Take 1 tablet by mouth every 4 (four) hours as needed for pain.     methocarbamol 500 MG tablet  Commonly known as:  ROBAXIN  Take 1 tablet (500 mg total) by mouth every 6 (six) hours as needed.     metoprolol 50 MG tablet  Commonly known as:  LOPRESSOR  Take 25 mg by mouth daily before breakfast. Takes 1/2 tablet     OCUVITE PRESERVISION PO  Take 1 tablet by mouth daily.     oxyCODONE-acetaminophen 5-325 MG per tablet  Commonly known as:  PERCOCET/ROXICET  Take 1-2 tablets by mouth every 4 (four) hours as needed for pain.     rOPINIRole 0.25 MG tablet  Commonly known as:  REQUIP  Take 0.75 mg by mouth at bedtime.     warfarin 5 MG tablet  Commonly known as:  COUMADIN  Take 2.5-5 mg by mouth daily. Sunday Tuesday and Thursday takes 1/2 tablet and takes a whole tablet all other days of the week         Signed: Leelynn Whetsel LAUREN 04/23/2012, 11:57 AM

## 2012-05-08 ENCOUNTER — Encounter: Payer: Self-pay | Admitting: Pharmacist Clinician (PhC)/ Clinical Pharmacy Specialist

## 2012-05-08 DIAGNOSIS — I480 Paroxysmal atrial fibrillation: Secondary | ICD-10-CM

## 2012-05-08 DIAGNOSIS — Z7901 Long term (current) use of anticoagulants: Secondary | ICD-10-CM

## 2012-05-10 ENCOUNTER — Encounter: Payer: Self-pay | Admitting: *Deleted

## 2012-06-23 ENCOUNTER — Ambulatory Visit (INDEPENDENT_AMBULATORY_CARE_PROVIDER_SITE_OTHER): Payer: Medicare Other | Admitting: Pharmacist Clinician (PhC)/ Clinical Pharmacy Specialist

## 2012-06-23 VITALS — BP 146/80 | HR 88

## 2012-06-23 DIAGNOSIS — Z7901 Long term (current) use of anticoagulants: Secondary | ICD-10-CM

## 2012-06-23 DIAGNOSIS — I4891 Unspecified atrial fibrillation: Secondary | ICD-10-CM

## 2012-06-23 DIAGNOSIS — I480 Paroxysmal atrial fibrillation: Secondary | ICD-10-CM

## 2012-06-23 LAB — POCT INR: INR: 3

## 2012-07-07 ENCOUNTER — Other Ambulatory Visit (HOSPITAL_COMMUNITY): Payer: Self-pay | Admitting: Cardiovascular Disease

## 2012-07-07 DIAGNOSIS — I480 Paroxysmal atrial fibrillation: Secondary | ICD-10-CM

## 2012-07-07 DIAGNOSIS — Z952 Presence of prosthetic heart valve: Secondary | ICD-10-CM

## 2012-07-07 DIAGNOSIS — I442 Atrioventricular block, complete: Secondary | ICD-10-CM

## 2012-07-08 ENCOUNTER — Other Ambulatory Visit: Payer: Self-pay | Admitting: Cardiovascular Disease

## 2012-07-08 LAB — COMPREHENSIVE METABOLIC PANEL
AST: 23 U/L (ref 0–37)
Albumin: 4.1 g/dL (ref 3.5–5.2)
BUN: 49 mg/dL — ABNORMAL HIGH (ref 6–23)
Calcium: 9.5 mg/dL (ref 8.4–10.5)
Chloride: 105 mEq/L (ref 96–112)
Glucose, Bld: 89 mg/dL (ref 70–99)
Potassium: 4.1 mEq/L (ref 3.5–5.3)
Sodium: 143 mEq/L (ref 135–145)
Total Protein: 6.7 g/dL (ref 6.0–8.3)

## 2012-07-08 LAB — TSH: TSH: 2.946 u[IU]/mL (ref 0.350–4.500)

## 2012-07-08 LAB — CBC WITH DIFFERENTIAL/PLATELET
Basophils Absolute: 0 10*3/uL (ref 0.0–0.1)
HCT: 31.2 % — ABNORMAL LOW (ref 39.0–52.0)
Hemoglobin: 10.2 g/dL — ABNORMAL LOW (ref 13.0–17.0)
Lymphocytes Relative: 23 % (ref 12–46)
Monocytes Absolute: 0.6 10*3/uL (ref 0.1–1.0)
Monocytes Relative: 17 % — ABNORMAL HIGH (ref 3–12)
Neutro Abs: 2 10*3/uL (ref 1.7–7.7)
Neutrophils Relative %: 55 % (ref 43–77)
RBC: 3.68 MIL/uL — ABNORMAL LOW (ref 4.22–5.81)
WBC: 3.7 10*3/uL — ABNORMAL LOW (ref 4.0–10.5)

## 2012-07-08 LAB — LIPID PANEL
HDL: 28 mg/dL — ABNORMAL LOW (ref >39)
LDL Cholesterol: 129 mg/dL — ABNORMAL HIGH (ref 0–99)

## 2012-07-12 ENCOUNTER — Encounter: Payer: Self-pay | Admitting: Cardiovascular Disease

## 2012-07-21 ENCOUNTER — Ambulatory Visit (INDEPENDENT_AMBULATORY_CARE_PROVIDER_SITE_OTHER): Payer: Medicare Other | Admitting: Pharmacist Clinician (PhC)/ Clinical Pharmacy Specialist

## 2012-07-21 VITALS — BP 130/76 | HR 92

## 2012-07-21 DIAGNOSIS — I4891 Unspecified atrial fibrillation: Secondary | ICD-10-CM

## 2012-07-21 DIAGNOSIS — Z7901 Long term (current) use of anticoagulants: Secondary | ICD-10-CM

## 2012-07-21 DIAGNOSIS — I480 Paroxysmal atrial fibrillation: Secondary | ICD-10-CM

## 2012-08-24 ENCOUNTER — Ambulatory Visit (HOSPITAL_COMMUNITY)
Admission: RE | Admit: 2012-08-24 | Discharge: 2012-08-24 | Disposition: A | Discharge status: Home / Self Care | Payer: Medicare Other | Source: Ambulatory Visit | Attending: Cardiovascular Disease | Admitting: Cardiovascular Disease

## 2012-08-24 DIAGNOSIS — Z952 Presence of prosthetic heart valve: Secondary | ICD-10-CM

## 2012-08-24 DIAGNOSIS — I379 Nonrheumatic pulmonary valve disorder, unspecified: Secondary | ICD-10-CM | POA: Insufficient documentation

## 2012-08-24 DIAGNOSIS — I480 Paroxysmal atrial fibrillation: Secondary | ICD-10-CM

## 2012-08-24 DIAGNOSIS — I359 Nonrheumatic aortic valve disorder, unspecified: Secondary | ICD-10-CM | POA: Insufficient documentation

## 2012-08-24 DIAGNOSIS — I079 Rheumatic tricuspid valve disease, unspecified: Secondary | ICD-10-CM | POA: Insufficient documentation

## 2012-08-24 DIAGNOSIS — I447 Left bundle-branch block, unspecified: Secondary | ICD-10-CM | POA: Insufficient documentation

## 2012-08-24 DIAGNOSIS — I442 Atrioventricular block, complete: Secondary | ICD-10-CM

## 2012-08-24 DIAGNOSIS — I059 Rheumatic mitral valve disease, unspecified: Secondary | ICD-10-CM | POA: Insufficient documentation

## 2012-08-24 DIAGNOSIS — I2510 Atherosclerotic heart disease of native coronary artery without angina pectoris: Secondary | ICD-10-CM | POA: Insufficient documentation

## 2012-08-24 DIAGNOSIS — Z954 Presence of other heart-valve replacement: Secondary | ICD-10-CM | POA: Insufficient documentation

## 2012-08-24 DIAGNOSIS — I4891 Unspecified atrial fibrillation: Secondary | ICD-10-CM | POA: Insufficient documentation

## 2012-08-24 NOTE — Progress Notes (Signed)
2D Echo Performed 08/24/2012    Rhyatt Muska, RCS  

## 2012-09-01 ENCOUNTER — Ambulatory Visit (INDEPENDENT_AMBULATORY_CARE_PROVIDER_SITE_OTHER): Payer: Medicare Other | Admitting: Pharmacist Clinician (PhC)/ Clinical Pharmacy Specialist

## 2012-09-01 VITALS — BP 128/60 | HR 92

## 2012-09-01 DIAGNOSIS — I4891 Unspecified atrial fibrillation: Secondary | ICD-10-CM

## 2012-09-01 DIAGNOSIS — I480 Paroxysmal atrial fibrillation: Secondary | ICD-10-CM

## 2012-09-01 DIAGNOSIS — Z7901 Long term (current) use of anticoagulants: Secondary | ICD-10-CM

## 2012-09-01 LAB — POCT INR: INR: 2.7

## 2012-10-06 ENCOUNTER — Ambulatory Visit (INDEPENDENT_AMBULATORY_CARE_PROVIDER_SITE_OTHER): Payer: Medicare Other | Admitting: Pharmacist Clinician (PhC)/ Clinical Pharmacy Specialist

## 2012-10-06 VITALS — BP 128/70 | HR 76

## 2012-10-06 DIAGNOSIS — I4891 Unspecified atrial fibrillation: Secondary | ICD-10-CM

## 2012-10-06 DIAGNOSIS — Z7901 Long term (current) use of anticoagulants: Secondary | ICD-10-CM

## 2012-10-06 DIAGNOSIS — I480 Paroxysmal atrial fibrillation: Secondary | ICD-10-CM

## 2012-10-06 LAB — POCT INR: INR: 2.4

## 2012-11-10 ENCOUNTER — Encounter: Payer: Self-pay | Admitting: Cardiovascular Disease

## 2012-11-10 ENCOUNTER — Telehealth (HOSPITAL_COMMUNITY): Payer: Self-pay | Admitting: *Deleted

## 2012-11-10 ENCOUNTER — Ambulatory Visit (INDEPENDENT_AMBULATORY_CARE_PROVIDER_SITE_OTHER): Payer: Medicare Other | Admitting: Pharmacist Clinician (PhC)/ Clinical Pharmacy Specialist

## 2012-11-10 ENCOUNTER — Ambulatory Visit (INDEPENDENT_AMBULATORY_CARE_PROVIDER_SITE_OTHER): Payer: Medicare Other | Admitting: Cardiovascular Disease

## 2012-11-10 VITALS — BP 118/60 | HR 58 | Ht 67.5 in | Wt 168.9 lb

## 2012-11-10 DIAGNOSIS — Z952 Presence of prosthetic heart valve: Secondary | ICD-10-CM

## 2012-11-10 DIAGNOSIS — I4891 Unspecified atrial fibrillation: Secondary | ICD-10-CM

## 2012-11-10 DIAGNOSIS — F17200 Nicotine dependence, unspecified, uncomplicated: Secondary | ICD-10-CM

## 2012-11-10 DIAGNOSIS — Z720 Tobacco use: Secondary | ICD-10-CM

## 2012-11-10 DIAGNOSIS — Z951 Presence of aortocoronary bypass graft: Secondary | ICD-10-CM

## 2012-11-10 DIAGNOSIS — I480 Paroxysmal atrial fibrillation: Secondary | ICD-10-CM

## 2012-11-10 DIAGNOSIS — Z950 Presence of cardiac pacemaker: Secondary | ICD-10-CM

## 2012-11-10 DIAGNOSIS — Z954 Presence of other heart-valve replacement: Secondary | ICD-10-CM

## 2012-11-10 DIAGNOSIS — I2510 Atherosclerotic heart disease of native coronary artery without angina pectoris: Secondary | ICD-10-CM

## 2012-11-10 DIAGNOSIS — Z7901 Long term (current) use of anticoagulants: Secondary | ICD-10-CM

## 2012-11-10 LAB — POCT INR: INR: 2.8

## 2012-11-10 LAB — PACEMAKER DEVICE OBSERVATION

## 2012-11-10 NOTE — Patient Instructions (Signed)
Your physician recommends that you schedule a follow-up appointment in: 3 months (with a pacemaker check at that time). STOP amiodarone. Return for a Coumadin clinic appointment in 3 weeks.

## 2012-11-11 ENCOUNTER — Other Ambulatory Visit (HOSPITAL_COMMUNITY): Payer: Self-pay | Admitting: Cardiovascular Disease

## 2012-11-11 DIAGNOSIS — R0989 Other specified symptoms and signs involving the circulatory and respiratory systems: Secondary | ICD-10-CM

## 2012-11-15 LAB — PACEMAKER DEVICE OBSERVATION
AL AMPLITUDE: 0.3 mv
AL IMPEDENCE PM: 352 Ohm
BAMS-0001: 171 {beats}/min
BATTERY VOLTAGE: 2.86 V
RV LEAD AMPLITUDE: 8.1 mv
RV LEAD IMPEDENCE PM: 352 Ohm
VENTRICULAR PACING PM: 68.6

## 2012-11-17 ENCOUNTER — Ambulatory Visit: Payer: Medicare Other | Admitting: Pharmacist Clinician (PhC)/ Clinical Pharmacy Specialist

## 2012-11-18 ENCOUNTER — Ambulatory Visit (HOSPITAL_COMMUNITY)
Admission: RE | Admit: 2012-11-18 | Discharge: 2012-11-18 | Disposition: A | Discharge status: Home / Self Care | Payer: Medicare Other | Source: Ambulatory Visit | Attending: Cardiovascular Disease | Admitting: Cardiovascular Disease

## 2012-11-18 DIAGNOSIS — R0989 Other specified symptoms and signs involving the circulatory and respiratory systems: Secondary | ICD-10-CM | POA: Insufficient documentation

## 2012-11-18 DIAGNOSIS — I6529 Occlusion and stenosis of unspecified carotid artery: Secondary | ICD-10-CM

## 2012-11-18 NOTE — Progress Notes (Signed)
Carotid Duplex Completed. °Brianna L Mazza,RVT °

## 2012-11-30 DIAGNOSIS — Z720 Tobacco use: Secondary | ICD-10-CM | POA: Insufficient documentation

## 2012-11-30 NOTE — Assessment & Plan Note (Signed)
Recent echocardiogram shows normal prosthetic valve function and normal LV systolic function.

## 2012-11-30 NOTE — Assessment & Plan Note (Addendum)
His dual chamber pacemaker is functioning normally but he is pacing the ventricle 70% of the time and when he has native AV conduction his AV delay is extremely long and about 700 ms. I believe the absence of physiological AV contraction sequence may be just as deleterious as the 30% reduction in ventricular pacing that we are achieving. I think he would better off programmed DDDR with a fixed physiological AV delay. The changes performed today. Continue remote followup every 3 months. Has had several episodes of paroxysmal atrial for relation but these have all been very brief.

## 2012-11-30 NOTE — Progress Notes (Signed)
Patient ID: Nathaniel Steele, male   DOB: [birth year], 79 y.o.   MRN: [id number]      Reason for office visit CAD status post CABG, status post aortic valve replacement, paroxysmal atrial fibrillation Pacemaker check  Nathaniel Steele is 79 years old and underwent aortic valve replacement with a biological prosthesis (23 mm) and coronary artery bypass surgery in fibers 2013. He has recurrent paroxysmal atrial tachycardia and atrial fibrillation for which he takes warfarin anticoagulation as well as amiodarone. A pacemaker was implanted in 2006 (Medtronic enRhythm) for sinus node dysfunction and is still functioning normally although it is approaching ERI. He is not truly pacemaker dependent but the underlying rhythm is severe sinus bradycardia at about 30 beats per minute. His pacemaker shows infrequent and generally brief episodes of atrial fibrillation the longest being approximately 23 minutes.  He has severe chronic kidney disease and is approaching end-stage renal disease. He has a working AV fistula but has not required hemodialysis. His kidney failure is suspected to be secondary to lupus nephritis but lupus has been in remission for several years. He has severe lumbar spine disease and has previously undergone surgery on couple of occasions. He has also had to stop his warfarin intermittently for spinal injections.  He generally feels well. He denies chest pain with activity and has only occasional problems with shortness of breath. He denies edema   Allergies  Allergen Reactions  . Aspirin Hives    Patient states that he can take the enteric coated but not the uncoated  . Feraheme [Ferumoxytol] Rash and Other (See Comments)    Abdominal pain  . Penicillins Hives    Current Outpatient Prescriptions  Medication Sig Dispense Refill  . amiodarone (PACERONE) 200 MG tablet Take 200 mg by mouth daily before breakfast.       . amLODipine (NORVASC) 5 MG tablet Take 5 mg by mouth daily before  breakfast.       . aspirin EC 81 MG tablet Take 81 mg by mouth daily.      . calcitRIOL (ROCALTROL) 0.25 MCG capsule Take 0.25 mcg by mouth daily before breakfast.       . finasteride (PROSCAR) 5 MG tablet Take 5 mg by mouth daily.      . fish oil-omega-3 fatty acids 1000 MG capsule Take 2 g by mouth daily.      . furosemide (LASIX) 40 MG tablet Take 120 mg by mouth 2 (two) times daily. Takes 3 tablets      . HYDROcodone-acetaminophen (NORCO/VICODIN) 5-325 MG per tablet Take 1 tablet by mouth every 4 (four) hours as needed for pain.      . metoprolol (LOPRESSOR) 50 MG tablet Take 25 mg by mouth daily before breakfast.       . Multiple Vitamins-Minerals (OCUVITE PRESERVISION PO) Take 1 tablet by mouth daily.      Marland Kitchen rOPINIRole (REQUIP) 0.25 MG tablet Take 0.75 mg by mouth at bedtime.       Marland Kitchen warfarin (COUMADIN) 5 MG tablet Take 2.5-5 mg by mouth daily. Sunday Tuesday and Thursday takes 1/2 tablet and takes a whole tablet all other days of the week      . [DISCONTINUED] polysaccharide iron (NIFEREX) 150 MG CAPS capsule Take 1 capsule (150 mg total) by mouth daily. For one month then stop.  30 each  0   No current facility-administered medications for this visit.    Past Medical History  Diagnosis Date  . Shortness of breath with exertion  .  Chronic kidney disease     not on dialysis yet  . Coronary artery disease 05/14/2010    stress test - no scintigraphic evidence of inducible myocardial ischemia,; normal study  . Hypertension   . Arthritis   . Blood dyscrasia     one time had low platlet count  . Dysrhythmia     atrial fib  . SSS (sick sinus syndrome) 08/27/2011    echo EF >55%, severe LAE, moderate RAE, tissue AVR w/ gradients 35 and  . Pericardial effusion 03/12/2011    echo EF 55-60%  . Claudication 02/19/2012    LE doppler no evidence of arterial insufficiency, lower extremities demonstrate normal values  w/ no evidence of insufficiency  . Lupus nephritis   . Pacemaker  09/19/2004    implanted    Past Surgical History  Procedure Laterality Date  . Back surgery    . Appendectomy    . Hernia repair    . Pacemaker insertion  2006    Medtronic EnRhythm  . Av fistula placement  02/24/2011  . Aortic valve replacement  02/24/2011    pericardial tissue valve [facility] Ease  . Coronary artery bypass graft  02/24/2011    LIMA to LAD, SVG to distal RCA  . Cardiac catheterization  02/13/2011    mod/severe aortic valve stenosis w peak to peak gradient 25-32 mmHg,  70% stenosis LAD, 30-40%proximal followed by 90% sstenosis in RCA prior to anterior RV margin branch  . Lumbar laminectomy/decompression microdiscectomy Right 04/16/2012    Procedure: DECOMPRESSIVE L4 - L5/ MICRODISCECTOMY ON THE RIGHT 1 LEVEL;  Surgeon: Jacki Cones, MD;  Location: [facility];  Service: Orthopedics;  Laterality: Right;    Family History  Problem Relation Age of Onset  . Hypertension Mother   . Kidney disease Brother     History   Social History  . Marital Status: Married    Spouse Name: N/A    Number of Children: N/A  . Years of Education: N/A   Occupational History  . Not on file.   Social History Main Topics  . Smoking status: Former Smoker    Types: Cigarettes    Quit date: 02/11/1957  . Smokeless tobacco: Not on file  . Alcohol Use: No  . Drug Use: No  . Sexual Activity: Not on file   Other Topics Concern  . Not on file   Social History Narrative  . No narrative on file    Review of systems: The patient specifically denies any chest pain at rest or with exertion, dyspnea at rest or with exertion, orthopnea, paroxysmal nocturnal dyspnea, syncope, palpitations, focal neurological deficits, intermittent claudication, lower extremity edema, unexplained weight gain, cough, hemoptysis or wheezing.  The patient also denies abdominal pain, nausea, vomiting, dysphagia, diarrhea, constipation, polyuria, polydipsia, dysuria, hematuria, frequency, urgency, abnormal  bleeding or bruising, fever, chills, unexpected weight changes, mood swings, change in skin or hair texture, change in voice quality, auditory or visual problems, allergic reactions or rashes, new musculoskeletal complaints other than usual "aches and pains".   PHYSICAL EXAM BP 118/60  Pulse 58  Ht 5' 7.5" (1.715 m)  Wt 168 lb 14.4 oz (76.613 kg)  BMI 26.05 kg/m2  General: Alert, oriented x3, no distress Head: no evidence of trauma, PERRL, EOMI, no exophtalmos or lid lag, no myxedema, no xanthelasma; normal ears, nose and oropharynx Neck: normal jugular venous pulsations and no hepatojugular reflux; brisk carotid pulses without delay and no carotid bruits Chest: clear to auscultation,  no signs of consolidation by percussion or palpation, normal fremitus, symmetrical and full respiratory excursions Cardiovascular: normal position and quality of the apical impulse, irregular rhythm, normal first and second heart sounds, no murmurs, rubs or gallops Abdomen: no tenderness or distention, no masses by palpation, no abnormal pulsatility or arterial bruits, normal bowel sounds, no hepatosplenomegaly Extremities: no clubbing, cyanosis or edema; 2+ radial, ulnar and brachial pulses bilaterally; 2+ right femoral, posterior tibial and dorsalis pedis pulses; 2+ left femoral, posterior tibial and dorsalis pedis pulses; no subclavian or femoral bruits Neurological: grossly nonfocal   EKG: The patient specifically denies any chest pain at rest or with exertion, dyspnea at rest or with exertion, orthopnea, paroxysmal nocturnal dyspnea, syncope, palpitations, focal neurological deficits, intermittent claudication, lower extremity edema, unexplained weight gain, cough, hemoptysis or wheezing.  The patient also denies abdominal pain, nausea, vomiting, dysphagia, diarrhea, constipation, polyuria, polydipsia, dysuria, hematuria, frequency, urgency, abnormal bleeding or bruising, fever, chills, unexpected weight  changes, mood swings, change in skin or hair texture, change in voice quality, auditory or visual problems, allergic reactions or rashes, new musculoskeletal complaints other than usual "aches and pains".  General: Alert, oriented x3, no distress Head: no evidence of trauma, PERRL, EOMI, no exophtalmos or lid lag, no myxedema, no xanthelasma; normal ears, nose and oropharynx Neck: normal jugular venous pulsations and no hepatojugular reflux; brisk carotid pulses without delay and faint bilateral carotid bruits Chest: clear to auscultation, no signs of consolidation by percussion or palpation, normal fremitus, symmetrical and full respiratory excursions; healed sternotomy, healthy right subclavian pacemaker site Cardiovascular: normal position and quality of the apical impulse, regular rhythm, normal first and second heart sounds, no rubs or gallops, early peaking grade 2/6 systolic ejection murmur, no diastolic murmur Abdomen: no tenderness or distention, no masses by palpation, no abnormal pulsatility or arterial bruits, normal bowel sounds, no hepatosplenomegaly Extremities: Large tortuous left forearm AV fistula with excellent thrill and bruit no clubbing, cyanosis or edema; 2+ radial, ulnar and brachial pulses bilaterally; 2+ right femoral, posterior tibial and dorsalis pedis pulses; 2+ left femoral, posterior tibial and dorsalis pedis pulses; no subclavian or femoral bruits Neurological: grossly nonfocal except mild resting tremor   Lipid Panel     Component Value Date/Time   CHOL 190 07/08/2012 0955   TRIG 164* 07/08/2012 0955   HDL 28* 07/08/2012 0955   CHOLHDL 6.8 07/08/2012 0955   VLDL 33 07/08/2012 0955   LDLCALC 129* 07/08/2012 0955    BMET    Component Value Date/Time   NA 143 07/08/2012 0955   K 4.1 07/08/2012 0955   CL 105 07/08/2012 0955   CO2 25 07/08/2012 0955   GLUCOSE 89 07/08/2012 0955   BUN 49* 07/08/2012 0955   CREATININE 3.26* 07/08/2012 0955   CREATININE 2.67* 04/17/2012  0527   CALCIUM 9.5 07/08/2012 0955   GFRNONAA 21* 04/17/2012 0527   GFRAA 24* 04/17/2012 0527     ASSESSMENT AND PLAN Pacemaker, MDT MDT implanted 08/06 His dual chamber pacemaker is functioning normally but he is pacing the ventricle 70% of the time and when he has native AV conduction his AV delay is extremely long and about 700 ms. I believe the absence of physiological AV contraction sequence may be just as deleterious as the 30% reduction in ventricular pacing that we are achieving. I think he would better off programmed DDDR with a fixed physiological AV delay. The changes performed today. Continue remote followup every 3 months. Has had several episodes of paroxysmal atrial for  relation but these have all been very brief.  PAF (paroxysmal atrial fibrillation), on Amiodarone prior to admission    S/P CABG x 2: (LIMA-LAD  SVG - RCA) Asymptomatic  Severe AS, tissue AVR this admission Recent echocardiogram shows normal prosthetic valve function and normal LV systolic function.   Patient Instructions  Your physician recommends that you schedule a follow-up appointment in: 3 months (with a pacemaker check at that time). STOP amiodarone. Return for a Coumadin clinic appointment in 3 weeks.     Orders Placed This Encounter  Procedures  . Pacemaker Device Observation  . EKG 12-Lead   Meds ordered this encounter  Medications  . fish oil-omega-3 fatty acids 1000 MG capsule    Sig: Take 2 g by mouth daily.    Junious Silk, MD, [facility] [phone] office [phone] pager

## 2012-11-30 NOTE — Assessment & Plan Note (Signed)
Asymptomatic. 

## 2012-12-01 ENCOUNTER — Encounter: Payer: Self-pay | Admitting: Neurology

## 2012-12-01 ENCOUNTER — Ambulatory Visit (INDEPENDENT_AMBULATORY_CARE_PROVIDER_SITE_OTHER): Payer: Medicare Other | Admitting: Pharmacist Clinician (PhC)/ Clinical Pharmacy Specialist

## 2012-12-01 ENCOUNTER — Ambulatory Visit (INDEPENDENT_AMBULATORY_CARE_PROVIDER_SITE_OTHER): Payer: Medicare Other | Admitting: Neurology

## 2012-12-01 VITALS — BP 126/74 | HR 73 | Ht 68.5 in | Wt 169.0 lb

## 2012-12-01 VITALS — BP 130/76 | HR 88

## 2012-12-01 DIAGNOSIS — I4891 Unspecified atrial fibrillation: Secondary | ICD-10-CM

## 2012-12-01 DIAGNOSIS — G609 Hereditary and idiopathic neuropathy, unspecified: Secondary | ICD-10-CM | POA: Insufficient documentation

## 2012-12-01 DIAGNOSIS — E611 Iron deficiency: Secondary | ICD-10-CM

## 2012-12-01 DIAGNOSIS — I480 Paroxysmal atrial fibrillation: Secondary | ICD-10-CM

## 2012-12-01 DIAGNOSIS — G2581 Restless legs syndrome: Secondary | ICD-10-CM

## 2012-12-01 DIAGNOSIS — D509 Iron deficiency anemia, unspecified: Secondary | ICD-10-CM

## 2012-12-01 DIAGNOSIS — Z7901 Long term (current) use of anticoagulants: Secondary | ICD-10-CM

## 2012-12-01 LAB — POCT INR: INR: 2.1

## 2012-12-01 NOTE — Progress Notes (Signed)
[facility]    Provider:  Dr Hosie Poisson Referring Provider: Delorse Lek, MD Primary Care Physician:  Delorse Lek, MD  CC:  neuropathy  HPI:  Nathaniel Steele is a 79 y.o. male here as a referral from Dr. Doristine Counter for neuropathy and RLS  Has had RLS symptoms for years, is starting to get worse. In the past would get better with movement but now more continuous. Symptoms are worse during evening hours, when resting and not moving around. Feels good during the daytime when moving. Having difficulty sleeping due to sensation. Feels the sensation on both legs, described as a "crawling sensation". Has urge to move, gets better with movement. Also notes some burning sensation in his distal LE. Recently given Lyrica 50mg  at night time, just started a few days ago. Currently taking Requip 1.5mg  at lunch and 1.5mg  at bedtime. Reports he has been told he has iron deficiency in the past, took supplement and then told to stop.   Did get frost bite from serving over in [place], and told the past this could be contributing to his pain.  No DM. No EtOH or tobacco.   Review of Systems: Out of a complete 14 system review, the patient complains of only the following symptoms, and all other reviewed systems are negative. Positive for pain in legs restless sensation  History   Social History  . Marital Status: Married    Spouse Name: [name]    Number of Children: 1  . Years of Education: 6   Occupational History  . Not on file.   Social History Main Topics  . Smoking status: Former Smoker    Types: Cigarettes    Quit date: 02/11/1957  . Smokeless tobacco: Never Used  . Alcohol Use: No  . Drug Use: No  . Sexual Activity: Not on file   Other Topics Concern  . Not on file   Social History Narrative   Patient is married ([name]).   Patient has one child.   Patient does not drink caffeine.   Patient is right-handed.   Patient is retired.   Patient has a 6th grade education.            Family History  Problem Relation Age of Onset  . Hypertension Mother   . Kidney disease Brother     Past Medical History  Diagnosis Date  . Shortness of breath with exertion  . Chronic kidney disease     not on dialysis yet  . Coronary artery disease 05/14/2010    stress test - no scintigraphic evidence of inducible myocardial ischemia,; normal study  . Hypertension   . Arthritis   . Blood dyscrasia     one time had low platlet count  . Dysrhythmia     atrial fib  . SSS (sick sinus syndrome) 08/27/2011    echo EF >55%, severe LAE, moderate RAE, tissue AVR w/ gradients 35 and  . Pericardial effusion 03/12/2011    echo EF 55-60%  . Claudication 02/19/2012    LE doppler no evidence of arterial insufficiency, lower extremities demonstrate normal values  w/ no evidence of insufficiency  . Lupus nephritis   . Pacemaker 09/19/2004    implanted  . Restless leg syndrome   . Cellulitis   . Anemia   . Benign prostatic hypertrophy   . Constipation   . Shoulder joint pain     Past Surgical History  Procedure Laterality Date  . Back surgery    .  Appendectomy    . Hernia repair    . Pacemaker insertion  2006    Medtronic EnRhythm  . Av fistula placement  02/24/2011  . Aortic valve replacement  02/24/2011    pericardial tissue valve [facility] Ease  . Coronary artery bypass graft  02/24/2011    LIMA to LAD, SVG to distal RCA  . Cardiac catheterization  02/13/2011    mod/severe aortic valve stenosis w peak to peak gradient 25-32 mmHg,  70% stenosis LAD, 30-40%proximal followed by 90% sstenosis in RCA prior to anterior RV margin branch  . Lumbar laminectomy/decompression microdiscectomy Right 04/16/2012    Procedure: DECOMPRESSIVE L4 - L5/ MICRODISCECTOMY ON THE RIGHT 1 LEVEL;  Surgeon: Jacki Cones, MD;  Location: [facility];  Service: Orthopedics;  Laterality: Right;    Current Outpatient Prescriptions  Medication Sig Dispense Refill  . amLODipine (NORVASC) 5 MG  tablet Take 5 mg by mouth daily before breakfast.       . aspirin EC 81 MG tablet Take 81 mg by mouth daily.      . calcitRIOL (ROCALTROL) 0.25 MCG capsule Take 0.25 mcg by mouth daily before breakfast.       . finasteride (PROSCAR) 5 MG tablet Take 5 mg by mouth daily.      . fish oil-omega-3 fatty acids 1000 MG capsule Take 2 g by mouth daily.      . furosemide (LASIX) 40 MG tablet Take 120 mg by mouth 2 (two) times daily. Takes 3 tablets      . metoprolol (LOPRESSOR) 50 MG tablet Take 25 mg by mouth daily before breakfast.       . Multiple Vitamins-Minerals (OCUVITE PRESERVISION PO) Take 1 tablet by mouth daily.      Marland Kitchen rOPINIRole (REQUIP) 0.25 MG tablet Take 0.75 mg by mouth at bedtime.       Marland Kitchen warfarin (COUMADIN) 5 MG tablet Take 2.5-5 mg by mouth daily. Sunday Tuesday and Thursday takes 1/2 tablet and takes a whole tablet all other days of the week      . [DISCONTINUED] polysaccharide iron (NIFEREX) 150 MG CAPS capsule Take 1 capsule (150 mg total) by mouth daily. For one month then stop.  30 each  0   No current facility-administered medications for this visit.    Allergies as of 12/01/2012 - Review Complete 12/01/2012  Allergen Reaction Noted  . Aspirin Hives 01/31/2011  . Feraheme [ferumoxytol] Rash and Other (See Comments) 04/04/2011  . Penicillins Hives 01/31/2011    Vitals: BP 126/74  Pulse 73  Ht 5' 8.5" (1.74 m)  Wt 169 lb (76.658 kg)  BMI 25.32 kg/m2 Last Weight:  Wt Readings from Last 1 Encounters:  12/01/12 169 lb (76.658 kg)   Last Height:   Ht Readings from Last 1 Encounters:  12/01/12 5' 8.5" (1.74 m)     Physical exam: Exam: Gen: NAD, conversant Eyes: anicteric sclerae, moist conjunctivae HENT: Atraumatic, oropharynx clear Neck: Trachea midline; supple,  Lungs: CTA, no wheezing, rales, rhonic                          CV: RRR, no MRG Abdomen: Soft, non-tender;  Extremities: No peripheral edema  Skin: Normal temperature, no rash,  Psych: Appropriate  affect, pleasant  Neuro: MS: AA&Ox3, appropriately interactive, normal affect   Speech: fluent w/o paraphasic error  Memory: good recent and remote recall  CN: PERRL, EOMI no nystagmus, no ptosis, sensation intact to LT V1-V3 bilat,  face symmetric, no weakness, hearing grossly intact, palate elevates symmetrically, shoulder shrug 5/5 bilat,  tongue protrudes midline, no fasiculations noted.  Motor: normal bulk and tone Strength: 5/5  In all extremities  Coord: rapid alternating and point-to-point (FNF, HTS) movements intact. Mild hand postural and intention tremor. No resting tremor noted  Reflexes: symmetrical, bilat downgoing toes  Sens:decreased LT, PP, temp, vibration bilat LE to mid shin  Gait: posture, stance, stride and arm-swing normal. Tandem gait intact. Able to walk on heels and toes. Romberg absent.  Assessment:  After physical and neurologic examination, review of laboratory studies, imaging, neurophysiology testing and pre-existing records, assessment will be reviewed on the problem list.  Plan:  Treatment plan and additional workup will be reviewed under Problem List.  1)Peripheral neuropathy 2)RLS  79y/o gentleman sent for initial evaluation of restless leg syndrome and peripheral neuropathy. Currently taking Requip 1.5 mg twice a day and recently started Lyrica 50 mg nightly. Will check lab workup for causes of restless leg syndrome and peripheral neuropathy. At this time would hesitate to make any medication changes as he recently started Lyrica. Lyrica could potentially beneficial for both the RLS and the neuropathy. If no improvement would consider increasing her Lyrica to 75 mg nightly. Will follow patient once blood work completed.

## 2012-12-01 NOTE — Patient Instructions (Signed)
Overall you are doing fairly well but I do want to suggest a few things today:   Remember to drink plenty of fluid, eat healthy meals and do not skip any meals. Try to eat protein with a every meal and eat a healthy snack such as fruit or nuts in between meals. Try to keep a regular sleep-wake schedule and try to exercise daily, particularly in the form of walking, 20-30 minutes a day, if you can.   As far as your medications are concerned, I would like to suggest continuing on your current regimen. If you do not notice any improvement in 2 weeks then give our office a call.   As far as diagnostic testing: We will check some blood work today.  I would like to see you back in 3 months, sooner if we need to. Please call us with any interim questions, concerns, problems, updates or refill requests.   Please also call us for any test results so we can go over those with you on the phone.  My clinical assistant and will answer any of your questions and relay your messages to me and also relay most of my messages to you.   Our phone number is [phone]. We also have an after hours call service for urgent matters and there is a physician on-call for urgent questions. For any emergencies you know to call 911 or go to the nearest emergency room

## 2012-12-07 LAB — IRON AND TIBC
Iron Saturation: 16 % (ref 15–55)
Iron: 39 ug/dL — ABNORMAL LOW (ref 40–155)
TIBC: 249 ug/dL — ABNORMAL LOW (ref 250–450)
UIBC: 210 ug/dL (ref 150–375)

## 2012-12-07 LAB — PROTEIN ELECTROPHORESIS
A/G Ratio: 1.6 (ref 0.7–2.0)
Alpha 2: 0.6 g/dL (ref 0.4–1.2)
Beta: 0.8 g/dL (ref 0.6–1.3)
Gamma Globulin: 0.9 g/dL (ref 0.5–1.6)
Globulin, Total: 2.5 g/dL (ref 2.0–4.5)
Total Protein: 6.4 g/dL (ref 6.0–8.5)

## 2012-12-07 LAB — FERRITIN: Ferritin: 212 ng/mL (ref 30–400)

## 2012-12-08 ENCOUNTER — Telehealth: Payer: Self-pay | Admitting: Neurology

## 2012-12-08 NOTE — Telephone Encounter (Signed)
Called patient and informed that results has not been reviewed by physician as of yet, patient understood

## 2012-12-09 ENCOUNTER — Telehealth: Payer: Self-pay | Admitting: Neurology

## 2012-12-09 NOTE — Telephone Encounter (Signed)
Message left, patient instructed to call for results.

## 2012-12-09 NOTE — Telephone Encounter (Signed)
Discussed lab results with patients wife. Will start oral iron and B12 supplements.

## 2012-12-14 ENCOUNTER — Telehealth: Payer: Self-pay | Admitting: Internal Medicine

## 2012-12-14 NOTE — Telephone Encounter (Signed)
Pt had appt with dr C 11-10-12/mt

## 2013-01-11 ENCOUNTER — Ambulatory Visit (INDEPENDENT_AMBULATORY_CARE_PROVIDER_SITE_OTHER): Payer: Medicare Other | Admitting: Pharmacist Clinician (PhC)/ Clinical Pharmacy Specialist

## 2013-01-11 VITALS — BP 140/72 | HR 84

## 2013-01-11 DIAGNOSIS — Z7901 Long term (current) use of anticoagulants: Secondary | ICD-10-CM

## 2013-01-11 DIAGNOSIS — I480 Paroxysmal atrial fibrillation: Secondary | ICD-10-CM

## 2013-01-11 DIAGNOSIS — I4891 Unspecified atrial fibrillation: Secondary | ICD-10-CM

## 2013-01-25 ENCOUNTER — Ambulatory Visit (INDEPENDENT_AMBULATORY_CARE_PROVIDER_SITE_OTHER): Payer: Medicare Other | Admitting: Pharmacist Clinician (PhC)/ Clinical Pharmacy Specialist

## 2013-01-25 VITALS — BP 146/80 | HR 80

## 2013-01-25 DIAGNOSIS — Z7901 Long term (current) use of anticoagulants: Secondary | ICD-10-CM

## 2013-01-25 DIAGNOSIS — I4891 Unspecified atrial fibrillation: Secondary | ICD-10-CM

## 2013-01-25 DIAGNOSIS — I480 Paroxysmal atrial fibrillation: Secondary | ICD-10-CM

## 2013-01-25 LAB — POCT INR: INR: 1.1

## 2013-02-08 ENCOUNTER — Ambulatory Visit (INDEPENDENT_AMBULATORY_CARE_PROVIDER_SITE_OTHER): Payer: Medicare Other | Admitting: Pharmacist Clinician (PhC)/ Clinical Pharmacy Specialist

## 2013-02-08 ENCOUNTER — Encounter: Payer: Self-pay | Admitting: Cardiovascular Disease

## 2013-02-08 ENCOUNTER — Ambulatory Visit (INDEPENDENT_AMBULATORY_CARE_PROVIDER_SITE_OTHER): Payer: Medicare Other | Admitting: Cardiovascular Disease

## 2013-02-08 VITALS — BP 110/60 | HR 74 | Ht 67.5 in | Wt 174.2 lb

## 2013-02-08 DIAGNOSIS — I480 Paroxysmal atrial fibrillation: Secondary | ICD-10-CM

## 2013-02-08 DIAGNOSIS — Z951 Presence of aortocoronary bypass graft: Secondary | ICD-10-CM

## 2013-02-08 DIAGNOSIS — Z7901 Long term (current) use of anticoagulants: Secondary | ICD-10-CM

## 2013-02-08 DIAGNOSIS — Z954 Presence of other heart-valve replacement: Secondary | ICD-10-CM

## 2013-02-08 DIAGNOSIS — I4891 Unspecified atrial fibrillation: Secondary | ICD-10-CM

## 2013-02-08 DIAGNOSIS — Z950 Presence of cardiac pacemaker: Secondary | ICD-10-CM

## 2013-02-08 DIAGNOSIS — Z952 Presence of prosthetic heart valve: Secondary | ICD-10-CM

## 2013-02-08 LAB — PACEMAKER DEVICE OBSERVATION

## 2013-02-08 LAB — POCT INR: INR: 1.6

## 2013-02-08 NOTE — Patient Instructions (Signed)
Your physician recommends that you schedule a follow-up appointment for a pacemaker check  in:  2 months with Nathaniel Steele.  Your physician recommends that you schedule a follow-up appointment in: 6 months.

## 2013-02-12 NOTE — Progress Notes (Signed)
Patient ID: Nathaniel Steele, male   DOB: Jun 23, 1926, 80 y.o.   MRN: [id number]     Reason for office visit CAD s/p CABG, AVR, PAF, pacemaker  Nathaniel Steele is 80 years old and underwent aortic valve replacement with a biological prosthesis (23 mm) and coronary artery bypass surgery in 2013. He has recurrent paroxysmal atrial tachycardia and atrial fibrillation for which he takes warfarin anticoagulation. A pacemaker was implanted in 2006 (Medtronic enRhythm) for sinus node dysfunction and is still functioning normally although it is approaching ERI. He is not truly pacemaker dependent but the underlying rhythm is severe sinus bradycardia at about 30 beats per minute. His pacemaker shows infrequent and generally brief episodes of atrial fibrillation the longest being approximately 23 minutes.  He has severe chronic kidney disease and is approaching end-stage renal disease. He has a working AV fistula but has not required hemodialysis. His kidney failure is suspected to be secondary to lupus nephritis but lupus has been in remission for several years. He has severe lumbar spine disease and has previously undergone surgery on couple of occasions. He has also had to stop his warfarin intermittently for spinal injections. Amiodarone was discontinued in November, as it appeared that the arrhythmia was primarily a perioperative phenomenon.  Interrogation of his pacemaker today does show some recurrent episodes of atrial tachycardia and atrial flutter. These all occurred on 12/19/2012 and there has been none in the last 2 months. The longest episode lasted for just under 15 minutes. Otherwise she paces both the atrium and the ventricle 100% at that time. He has very rare escaped beats and practically is pacemaker dependent  Another change that we performed in November of last year was 26 his AV delay since his native PR conduction was extremely long at 700 ms, nonphysiological. The slight reduction in ventricular  pacing that MVP allowed did not appear to be worth it.  He has tolerated the changes in the device programming and the discontinuation of amiodarone without any symptoms   Allergies  Allergen Reactions  . Aspirin Hives    Patient states that he can take the enteric coated but not the uncoated  . Feraheme [Ferumoxytol] Rash and Other (See Comments)    Abdominal pain  . Penicillins Hives    Current Outpatient Prescriptions  Medication Sig Dispense Refill  . amLODipine (NORVASC) 10 MG tablet Take 10 mg by mouth daily.      Marland Kitchen aspirin EC 81 MG tablet Take 81 mg by mouth daily.      . calcitRIOL (ROCALTROL) 0.25 MCG capsule Take 0.25 mcg by mouth daily before breakfast.       . Cyanocobalamin (VITAMIN B 12 PO) Take 1 tablet by mouth daily.      . ferrous sulfate 325 (65 FE) MG tablet Take 325 mg by mouth daily with breakfast.      . finasteride (PROSCAR) 5 MG tablet Take 5 mg by mouth daily.      . fish oil-omega-3 fatty acids 1000 MG capsule Take 2 g by mouth daily.      . furosemide (LASIX) 40 MG tablet Take 120 mg by mouth 2 (two) times daily. Takes 3 tablets      . metoprolol (LOPRESSOR) 50 MG tablet Take 25 mg by mouth daily before breakfast.       . Multiple Vitamins-Minerals (OCUVITE PRESERVISION PO) Take 1 tablet by mouth daily.      . polyethylene glycol (MIRALAX / GLYCOLAX) packet Take 17 g by mouth  daily.      . pregabalin (LYRICA) 50 MG capsule Take 50 mg by mouth daily.      Marland Kitchen rOPINIRole (REQUIP) 0.25 MG tablet Take 0.75 mg by mouth at bedtime.       Marland Kitchen warfarin (COUMADIN) 5 MG tablet Take 2.5-5 mg by mouth daily. Sunday Tuesday and Thursday takes 1/2 tablet and takes a whole tablet all other days of the week      . [DISCONTINUED] polysaccharide iron (NIFEREX) 150 MG CAPS capsule Take 1 capsule (150 mg total) by mouth daily. For one month then stop.  30 each  0   No current facility-administered medications for this visit.    Past Medical History  Diagnosis Date  .  Shortness of breath with exertion  . Chronic kidney disease     not on dialysis yet  . Coronary artery disease 05/14/2010    stress test - no scintigraphic evidence of inducible myocardial ischemia,; normal study  . Hypertension   . Arthritis   . Blood dyscrasia     one time had low platlet count  . Dysrhythmia     atrial fib  . SSS (sick sinus syndrome) 08/27/2011    echo EF >55%, severe LAE, moderate RAE, tissue AVR w/ gradients 35 and  . Pericardial effusion 03/12/2011    echo EF 55-60%  . Claudication 02/19/2012    LE doppler no evidence of arterial insufficiency, lower extremities demonstrate normal values  w/ no evidence of insufficiency  . Lupus nephritis   . Pacemaker 09/19/2004    implanted  . Restless leg syndrome   . Cellulitis   . Anemia   . Benign prostatic hypertrophy   . Constipation   . Shoulder joint pain     Past Surgical History  Procedure Laterality Date  . Back surgery    . Appendectomy    . Hernia repair    . Pacemaker insertion  2006    Medtronic EnRhythm  . Av fistula placement  02/24/2011  . Aortic valve replacement  02/24/2011    pericardial tissue valve [facility] Ease  . Coronary artery bypass graft  02/24/2011    LIMA to LAD, SVG to distal RCA  . Cardiac catheterization  02/13/2011    mod/severe aortic valve stenosis w peak to peak gradient 25-32 mmHg,  70% stenosis LAD, 30-40%proximal followed by 90% sstenosis in RCA prior to anterior RV margin branch  . Lumbar laminectomy/decompression microdiscectomy Right 04/16/2012    Procedure: DECOMPRESSIVE L4 - L5/ MICRODISCECTOMY ON THE RIGHT 1 LEVEL;  Surgeon: Jacki Cones, MD;  Location: [facility];  Service: Orthopedics;  Laterality: Right;    Family History  Problem Relation Age of Onset  . Hypertension Mother   . Kidney disease Brother     History   Social History  . Marital Status: Married    Spouse Name: Nathaniel Steele    Number of Children: 1  . Years of Education: 6   Occupational History    . Not on file.   Social History Main Topics  . Smoking status: Former Smoker    Types: Cigarettes    Quit date: 02/11/1957  . Smokeless tobacco: Never Used  . Alcohol Use: No  . Drug Use: No  . Sexual Activity: Not on file   Other Topics Concern  . Not on file   Social History Narrative   Patient is married (Nathaniel Steele).   Patient has one child.   Patient does not drink caffeine.   Patient  is right-handed.   Patient is retired.   Patient has a 6th grade education.          Review of systems: The patient specifically denies any chest pain at rest or with exertion, dyspnea at rest or with exertion, orthopnea, paroxysmal nocturnal dyspnea, syncope, palpitations, focal neurological deficits, intermittent claudication, lower extremity edema, unexplained weight gain, cough, hemoptysis or wheezing.  The patient also denies abdominal pain, nausea, vomiting, dysphagia, diarrhea, constipation, polyuria, polydipsia, dysuria, hematuria, frequency, urgency, abnormal bleeding or bruising, fever, chills, unexpected weight changes, mood swings, change in skin or hair texture, change in voice quality, auditory or visual problems, allergic reactions or rashes, new musculoskeletal complaints other than usual "aches and pains".    PHYSICAL EXAM BP 110/60  Pulse 74  Ht 5' 7.5" (1.715 m)  Wt 79.017 kg (174 lb 3.2 oz)  BMI 26.87 kg/m2 General: Alert, oriented x3, no distress  Head: no evidence of trauma, PERRL, EOMI, no exophtalmos or lid lag, no myxedema, no xanthelasma; normal ears, nose and oropharynx  Neck: normal jugular venous pulsations and no hepatojugular reflux; brisk carotid pulses without delay and faint bilateral carotid bruits  Chest: clear to auscultation, no signs of consolidation by percussion or palpation, normal fremitus, symmetrical and full respiratory excursions; healed sternotomy, healthy right subclavian pacemaker site  Cardiovascular: normal position and quality of the apical  impulse, regular rhythm, normal first and second heart sounds, no rubs or gallops, early peaking grade 2/6 systolic ejection murmur, no diastolic murmur  Abdomen: no tenderness or distention, no masses by palpation, no abnormal pulsatility or arterial bruits, normal bowel sounds, no hepatosplenomegaly  Extremities: Large tortuous left forearm AV fistula with excellent thrill and bruit  no clubbing, cyanosis or edema; 2+ radial, ulnar and brachial pulses bilaterally; 2+ right femoral, posterior tibial and dorsalis pedis pulses; 2+ left femoral, posterior tibial and dorsalis pedis pulses; no subclavian or femoral bruits  Neurological: grossly nonfocal except mild resting tremor  EKG: AV sequential paced  Lipid Panel     Component Value Date/Time   CHOL 190 07/08/2012 0955   TRIG 164* 07/08/2012 0955   HDL 28* 07/08/2012 0955   CHOLHDL 6.8 07/08/2012 0955   VLDL 33 07/08/2012 0955   LDLCALC 129* 07/08/2012 0955    BMET    Component Value Date/Time   NA 143 07/08/2012 0955   K 4.1 07/08/2012 0955   CL 105 07/08/2012 0955   CO2 25 07/08/2012 0955   GLUCOSE 89 07/08/2012 0955   BUN 49* 07/08/2012 0955   CREATININE 3.26* 07/08/2012 0955   CREATININE 2.67* 04/17/2012 0527   CALCIUM 9.5 07/08/2012 0955   GFRNONAA 21* 04/17/2012 0527   GFRAA 24* 04/17/2012 0527     ASSESSMENT AND PLAN PAF (paroxysmal atrial fibrillation), on Amiodarone prior to admission So far discontinuation of the amiodarone has not led to any symptoms or any significant increase in arrhythmia burden. The expected increase in warfarin dosage has been dealt with.  Pacemaker, MDT MDT implanted 08/06 Normal device function today, but the battery is approaching elective replacement indicator. In addition his particular pacemaker model is known to have occasional rapid battery depletion. Increase device monitoring to every 2 months. Despite reports that he was unable to tolerate rate response in the past, he has done well with this since  his last appointment. Atrial lead threshold is 1 V at 0.6 ms which is acceptable, but the ventricular lead threshold is now 1.5 V at 0.9 ms pulse width. We'll have to  discuss implantation of a new lead at the time of pacemaker generator change out. I would only consider this if his subclavian vein is still patent. We do not want to compromise the contralateral venous system where he has his dialysis fistula.  S/P CABG x 2: (LIMA-LAD  SVG - RCA) Asymptomatic  AVR 23 mm bioprosthesis    Need to review why he is not on a statin   Meds ordered this encounter  Medications  . amLODipine (NORVASC) 10 MG tablet    Sig: Take 10 mg by mouth daily.  . pregabalin (LYRICA) 50 MG capsule    Sig: Take 50 mg by mouth daily.  . polyethylene glycol (MIRALAX / GLYCOLAX) packet    Sig: Take 17 g by mouth daily.    Junious SilkROITORU,Londell Noll  Kynzleigh Bandel, MD, [facility] [phone] office [phone] pager

## 2013-02-12 NOTE — Assessment & Plan Note (Signed)
Asymptomatic. 

## 2013-02-12 NOTE — Assessment & Plan Note (Signed)
Normal device function today, but the battery is approaching elective replacement indicator. In addition his particular pacemaker model is known to have occasional rapid battery depletion. Increase device monitoring to every 2 months. Despite reports that he was unable to tolerate rate response in the past, he has done well with this since his last appointment. Atrial lead threshold is 1 V at 0.6 ms which is acceptable, but the ventricular lead threshold is now 1.5 V at 0.9 ms pulse width. We'll have to discuss implantation of a new lead at the time of pacemaker generator change out. I would only consider this if his subclavian vein is still patent. We do not want to compromise the contralateral venous system where he has his dialysis fistula.

## 2013-02-12 NOTE — Assessment & Plan Note (Signed)
So far discontinuation of the amiodarone has not led to any symptoms or any significant increase in arrhythmia burden. The expected increase in warfarin dosage has been dealt with.

## 2013-02-18 LAB — MDC_IDC_ENUM_SESS_TYPE_INCLINIC
Brady Statistic RA Percent Paced: 100 %
Lead Channel Impedance Value: 340 Ohm
Lead Channel Impedance Value: 344 Ohm
Lead Channel Pacing Threshold Amplitude: 1.5 V
Lead Channel Pacing Threshold Pulse Width: 0.6 ms
Lead Channel Setting Pacing Amplitude: 2.5 V
Lead Channel Setting Pacing Amplitude: 2.5 V
Lead Channel Setting Pacing Pulse Width: 0.8 ms
Lead Channel Setting Sensing Sensitivity: 0.9 mV
MDC IDC MSMT BATTERY VOLTAGE: 2.85 V
MDC IDC MSMT LEADCHNL RA PACING THRESHOLD AMPLITUDE: 1 V
MDC IDC MSMT LEADCHNL RV PACING THRESHOLD PULSEWIDTH: 0.9 ms
MDC IDC STAT BRADY RV PERCENT PACED: 100 %

## 2013-02-22 ENCOUNTER — Ambulatory Visit (INDEPENDENT_AMBULATORY_CARE_PROVIDER_SITE_OTHER): Payer: Medicare Other | Admitting: Pharmacist Clinician (PhC)/ Clinical Pharmacy Specialist

## 2013-02-22 ENCOUNTER — Telehealth: Payer: Self-pay | Admitting: *Deleted

## 2013-02-22 VITALS — BP 120/72 | HR 88

## 2013-02-22 DIAGNOSIS — Z7901 Long term (current) use of anticoagulants: Secondary | ICD-10-CM

## 2013-02-22 DIAGNOSIS — I4891 Unspecified atrial fibrillation: Secondary | ICD-10-CM

## 2013-02-22 DIAGNOSIS — I480 Paroxysmal atrial fibrillation: Secondary | ICD-10-CM

## 2013-02-22 LAB — POCT INR: INR: 2.1

## 2013-02-22 NOTE — Telephone Encounter (Signed)
Statins added to patients allergy list.

## 2013-03-07 ENCOUNTER — Ambulatory Visit (INDEPENDENT_AMBULATORY_CARE_PROVIDER_SITE_OTHER): Payer: Medicare Other | Admitting: Neurology

## 2013-03-07 ENCOUNTER — Encounter: Payer: Self-pay | Admitting: Neurology

## 2013-03-07 VITALS — BP 120/72 | HR 91 | Ht 67.5 in | Wt 175.0 lb

## 2013-03-07 DIAGNOSIS — G609 Hereditary and idiopathic neuropathy, unspecified: Secondary | ICD-10-CM

## 2013-03-07 DIAGNOSIS — G2581 Restless legs syndrome: Secondary | ICD-10-CM

## 2013-03-07 MED ORDER — GABAPENTIN 100 MG PO CAPS: 100.0000 mg | ORAL_CAPSULE | Freq: Three times a day (TID) | ORAL | Status: DC

## 2013-03-07 MED ORDER — ROPINIROLE HCL 1 MG PO TABS: 1.0000 mg | ORAL_TABLET | Freq: Every day | ORAL | Status: DC

## 2013-03-07 NOTE — Progress Notes (Signed)
[facility]    Provider:  Dr Hosie PoissonSumner Referring Provider: Delorse LekBurnett, Brent A, MD Primary Care Physician:  Delorse LekBURNETT,BRENT A, MD  CC:  neuropathy  HPI:  Nathaniel Steele is a 80 y.o. male here as a follow up from Dr. Doristine CounterBurnett for neuropathy and RLS. Last visit was 11/2012 at which time he was instructed to start iron and B12 supplement and continue on Lyrica and Requip. Since last visit he notes continued LE discomfort involving both legs. These symptoms occur predominantly at rest and are relieved with movement. Describes an urge to move and discomfort in his legs until he does move. Does not notice any improvement with addition of Lyrica. Continues to take B12 and iron supplementation.    Initial visit 11/2012: Has had RLS symptoms for years, is starting to get worse. In the past would get better with movement but now more continuous. Symptoms are worse during evening hours, when resting and not moving around. Feels good during the daytime when moving. Having difficulty sleeping due to sensation. Feels the sensation on both legs, described as a "crawling sensation". Has urge to move, gets better with movement. Also notes some burning sensation in his distal LE. Recently given Lyrica 50mg  at night time, just started a few days ago. Currently taking Requip 1.5mg  at lunch and 1.5mg  at bedtime. Reports he has been told he has iron deficiency in the past, took supplement and then told to stop.   [place] and told the past this could be contributing to his pain.  No DM. No EtOH or tobacco.   Review of Systems: Out of a complete 14 system review, the patient complains of only the following symptoms, and all other reviewed systems are negative. Positive for tremors back pain restless legs leg swelling  History   Social History  . Marital Status: Married    Spouse Name: Nathaniel Steele    Number of Children: 1  . Years of Education: 6   Occupational  History  . Not on file.   Social History Main Topics  . Smoking status: Former Smoker    Types: Cigarettes    Quit date: 02/11/1957  . Smokeless tobacco: Never Used  . Alcohol Use: No  . Drug Use: No  . Sexual Activity: Not on file   Other Topics Concern  . Not on file   Social History Narrative   Patient is married (Nathaniel Steele).   Patient has one child.   Patient does not drink caffeine.   Patient is right-handed.   Patient is retired.   Patient has a 6th grade education.          Family History  Problem Relation Age of Onset  . Hypertension Mother   . Kidney disease Brother     Past Medical History  Diagnosis Date  . Shortness of breath with exertion  . Chronic kidney disease     not on dialysis yet  . Coronary artery disease 05/14/2010    stress test - no scintigraphic evidence of inducible myocardial ischemia,; normal study  . Hypertension   . Arthritis   . Blood dyscrasia     one time had low platlet count  . Dysrhythmia     atrial fib  . SSS (sick sinus syndrome) 08/27/2011    echo EF >55%, severe LAE, moderate RAE, tissue AVR w/ gradients 35 and 17mmHg  . Pericardial effusion 03/12/2011    echo EF 55-60%  . Claudication 02/19/2012  LE doppler no evidence of arterial insufficiency, lower extremities demonstrate normal values  w/ no evidence of insufficiency  . Lupus nephritis   . Pacemaker 09/19/2004    implanted  . Restless leg syndrome   . Cellulitis   . Anemia   . Benign prostatic hypertrophy   . Constipation   . Shoulder joint pain     Past Surgical History  Procedure Laterality Date  . Back surgery    . Appendectomy    . Hernia repair    . Pacemaker insertion  2006    Medtronic EnRhythm  . Av fistula placement  02/24/2011  . Aortic valve replacement  02/24/2011    pericardial tissue valve [facility] Ease  . Coronary artery bypass graft  02/24/2011    LIMA to LAD, SVG to distal RCA  . Cardiac catheterization  02/13/2011    mod/severe aortic  valve stenosis w peak to peak gradient 25-32 mmHg,  70% stenosis LAD, 30-40%proximal followed by 90% sstenosis in RCA prior to anterior RV margin branch  . Lumbar laminectomy/decompression microdiscectomy Right 04/16/2012    Procedure: DECOMPRESSIVE L4 - L5/ MICRODISCECTOMY ON THE RIGHT 1 LEVEL;  Surgeon: Jacki Cones, MD;  Location: [facility];  Service: Orthopedics;  Laterality: Right;    Current Outpatient Prescriptions  Medication Sig Dispense Refill  . amLODipine (NORVASC) 10 MG tablet Take 10 mg by mouth daily.      Marland Kitchen aspirin EC 81 MG tablet Take 81 mg by mouth daily.      . calcitRIOL (ROCALTROL) 0.25 MCG capsule Take 0.25 mcg by mouth daily before breakfast.       . Cyanocobalamin (VITAMIN B 12 PO) Take 1 tablet by mouth daily.      . ferrous sulfate 325 (65 FE) MG tablet Take 325 mg by mouth daily with breakfast.      . finasteride (PROSCAR) 5 MG tablet Take 5 mg by mouth daily.      . fish oil-omega-3 fatty acids 1000 MG capsule Take 2 g by mouth daily.      . furosemide (LASIX) 40 MG tablet Take 120 mg by mouth 2 (two) times daily. Takes 3 tablets      . metoprolol (LOPRESSOR) 50 MG tablet Take 25 mg by mouth daily before breakfast.       . Multiple Vitamins-Minerals (OCUVITE PRESERVISION PO) Take 1 tablet by mouth daily.      . polyethylene glycol (MIRALAX / GLYCOLAX) packet Take 17 g by mouth daily.      . pregabalin (LYRICA) 50 MG capsule Take 50 mg by mouth daily.      Marland Kitchen rOPINIRole (REQUIP) 0.25 MG tablet Take 0.75 mg by mouth at bedtime.       Marland Kitchen warfarin (COUMADIN) 5 MG tablet Take 2.5-5 mg by mouth daily. Sunday Tuesday and Thursday takes 1/2 tablet and takes a whole tablet all other days of the week      . [DISCONTINUED] polysaccharide iron (NIFEREX) 150 MG CAPS capsule Take 1 capsule (150 mg total) by mouth daily. For one month then stop.  30 each  0   No current facility-administered medications for this visit.    Allergies as of 03/07/2013 - Review Complete 03/07/2013    Allergen Reaction Noted  . Statins  02/22/2013  . Aspirin Hives 01/31/2011  . Feraheme [ferumoxytol] Rash and Other (See Comments) 04/04/2011  . Penicillins Hives 01/31/2011    Vitals: BP 120/72  Pulse 91  Ht 5' 7.5" (1.715 m)  Wt  175 lb (79.379 kg)  BMI 26.99 kg/m2 Last Weight:  Wt Readings from Last 1 Encounters:  03/07/13 175 lb (79.379 kg)   Last Height:   Ht Readings from Last 1 Encounters:  03/07/13 5' 7.5" (1.715 m)     Physical exam: Exam: Gen: NAD, conversant Eyes: anicteric sclerae, moist conjunctivae HENT: Atraumatic, oropharynx clear Neck: Trachea midline; supple,  Lungs: CTA, no wheezing, rales, rhonic                          CV: RRR, no MRG Abdomen: Soft, non-tender;  Extremities: 1+ pitting edema bilateral LE Skin: Normal temperature, no rash,  Psych: Appropriate affect, pleasant  Neuro: MS: AA&Ox3, appropriately interactive, normal affect   Speech: fluent w/o paraphasic error  Memory: good recent and remote recall  CN: PERRL, EOMI no nystagmus, no ptosis, sensation intact to LT V1-V3 bilat, face symmetric, no weakness, hearing grossly intact, palate elevates symmetrically, shoulder shrug 5/5 bilat,  tongue protrudes midline, no fasiculations noted.  Motor: normal bulk and tone Strength: 5/5  In all extremities  Coord: rapid alternating and point-to-point (FNF, HTS) movements intact. Mild hand postural and intention tremor. No resting tremor noted  Reflexes: symmetrical, bilat downgoing toes  Sens:decreased LT, PP, temp, vibration bilat LE to mid shin  Gait: posture, stance, stride and arm-swing normal. Tandem gait intact. Able to walk on heels and toes. Romberg absent.  Assessment:  After physical and neurologic examination, review of laboratory studies, imaging, neurophysiology testing and pre-existing records, assessment will be reviewed on the problem list.  Plan:  Treatment plan and additional workup will be reviewed under Problem  List.  1)Peripheral neuropathy 2)RLS  80y/o gentleman sent for follow up evaluation of restless leg syndrome and peripheral neuropathy. Currently taking Requip 0.75 mg once a day and  Lyrica 50 mg nightly. Continues to have breakthrough symptoms on this regimen. Due to lack of efficacy and side effects will discontinue Lyrica. Will start gabapentin 100mg  qhs (dose limited due to renal function) and increase Requip to 1mg  nightly. Follow up in 4 months.

## 2013-03-07 NOTE — Patient Instructions (Signed)
Overall you are doing fairly well but I do want to suggest a few things today:   Remember to drink plenty of fluid, eat healthy meals and do not skip any meals. Try to eat protein with a every meal and eat a healthy snack such as fruit or nuts in between meals. Try to keep a regular sleep-wake schedule and try to exercise daily, particularly in the form of walking, 20-30 minutes a day, if you can.   As far as your medications are concerned, I would like to suggest the following: 1)Discontinue the Lyrica 2)Start Gabapentin 100mg  nightly 3)Increase the Requip to 1mg  nightly  I would like to see you back in 4 months, sooner if we need to. Please call us with any interim questions, concerns, problems, updates or refill requests.   My clinical assistant and will answer any of your questions and relay your messages to me and also relay most of my messages to you.   Our phone number is [phone]. We also have an after hours call service for urgent matters and there is a physician on-call for urgent questions. For any emergencies you know to call 911 or go to the nearest emergency room

## 2013-03-22 ENCOUNTER — Ambulatory Visit (INDEPENDENT_AMBULATORY_CARE_PROVIDER_SITE_OTHER): Payer: Medicare Other | Admitting: Pharmacist Clinician (PhC)/ Clinical Pharmacy Specialist

## 2013-03-22 VITALS — BP 108/56 | HR 92

## 2013-03-22 DIAGNOSIS — I4891 Unspecified atrial fibrillation: Secondary | ICD-10-CM

## 2013-03-22 DIAGNOSIS — Z7901 Long term (current) use of anticoagulants: Secondary | ICD-10-CM

## 2013-03-22 DIAGNOSIS — I480 Paroxysmal atrial fibrillation: Secondary | ICD-10-CM

## 2013-03-22 LAB — POCT INR: INR: 1.5

## 2013-04-07 ENCOUNTER — Other Ambulatory Visit: Payer: Self-pay | Admitting: Pharmacist Clinician (PhC)/ Clinical Pharmacy Specialist

## 2013-04-07 MED ORDER — WARFARIN SODIUM 5 MG PO TABS: ORAL_TABLET | ORAL | Status: DC

## 2013-04-18 ENCOUNTER — Encounter: Payer: Self-pay | Admitting: Cardiovascular Disease

## 2013-04-18 ENCOUNTER — Ambulatory Visit (INDEPENDENT_AMBULATORY_CARE_PROVIDER_SITE_OTHER): Payer: Medicare Other | Admitting: Pharmacist Clinician (PhC)/ Clinical Pharmacy Specialist

## 2013-04-18 ENCOUNTER — Ambulatory Visit (INDEPENDENT_AMBULATORY_CARE_PROVIDER_SITE_OTHER): Payer: Medicare Other | Admitting: Cardiovascular Disease

## 2013-04-18 VITALS — BP 118/60 | HR 80 | Resp 16 | Ht 67.5 in | Wt 169.9 lb

## 2013-04-18 DIAGNOSIS — R0789 Other chest pain: Secondary | ICD-10-CM

## 2013-04-18 DIAGNOSIS — Z7901 Long term (current) use of anticoagulants: Secondary | ICD-10-CM

## 2013-04-18 DIAGNOSIS — Z952 Presence of prosthetic heart valve: Secondary | ICD-10-CM

## 2013-04-18 DIAGNOSIS — Z951 Presence of aortocoronary bypass graft: Secondary | ICD-10-CM

## 2013-04-18 DIAGNOSIS — I480 Paroxysmal atrial fibrillation: Secondary | ICD-10-CM

## 2013-04-18 DIAGNOSIS — Z950 Presence of cardiac pacemaker: Secondary | ICD-10-CM

## 2013-04-18 DIAGNOSIS — I4891 Unspecified atrial fibrillation: Secondary | ICD-10-CM

## 2013-04-18 DIAGNOSIS — Z954 Presence of other heart-valve replacement: Secondary | ICD-10-CM

## 2013-04-18 LAB — PACEMAKER DEVICE OBSERVATION

## 2013-04-18 LAB — POCT INR: INR: 1.9

## 2013-04-18 MED ORDER — AMIODARONE HCL 200 MG PO TABS: 200.0000 mg | ORAL_TABLET | Freq: Every day | ORAL | Status: DC

## 2013-04-18 NOTE — Patient Instructions (Signed)
Remote monitoring is used to monitor your Pacemaker of ICD from home. This monitoring reduces the number of office visits required to check your device to one time per year. It allows us to keep an eye on the functioning of your device to ensure it is working properly. You are scheduled for a device check from home on April. You may send your transmission at any time that day. If you have a wireless device, the transmission will be sent automatically. After your physician reviews your transmission, you will receive a postcard with your next transmission date.  Your physician recommends that you schedule a follow-up appointment in: 3 MONTHS  Your physician has recommended you make the following change in your medication: Start Amiodarone 200 mg daily

## 2013-04-20 ENCOUNTER — Encounter: Payer: Self-pay | Admitting: Cardiovascular Disease

## 2013-04-20 NOTE — Progress Notes (Signed)
Patient ID: Nathaniel Steele, male   DOB: 1927-01-15, 80 y.o.   MRN: [id number]     Reason for office visit CAD status post CABG, aortic valve replacement, paroxysmal atrial fibrillation, pacemaker  Nathaniel Steele is 80 years old and underwent aortic valve replacement with a biological prosthesis (23 mm) and coronary artery bypass surgery in 2013. He has recurrent paroxysmal atrial tachycardia and atrial fibrillation for which he takes warfarin anticoagulation. A pacemaker was implanted in 2006 (Medtronic enRhythm) for sinus node dysfunction and is still functioning normally although it is approaching ERI. He is not truly pacemaker dependent but the underlying rhythm is severe sinus bradycardia at about 30 beats per minute.  He has severe chronic kidney disease and is approaching end-stage renal disease. He has a working AV fistula but has not required hemodialysis. His kidney failure is suspected to be secondary to lupus nephritis but lupus has been in remission for several years. He has severe lumbar spine disease and has previously undergone surgery on couple of occasions. He has also had to stop his warfarin intermittently for spinal injections.   At his last several pacemaker checks atrial for relation was found to be very infrequent and most of the record episodes were atrial tachycardia. Amiodarone was discontinued in November, as it appeared that the arrhythmia was primarily a perioperative phenomenon. I was also concerned about the fact that the frequency of ventricular pacing had gradually increased and was essentially 100%.   Interrogation of his pacemaker today shows that there is a marked increase in frequency and duration of episodes of atrial fibrillation starting in late January. The current atrial fibrillation burden is 13.5% up from essentially 0. Episodes of atrial fibrillation of up to 3 hours in duration have been recorded as recently as yesterday. This correlates well with the expected  gradual resolution of amiodarone effect. Despite this there has been no reduction in the frequency of ventricular pacing: it remains 100%.   He remains asymptomatic and is oblivious to the arrhythmia. He is appropriately anticoagulated with warfarin. There have been no high ventricular rates.    Allergies  Allergen Reactions  . Statins     myalgias  . Aspirin Hives    Patient states that he can take the enteric coated but not the uncoated  . Feraheme [Ferumoxytol] Rash and Other (See Comments)    Abdominal pain  . Penicillins Hives    Current Outpatient Prescriptions  Medication Sig Dispense Refill  . amLODipine (NORVASC) 10 MG tablet Take 10 mg by mouth daily.      Marland Kitchen aspirin EC 81 MG tablet Take 81 mg by mouth daily.      . calcitRIOL (ROCALTROL) 0.25 MCG capsule Take 0.25 mcg by mouth daily before breakfast.       . Cyanocobalamin (VITAMIN B 12 PO) Take 1 tablet by mouth daily.      . ferrous sulfate 325 (65 FE) MG tablet Take 325 mg by mouth daily with breakfast.      . finasteride (PROSCAR) 5 MG tablet Take 5 mg by mouth daily.      . fish oil-omega-3 fatty acids 1000 MG capsule Take 2 g by mouth daily.      . furosemide (LASIX) 40 MG tablet Take 120 mg by mouth 2 (two) times daily. Takes 3 tablets      . metoprolol (LOPRESSOR) 50 MG tablet Take 25 mg by mouth daily before breakfast.       . Multiple Vitamins-Minerals (OCUVITE PRESERVISION PO)  Take 1 tablet by mouth daily.      . polyethylene glycol (MIRALAX / GLYCOLAX) packet Take 17 g by mouth daily.      Marland Kitchen. rOPINIRole (REQUIP) 1 MG tablet Take 1 tablet (1 mg total) by mouth at bedtime.  30 tablet  6  . warfarin (COUMADIN) 5 MG tablet Take 1 tablet by mouth daily or as directed  90 tablet  1  . amiodarone (PACERONE) 200 MG tablet Take 1 tablet (200 mg total) by mouth daily.  30 tablet  6  . [DISCONTINUED] polysaccharide iron (NIFEREX) 150 MG CAPS capsule Take 1 capsule (150 mg total) by mouth daily. For one month then stop.  30  each  0   No current facility-administered medications for this visit.    Past Medical History  Diagnosis Date  . Shortness of breath with exertion  . Chronic kidney disease     not on dialysis yet  . Coronary artery disease 05/14/2010    stress test - no scintigraphic evidence of inducible myocardial ischemia,; normal study  . Hypertension   . Arthritis   . Blood dyscrasia     one time had low platlet count  . Dysrhythmia     atrial fib  . SSS (sick sinus syndrome) 08/27/2011    echo EF >55%, severe LAE, moderate RAE, tissue AVR w/ gradients 35 and 17mmHg  . Pericardial effusion 03/12/2011    echo EF 55-60%  . Claudication 02/19/2012    LE doppler no evidence of arterial insufficiency, lower extremities demonstrate normal values  w/ no evidence of insufficiency  . Lupus nephritis   . Pacemaker 09/19/2004    implanted  . Restless leg syndrome   . Cellulitis   . Anemia   . Benign prostatic hypertrophy   . Constipation   . Shoulder joint pain     Past Surgical History  Procedure Laterality Date  . Back surgery    . Appendectomy    . Hernia repair    . Pacemaker insertion  2006    Medtronic EnRhythm  . Av fistula placement  02/24/2011  . Aortic valve replacement  02/24/2011    pericardial tissue valve [facility] Magna Ease  . Coronary artery bypass graft  02/24/2011    LIMA to LAD, SVG to distal RCA  . Cardiac catheterization  02/13/2011    mod/severe aortic valve stenosis w peak to peak gradient 25-32 mmHg,  70% stenosis LAD, 30-40%proximal followed by 90% sstenosis in RCA prior to anterior RV margin branch  . Lumbar laminectomy/decompression microdiscectomy Right 04/16/2012    Procedure: DECOMPRESSIVE L4 - L5/ MICRODISCECTOMY ON THE RIGHT 1 LEVEL;  Surgeon: Jacki Conesonald A Gioffre, MD;  Location: [facility];  Service: Orthopedics;  Laterality: Right;    Family History  Problem Relation Age of Onset  . Hypertension Mother   . Kidney disease Brother     History   Social History  .  Marital Status: Married    Spouse Name: [name]    Number of Children: 1  . Years of Education: 6   Occupational History  . Not on file.   Social History Main Topics  . Smoking status: Former Smoker    Types: Cigarettes    Quit date: 02/11/1957  . Smokeless tobacco: Never Used  . Alcohol Use: No  . Drug Use: No  . Sexual Activity: Not on file   Other Topics Concern  . Not on file   Social History Narrative   Patient is married ([name]).  Patient has one child.   Patient does not drink caffeine.   Patient is right-handed.   Patient is retired.   Patient has a 6th grade education.          Review of systems: The patient specifically denies any chest pain at rest or with exertion, dyspnea at rest or with exertion, orthopnea, paroxysmal nocturnal dyspnea, syncope, palpitations, focal neurological deficits, intermittent claudication, lower extremity edema, unexplained weight gain, cough, hemoptysis or wheezing.  The patient also denies abdominal pain, nausea, vomiting, dysphagia, diarrhea, constipation, polyuria, polydipsia, dysuria, hematuria, frequency, urgency, abnormal bleeding or bruising, fever, chills, unexpected weight changes, mood swings, change in skin or hair texture, change in voice quality, auditory or visual problems, allergic reactions or rashes, new musculoskeletal complaints other than usual "aches and pains".    PHYSICAL EXAM BP 118/60  Pulse 80  Resp 16  Ht 5' 7.5" (1.715 m)  Wt 77.066 kg (169 lb 14.4 oz)  BMI 26.20 kg/m2 General: Alert, oriented x3, no distress  Head: no evidence of trauma, PERRL, EOMI, no exophtalmos or lid lag, no myxedema, no xanthelasma; normal ears, nose and oropharynx  Neck: normal jugular venous pulsations and no hepatojugular reflux; brisk carotid pulses without delay and faint bilateral carotid bruits  Chest: clear to auscultation, no signs of consolidation by percussion or palpation, normal fremitus, symmetrical and full respiratory  excursions; healed sternotomy, healthy right subclavian pacemaker site  Cardiovascular: normal position and quality of the apical impulse, regular rhythm, normal first and paradoxically split second heart sounds, no rubs or gallops, early peaking grade 2/6 systolic ejection murmur, no diastolic murmur  Abdomen: no tenderness or distention, no masses by palpation, no abnormal pulsatility or arterial bruits, normal bowel sounds, no hepatosplenomegaly  Extremities: Large tortuous left forearm AV fistula with excellent thrill and bruit  no clubbing, cyanosis or edema; 2+ radial, ulnar and brachial pulses bilaterally; 2+ right femoral, posterior tibial and dorsalis pedis pulses; 2+ left femoral, posterior tibial and dorsalis pedis pulses; no subclavian or femoral bruits  Neurological: grossly nonfocal except mild resting tremor   EKG: AV sequential paced   Lipid Panel     Component Value Date/Time   CHOL 190 07/08/2012 0955   TRIG 164* 07/08/2012 0955   HDL 28* 07/08/2012 0955   CHOLHDL 6.8 07/08/2012 0955   VLDL 33 07/08/2012 0955   LDLCALC 129* 07/08/2012 0955    BMET    Component Value Date/Time   NA 143 07/08/2012 0955   K 4.1 07/08/2012 0955   CL 105 07/08/2012 0955   CO2 25 07/08/2012 0955   GLUCOSE 89 07/08/2012 0955   BUN 49* 07/08/2012 0955   CREATININE 3.26* 07/08/2012 0955   CREATININE 2.67* 04/17/2012 0527   CALCIUM 9.5 07/08/2012 0955   GFRNONAA 21* 04/17/2012 0527   GFRAA 24* 04/17/2012 0527     ASSESSMENT AND PLAN  Mr. Karnes has a normally functioning permanent pacemaker that is very close to elective replacement indicator. It is anticipated that he will require pacemaker change out for the summer. On the other hand this particular moderate pacemaker is known to be rather unpredictable as far as battery voltage decay.  Since there has been a remarkable increase in the burden of atrial fibrillation I have recommended that he resume amiodarone.  He does not have any symptoms to  suggest aortic stenosis or coronary insufficiency.  I am still not sure why he is not taking a statin. I have been unable to retrieve his old charts. I  can tell that he was on simvastatin in 2006 and I am not sure when this was discontinued. There is no mention of statin intolerance in his chart. He had bypass surgery. The most recent lipid profile in 2014 shows an LDL cholesterol level of 129. Dr. Kandis CockingWeintraub's notes state that he had a LDL cholesterol of 91 in 2012 seems that he was not receiving a statin at that time. I think just repeat a lipid profile prior to his next appointment and discuss whether or not he should be back on statin therapy.  Orders Placed This Encounter  Procedures  . EKG 12-Lead   Meds ordered this encounter  Medications  . amiodarone (PACERONE) 200 MG tablet    Sig: Take 1 tablet (200 mg total) by mouth daily.    Dispense:  30 tablet    Refill:  6    Wade Asebedo  Thurmon FairMihai Sharlie Shreffler, MD, [facility] [phone] office [phone] pager

## 2013-04-21 ENCOUNTER — Telehealth: Payer: Self-pay | Admitting: *Deleted

## 2013-04-21 DIAGNOSIS — Z79899 Other long term (current) drug therapy: Secondary | ICD-10-CM

## 2013-04-21 DIAGNOSIS — E782 Mixed hyperlipidemia: Secondary | ICD-10-CM

## 2013-04-21 LAB — MDC_IDC_ENUM_SESS_TYPE_INCLINIC
Brady Statistic AP VP Percent: 87.29 %
Brady Statistic AP VS Percent: 0.09 %
Brady Statistic AS VP Percent: 12.59 %
Brady Statistic AS VS Percent: 0.02 %
Lead Channel Impedance Value: 324 Ohm
Lead Channel Pacing Threshold Amplitude: 1 V
Lead Channel Sensing Intrinsic Amplitude: 0.396 mV
Lead Channel Sensing Intrinsic Amplitude: 6.7712
Lead Channel Setting Pacing Amplitude: 2.5 V
Lead Channel Setting Pacing Amplitude: 3 V
Lead Channel Setting Pacing Pulse Width: 0.9 ms
Lead Channel Setting Sensing Sensitivity: 1.2 mV
MDC IDC MSMT BATTERY VOLTAGE: 2.81 V
MDC IDC MSMT LEADCHNL RV IMPEDANCE VALUE: 344 Ohm
MDC IDC MSMT LEADCHNL RV PACING THRESHOLD PULSEWIDTH: 0.9 ms
MDC IDC SESS DTM: 20150330145700
MDC IDC SET ZONE DETECTION INTERVAL: 400 ms
MDC IDC STAT BRADY RA PERCENT PACED: 87.38 %
MDC IDC STAT BRADY RV PERCENT PACED: 99.88 %
Zone Setting Detection Interval: 350 ms

## 2013-04-21 NOTE — Telephone Encounter (Signed)
Order from Dr. Salena Saner have a Lipid Profile and CMET done before next visit.  Order mailed to patient along with a note instructing to have this lab work done FASTING.

## 2013-04-21 NOTE — Telephone Encounter (Signed)
Per Dr. Salena Saner. Have patient come in tomorrow and see Nada BoozerLaura Ingold, NP and have pacemaker checked.  [facility]

## 2013-04-21 NOTE — Telephone Encounter (Signed)
Can he come in to see PA tomorrow?

## 2013-04-21 NOTE — Telephone Encounter (Signed)
Returned call and informed pt per instructions by MD/PA.  Pt verbalized understanding and agreed w/ plan.   Appt scheduled for tomorrow at 11:40am w/ Nada BoozerLaura Ingold, NP

## 2013-04-21 NOTE — Telephone Encounter (Signed)
Returned call and pt verified x 2.  Pt stated he was seen Monday for pacemaker check.  Stated he hasn't felt good since he was seen.  Pt c/o feeling short-winded and pain in the back of his neck if he goes walking.  Stated he sometimes feels short-winded when he is sitting.  Stated his BP is fine and his pulse has been around 65.  Stated his pulse is supposed to be set around 70.  Pt informed amiodarone may be the cause of lower pulse.  Pt stated BP was 115/55 at last check.  Pt also c/o intermittent chest tightness w/ SOB and it can occur if he is sitting still or moving.  Denied swelling, but stated he has leg swelling at night that goes away by morning.  Denied CP/pressure or SOB now.  Pt informed Dr. Salena Saner will be notified for further instructions and RN will call him back.  Pt verbalized understanding and agreed w/ plan.  Message forwarded to Dr. Royann Shiversroitoru.

## 2013-04-21 NOTE — Telephone Encounter (Signed)
Pt's wife called and she stated that he is not feeling well since he got his pacemaker checked and would like a call back.  MC

## 2013-04-22 ENCOUNTER — Ambulatory Visit (INDEPENDENT_AMBULATORY_CARE_PROVIDER_SITE_OTHER): Payer: Medicare Other | Admitting: Cardiology

## 2013-04-22 VITALS — BP 120/60 | HR 65 | Ht 67.0 in | Wt 170.0 lb

## 2013-04-22 DIAGNOSIS — I4891 Unspecified atrial fibrillation: Secondary | ICD-10-CM

## 2013-04-22 DIAGNOSIS — I480 Paroxysmal atrial fibrillation: Secondary | ICD-10-CM

## 2013-04-22 DIAGNOSIS — R1013 Epigastric pain: Secondary | ICD-10-CM

## 2013-04-22 DIAGNOSIS — Z950 Presence of cardiac pacemaker: Secondary | ICD-10-CM

## 2013-04-22 DIAGNOSIS — R0989 Other specified symptoms and signs involving the circulatory and respiratory systems: Secondary | ICD-10-CM

## 2013-04-22 DIAGNOSIS — R0609 Other forms of dyspnea: Secondary | ICD-10-CM

## 2013-04-22 DIAGNOSIS — R0600 Dyspnea, unspecified: Secondary | ICD-10-CM

## 2013-04-22 DIAGNOSIS — Z951 Presence of aortocoronary bypass graft: Secondary | ICD-10-CM

## 2013-04-22 NOTE — Patient Instructions (Signed)
Your pacemaker needs to be changed to a new generator, we will do this in 2 weeks with Dr. Royann Shiversroitoru  Your pacemaker was adjusted today, you should feel better, but if the chest discomfort continues let us know Monday and I will order a stress test.  Not the kind your walk with.  If the chest pain or shortness of breath becomes severe go to the hospital.

## 2013-04-22 NOTE — Progress Notes (Signed)
04/25/2013   PCP: Delorse Lek, MD   Chief Complaint  Patient presents with  . Follow-up    feeling weak,sob    Primary Cardiologist: Dr. Royann Shivers  HPI: 80 years old and underwent aortic valve replacement with a biological prosthesis (23 mm) and coronary artery bypass surgery in 2013. He has recurrent paroxysmal atrial tachycardia and atrial fibrillation for which he takes warfarin anticoagulation. A pacemaker was implanted in 2006 (Medtronic enRhythm) for sinus node dysfunction and is still functioning normally although it was approaching ERI on last check in March.  He is not truly pacemaker dependent but the underlying rhythm is severe sinus bradycardia at about 30 beats per minute.   He has severe chronic kidney disease and is approaching end-stage renal disease. He has a working AV fistula but has not required hemodialysis. His kidney failure is suspected to be secondary to lupus nephritis but lupus has been in remission for several years. He has severe lumbar spine disease and has previously undergone surgery on couple of occasions. He has also had to stop his warfarin intermittently for spinal injections.   Interrogation of his pacemaker 04/18/13 showed that there was a marked increase in frequency and duration of episodes of atrial fibrillation starting in late January. The current atrial fibrillation burden is 13.5% up from essentially 0. Episodes of atrial fibrillation of up to 3 hours in duration have been recorded as recently as the day before. This correlates well with the expected gradual resolution of amiodarone effect. Despite this there has been no reduction in the frequency of ventricular pacing: it remains 100%.   Pt presents today for complaints of dyspnea, along with tightness in his chest/epigastric area.  No nausea, no diaphoresis.  Initially occurred when walking to the mailbox.  Now occurs at different occs.  No awareness of Heart beat.  Some mild  swelling during the day of his lower ext, but resolves during the night.  None currently.    Pacer interrogated and found to be ERI. He is also in atrial fib.  His pacer was readjusted.  Discussed with Dr. Royann Shivers.         Allergies  Allergen Reactions  . Statins     myalgias  . Aspirin Hives    Patient states that he can take the enteric coated but not the uncoated  . Feraheme [Ferumoxytol] Rash and Other (See Comments)    Abdominal pain  . Penicillins Hives    Current Outpatient Prescriptions  Medication Sig Dispense Refill  . amiodarone (PACERONE) 200 MG tablet Take 1 tablet (200 mg total) by mouth daily.  30 tablet  6  . amLODipine (NORVASC) 10 MG tablet Take 10 mg by mouth daily.      Marland Kitchen aspirin EC 81 MG tablet Take 81 mg by mouth daily.      . calcitRIOL (ROCALTROL) 0.25 MCG capsule Take 0.25 mcg by mouth daily before breakfast.       . Cyanocobalamin (VITAMIN B 12 PO) Take 1 tablet by mouth daily.      . ferrous sulfate 325 (65 FE) MG tablet Take 325 mg by mouth daily with breakfast.      . finasteride (PROSCAR) 5 MG tablet Take 5 mg by mouth daily.      . fish oil-omega-3 fatty acids 1000 MG capsule Take 2 g by mouth daily.      . furosemide (LASIX) 40 MG tablet Take 120 mg by mouth 2 (two)  times daily. Takes 3 tablets      . metoprolol (LOPRESSOR) 50 MG tablet Take 25 mg by mouth daily before breakfast.       . Multiple Vitamins-Minerals (OCUVITE PRESERVISION PO) Take 1 tablet by mouth daily.      . polyethylene glycol (MIRALAX / GLYCOLAX) packet Take 17 g by mouth daily.      Marland Kitchen. rOPINIRole (REQUIP) 1 MG tablet Take 1 tablet (1 mg total) by mouth at bedtime.  30 tablet  6  . warfarin (COUMADIN) 5 MG tablet Take 1 tablet by mouth daily or as directed  90 tablet  1  . [DISCONTINUED] polysaccharide iron (NIFEREX) 150 MG CAPS capsule Take 1 capsule (150 mg total) by mouth daily. For one month then stop.  30 each  0   No current facility-administered medications for this  visit.    Past Medical History  Diagnosis Date  . Shortness of breath with exertion  . Chronic kidney disease     not on dialysis yet  . Coronary artery disease 05/14/2010    stress test - no scintigraphic evidence of inducible myocardial ischemia,; normal study  . Hypertension   . Arthritis   . Blood dyscrasia     one time had low platlet count  . Dysrhythmia     atrial fib  . SSS (sick sinus syndrome) 08/27/2011    echo EF >55%, severe LAE, moderate RAE, tissue AVR w/ gradients 35 and 17mmHg  . Pericardial effusion 03/12/2011    echo EF 55-60%  . Claudication 02/19/2012    LE doppler no evidence of arterial insufficiency, lower extremities demonstrate normal values  w/ no evidence of insufficiency  . Lupus nephritis   . Pacemaker 09/19/2004    implanted  . Restless leg syndrome   . Cellulitis   . Anemia   . Benign prostatic hypertrophy   . Constipation   . Shoulder joint pain     Past Surgical History  Procedure Laterality Date  . Back surgery    . Appendectomy    . Hernia repair    . Pacemaker insertion  2006    Medtronic EnRhythm  . Av fistula placement  02/24/2011  . Aortic valve replacement  02/24/2011    pericardial tissue valve [facility] Magna Ease  . Coronary artery bypass graft  02/24/2011    LIMA to LAD, SVG to distal RCA  . Cardiac catheterization  02/13/2011    mod/severe aortic valve stenosis w peak to peak gradient 25-32 mmHg,  70% stenosis LAD, 30-40%proximal followed by 90% sstenosis in RCA prior to anterior RV margin branch  . Lumbar laminectomy/decompression microdiscectomy Right 04/16/2012    Procedure: DECOMPRESSIVE L4 - L5/ MICRODISCECTOMY ON THE RIGHT 1 LEVEL;  Surgeon: Jacki Conesonald A Gioffre, MD;  Location: [facility];  Service: Orthopedics;  Laterality: Right;    NWG:NFAOZHY:QMROS:General:no colds or fevers, no weight changes Skin:no rashes or ulcers HEENT:no blurred vision, no congestion, no adenopathy, no thyromegaly  CV:see HPI PUL:see HPI GI:no diarrhea constipation or  melena, no indigestion GU:no hematuria, no dysuria MS:no joint pain, no claudication- currently  Neuro:no syncope, no lightheadedness Endo:no diabetes, no thyroid disease  PHYSICAL EXAM BP 120/60  Pulse 65  Ht 5\' 7"  (1.702 m)  Wt 170 lb (77.111 kg)  BMI 26.62 kg/m2 General:Pleasant affect, NAD Skin:Warm and dry, brisk capillary refill HEENT:normocephalic, sclera clear, mucus membranes moist,  Neck:supple, no JVD, faint carotid  Bruits, no adenopathy, no thyromegaly  Heart: RRR with  II/VI systolic murmur, no gallup,  rub or click Lungs:clear without rales, rhonchi, or wheezes JXB:JYNW, non tender, + BS, do not palpate liver spleen or masses Ext:tr lower ext edema, 2+ pedal pulses, 2+ radial pulses, Lt arm AV shunt with + thrill and bruit Neuro:alert and oriented, MAE, follows commands, + facial symmetry  EKG: VVI pacing no acute changes  ASSESSMENT AND PLAN Dyspnea, began 04/19/13 Began on 04/19/13, became SOB, today we find his pacemaker ERI, pacer settings were adjusted back .  Pt also in atrial fib, though he has been in a fib without these symptoms.  His symptoms coincided with his ERI, we adjusted pacemaker, he will call us next week to let us know how he is.  If no better may need stress test with his symptoms with epigastric and chest discomfort.      Epigastric discomfort/chest discomfort Also began 04/19/13, see previous note  PAF (paroxysmal atrial fibrillation), on Amiodarone prior to admission Currently in atrial fib.  Rate controlled, he is anticoagulated.,  While this may cause his symptoms we will check to see if pacer adjustments improve symptoms.  S/P CABG x 2: (LIMA-LAD  SVG - RCA) Epigastric chest pain, will see how he is next week.    Pacemaker, MDT MDT implanted 08/06, ERI, pacer changed back to correct settings 04/22/13 If symptoms have not improved by next week, we will do stress test.  If neg will have EP do gen change.  I discussed with Dr. Royann Shivers -he is not  available for 4 weeks due to scheduling issues.  Though if improvement of pacer settings relieves symptoms pt will be stable to wait 4-5 weeks.  Will discuss with Pt next week.

## 2013-04-25 ENCOUNTER — Encounter: Payer: Self-pay | Admitting: Cardiology

## 2013-04-25 DIAGNOSIS — R1013 Epigastric pain: Secondary | ICD-10-CM | POA: Insufficient documentation

## 2013-04-25 DIAGNOSIS — R0600 Dyspnea, unspecified: Secondary | ICD-10-CM | POA: Insufficient documentation

## 2013-04-25 NOTE — Assessment & Plan Note (Signed)
Epigastric chest pain, will see how he is next week.

## 2013-04-25 NOTE — Assessment & Plan Note (Signed)
Began on 04/19/13, became SOB, today we find his pacemaker ERI, pacer settings were adjusted back .  Pt also in atrial fib, though he has been in a fib without these symptoms.  His symptoms coincided with his ERI, we adjusted pacemaker, he will call us next week to let us know how he is.  If no better may need stress test with his symptoms with epigastric and chest discomfort.

## 2013-04-25 NOTE — Assessment & Plan Note (Signed)
Also began 04/19/13, see previous note

## 2013-04-25 NOTE — Assessment & Plan Note (Signed)
Currently in atrial fib.  Rate controlled, he is anticoagulated.,  While this may cause his symptoms we will check to see if pacer adjustments improve symptoms.

## 2013-04-25 NOTE — Assessment & Plan Note (Signed)
If symptoms have not improved by next week, we will do stress test.  If neg will have EP do gen change.  I discussed with Dr. Royann Shiversroitoru -he is not available for 4 weeks due to scheduling issues.  Though if improvement of pacer settings relieves symptoms pt will be stable to wait 4-5 weeks.  Will discuss with Pt next week.

## 2013-04-26 ENCOUNTER — Telehealth: Payer: Self-pay | Admitting: Cardiology

## 2013-04-26 NOTE — Telephone Encounter (Signed)
I talked to pts wife. He feels much better.  I will have him follow up with Dr. Royann Shiversroitoru and /or schedule pacer change in 4-5 weeks.

## 2013-04-27 ENCOUNTER — Encounter: Payer: Self-pay | Admitting: Cardiology

## 2013-05-02 ENCOUNTER — Ambulatory Visit (INDEPENDENT_AMBULATORY_CARE_PROVIDER_SITE_OTHER): Payer: Medicare Other | Admitting: Pharmacist Clinician (PhC)/ Clinical Pharmacy Specialist

## 2013-05-02 VITALS — BP 122/64 | HR 96

## 2013-05-02 DIAGNOSIS — I4891 Unspecified atrial fibrillation: Secondary | ICD-10-CM

## 2013-05-02 DIAGNOSIS — I480 Paroxysmal atrial fibrillation: Secondary | ICD-10-CM

## 2013-05-02 DIAGNOSIS — Z7901 Long term (current) use of anticoagulants: Secondary | ICD-10-CM

## 2013-05-02 LAB — POCT INR: INR: 1.7

## 2013-05-08 ENCOUNTER — Observation Stay (HOSPITAL_COMMUNITY): Payer: Medicare Other

## 2013-05-08 ENCOUNTER — Emergency Department (HOSPITAL_COMMUNITY): Payer: Medicare Other

## 2013-05-08 ENCOUNTER — Inpatient Hospital Stay (HOSPITAL_COMMUNITY)
Admission: EM | Admit: 2013-05-08 | Discharge: 2013-05-10 | DRG: 982 | Disposition: A | Discharge status: Home / Self Care | Payer: Medicare Other | Attending: Internal Medicine | Admitting: Internal Medicine

## 2013-05-08 ENCOUNTER — Encounter (HOSPITAL_COMMUNITY): Payer: Self-pay | Admitting: Emergency Medicine

## 2013-05-08 DIAGNOSIS — S8011XA Contusion of right lower leg, initial encounter: Secondary | ICD-10-CM

## 2013-05-08 DIAGNOSIS — R0600 Dyspnea, unspecified: Secondary | ICD-10-CM

## 2013-05-08 DIAGNOSIS — R600 Localized edema: Secondary | ICD-10-CM

## 2013-05-08 DIAGNOSIS — R1013 Epigastric pain: Secondary | ICD-10-CM

## 2013-05-08 DIAGNOSIS — Z888 Allergy status to other drugs, medicaments and biological substances status: Secondary | ICD-10-CM

## 2013-05-08 DIAGNOSIS — Z9089 Acquired absence of other organs: Secondary | ICD-10-CM

## 2013-05-08 DIAGNOSIS — Z7982 Long term (current) use of aspirin: Secondary | ICD-10-CM

## 2013-05-08 DIAGNOSIS — M7981 Nontraumatic hematoma of soft tissue: Principal | ICD-10-CM | POA: Diagnosis present

## 2013-05-08 DIAGNOSIS — N186 End stage renal disease: Secondary | ICD-10-CM | POA: Diagnosis present

## 2013-05-08 DIAGNOSIS — Z886 Allergy status to analgesic agent status: Secondary | ICD-10-CM

## 2013-05-08 DIAGNOSIS — N189 Chronic kidney disease, unspecified: Secondary | ICD-10-CM

## 2013-05-08 DIAGNOSIS — D6832 Hemorrhagic disorder due to extrinsic circulating anticoagulants: Secondary | ICD-10-CM | POA: Diagnosis present

## 2013-05-08 DIAGNOSIS — D649 Anemia, unspecified: Secondary | ICD-10-CM

## 2013-05-08 DIAGNOSIS — Z720 Tobacco use: Secondary | ICD-10-CM

## 2013-05-08 DIAGNOSIS — Z87891 Personal history of nicotine dependence: Secondary | ICD-10-CM

## 2013-05-08 DIAGNOSIS — Z7901 Long term (current) use of anticoagulants: Secondary | ICD-10-CM

## 2013-05-08 DIAGNOSIS — R001 Bradycardia, unspecified: Secondary | ICD-10-CM

## 2013-05-08 DIAGNOSIS — G2581 Restless legs syndrome: Secondary | ICD-10-CM

## 2013-05-08 DIAGNOSIS — Z8249 Family history of ischemic heart disease and other diseases of the circulatory system: Secondary | ICD-10-CM

## 2013-05-08 DIAGNOSIS — D62 Acute posthemorrhagic anemia: Secondary | ICD-10-CM | POA: Diagnosis present

## 2013-05-08 DIAGNOSIS — I4821 Permanent atrial fibrillation: Secondary | ICD-10-CM | POA: Diagnosis present

## 2013-05-08 DIAGNOSIS — M48062 Spinal stenosis, lumbar region with neurogenic claudication: Secondary | ICD-10-CM

## 2013-05-08 DIAGNOSIS — M7989 Other specified soft tissue disorders: Secondary | ICD-10-CM

## 2013-05-08 DIAGNOSIS — I480 Paroxysmal atrial fibrillation: Secondary | ICD-10-CM

## 2013-05-08 DIAGNOSIS — N400 Enlarged prostate without lower urinary tract symptoms: Secondary | ICD-10-CM | POA: Diagnosis present

## 2013-05-08 DIAGNOSIS — D689 Coagulation defect, unspecified: Secondary | ICD-10-CM

## 2013-05-08 DIAGNOSIS — I4891 Unspecified atrial fibrillation: Secondary | ICD-10-CM

## 2013-05-08 DIAGNOSIS — Z951 Presence of aortocoronary bypass graft: Secondary | ICD-10-CM

## 2013-05-08 DIAGNOSIS — Z950 Presence of cardiac pacemaker: Secondary | ICD-10-CM | POA: Diagnosis present

## 2013-05-08 DIAGNOSIS — J9690 Respiratory failure, unspecified, unspecified whether with hypoxia or hypercapnia: Secondary | ICD-10-CM

## 2013-05-08 DIAGNOSIS — G609 Hereditary and idiopathic neuropathy, unspecified: Secondary | ICD-10-CM

## 2013-05-08 DIAGNOSIS — Z952 Presence of prosthetic heart valve: Secondary | ICD-10-CM

## 2013-05-08 DIAGNOSIS — I2510 Atherosclerotic heart disease of native coronary artery without angina pectoris: Secondary | ICD-10-CM | POA: Diagnosis present

## 2013-05-08 DIAGNOSIS — T45515A Adverse effect of anticoagulants, initial encounter: Secondary | ICD-10-CM

## 2013-05-08 DIAGNOSIS — Z79899 Other long term (current) drug therapy: Secondary | ICD-10-CM

## 2013-05-08 DIAGNOSIS — S8010XA Contusion of unspecified lower leg, initial encounter: Secondary | ICD-10-CM | POA: Diagnosis present

## 2013-05-08 DIAGNOSIS — I495 Sick sinus syndrome: Secondary | ICD-10-CM

## 2013-05-08 DIAGNOSIS — I129 Hypertensive chronic kidney disease with stage 1 through stage 4 chronic kidney disease, or unspecified chronic kidney disease: Secondary | ICD-10-CM | POA: Diagnosis present

## 2013-05-08 DIAGNOSIS — Z841 Family history of disorders of kidney and ureter: Secondary | ICD-10-CM

## 2013-05-08 DIAGNOSIS — M329 Systemic lupus erythematosus, unspecified: Secondary | ICD-10-CM | POA: Diagnosis present

## 2013-05-08 DIAGNOSIS — Z880 Allergy status to penicillin: Secondary | ICD-10-CM

## 2013-05-08 DIAGNOSIS — Z45010 Encounter for checking and testing of cardiac pacemaker pulse generator [battery]: Secondary | ICD-10-CM

## 2013-05-08 DIAGNOSIS — N184 Chronic kidney disease, stage 4 (severe): Secondary | ICD-10-CM | POA: Diagnosis present

## 2013-05-08 LAB — CBC WITH DIFFERENTIAL/PLATELET
BASOS ABS: 0 10*3/uL (ref 0.0–0.1)
Basophils Relative: 0 % (ref 0–1)
EOS PCT: 2 % (ref 0–5)
Eosinophils Absolute: 0.1 10*3/uL (ref 0.0–0.7)
HCT: 24 % — ABNORMAL LOW (ref 39.0–52.0)
Hemoglobin: 7.7 g/dL — ABNORMAL LOW (ref 13.0–17.0)
LYMPHS PCT: 12 % (ref 12–46)
Lymphs Abs: 0.7 10*3/uL (ref 0.7–4.0)
MCH: 28.5 pg (ref 26.0–34.0)
MCHC: 32.1 g/dL (ref 30.0–36.0)
MCV: 88.9 fL (ref 78.0–100.0)
MONO ABS: 0.7 10*3/uL (ref 0.1–1.0)
Monocytes Relative: 11 % (ref 3–12)
Neutro Abs: 4.5 10*3/uL (ref 1.7–7.7)
Neutrophils Relative %: 75 % (ref 43–77)
Platelets: 110 10*3/uL — ABNORMAL LOW (ref 150–400)
RBC: 2.7 MIL/uL — ABNORMAL LOW (ref 4.22–5.81)
RDW: 16.9 % — AB (ref 11.5–15.5)
WBC: 6 10*3/uL (ref 4.0–10.5)

## 2013-05-08 LAB — CBC
HEMATOCRIT: 23.8 % — AB (ref 39.0–52.0)
Hemoglobin: 7.6 g/dL — ABNORMAL LOW (ref 13.0–17.0)
MCH: 28.4 pg (ref 26.0–34.0)
MCHC: 31.9 g/dL (ref 30.0–36.0)
MCV: 88.8 fL (ref 78.0–100.0)
Platelets: 122 10*3/uL — ABNORMAL LOW (ref 150–400)
RBC: 2.68 MIL/uL — ABNORMAL LOW (ref 4.22–5.81)
RDW: 16.9 % — ABNORMAL HIGH (ref 11.5–15.5)
WBC: 6.2 10*3/uL (ref 4.0–10.5)

## 2013-05-08 LAB — I-STAT CHEM 8, ED
BUN: 63 mg/dL — ABNORMAL HIGH (ref 6–23)
CREATININE: 3.4 mg/dL — AB (ref 0.50–1.35)
Calcium, Ion: 1.22 mmol/L (ref 1.13–1.30)
Chloride: 106 mEq/L (ref 96–112)
Glucose, Bld: 105 mg/dL — ABNORMAL HIGH (ref 70–99)
HCT: 22 % — ABNORMAL LOW (ref 39.0–52.0)
Hemoglobin: 7.5 g/dL — ABNORMAL LOW (ref 13.0–17.0)
POTASSIUM: 4.2 meq/L (ref 3.7–5.3)
Sodium: 141 mEq/L (ref 137–147)
TCO2: 26 mmol/L (ref 0–100)

## 2013-05-08 LAB — TYPE AND SCREEN
ABO/RH(D): A POS
ANTIBODY SCREEN: NEGATIVE

## 2013-05-08 LAB — PROTIME-INR
INR: 3.19 — ABNORMAL HIGH (ref 0.00–1.49)
Prothrombin Time: 31.5 seconds — ABNORMAL HIGH (ref 11.6–15.2)

## 2013-05-08 MED ORDER — ASPIRIN EC 81 MG PO TBEC
81.0000 mg | DELAYED_RELEASE_TABLET | Freq: Every day | ORAL | Status: DC
  Administered 2013-05-09 – 2013-05-10 (×2): 81 mg via ORAL
  Filled 2013-05-08 (×2): qty 1

## 2013-05-08 MED ORDER — OMEGA-3 FATTY ACIDS 1000 MG PO CAPS: 2.0000 g | ORAL_CAPSULE | Freq: Every day | ORAL | Status: DC

## 2013-05-08 MED ORDER — ACETAMINOPHEN 650 MG RE SUPP: 650.0000 mg | Freq: Four times a day (QID) | RECTAL | Status: DC | PRN

## 2013-05-08 MED ORDER — PREGABALIN 25 MG PO CAPS
25.0000 mg | ORAL_CAPSULE | Freq: Every day | ORAL | Status: DC
  Administered 2013-05-08 – 2013-05-09 (×2): 25 mg via ORAL
  Filled 2013-05-08 (×2): qty 1

## 2013-05-08 MED ORDER — ALBUTEROL SULFATE (2.5 MG/3ML) 0.083% IN NEBU: 2.5000 mg | INHALATION_SOLUTION | RESPIRATORY_TRACT | Status: DC | PRN

## 2013-05-08 MED ORDER — FERROUS SULFATE 325 (65 FE) MG PO TABS
325.0000 mg | ORAL_TABLET | Freq: Every day | ORAL | Status: DC
  Administered 2013-05-09 – 2013-05-10 (×2): 325 mg via ORAL
  Filled 2013-05-08 (×3): qty 1

## 2013-05-08 MED ORDER — ONDANSETRON HCL 4 MG/2ML IJ SOLN: 4.0000 mg | Freq: Four times a day (QID) | INTRAMUSCULAR | Status: DC | PRN

## 2013-05-08 MED ORDER — AMLODIPINE BESYLATE 10 MG PO TABS
10.0000 mg | ORAL_TABLET | Freq: Every day | ORAL | Status: DC
  Administered 2013-05-09 – 2013-05-10 (×2): 10 mg via ORAL
  Filled 2013-05-08 (×2): qty 1

## 2013-05-08 MED ORDER — ROPINIROLE HCL 1 MG PO TABS
1.0000 mg | ORAL_TABLET | Freq: Every day | ORAL | Status: DC
  Administered 2013-05-08 – 2013-05-09 (×2): 1 mg via ORAL
  Filled 2013-05-08 (×3): qty 1

## 2013-05-08 MED ORDER — ACETAMINOPHEN 325 MG PO TABS
650.0000 mg | ORAL_TABLET | Freq: Four times a day (QID) | ORAL | Status: DC | PRN
  Administered 2013-05-08: 650 mg via ORAL
  Filled 2013-05-08: qty 2

## 2013-05-08 MED ORDER — SODIUM CHLORIDE 0.9 % IJ SOLN
3.0000 mL | Freq: Two times a day (BID) | INTRAMUSCULAR | Status: DC
  Administered 2013-05-08 – 2013-05-09 (×3): 3 mL via INTRAVENOUS

## 2013-05-08 MED ORDER — METOPROLOL TARTRATE 25 MG PO TABS
25.0000 mg | ORAL_TABLET | Freq: Every day | ORAL | Status: DC
  Administered 2013-05-09 – 2013-05-10 (×2): 25 mg via ORAL
  Filled 2013-05-08 (×3): qty 1

## 2013-05-08 MED ORDER — ONDANSETRON HCL 4 MG PO TABS: 4.0000 mg | ORAL_TABLET | Freq: Four times a day (QID) | ORAL | Status: DC | PRN

## 2013-05-08 MED ORDER — OMEGA-3-ACID ETHYL ESTERS 1 G PO CAPS
2.0000 g | ORAL_CAPSULE | Freq: Every day | ORAL | Status: DC
  Administered 2013-05-09 – 2013-05-10 (×2): 2 g via ORAL
  Filled 2013-05-08 (×2): qty 2

## 2013-05-08 MED ORDER — OXYCODONE HCL 5 MG PO TABS
5.0000 mg | ORAL_TABLET | ORAL | Status: DC | PRN
  Administered 2013-05-08 – 2013-05-09 (×3): 5 mg via ORAL
  Filled 2013-05-08 (×3): qty 1

## 2013-05-08 MED ORDER — POLYETHYLENE GLYCOL 3350 17 G PO PACK
17.0000 g | PACK | Freq: Every day | ORAL | Status: DC
  Administered 2013-05-09: 17 g via ORAL
  Filled 2013-05-08 (×2): qty 1

## 2013-05-08 MED ORDER — CALCITRIOL 0.25 MCG PO CAPS
0.2500 ug | ORAL_CAPSULE | Freq: Every day | ORAL | Status: DC
  Administered 2013-05-09 – 2013-05-10 (×2): 0.25 ug via ORAL
  Filled 2013-05-08 (×3): qty 1

## 2013-05-08 MED ORDER — AMIODARONE HCL 200 MG PO TABS
200.0000 mg | ORAL_TABLET | Freq: Every day | ORAL | Status: DC
  Administered 2013-05-09 – 2013-05-10 (×2): 200 mg via ORAL
  Filled 2013-05-08 (×2): qty 1

## 2013-05-08 MED ORDER — CYCLOSPORINE 0.05 % OP EMUL
1.0000 [drp] | Freq: Two times a day (BID) | OPHTHALMIC | Status: DC
  Administered 2013-05-08 – 2013-05-09 (×3): 1 [drp] via OPHTHALMIC
  Filled 2013-05-08 (×5): qty 1

## 2013-05-08 MED ORDER — FINASTERIDE 5 MG PO TABS
5.0000 mg | ORAL_TABLET | Freq: Every day | ORAL | Status: DC
  Administered 2013-05-09 – 2013-05-10 (×2): 5 mg via ORAL
  Filled 2013-05-08 (×2): qty 1

## 2013-05-08 MED ORDER — FUROSEMIDE 80 MG PO TABS
160.0000 mg | ORAL_TABLET | Freq: Two times a day (BID) | ORAL | Status: DC
  Administered 2013-05-08 – 2013-05-10 (×4): 160 mg via ORAL
  Filled 2013-05-08 (×6): qty 2

## 2013-05-08 NOTE — ED Provider Notes (Signed)
CSN: [id number]     Arrival date & time 05/08/13  1001 History   First MD Initiated Contact with Patient 05/08/13 1122     Chief Complaint  Patient presents with  . Leg Swelling     (Consider location/radiation/quality/duration/timing/severity/associated sxs/prior Treatment) The history is provided by the patient.   patient has had swelling in his right leg for around 2 weeks. He states around 1 week ago he began to get bruising in his right leg. He is on Coumadin but last tetanus check levels 1.7. His dose was increased for one day. No difficulty breathing. No chest pain. No trauma to the knee. He states the pain is worse with movement. His history to fibrillation. He is due to his pacemaker generator change. He was recently started back on amiodarone. No history of DVTs. Patient saw his primary care doctor earlier this week for some pain on his lateral right leg. He states it is Dr. told him that his belt was too tight.  Past Medical History  Diagnosis Date  . Shortness of breath with exertion  . Chronic kidney disease     not on dialysis yet  . Coronary artery disease 05/14/2010    stress test - no scintigraphic evidence of inducible myocardial ischemia,; normal study  . Hypertension   . Arthritis   . Blood dyscrasia     one time had low platlet count  . Dysrhythmia     atrial fib  . SSS (sick sinus syndrome) 08/27/2011    echo EF >55%, severe LAE, moderate RAE, tissue AVR w/ gradients 35 and 17mmHg  . Pericardial effusion 03/12/2011    echo EF 55-60%  . Claudication 02/19/2012    LE doppler no evidence of arterial insufficiency, lower extremities demonstrate normal values  w/ no evidence of insufficiency  . Lupus nephritis   . Pacemaker 09/19/2004    implanted  . Restless leg syndrome   . Cellulitis   . Anemia   . Benign prostatic hypertrophy   . Constipation   . Shoulder joint pain    Past Surgical History  Procedure Laterality Date  . Back surgery    . Appendectomy    .  Hernia repair    . Pacemaker insertion  2006    Medtronic EnRhythm  . Av fistula placement  02/24/2011  . Aortic valve replacement  02/24/2011    pericardial tissue valve [facility]ase  . Coronary artery bypass graft  02/24/2011    LIMA to LAD, SVG to distal RCA  . Cardiac catheterization  02/13/2011    mod/severe aortic valve stenosis w peak to peak gradient 25-32 mmHg,  70% stenosis LAD, 30-40%proximal followed by 90% sstenosis in RCA prior to anterior RV margin branch  . Lumbar laminectomy/decompression microdiscectomy Right 04/16/2012    Procedure: DECOMPRESSIVE L4 - L5/ MICRODISCECTOMY ON THE RIGHT 1 LEVEL;  Surgeon: Jacki Conesonald A Gioffre, MD;  Location: [facility];  Service: Orthopedics;  Laterality: Right;   Family History  Problem Relation Age of Onset  . Hypertension Mother   . Kidney disease Brother    History  Substance Use Topics  . Smoking status: Former Smoker    Types: Cigarettes    Quit date: 02/11/1957  . Smokeless tobacco: Never Used  . Alcohol Use: No    Review of Systems  Constitutional: Negative for activity change and appetite change.  Eyes: Negative for pain.  Respiratory: Negative for chest tightness and shortness of breath.   Cardiovascular: Positive for leg swelling. Negative  for chest pain.  Gastrointestinal: Negative for nausea, vomiting, abdominal pain and diarrhea.  Genitourinary: Negative for flank pain.  Musculoskeletal: Positive for joint swelling. Negative for back pain and neck stiffness.  Skin: Positive for color change. Negative for rash.  Neurological: Negative for weakness, numbness and headaches.  Hematological: Bruises/bleeds easily.  Psychiatric/Behavioral: Negative for behavioral problems.      Allergies  Statins; Aspirin; Feraheme; and Penicillins  Home Medications   Prior to Admission medications   Medication Sig Start Date End Date Taking? Authorizing Provider  amiodarone (PACERONE) 200 MG tablet Take 1 tablet (200 mg total) by  mouth daily. 04/18/13   Mihai Croitoru, MD  amLODipine (NORVASC) 10 MG tablet Take 10 mg by mouth daily.    Historical Provider, MD  aspirin EC 81 MG tablet Take 81 mg by mouth daily.    Historical Provider, MD  calcitRIOL (ROCALTROL) 0.25 MCG capsule Take 0.25 mcg by mouth daily before breakfast.     Historical Provider, MD  Cyanocobalamin (VITAMIN B 12 PO) Take 1 tablet by mouth daily.    Historical Provider, MD  ferrous sulfate 325 (65 FE) MG tablet Take 325 mg by mouth daily with breakfast.    Historical Provider, MD  finasteride (PROSCAR) 5 MG tablet Take 5 mg by mouth daily.    Historical Provider, MD  fish oil-omega-3 fatty acids 1000 MG capsule Take 2 g by mouth daily.    Historical Provider, MD  furosemide (LASIX) 40 MG tablet Take 120 mg by mouth 2 (two) times daily. Takes 3 tablets    Historical Provider, MD  metoprolol (LOPRESSOR) 50 MG tablet Take 25 mg by mouth daily before breakfast.     Historical Provider, MD  Multiple Vitamins-Minerals (OCUVITE PRESERVISION PO) Take 1 tablet by mouth daily.    Historical Provider, MD  polyethylene glycol (MIRALAX / GLYCOLAX) packet Take 17 g by mouth daily.    Historical Provider, MD  rOPINIRole (REQUIP) 1 MG tablet Take 1 tablet (1 mg total) by mouth at bedtime. 03/07/13   Omelia BlackwaterPeter Justin Sumner, DO  warfarin (COUMADIN) 5 MG tablet Take 1 tablet by mouth daily or as directed 04/07/13   Phillips HayKristin Alvstad, RPH-CPP   BP 119/68  Pulse 80  Temp(Src) 98.6 F (37 C) (Oral)  Resp 20  SpO2 95% Physical Exam  Constitutional: He is oriented to person, place, and time. He appears well-developed and well-nourished.  HENT:  Head: Normocephalic.  Neck: Neck supple.  Cardiovascular: Normal rate and regular rhythm.   Pulmonary/Chest: Effort normal.  Abdominal: Soft. There is no tenderness.  Musculoskeletal: He exhibits edema.  Moderate edema of right lower extremity from around midthigh down. There is moderate ecchymosis medially both above and below the  knee. There is some ecchymosis over the calf laterally. Is also a bruise up in the right thigh below the hip. There is some discoloration of skin with some yellowing. Moderate edema without weeping. There is much less edema on the contralateral lower extremity.  Neurological: He is alert and oriented to person, place, and time.  Sensation grossly intact over her foot. Able to wiggle toes. Strong dorsalis pedis pulse.   Skin: Skin is warm.    ED Course  Procedures (including critical care time) Labs Review Labs Reviewed  CBC WITH DIFFERENTIAL - Abnormal; Notable for the following:    RBC 2.70 (*)    Hemoglobin 7.7 (*)    HCT 24.0 (*)    RDW 16.9 (*)    Platelets 110 (*)  All other components within normal limits  PROTIME-INR - Abnormal; Notable for the following:    Prothrombin Time 31.5 (*)    INR 3.19 (*)    All other components within normal limits  I-STAT CHEM 8, ED - Abnormal; Notable for the following:    BUN 63 (*)    Creatinine, Ser 3.40 (*)    Glucose, Bld 105 (*)    Hemoglobin 7.5 (*)    HCT 22.0 (*)    All other components within normal limits    Imaging Review Dg Knee Complete 4 Views Right  05/08/2013   CLINICAL DATA:  Right knee pain, swelling, and bruising.  EXAM: RIGHT KNEE - COMPLETE 4+ VIEW  COMPARISON:  None.  FINDINGS: There is no evidence of fracture, dislocation, or joint effusion. There is no evidence of arthropathy or other focal bone abnormality. Generalized osteopenia noted. Peripheral vascular calcification also demonstrated, with surgical clips in the proximal right leg.  IMPRESSION: No acute findings.   Electronically Signed   By: Myles Rosenthal M.D.   On: 05/08/2013 12:28     EKG Interpretation None      MDM   Final diagnoses:  Anemia  Lower extremity edema  Hematoma of right lower extremity    Patient with swelling in his right lower extremity. Large amount of ecchymosis. Hemoglobin is decreased. Likely hematoma versus bleeding in the leg.  He is on Coumadin and his INRs 3, however has recently been subtherapeutic. There is good distal pulses and good distal sensation. There is pain with movement of leg, however it could be related to just time since. No compartment syndrome is less likely. I will admit to internal medicine for monitoring of his hemoglobin and the compartments. I discussed with radiology and a noncontrast CT scan will be ordered to evaluate for hematoma. Cannot use IV contrast due 2 chronic renal insufficiency   Juliet Rude. Rubin Payor, MD 05/08/13 [id number]

## 2013-05-08 NOTE — ED Notes (Signed)
Dr. Rubin PayorPickering asks that we include all of hematoma in CT of rt lower extremity-distal femur-tib-fib (Knee area)

## 2013-05-08 NOTE — ED Notes (Signed)
Attempted to call report

## 2013-05-08 NOTE — Plan of Care (Cosign Needed)
RN called- pt endorsed Tylenol inadequate for pain control. Chart reviewed and not pt with extensiv eLE hematoma with 2+ pitting edema so Oxy IR added.  555 am: AM Hgb down to 6.9 so will transfuse 2 units PRBCs and repeat CBC after 2nd unit infused (baseline 10)  Junious SilkAllison Ellis, ANP

## 2013-05-08 NOTE — Significant Event (Signed)
Dr Waymon AmatoHongalgi called notified Pt in [phone].

## 2013-05-08 NOTE — ED Notes (Signed)
Patient transported to X-ray 

## 2013-05-08 NOTE — Progress Notes (Signed)
VASCULAR LAB PRELIMINARY  PRELIMINARY  PRELIMINARY  PRELIMINARY  Right lower extremity venous Doppler completed.    Preliminary report:  There is no DVT or SVT noted in the right lower extremity.  Waveforms are pulsatile.  Kern AlbertaCandace R Ayyan Sites, RVT 05/08/2013, 12:46 PM

## 2013-05-08 NOTE — ED Notes (Signed)
Pt presents to department for evaluation of R leg swelling and pain. Ongoing x2 days. Pt states entire leg hurts, pain increases with walking and movement. 7/10 pain upon arrival to ED. Pt is alert and oriented x4. Respirations unlabored.

## 2013-05-08 NOTE — ED Notes (Signed)
venouse doppler being completed at bedside.

## 2013-05-08 NOTE — H&P (Addendum)
History and Physical  Nathaniel Steele ZOX:[id number] DOB: [birth year] DOA: 05/08/2013  Referring physician: EDP PCP: Delorse Lek, MD  Outpatient Specialists:  1. Cardiology: Dr. Rachelle Hora Croitoru 2. Nephrology: Dr. Camille Bal  Chief Complaint: Painful swollen right leg  HPI: Nathaniel Steele is a 80 y.o. male with extensive PMH- stage IV chronic kidney disease, CAD/CABG, HTN, PAF on Coumadin, SSS status post PPM-awaiting battery change early May 2015, bioprosthetic aortic valve, presented to the ED with complaints of painful swollen right lower extremity. He states that he had gradual onset of pain in the right lateral upper thigh approximately 10 days ago. He saw his PCP a week ago-unsure what he was told. His INR on 4/13 was 1.7 and Coumadin dose was increased. Over the last 1 week, he is noted progressively worsening diffuse intermittent pain and diffuse swelling of his right lower extremity. He denies any trauma to that limb or fall. He has intermittent spontaneous bruising of his extremities secondary to Coumadin and notice some around the right knee and thought it was the usual. However since yesterday, bruising around the knee has gotten worse. He also has worsened pain on weightbearing or flexing his knee. He presented to the ED where hemoglobin 7.7 (10 at baseline), creatinine 2.4, platelets 110 and CT of the right knee without contrast shows extensive hemorrhage of right lower extremity. INR 3.19. Hospitalist admission requested.   Review of Systems: All systems reviewed and apart from history of presenting illness, are negative.  Past Medical History  Diagnosis Date  . Shortness of breath with exertion  . Chronic kidney disease     not on dialysis yet  . Coronary artery disease 05/14/2010    stress test - no scintigraphic evidence of inducible myocardial ischemia,; normal study  . Hypertension   . Arthritis   . Blood dyscrasia     one time had low platlet count  . Dysrhythmia      atrial fib  . SSS (sick sinus syndrome) 08/27/2011    echo EF >55%, severe LAE, moderate RAE, tissue AVR w/ gradients 35 and  . Pericardial effusion 03/12/2011    echo EF 55-60%  . Claudication 02/19/2012    LE doppler no evidence of arterial insufficiency, lower extremities demonstrate normal values  w/ no evidence of insufficiency  . Lupus nephritis   . Pacemaker 09/19/2004    implanted  . Restless leg syndrome   . Cellulitis   . Anemia   . Benign prostatic hypertrophy   . Constipation   . Shoulder joint pain    Past Surgical History  Procedure Laterality Date  . Back surgery    . Appendectomy    . Hernia repair    . Pacemaker insertion  2006    Medtronic EnRhythm  . Av fistula placement  02/24/2011  . Aortic valve replacement  02/24/2011    pericardial tissue valve [facility] Ease  . Coronary artery bypass graft  02/24/2011    LIMA to LAD, SVG to distal RCA  . Cardiac catheterization  02/13/2011    mod/severe aortic valve stenosis w peak to peak gradient 25-32 mmHg,  70% stenosis LAD, 30-40%proximal followed by 90% sstenosis in RCA prior to anterior RV margin branch  . Lumbar laminectomy/decompression microdiscectomy Right 04/16/2012    Procedure: DECOMPRESSIVE L4 - L5/ MICRODISCECTOMY ON THE RIGHT 1 LEVEL;  Surgeon: Jacki Cones, MD;  Location: [facility];  Service: Orthopedics;  Laterality: Right;   Social History:  reports that  he quit smoking about 56 years ago. His smoking use included Cigarettes. He smoked 0.00 packs per day. He has never used smokeless tobacco. He reports that he does not drink alcohol or use illicit drugs. Independent of activities of daily living.  Allergies  Allergen Reactions  . Statins     myalgias  . Aspirin Hives    Patient states that he can take the enteric coated but not the uncoated  . Feraheme [Ferumoxytol] Rash and Other (See Comments)    Abdominal pain  . Penicillins Hives    Family History  Problem Relation Age of Onset    . Hypertension Mother   . Kidney disease Brother     Prior to Admission medications   Medication Sig Start Date End Date Taking? Authorizing Provider  amiodarone (PACERONE) 200 MG tablet Take 200 mg by mouth daily. 04/18/13  Yes Mihai Croitoru, MD  amLODipine (NORVASC) 10 MG tablet Take 10 mg by mouth daily.   Yes Historical Provider, MD  aspirin EC 81 MG tablet Take 81 mg by mouth daily.   Yes Historical Provider, MD  calcitRIOL (ROCALTROL) 0.25 MCG capsule Take 0.25 mcg by mouth daily before breakfast.    Yes Historical Provider, MD  Cyanocobalamin (VITAMIN B 12 PO) Take 1 tablet by mouth daily.   Yes Historical Provider, MD  cycloSPORINE (RESTASIS) 0.05 % ophthalmic emulsion 1 drop 2 (two) times daily.   Yes Historical Provider, MD  ferrous sulfate 325 (65 FE) MG tablet Take 325 mg by mouth daily with breakfast.   Yes Historical Provider, MD  finasteride (PROSCAR) 5 MG tablet Take 5 mg by mouth daily.   Yes Historical Provider, MD  fish oil-omega-3 fatty acids 1000 MG capsule Take 2 g by mouth daily.   Yes Historical Provider, MD  furosemide (LASIX) 40 MG tablet Take 160 mg by mouth 2 (two) times daily. Takes 4 tablets   Yes Historical Provider, MD  metoprolol (LOPRESSOR) 50 MG tablet Take 25 mg by mouth daily before breakfast.    Yes Historical Provider, MD  Multiple Vitamins-Minerals (OCUVITE PRESERVISION PO) Take 1 tablet by mouth daily.   Yes Historical Provider, MD  polyethylene glycol (MIRALAX / GLYCOLAX) packet Take 17 g by mouth daily.   Yes Historical Provider, MD  pregabalin (LYRICA) 25 MG capsule Take 25 mg by mouth at bedtime.   Yes Historical Provider, MD  rOPINIRole (REQUIP) 1 MG tablet Take 1 mg by mouth at bedtime. 03/07/13  Yes Omelia BlackwaterPeter Justin Sumner, DO  warfarin (COUMADIN) 5 MG tablet Take 5 mg by mouth daily.   Yes Historical Provider, MD   Physical Exam: Filed Vitals:   05/08/13 1300 05/08/13 1315 05/08/13 1400 05/08/13 1500  BP: 121/61 119/68 118/58 119/67  Pulse: 80   78 72  Temp:    98.6 F (37 C)  TempSrc:    Oral  Resp: 17 20 17 18   Height:    5\' 7"  (1.702 m)  Weight:    77.111 kg (170 lb)  SpO2: 93% 95% 93% 96%     General exam: Moderately built and nourished elderly male patient, lying comfortably supine in bed in no obvious distress.  Head, eyes and ENT: Nontraumatic and normocephalic. Pupils equally reacting to light and accommodation. Oral mucosa moist.  Neck: Supple. No JVD, carotid bruit or thyromegaly.  Lymphatics: No lymphadenopathy.  Respiratory system: Clear to auscultation. No increased work of breathing.  Cardiovascular system: S1 and S2 heard, RRR. No JVD, gallops, clicks. Trace left ankle edema.  Right lower extremity exam as below. Grade 3/6 systolic ejection murmur at the apex.  Gastrointestinal system: Abdomen is nondistended, soft and nontender. Normal bowel sounds heard. No organomegaly or masses appreciated.  Central nervous system: Alert and oriented. No focal neurological deficits.  Extremities: Symmetric 5 x 5 power. Right lower extremity is diffusely swollen below the groin with extensive patchy ecchymosis around her right knee more so on the anteromedial and anterolateral aspect. 2+ pitting edema of right leg and foot. Good dorsalis pedis and posterior tibial pulsations. No color change of his feet and toes and no motor deficits. Extensive chronic bruising changes of skin of upper extremities.  Skin: No rashes or acute findings.  Musculoskeletal system: Negative exam.  Psychiatry: Pleasant and cooperative.   Labs on Admission:  Basic Metabolic Panel:  Recent Labs Lab 05/08/13 1254  NA 141  K 4.2  CL 106  GLUCOSE 105*  BUN 63*  CREATININE 3.40*   Liver Function Tests: No results found for this basename: AST, ALT, ALKPHOS, BILITOT, PROT, ALBUMIN,  in the last 168 hours No results found for this basename: LIPASE, AMYLASE,  in the last 168 hours No results found for this basename: AMMONIA,  in the last  168 hours CBC:  Recent Labs Lab 05/08/13 1240 05/08/13 1254  WBC 6.0  --   NEUTROABS 4.5  --   HGB 7.7* 7.5*  HCT 24.0* 22.0*  MCV 88.9  --   PLT 110*  --    Cardiac Enzymes: No results found for this basename: CKTOTAL, CKMB, CKMBINDEX, TROPONINI,  in the last 168 hours  BNP (last 3 results) No results found for this basename: PROBNP,  in the last 8760 hours CBG: No results found for this basename: GLUCAP,  in the last 168 hours  Radiological Exams on Admission: Ct Knee Right Wo Contrast  05/08/2013   CLINICAL DATA:  Right knee swelling and discoloration. Blood thinner use. Knee pain.  EXAM: CT OF THE RIGHT KNEE WITHOUT CONTRAST  TECHNIQUE: Multidetector CT imaging was performed according to the standard protocol. Multiplanar CT image reconstructions were also generated.  COMPARISON:  05/08/2013  FINDINGS: A large field of view was performed as requested, including 19 cm above the knee joint and 19 cm below the knee joint.  On the top most images, we demonstrate the bottom of a suspected hematoma in the vastus lateralis, as shown on image 54 of series 8 and image 1 of series 4, but this is in the upper thigh region and not close to the knee.  There is subcutaneous edema in the upper thigh, around the knee (most confluent anteriorly) and diffusely in the upper calf. The edema along the anterior knee anterior to the patella is high in density and accordingly could certainly represent subcutaneous hematoma. Trace knee joint effusion. No obvious effacement of muscular compartments around the knee.  Bony demineralization noted. No fracture identified. Arterial atherosclerotic calcification is present.  On image 95 of series 4, there is some focal indistinct high density in the subcutaneous tissues potentially reflecting a second focus of poorly marginated bruising/hematoma.  IMPRESSION: 1. High subcutaneous density anterior to the patella and along the anterolateral and anteromedial knee could  represent subcutaneous hematoma. Possible bruising/ small subcutaneous hematoma along the proximal anteromedial calf. There is diffuse edema in the subcutaneous tissues of the lower half of the thigh and upper half of the calf. 2. On the very top most images, about 19 cm proximal to the knee joint, there is an  appearance suggesting possible hematoma in the vastus lateralis muscle. Only the bottom most extent of this process was captured on today's exam, despite the use of a very large field of view around the knee.   Electronically Signed   By: Herbie BaltimoreWalt  Liebkemann M.D.   On: 05/08/2013 15:18   Dg Knee Complete 4 Views Right  05/08/2013   CLINICAL DATA:  Right knee pain, swelling, and bruising.  EXAM: RIGHT KNEE - COMPLETE 4+ VIEW  COMPARISON:  None.  FINDINGS: There is no evidence of fracture, dislocation, or joint effusion. There is no evidence of arthropathy or other focal bone abnormality. Generalized osteopenia noted. Peripheral vascular calcification also demonstrated, with surgical clips in the proximal right leg.  IMPRESSION: No acute findings.   Electronically Signed   By: Myles RosenthalJohn  Stahl M.D.   On: 05/08/2013 12:28    EKG: Independently reviewed. AV paced rhythm  Assessment/Plan Principal Problem:   Hematoma of right lower extremity Active Problems:   Chronic renal failure, pt has AVF LUE   SLE (systemic lupus erythematosus)   S/P CABG x 2: (LIMA-LAD  SVG - RCA)   AVR 23 mm bioprosthesis   PAF (paroxysmal atrial fibrillation), on Amiodarone prior to admission   Pacemaker, MDT MDT implanted 08/06, ERI, pacer changed back to correct settings 04/22/13   Acute post-hemorrhagic anemia   Warfarin-induced coagulopathy   Hematoma of lower limb   1. Extensive hematoma RLE: Secondary to Coumadin coagulopathy. No history of trauma. No clinical features suggestive of compartment syndrome. Elevate right lower extremity and monitor closely. Treat underlying cause. 2. Acute post hemorrhagic anemia  complicating underlying chronic anemia: Secondary to problem #1. Monitor CBCs closely and transfuse if hemoglobin less than 7 g per DL. 3. Coumadin coagulopathy: Hold Coumadin. Follow INR in a.m. Will not reverse coagulopathy and let INR drift down spontaneously, unless worsening symptoms/compartment syndrome/worsening anemia in which case will need aggressive reversal and vascular surgery/ortho consultation. 4. Stage IV chronic kidney disease: Creatinine at baseline. Follow up BMP in a.m. 5. History of PAF/PPM/CAD/CABG: Paced rhythm. Continue home medications. Alerted cardiology of patient's admission. Patient wishes to know if his pacemaker will be changed during this admission. 6. History of bioprosthetic aortic valve: AC management as above. 7. Hypertension: Controlled     Code Status: Full  Family Communication: Discussed with spouse and son at bedside  Disposition Plan: Home when medically stable   Time spent: 60 minutes  Elease EtienneAnand D Hongalgi, MD, FACP, [facility] HospitalFHM. [facility] Pager [phone]  If 7PM-7AM, please contact night-coverage [email or web address] Password [email or web address] 05/08/2013, 5:13 PM

## 2013-05-09 DIAGNOSIS — Z7901 Long term (current) use of anticoagulants: Secondary | ICD-10-CM

## 2013-05-09 DIAGNOSIS — Z950 Presence of cardiac pacemaker: Secondary | ICD-10-CM

## 2013-05-09 DIAGNOSIS — D649 Anemia, unspecified: Secondary | ICD-10-CM

## 2013-05-09 LAB — CBC
HCT: 21.6 % — ABNORMAL LOW (ref 39.0–52.0)
HCT: 21.4 % — ABNORMAL LOW (ref 39.0–52.0)
HEMOGLOBIN: 7.1 g/dL — AB (ref 13.0–17.0)
Hemoglobin: 6.9 g/dL — CL (ref 13.0–17.0)
MCH: 29 pg (ref 26.0–34.0)
MCH: 28.3 pg (ref 26.0–34.0)
MCHC: 32.9 g/dL (ref 30.0–36.0)
MCHC: 32.2 g/dL (ref 30.0–36.0)
MCV: 88.2 fL (ref 78.0–100.0)
MCV: 87.7 fL (ref 78.0–100.0)
PLATELETS: 105 10*3/uL — AB (ref 150–400)
Platelets: 99 10*3/uL — ABNORMAL LOW (ref 150–400)
RBC: 2.45 MIL/uL — ABNORMAL LOW (ref 4.22–5.81)
RBC: 2.44 MIL/uL — ABNORMAL LOW (ref 4.22–5.81)
RDW: 16.6 % — AB (ref 11.5–15.5)
RDW: 16.9 % — AB (ref 11.5–15.5)
WBC: 6 10*3/uL (ref 4.0–10.5)
WBC: 5 10*3/uL (ref 4.0–10.5)

## 2013-05-09 LAB — BASIC METABOLIC PANEL
BUN: 65 mg/dL — ABNORMAL HIGH (ref 6–23)
CALCIUM: 9.3 mg/dL (ref 8.4–10.5)
CO2: 23 mEq/L (ref 19–32)
Chloride: 106 mEq/L (ref 96–112)
Creatinine, Ser: 3.03 mg/dL — ABNORMAL HIGH (ref 0.50–1.35)
GFR calc Af Amer: 20 mL/min — ABNORMAL LOW (ref >90)
GFR, EST NON AFRICAN AMERICAN: 18 mL/min — AB (ref >90)
Glucose, Bld: 96 mg/dL (ref 70–99)
Potassium: 4.2 mEq/L (ref 3.7–5.3)
SODIUM: 144 meq/L (ref 137–147)

## 2013-05-09 LAB — PROTIME-INR
INR: 3.02 — ABNORMAL HIGH (ref 0.00–1.49)
Prothrombin Time: 30.2 seconds — ABNORMAL HIGH (ref 11.6–15.2)

## 2013-05-09 LAB — PREPARE RBC (CROSSMATCH)

## 2013-05-09 MED ORDER — SODIUM CHLORIDE 0.9 % IV SOLN
INTRAVENOUS | Status: DC
Start: 2013-05-09 — End: 2013-05-10
  Administered 2013-05-10: 06:00:00 via INTRAVENOUS

## 2013-05-09 MED ORDER — GENTAMICIN SULFATE 40 MG/ML IJ SOLN
80.0000 mg | INTRAMUSCULAR | Status: DC
  Filled 2013-05-09: qty 2

## 2013-05-09 MED ORDER — VANCOMYCIN HCL IN DEXTROSE 1-5 GM/200ML-% IV SOLN
1000.0000 mg | INTRAVENOUS | Status: DC
  Filled 2013-05-09: qty 200

## 2013-05-09 MED ORDER — WARFARIN - PHARMACIST DOSING INPATIENT: Freq: Every day | Status: DC

## 2013-05-09 NOTE — Progress Notes (Signed)
ANTICOAGULATION CONSULT NOTE - Initial Consult  Pharmacy Consult for Coumadin Indication: atrial fibrillation  Allergies  Allergen Reactions  . Statins     myalgias  . Aspirin Hives    Patient states that he can take the enteric coated but not the uncoated  . Feraheme [Ferumoxytol] Rash and Other (See Comments)    Abdominal pain  . Penicillins Hives  Patient Measurements: Height: 5\' 7"  (170.2 cm) Weight: 170 lb 6.7 oz (77.3 kg) IBW/kg (Calculated) : 66.1 Vital Signs: Temp: 97.8 F (36.6 C) (04/20 0706) Temp src: Oral (04/20 0706) BP: 127/60 mmHg (04/20 0706) Pulse Rate: 75 (04/20 0706) Labs:  Recent Labs  05/08/13 1240 05/08/13 1254 05/08/13 1905 05/08/13 2317 05/09/13 0408  HGB 7.7* 7.5* 7.6* 7.1* 6.9*  HCT 24.0* 22.0* 23.8* 21.6* 21.4*  PLT 110*  --  122* 99* 105*  LABPROT 31.5*  --   --   --  30.2*  INR 3.19*  --   --   --  3.02*  CREATININE  --  3.40*  --   --  3.03*   Estimated Creatinine Clearance: 17 ml/min (by C-G formula based on Cr of 3.03).  Assessment: [age] YOM admitted with RLE hematoma + acute blood loss anemia on chronic Coumadin therapy. Home regimen was 5mg  daily except 7.5mg  on Mondays (changed 4/13 per clinic notes due to low INR of 1.7).  INR was supra-therapeutic on admission (3.19) and now 3.02.   Goal of Therapy:  INR 2-3 Monitor platelets by anticoagulation protocol: Yes   Plan:  Hold Coumadin - continue to follow with physician for safe restart.  Daily INR  Link SnufferJessica Virgie Chery, PharmD, BCPS Clinical Pharmacist [phone] 05/09/2013,9:58 AM

## 2013-05-09 NOTE — Progress Notes (Signed)
UR completed 

## 2013-05-09 NOTE — Progress Notes (Signed)
[facility] PROGRESS NOTE Interim History: 80 y.o. male with extensive PMH- stage IV chronic kidney disease, CAD/CABG, HTN, PAF on Coumadin, PCP a week ago-unsure what he was told. His INR on 4/13 was 1.7 and Coumadin dose was increased. Over the last 1 week, he is noted progressively worsening diffuse intermittent pain and diffuse swelling of his right lower extremity. He denies any trauma to that limb or fall. He has intermittent spontaneous bruising of his extremities secondary to Coumadin and notice some around the right knee and thought it was the usual. However since yesterday, bruising around the knee has gotten worse  Assessment/Plan: Hematoma of right lower extremity: - due to increase in coumadin. - Mild improvement in INR. - Hbg < 7.0 transfuse 1 units  Warfarin-induced coagulopathy: - improving. - let ir drift down.   Acute post-hemorrhagic anemia - transfuse 1 unit of PRBC Hbg <7.0.  Stage IV chronic kidney disease:  - Creatinine at baseline. Follow up BMP in a.m.   History of PAF/PPM/CAD/CABG: Paced rhythm.  - Continue home medications.  - History of bioprosthetic aortic valve: AC management as above  AVR 23 mm bioprosthesis - on coumadin.  Chronic renal failure, pt has AVF LUE - At baseline.  SLE (systemic lupus erythematosus) - stable.    Code Status: Full  Family Communication: Discussed with spouse and son at bedside  Disposition Plan: Home when medically stable    Consultants:  none  Procedures:  CTknee  Antibiotics:  None  HPI/Subjective: Relates leg about the same.  Objective: Filed Vitals:   05/08/13 1500 05/08/13 1759 05/08/13 2132 05/09/13 0706  BP: 119/67 118/48 100/58 127/60  Pulse: 72 80 67 75  Temp: 98.6 F (37 C) 98.6 F (37 C) 98.6 F (37 C) 97.8 F (36.6 C)  TempSrc: Oral Oral Oral Oral  Resp: 18 15 14 16   Height: 5\' 7"  (1.702 m)     Weight: 77.111 kg (170 lb)   77.3 kg (170 lb 6.7 oz)  SpO2: 96% 91% 92%  100%    Intake/Output Summary (Last 24 hours) at 05/09/13 0848 Last data filed at 05/09/13 0300  Gross per 24 hour  Intake    300 ml  Output    925 ml  Net   -625 ml   Filed Weights   05/08/13 1500 05/09/13 0706  Weight: 77.111 kg (170 lb) 77.3 kg (170 lb 6.7 oz)    Exam:  General: Alert, awake, oriented x3, in no acute distress.  HEENT: No bruits, no goiter.  Heart: Regular rate and rhythm, without murmurs, rubs, gallops.  Lungs: Good air movement, clear Abdomen: Soft, nontender, nondistended, positive bowel sounds.    Data Reviewed: Basic Metabolic Panel:  Recent Labs Lab 05/08/13 1254 05/09/13 0408  NA 141 144  K 4.2 4.2  CL 106 106  CO2  --  23  GLUCOSE 105* 96  BUN 63* 65*  CREATININE 3.40* 3.03*  CALCIUM  --  9.3   Liver Function Tests: No results found for this basename: AST, ALT, ALKPHOS, BILITOT, PROT, ALBUMIN,  in the last 168 hours No results found for this basename: LIPASE, AMYLASE,  in the last 168 hours No results found for this basename: AMMONIA,  in the last 168 hours CBC:  Recent Labs Lab 05/08/13 1240 05/08/13 1254 05/08/13 1905 05/08/13 2317 05/09/13 0408  WBC 6.0  --  6.2 6.0 5.0  NEUTROABS 4.5  --   --   --   --   HGB 7.7*  7.5* 7.6* 7.1* 6.9*  HCT 24.0* 22.0* 23.8* 21.6* 21.4*  MCV 88.9  --  88.8 88.2 87.7  PLT 110*  --  122* 99* 105*   Cardiac Enzymes: No results found for this basename: CKTOTAL, CKMB, CKMBINDEX, TROPONINI,  in the last 168 hours BNP (last 3 results) No results found for this basename: PROBNP,  in the last 8760 hours CBG: No results found for this basename: GLUCAP,  in the last 168 hours  No results found for this or any previous visit (from the past 240 hour(s)).   Studies: Ct Knee Right Wo Contrast  05/08/2013   CLINICAL DATA:  Right knee swelling and discoloration. Blood thinner use. Knee pain.  EXAM: CT OF THE RIGHT KNEE WITHOUT CONTRAST  TECHNIQUE: Multidetector CT imaging was performed according to the  standard protocol. Multiplanar CT image reconstructions were also generated.  COMPARISON:  05/08/2013  FINDINGS: A large field of view was performed as requested, including 19 cm above the knee joint and 19 cm below the knee joint.  On the top most images, we demonstrate the bottom of a suspected hematoma in the vastus lateralis, as shown on image 54 of series 8 and image 1 of series 4, but this is in the upper thigh region and not close to the knee.  There is subcutaneous edema in the upper thigh, around the knee (most confluent anteriorly) and diffusely in the upper calf. The edema along the anterior knee anterior to the patella is high in density and accordingly could certainly represent subcutaneous hematoma. Trace knee joint effusion. No obvious effacement of muscular compartments around the knee.  Bony demineralization noted. No fracture identified. Arterial atherosclerotic calcification is present.  On image 95 of series 4, there is some focal indistinct high density in the subcutaneous tissues potentially reflecting a second focus of poorly marginated bruising/hematoma.  IMPRESSION: 1. High subcutaneous density anterior to the patella and along the anterolateral and anteromedial knee could represent subcutaneous hematoma. Possible bruising/ small subcutaneous hematoma along the proximal anteromedial calf. There is diffuse edema in the subcutaneous tissues of the lower half of the thigh and upper half of the calf. 2. On the very top most images, about 19 cm proximal to the knee joint, there is an appearance suggesting possible hematoma in the vastus lateralis muscle. Only the bottom most extent of this process was captured on today's exam, despite the use of a very large field of view around the knee.   Electronically Signed   By: Herbie BaltimoreWalt  Liebkemann M.D.   On: 05/08/2013 15:18   Dg Knee Complete 4 Views Right  05/08/2013   CLINICAL DATA:  Right knee pain, swelling, and bruising.  EXAM: RIGHT KNEE - COMPLETE  4+ VIEW  COMPARISON:  None.  FINDINGS: There is no evidence of fracture, dislocation, or joint effusion. There is no evidence of arthropathy or other focal bone abnormality. Generalized osteopenia noted. Peripheral vascular calcification also demonstrated, with surgical clips in the proximal right leg.  IMPRESSION: No acute findings.   Electronically Signed   By: Myles RosenthalJohn  Stahl M.D.   On: 05/08/2013 12:28    Scheduled Meds: . amiodarone  200 mg Oral Daily  . amLODipine  10 mg Oral Daily  . aspirin EC  81 mg Oral Daily  . calcitRIOL  0.25 mcg Oral QAC breakfast  . cycloSPORINE  1 drop Both Eyes BID  . ferrous sulfate  325 mg Oral Q breakfast  . finasteride  5 mg Oral Daily  .  furosemide  160 mg Oral BID  . metoprolol  25 mg Oral QAC breakfast  . omega-3 acid ethyl esters  2 g Oral Daily  . polyethylene glycol  17 g Oral Daily  . pregabalin  25 mg Oral QHS  . rOPINIRole  1 mg Oral QHS  . sodium chloride  3 mL Intravenous Q12H   Continuous Infusions:    Marinda ElkAbraham Feliz Ortiz  [facility] Pager [phone]. If 8PM-8AM, please contact night-coverage at [email or web address], password [email or web address] 05/09/2013, 8:48 AM  LOS: 1 day

## 2013-05-10 ENCOUNTER — Encounter (HOSPITAL_COMMUNITY)
Admission: EM | Disposition: A | Discharge status: Home / Self Care | Payer: Self-pay | Source: Home / Self Care | Attending: Internal Medicine

## 2013-05-10 DIAGNOSIS — R001 Bradycardia, unspecified: Secondary | ICD-10-CM

## 2013-05-10 DIAGNOSIS — I495 Sick sinus syndrome: Secondary | ICD-10-CM

## 2013-05-10 DIAGNOSIS — Z45010 Encounter for checking and testing of cardiac pacemaker pulse generator [battery]: Secondary | ICD-10-CM

## 2013-05-10 DIAGNOSIS — J9600 Acute respiratory failure, unspecified whether with hypoxia or hypercapnia: Secondary | ICD-10-CM

## 2013-05-10 DIAGNOSIS — Z951 Presence of aortocoronary bypass graft: Secondary | ICD-10-CM

## 2013-05-10 HISTORY — PX: PERMANENT PACEMAKER GENERATOR CHANGE: SHX6022

## 2013-05-10 LAB — CBC
HEMATOCRIT: 26.6 % — AB (ref 39.0–52.0)
Hemoglobin: 8.7 g/dL — ABNORMAL LOW (ref 13.0–17.0)
MCH: 28.2 pg (ref 26.0–34.0)
MCHC: 32.7 g/dL (ref 30.0–36.0)
MCV: 86.4 fL (ref 78.0–100.0)
PLATELETS: 119 10*3/uL — AB (ref 150–400)
RBC: 3.08 MIL/uL — ABNORMAL LOW (ref 4.22–5.81)
RDW: 17.7 % — ABNORMAL HIGH (ref 11.5–15.5)
WBC: 5.9 10*3/uL (ref 4.0–10.5)

## 2013-05-10 LAB — SURGICAL PCR SCREEN
MRSA, PCR: NEGATIVE
Staphylococcus aureus: NEGATIVE

## 2013-05-10 LAB — PROTIME-INR
INR: 1.85 — AB (ref 0.00–1.49)
Prothrombin Time: 20.8 seconds — ABNORMAL HIGH (ref 11.6–15.2)

## 2013-05-10 SURGERY — PERMANENT PACEMAKER GENERATOR CHANGE
Anesthesia: LOCAL

## 2013-05-10 MED ORDER — CHLORHEXIDINE GLUCONATE CLOTH 2 % EX PADS
6.0000 | MEDICATED_PAD | Freq: Once | CUTANEOUS | Status: AC
  Administered 2013-05-10: 6 via TOPICAL

## 2013-05-10 MED ORDER — LIDOCAINE HCL (PF) 1 % IJ SOLN
INTRAMUSCULAR | Status: AC
  Filled 2013-05-10: qty 60

## 2013-05-10 MED ORDER — SODIUM CHLORIDE 0.9 % IV SOLN: INTRAVENOUS | Status: AC

## 2013-05-10 MED ORDER — WARFARIN SODIUM 5 MG PO TABS
5.0000 mg | ORAL_TABLET | Freq: Once | ORAL | Status: DC
  Filled 2013-05-10: qty 1

## 2013-05-10 MED ORDER — FENTANYL CITRATE 0.05 MG/ML IJ SOLN
INTRAMUSCULAR | Status: AC
  Filled 2013-05-10: qty 2

## 2013-05-10 MED ORDER — MIDAZOLAM HCL 5 MG/5ML IJ SOLN
INTRAMUSCULAR | Status: AC
  Filled 2013-05-10: qty 5

## 2013-05-10 MED ORDER — VANCOMYCIN HCL IN DEXTROSE 1-5 GM/200ML-% IV SOLN
1000.0000 mg | Freq: Two times a day (BID) | INTRAVENOUS | Status: DC
  Filled 2013-05-10: qty 200

## 2013-05-10 MED ORDER — HEPARIN (PORCINE) IN NACL 2-0.9 UNIT/ML-% IJ SOLN
INTRAMUSCULAR | Status: AC
  Filled 2013-05-10: qty 500

## 2013-05-10 NOTE — Discharge Instructions (Signed)
Pacemaker Implantation Care After Refer to this sheet over the next few weeks. These instructions provide you with information on caring for the pacemaker after implantation. Your caregiver may also give you more specific instructions. Your treatment has been planned according to current medical practices, but problems sometimes occur. Call your caregiver if you have any problems or questions regarding your pacemaker.  HOME CARE INSTRUCTIONS  Always carry your pacemaker identification card with you. The card should list the implant date, device model, and manufacturer.  Keep the incision site dry for 2 3 days after the procedure or as told by your caregiver. It takes several weeks for the incision to completely heal.  Do not raise your upper arms above your shoulders for a week after the procedure or as told by your caregiver.  Avoid sudden jerking, pulling, or chopping movements that pull your arms away from your body for 6 weeks or as told by your caregiver. For example, do not play golf for 6 weeks or as told by your caregiver.  Take medicine as told by your caregiver.  Wear a medical alert bracelet.  Learn how to check your pulse. Follow directions about when to call for help or be concerned.  Exercise as told by your caregiver.  You may travel by airplane. Tell security you have a pacemaker before going through a metal detector.  Avoid strong electromagnetic fields. You may not be able to have an MRI scan because of the strong magnets used during that test.  Do not place pressure over the area where the pacermaker is.  When using your cell phone, hold it to the ear opposite of the pacemaker. Do not leave your cell phone in a pocket over the pacemaker.  Have your pacemaker checked every 3 6 months or as directed by your caregiver. There is a battery within the generater that lasts about 5 years. Most pacemakers do not give a warning signal when the battery is running low on  power. Pacemaker Information  Changing the battery means removing the old generator through the original incision site and plugging the existing wires into a new generator.  Electrocardiography and heart rhythm monitoring may be done to see if your pacemaker is working properly.  Some pacemakers come with a home monitoring system that provides your caregiver with an ongoing status of your pacemaker and alerts your caregiver if there is a problem.  Home appliances do not interfere with your pacemaker. SEEK IMMEDIATE MEDICAL CARE IF:  You have swelling of the arm that is on the same side as the pacemaker.  You have any type of drainage coming from the pacemaker incision site.  You have skin redness or warmth over the pacemaker insertion site.  You have dizzy spells, feel weak, faint, or pass out.  You have chest pain or shortness of breath.  You were physically injured and think your pacemaker may have been damaged.  You feel your heart skipping beats or beating irregularly.  You are suddenly very tired or have pain in your upper back. Document Released: 07/26/2004 Document Revised: 10/01/2011 Document Reviewed: 05/09/2011 [facility] Patient Information 2014 [place], [place].

## 2013-05-10 NOTE — Op Note (Signed)
Procedure report  Procedure performed:  1. Dual chamber pacemaker generator changeout  2. Light sedation  Reason for procedure:  1. Device generator at elective replacement interval  2. SSS with symptomatic sinus bradycardia 3. Tachy-Brady syndrome Procedure performed by:  Thurmon FairMihai Rakim Moone, MD  Complications:  None  Estimated blood loss:  <5 mL  Medications administered during procedure:  Vancomycin 1 g intravenously, lidocaine 1% 30 mL locally, fentanyl 100 mcg intravenously, Versed 1 mg intravenously Device details:   New Generator Medtronic Adapta model number V , serial number [id number] Right atrial lead (chronic) Medtronic 5594, serial [id number] (implanted 09/19/2004) Right ventricular lead (chronic)  Medtronic 5092,serial number [id number], (implanted 09/19/2004)  Explanted generator Medtronic Enrhythm,  model number P1501DR, serial number  [id number] (implanted 09/19/2004)  Procedure details:  After the risks and benefits of the procedure were discussed the patient provided informed consent. She was brought to the cardiac catheter lab in the fasting state. The patient was prepped and draped in usual sterile fashion. Local anesthesia with 1% lidocaine was administered to to the left infraclavicular area. A 5-6 cm horizontal incision was made parallel with and 2-3 cm caudal to the right clavicle, in the area of an old scar. An older scar was seen closer to the right clavicle. Using minimal electrocautery and mostly sharp and blunt dissection the prepectoral pocket was opened carefully to avoid injury to the loops of chronic leads. Extensive dissection was not necessary. The device was explanted. The pocket was carefully inspected for hemostasis and flushed with copious amounts of antibiotic solution.  The leads were disconnected from the old generator and testing of the lead parameters later showed excellent values. The new generator was connected to the chronic leads, with  appropriate pacing noted.   The entire system was then carefully inserted in the pocket with care been taking that the leads and device assumed a comfortable position without pressure on the incision. Great care was taken that the leads be located deep to the generator. The pocket was then closed in layers using 2 layers of 2-0 Vicryl and cutaneous staples after which a sterile dressing was applied.   At the end of the procedure the following lead parameters were encountered:   Right atrial lead sensed P waves 0.4 mV (atrial fibrillation), impedance 360 ohms, threshold COULD NOT BE TESTED  Right ventricular lead sensed R waves  13.0 mV, impedance 458 ohms, threshold 1.4 at 0.5 ms pulse width.   Thurmon FairMihai Keanon Bevins, MD, [facility] HospitalFACC [facility] [phone] office [phone] pager

## 2013-05-10 NOTE — Interval H&P Note (Signed)
History and Physical Interval Note:  05/10/2013 7:59 AM  Nathaniel Steele  has presented today for surgery, with the diagnosis of Severe sinus bradycardia, symptomatic and Pacemaker Battery Depletion  The various methods of treatment have been discussed with the patient and family. After consideration of risks, benefits and other options for treatment, the patient has consented to  Procedure(s): PERMANENT PACEMAKER GENERATOR CHANGE (N/A) as a surgical intervention .  The patient's history has been reviewed, patient examined, no change in status, stable for surgery.  I have reviewed the patient's chart and labs.  Questions were answered to the patient's satisfaction.     Anias Bartol

## 2013-05-10 NOTE — Progress Notes (Signed)
Hgb result 8.7.

## 2013-05-10 NOTE — Progress Notes (Signed)
Pt a/o, no c/o pain, pt had pacemaker generator change out, pt has dressing to right chest with small amt of sanguenous drainage, no c/o pain, pt given post care instructions about arm restrictions and sling applied to right arm.  Pt being d/c to home with wife, dc instructions reviewed, pt and wife verbalized understanding, pt stable

## 2013-05-10 NOTE — Progress Notes (Signed)
Blood transfusion complete at 0345. CBC ordered. Patient tolerated blood and has had signs or symptoms of difficulty breathing, or adverse reaction to blood. Pt up to the restroom at this time. Will continue to monitor patient to end of shift.

## 2013-05-10 NOTE — Progress Notes (Signed)
Orthopedic Tech Progress Note Patient Details:  Nathaniel Steele 02/02/1926 [id number]  Ortho Devices Type of Ortho Device: Arm sling Ortho Device/Splint Location: arm sling right upper extremity Ortho Device/Splint Interventions: Application   Early CharsWilliam Anthony Zonya Gudger 05/10/2013, 3:05 PM

## 2013-05-10 NOTE — Discharge Summary (Signed)
Physician Discharge Summary  Nathaniel Steele WUJ:[id number] DOB: 10/10/1926 DOA: 05/08/2013  PCP: Delorse LekBURNETT,BRENT A, MD  Admit date: 05/08/2013 Discharge date: 05/10/2013  Time spent: 35 minutes  Recommendations for Outpatient Follow-up:  1. Follow up with PCP in 1 week. 2. INR check this week  Discharge Diagnoses:  Principal Problem:   Hematoma of right lower extremity Active Problems:   Chronic renal failure, pt has AVF LUE   SLE (systemic lupus erythematosus)   S/P CABG x 2: (LIMA-LAD  SVG - RCA)   AVR 23 mm bioprosthesis   PAF (paroxysmal atrial fibrillation), on Amiodarone prior to admission   Pacemaker, MDT MDT implanted 08/06, ERI, pacer changed back to correct settings 04/22/13   Acute post-hemorrhagic anemia   Warfarin-induced coagulopathy   Hematoma of lower limb   SSS (sick sinus syndrome)   Symptomatic bradycardia   Pacemaker battery depletion   Discharge Condition: stable  Diet recommendation: regular  Filed Weights   05/08/13 1500 05/09/13 0706 05/10/13 0518  Weight: 77.111 kg (170 lb) 77.3 kg (170 lb 6.7 oz) 78.155 kg (172 lb 4.8 oz)    History of present illness:  80 y.o. male with extensive PMH- stage IV chronic kidney disease, CAD/CABG, HTN, PAF on Coumadin, SSS status post PPM-awaiting battery change early May 2015, bioprosthetic aortic valve, presented to the ED with complaints of painful swollen right lower extremity. He states that he had gradual onset of pain in the right lateral upper thigh approximately 10 days ago. He saw his PCP a week ago-unsure what he was told. His INR on 4/13 was 1.7 and Coumadin dose was increased. Over the last 1 week, he is noted progressively worsening diffuse intermittent pain and diffuse swelling of his right lower extremity. He denies any trauma to that limb or fall. He has intermittent spontaneous bruising of his extremities secondary to Coumadin and notice some around the right knee and thought it was the usual. However since  yesterday, bruising around the knee has gotten worse. He also has worsened pain on weightbearing or flexing his knee. He presented to the ED where hemoglobin 7.7 (10 at baseline), creatinine 2.4, platelets 110 and CT of the right knee without contrast shows extensive hemorrhage of right lower extremity. INR 3.19. Hospitalist admission requested.      Hospital Course:  Hematoma of right lower extremity:  - due to increase in coumadin. Pain controlled. - INR held. - Mild drop in Hbg, transfuse 2 units of PRBC.  Warfarin-induced coagulopathy:  - Resume coumadin as an outpatient. - INR check this week.  Acute post-hemorrhagic anemia  - transfuse 2 unit of PRBC Hbg <7.0.  - Hbg on d/c 8.7.  Stage IV chronic kidney disease:  - Creatinine at baseline. Follow up BMP in a.m.   History of PAF/PPM/CAD/CABG: Paced rhythm.  - Continue home medications.  - s/p Device generator at elective replacement interval  On 4.21.2015. - History of bioprosthetic aortic valve: resume coumadin.  AVR 23 mm bioprosthesis  - on coumadin.   Procedures: Dual chamber pacemaker generator changeout  4.21.2015.  Consultations:  cardiology  Discharge Exam: Filed Vitals:   05/10/13 0823  BP:   Pulse: 135  Temp:   Resp:     General: A&O x3 Cardiovascular: RRR Respiratory: good air movement CTA B/L  Discharge Instructions You were cared for by a hospitalist during your hospital stay. If you have any questions about your discharge medications or the care you received while you were in the hospital after you  are discharged, you can call the unit and asked to speak with the hospitalist on call if the hospitalist that took care of you is not available. Once you are discharged, your primary care physician will handle any further medical issues. Please note that NO REFILLS for any discharge medications will be authorized once you are discharged, as it is imperative that you return to your primary care physician  (or establish a relationship with a primary care physician if you do not have one) for your aftercare needs so that they can reassess your need for medications and monitor your lab values.  Discharge Orders   Future Appointments Provider Department Dept Phone   05/23/2013 9:50 AM Phillips HayKristin Alvstad, RPH-CPP [facility] [phone]   07/05/2013 1:30 PM Omelia BlackwaterPeter Justin Sumner, DO [facility] [phone]   Future Orders Complete By Expires   Diet - low sodium heart healthy  As directed    Increase activity slowly  As directed        Medication List         amiodarone 200 MG tablet  Commonly known as:  PACERONE  Take 200 mg by mouth daily.     amLODipine 10 MG tablet  Commonly known as:  NORVASC  Take 10 mg by mouth daily.     aspirin EC 81 MG tablet  Take 81 mg by mouth daily.     calcitRIOL 0.25 MCG capsule  Commonly known as:  ROCALTROL  Take 0.25 mcg by mouth daily before breakfast.     cycloSPORINE 0.05 % ophthalmic emulsion  Commonly known as:  RESTASIS  1 drop 2 (two) times daily.     ferrous sulfate 325 (65 FE) MG tablet  Take 325 mg by mouth daily with breakfast.     finasteride 5 MG tablet  Commonly known as:  PROSCAR  Take 5 mg by mouth daily.     fish oil-omega-3 fatty acids 1000 MG capsule  Take 2 g by mouth daily.     furosemide 40 MG tablet  Commonly known as:  LASIX  Take 160 mg by mouth 2 (two) times daily. Takes 4 tablets     metoprolol 50 MG tablet  Commonly known as:  LOPRESSOR  Take 25 mg by mouth daily before breakfast.     OCUVITE PRESERVISION PO  Take 1 tablet by mouth daily.     polyethylene glycol packet  Commonly known as:  MIRALAX / GLYCOLAX  Take 17 g by mouth daily.     pregabalin 25 MG capsule  Commonly known as:  LYRICA  Take 25 mg by mouth at bedtime.     rOPINIRole 1 MG tablet  Commonly known as:  REQUIP  Take 1 mg by mouth at bedtime.     VITAMIN B 12 PO  Take 1 tablet by mouth daily.      warfarin 5 MG tablet  Commonly known as:  COUMADIN  Take 5 mg by mouth daily.       Allergies  Allergen Reactions  . Statins     myalgias  . Aspirin Hives    Patient states that he can take the enteric coated but not the uncoated  . Feraheme [Ferumoxytol] Rash and Other (See Comments)    Abdominal pain  . Penicillins Hives       Follow-up Information   Follow up with BURNETT,BRENT A, MD In 1 week. (hospital follow up)    Specialty:  Family Medicine   Contact information:   [phone]  [address] [place] [address] [phone]        The results of significant diagnostics from this hospitalization (including imaging, microbiology, ancillary and laboratory) are listed below for reference.    Significant Diagnostic Studies: Ct Knee Right Wo Contrast  05/08/2013   CLINICAL DATA:  Right knee swelling and discoloration. Blood thinner use. Knee pain.  EXAM: CT OF THE RIGHT KNEE WITHOUT CONTRAST  TECHNIQUE: Multidetector CT imaging was performed according to the standard protocol. Multiplanar CT image reconstructions were also generated.  COMPARISON:  05/08/2013  FINDINGS: A large field of view was performed as requested, including 19 cm above the knee joint and 19 cm below the knee joint.  On the top most images, we demonstrate the bottom of a suspected hematoma in the vastus lateralis, as shown on image 54 of series 8 and image 1 of series 4, but this is in the upper thigh region and not close to the knee.  There is subcutaneous edema in the upper thigh, around the knee (most confluent anteriorly) and diffusely in the upper calf. The edema along the anterior knee anterior to the patella is high in density and accordingly could certainly represent subcutaneous hematoma. Trace knee joint effusion. No obvious effacement of muscular compartments around the knee.  Bony demineralization noted. No fracture identified. Arterial atherosclerotic calcification is present.  On image 95 of  series 4, there is some focal indistinct high density in the subcutaneous tissues potentially reflecting a second focus of poorly marginated bruising/hematoma.  IMPRESSION: 1. High subcutaneous density anterior to the patella and along the anterolateral and anteromedial knee could represent subcutaneous hematoma. Possible bruising/ small subcutaneous hematoma along the proximal anteromedial calf. There is diffuse edema in the subcutaneous tissues of the lower half of the thigh and upper half of the calf. 2. On the very top most images, about 19 cm proximal to the knee joint, there is an appearance suggesting possible hematoma in the vastus lateralis muscle. Only the bottom most extent of this process was captured on today's exam, despite the use of a very large field of view around the knee.   Electronically Signed   By: Herbie Baltimore M.D.   On: 05/08/2013 15:18   Dg Knee Complete 4 Views Right  05/08/2013   CLINICAL DATA:  Right knee pain, swelling, and bruising.  EXAM: RIGHT KNEE - COMPLETE 4+ VIEW  COMPARISON:  None.  FINDINGS: There is no evidence of fracture, dislocation, or joint effusion. There is no evidence of arthropathy or other focal bone abnormality. Generalized osteopenia noted. Peripheral vascular calcification also demonstrated, with surgical clips in the proximal right leg.  IMPRESSION: No acute findings.   Electronically Signed   By: Myles Rosenthal M.D.   On: 05/08/2013 12:28    Microbiology: Recent Results (from the past 240 hour(s))  SURGICAL PCR SCREEN     Status: None   Collection Time    05/10/13  1:56 AM      Result Value Ref Range Status   MRSA, PCR NEGATIVE  NEGATIVE Final   Staphylococcus aureus NEGATIVE  NEGATIVE Final   Comment:            The Xpert SA Assay (FDA     approved for NASAL specimens     in patients over 34 years of age),     is one component of     a comprehensive surveillance     program.  Test performance has  been validated by [facility] for  patients greater     than or equal to 28 year old.     It is not intended     to diagnose infection nor to     guide or monitor treatment.     Labs: Basic Metabolic Panel:  Recent Labs Lab 05/08/13 1254 05/09/13 0408  NA 141 144  K 4.2 4.2  CL 106 106  CO2  --  23  GLUCOSE 105* 96  BUN 63* 65*  CREATININE 3.40* 3.03*  CALCIUM  --  9.3   Liver Function Tests: No results found for this basename: AST, ALT, ALKPHOS, BILITOT, PROT, ALBUMIN,  in the last 168 hours No results found for this basename: LIPASE, AMYLASE,  in the last 168 hours No results found for this basename: AMMONIA,  in the last 168 hours CBC:  Recent Labs Lab 05/08/13 1240 05/08/13 1254 05/08/13 1905 05/08/13 2317 05/09/13 0408 05/10/13 0530  WBC 6.0  --  6.2 6.0 5.0 5.9  NEUTROABS 4.5  --   --   --   --   --   HGB 7.7* 7.5* 7.6* 7.1* 6.9* 8.7*  HCT 24.0* 22.0* 23.8* 21.6* 21.4* 26.6*  MCV 88.9  --  88.8 88.2 87.7 86.4  PLT 110*  --  122* 99* 105* 119*   Cardiac Enzymes: No results found for this basename: CKTOTAL, CKMB, CKMBINDEX, TROPONINI,  in the last 168 hours BNP: BNP (last 3 results) No results found for this basename: PROBNP,  in the last 8760 hours CBG: No results found for this basename: GLUCAP,  in the last 168 hours     Signed:  Marinda Elk  [facility] 05/10/2013, 10:32 AM

## 2013-05-10 NOTE — Progress Notes (Signed)
ANTICOAGULATION CONSULT NOTE - Follow Up Consult  Pharmacy Consult for Coumadin Indication: atrial fibrillation  Allergies  Allergen Reactions  . Statins     myalgias  . Aspirin Hives    Patient states that he can take the enteric coated but not the uncoated  . Feraheme [Ferumoxytol] Rash and Other (See Comments)    Abdominal pain  . Penicillins Hives  Patient Measurements: Height: 5\' 7"  (170.2 cm) Weight: 172 lb 4.8 oz (78.155 kg) (scale c) IBW/kg (Calculated) : 66.1 Vital Signs: Temp: 98.1 F (36.7 C) (04/21 0627) Temp src: Oral (04/21 0627) BP: 137/65 mmHg (04/21 0627) Pulse Rate: 135 (04/21 0823) Labs:  Recent Labs  05/08/13 1240 05/08/13 1254  05/08/13 2317 05/09/13 0408 05/10/13 0530  HGB 7.7* 7.5*  < > 7.1* 6.9* 8.7*  HCT 24.0* 22.0*  < > 21.6* 21.4* 26.6*  PLT 110*  --   < > 99* 105* 119*  LABPROT 31.5*  --   --   --  30.2* 20.8*  INR 3.19*  --   --   --  3.02* 1.85*  CREATININE  --  3.40*  --   --  3.03*  --   < > = values in this interval not displayed.  Estimated Creatinine Clearance: 17 ml/min (by C-G formula based on Cr of 3.03).  Assessment: [age] YOM admitted with RLE hematoma + acute blood loss anemia on chronic Coumadin therapy. Home regimen was 5mg  daily except 7.5mg  on Mondays (changed 4/13 per clinic notes due to low INR of 1.7).   INR was SUPRA-therapeutic on admission (3.19) >> now down to 1.85 (no reversal agents given).  H/H improved post transfusion on 4/20. Platelets up to 119.  S/p pacemaker change today.   Goal of Therapy:  INR 2-3 Monitor platelets by anticoagulation protocol: Yes   Plan:  1. Restart Coumadin per Dr. Robb Matarrtiz- 5mg  po x1 tonight. Recommend d/c Coumadin 5mg  daily.  2. Daily PT/INR while on therapy.  3. Monitor hematoma  Link SnufferJessica Shaine Mount, PharmD, BCPS Clinical Pharmacist [phone] 05/10/2013,10:21 AM

## 2013-05-10 NOTE — Progress Notes (Signed)
[facility] PROGRESS NOTE Interim History: 80 y.o. male with extensive PMH- stage IV chronic kidney disease, CAD/CABG, HTN, PAF on Coumadin, PCP a week ago-unsure what he was told. His INR on 4/13 was 1.7 and Coumadin dose was increased. Over the last 1 week, he is noted progressively worsening diffuse intermittent pain and diffuse swelling of his right lower extremity. He denies any trauma to that limb or fall. He has intermittent spontaneous bruising of his extremities secondary to Coumadin and notice some around the right knee and thought it was the usual. However since yesterday, bruising around the knee has gotten worse  Assessment/Plan: Hematoma of right lower extremity: - due to increase in coumadin. - Mild improvement in INR. - Hbg < 7.0 transfuse 1 units  Warfarin-induced coagulopathy: - Resume coumadin   Acute post-hemorrhagic anemia - transfuse 1 unit of PRBC Hbg <7.0.  Stage IV chronic kidney disease:  - Creatinine at baseline. Follow up BMP in a.m.   History of PAF/PPM/CAD/CABG: Paced rhythm.  - Continue home medications.  - History of bioprosthetic aortic valve: AC management as above  AVR 23 mm bioprosthesis - on coumadin.  Chronic renal failure, pt has AVF LUE - At baseline.  SLE (systemic lupus erythematosus) - stable.    Code Status: Full  Family Communication: Discussed with spouse and son at bedside  Disposition Plan: Home when medically stable    Consultants:  none  Procedures:  CTknee  Antibiotics:  None  HPI/Subjective: Leg pain improved.  Objective: Filed Vitals:   05/10/13 0345 05/10/13 0518 05/10/13 0627 05/10/13 0823  BP: 126/61 114/64 137/65   Pulse: 70 74 73 135  Temp: 98.6 F (37 C) 98.1 F (36.7 C) 98.1 F (36.7 C)   TempSrc: Oral Oral Oral   Resp: 16 18 18    Height:      Weight:  78.155 kg (172 lb 4.8 oz)    SpO2: 93% 93% 98%     Intake/Output Summary (Last 24 hours) at 05/10/13 1017 Last data filed at  05/10/13 0714  Gross per 24 hour  Intake 1330.08 ml  Output   1900 ml  Net -569.92 ml   Filed Weights   05/08/13 1500 05/09/13 0706 05/10/13 0518  Weight: 77.111 kg (170 lb) 77.3 kg (170 lb 6.7 oz) 78.155 kg (172 lb 4.8 oz)    Exam:  General: Alert, awake, oriented x3, in no acute distress.  HEENT: No bruits, no goiter.  Heart: Regular rate and rhythm, without murmurs, rubs, gallops.  Lungs: Good air movement, clear Abdomen: Soft, nontender, nondistended, positive bowel sounds.    Data Reviewed: Basic Metabolic Panel:  Recent Labs Lab 05/08/13 1254 05/09/13 0408  NA 141 144  K 4.2 4.2  CL 106 106  CO2  --  23  GLUCOSE 105* 96  BUN 63* 65*  CREATININE 3.40* 3.03*  CALCIUM  --  9.3   Liver Function Tests: No results found for this basename: AST, ALT, ALKPHOS, BILITOT, PROT, ALBUMIN,  in the last 168 hours No results found for this basename: LIPASE, AMYLASE,  in the last 168 hours No results found for this basename: AMMONIA,  in the last 168 hours CBC:  Recent Labs Lab 05/08/13 1240 05/08/13 1254 05/08/13 1905 05/08/13 2317 05/09/13 0408 05/10/13 0530  WBC 6.0  --  6.2 6.0 5.0 5.9  NEUTROABS 4.5  --   --   --   --   --   HGB 7.7* 7.5* 7.6* 7.1* 6.9* 8.7*  HCT  24.0* 22.0* 23.8* 21.6* 21.4* 26.6*  MCV 88.9  --  88.8 88.2 87.7 86.4  PLT 110*  --  122* 99* 105* 119*   Cardiac Enzymes: No results found for this basename: CKTOTAL, CKMB, CKMBINDEX, TROPONINI,  in the last 168 hours BNP (last 3 results) No results found for this basename: PROBNP,  in the last 8760 hours CBG: No results found for this basename: GLUCAP,  in the last 168 hours  Recent Results (from the past 240 hour(s))  SURGICAL PCR SCREEN     Status: None   Collection Time    05/10/13  1:56 AM      Result Value Ref Range Status   MRSA, PCR NEGATIVE  NEGATIVE Final   Staphylococcus aureus NEGATIVE  NEGATIVE Final   Comment:            The Xpert SA Assay (FDA     approved for NASAL specimens      in patients over 52 years of age),     is one component of     a comprehensive surveillance     program.  Test performance has     been validated by [employer] for patients greater     than or equal to 3 year old.     It is not intended     to diagnose infection nor to     guide or monitor treatment.     Studies: Ct Knee Right Wo Contrast  05/08/2013   CLINICAL DATA:  Right knee swelling and discoloration. Blood thinner use. Knee pain.  EXAM: CT OF THE RIGHT KNEE WITHOUT CONTRAST  TECHNIQUE: Multidetector CT imaging was performed according to the standard protocol. Multiplanar CT image reconstructions were also generated.  COMPARISON:  05/08/2013  FINDINGS: A large field of view was performed as requested, including 19 cm above the knee joint and 19 cm below the knee joint.  On the top most images, we demonstrate the bottom of a suspected hematoma in the vastus lateralis, as shown on image 54 of series 8 and image 1 of series 4, but this is in the upper thigh region and not close to the knee.  There is subcutaneous edema in the upper thigh, around the knee (most confluent anteriorly) and diffusely in the upper calf. The edema along the anterior knee anterior to the patella is high in density and accordingly could certainly represent subcutaneous hematoma. Trace knee joint effusion. No obvious effacement of muscular compartments around the knee.  Bony demineralization noted. No fracture identified. Arterial atherosclerotic calcification is present.  On image 95 of series 4, there is some focal indistinct high density in the subcutaneous tissues potentially reflecting a second focus of poorly marginated bruising/hematoma.  IMPRESSION: 1. High subcutaneous density anterior to the patella and along the anterolateral and anteromedial knee could represent subcutaneous hematoma. Possible bruising/ small subcutaneous hematoma along the proximal anteromedial calf. There is diffuse edema in the  subcutaneous tissues of the lower half of the thigh and upper half of the calf. 2. On the very top most images, about 19 cm proximal to the knee joint, there is an appearance suggesting possible hematoma in the vastus lateralis muscle. Only the bottom most extent of this process was captured on today's exam, despite the use of a very large field of view around the knee.   Electronically Signed   By: Herbie Baltimore M.D.   On: 05/08/2013 15:18   Dg Knee Complete 4 Views  Right  05/08/2013   CLINICAL DATA:  Right knee pain, swelling, and bruising.  EXAM: RIGHT KNEE - COMPLETE 4+ VIEW  COMPARISON:  None.  FINDINGS: There is no evidence of fracture, dislocation, or joint effusion. There is no evidence of arthropathy or other focal bone abnormality. Generalized osteopenia noted. Peripheral vascular calcification also demonstrated, with surgical clips in the proximal right leg.  IMPRESSION: No acute findings.   Electronically Signed   By: Myles RosenthalJohn  Stahl M.D.   On: 05/08/2013 12:28    Scheduled Meds: . amiodarone  200 mg Oral Daily  . amLODipine  10 mg Oral Daily  . aspirin EC  81 mg Oral Daily  . calcitRIOL  0.25 mcg Oral QAC breakfast  . cycloSPORINE  1 drop Both Eyes BID  . ferrous sulfate  325 mg Oral Q breakfast  . finasteride  5 mg Oral Daily  . furosemide  160 mg Oral BID  . metoprolol  25 mg Oral QAC breakfast  . omega-3 acid ethyl esters  2 g Oral Daily  . polyethylene glycol  17 g Oral Daily  . pregabalin  25 mg Oral QHS  . rOPINIRole  1 mg Oral QHS  . sodium chloride  3 mL Intravenous Q12H  . vancomycin  1,000 mg Intravenous Q12H  . Warfarin - Pharmacist Dosing Inpatient   Does not apply q1800   Continuous Infusions: . sodium chloride       Marinda ElkAbraham Feliz Ortiz  [facility] Pager [phone]. If 8PM-8AM, please contact night-coverage at [email or web address], password [email or web address] 05/10/2013, 10:17 AM  LOS: 2 days

## 2013-05-10 NOTE — Progress Notes (Signed)
Packed Red Blood Cells picked up from blood bank, verified by a second nurse, consent signed,and blood administered to patient. Patient states he feels okay when asked during and after the of blood infusing. Will continue to monitor patient and vitals every hour until blood infusion is completed.

## 2013-05-10 NOTE — H&P (View-Only) (Signed)
Patient ID: Nathaniel Steele, male   DOB: 1927-01-15, 80 y.o.   MRN: [id number]     Reason for office visit CAD status post CABG, aortic valve replacement, paroxysmal atrial fibrillation, pacemaker  [name] is 80 years old and underwent aortic valve replacement with a biological prosthesis (23 mm) and coronary artery bypass surgery in 2013. He has recurrent paroxysmal atrial tachycardia and atrial fibrillation for which he takes warfarin anticoagulation. A pacemaker was implanted in 2006 (Medtronic enRhythm) for sinus node dysfunction and is still functioning normally although it is approaching ERI. He is not truly pacemaker dependent but the underlying rhythm is severe sinus bradycardia at about 30 beats per minute.  He has severe chronic kidney disease and is approaching end-stage renal disease. He has a working AV fistula but has not required hemodialysis. His kidney failure is suspected to be secondary to lupus nephritis but lupus has been in remission for several years. He has severe lumbar spine disease and has previously undergone surgery on couple of occasions. He has also had to stop his warfarin intermittently for spinal injections.   At his last several pacemaker checks atrial for relation was found to be very infrequent and most of the record episodes were atrial tachycardia. Amiodarone was discontinued in November, as it appeared that the arrhythmia was primarily a perioperative phenomenon. I was also concerned about the fact that the frequency of ventricular pacing had gradually increased and was essentially 100%.   Interrogation of his pacemaker today shows that there is a marked increase in frequency and duration of episodes of atrial fibrillation starting in late January. The current atrial fibrillation burden is 13.5% up from essentially 0. Episodes of atrial fibrillation of up to 3 hours in duration have been recorded as recently as yesterday. This correlates well with the expected  gradual resolution of amiodarone effect. Despite this there has been no reduction in the frequency of ventricular pacing: it remains 100%.   He remains asymptomatic and is oblivious to the arrhythmia. He is appropriately anticoagulated with warfarin. There have been no high ventricular rates.    Allergies  Allergen Reactions  . Statins     myalgias  . Aspirin Hives    Patient states that he can take the enteric coated but not the uncoated  . Feraheme [Ferumoxytol] Rash and Other (See Comments)    Abdominal pain  . Penicillins Hives    Current Outpatient Prescriptions  Medication Sig Dispense Refill  . amLODipine (NORVASC) 10 MG tablet Take 10 mg by mouth daily.      Marland Kitchen aspirin EC 81 MG tablet Take 81 mg by mouth daily.      . calcitRIOL (ROCALTROL) 0.25 MCG capsule Take 0.25 mcg by mouth daily before breakfast.       . Cyanocobalamin (VITAMIN B 12 PO) Take 1 tablet by mouth daily.      . ferrous sulfate 325 (65 FE) MG tablet Take 325 mg by mouth daily with breakfast.      . finasteride (PROSCAR) 5 MG tablet Take 5 mg by mouth daily.      . fish oil-omega-3 fatty acids 1000 MG capsule Take 2 g by mouth daily.      . furosemide (LASIX) 40 MG tablet Take 120 mg by mouth 2 (two) times daily. Takes 3 tablets      . metoprolol (LOPRESSOR) 50 MG tablet Take 25 mg by mouth daily before breakfast.       . Multiple Vitamins-Minerals (OCUVITE PRESERVISION PO)  Take 1 tablet by mouth daily.      . polyethylene glycol (MIRALAX / GLYCOLAX) packet Take 17 g by mouth daily.      Marland Kitchen. rOPINIRole (REQUIP) 1 MG tablet Take 1 tablet (1 mg total) by mouth at bedtime.  30 tablet  6  . warfarin (COUMADIN) 5 MG tablet Take 1 tablet by mouth daily or as directed  90 tablet  1  . amiodarone (PACERONE) 200 MG tablet Take 1 tablet (200 mg total) by mouth daily.  30 tablet  6  . [DISCONTINUED] polysaccharide iron (NIFEREX) 150 MG CAPS capsule Take 1 capsule (150 mg total) by mouth daily. For one month then stop.  30  each  0   No current facility-administered medications for this visit.    Past Medical History  Diagnosis Date  . Shortness of breath with exertion  . Chronic kidney disease     not on dialysis yet  . Coronary artery disease 05/14/2010    stress test - no scintigraphic evidence of inducible myocardial ischemia,; normal study  . Hypertension   . Arthritis   . Blood dyscrasia     one time had low platlet count  . Dysrhythmia     atrial fib  . SSS (sick sinus syndrome) 08/27/2011    echo EF >55%, severe LAE, moderate RAE, tissue AVR w/ gradients 35 and 17mmHg  . Pericardial effusion 03/12/2011    echo EF 55-60%  . Claudication 02/19/2012    LE doppler no evidence of arterial insufficiency, lower extremities demonstrate normal values  w/ no evidence of insufficiency  . Lupus nephritis   . Pacemaker 09/19/2004    implanted  . Restless leg syndrome   . Cellulitis   . Anemia   . Benign prostatic hypertrophy   . Constipation   . Shoulder joint pain     Past Surgical History  Procedure Laterality Date  . Back surgery    . Appendectomy    . Hernia repair    . Pacemaker insertion  2006    Medtronic EnRhythm  . Av fistula placement  02/24/2011  . Aortic valve replacement  02/24/2011    pericardial tissue valve [facility]  . Coronary artery bypass graft  02/24/2011    LIMA to LAD, SVG to distal RCA  . Cardiac catheterization  02/13/2011    mod/severe aortic valve stenosis w peak to peak gradient 25-32 mmHg,  70% stenosis LAD, 30-40%proximal followed by 90% sstenosis in RCA prior to anterior RV margin branch  . Lumbar laminectomy/decompression microdiscectomy Right 04/16/2012    Procedure: DECOMPRESSIVE L4 - L5/ MICRODISCECTOMY ON THE RIGHT 1 LEVEL;  Surgeon: Jacki Conesonald A Gioffre, MD;  Location: [facility];  Service: Orthopedics;  Laterality: Right;    Family History  Problem Relation Age of Onset  . Hypertension Mother   . Kidney disease Brother     History   Social History  .  Marital Status: Married    Spouse Name: [name]    Number of Children: 1  . Years of Education: 6   Occupational History  . Not on file.   Social History Main Topics  . Smoking status: Former Smoker    Types: Cigarettes    Quit date: 02/11/1957  . Smokeless tobacco: Never Used  . Alcohol Use: No  . Drug Use: No  . Sexual Activity: Not on file   Other Topics Concern  . Not on file   Social History Narrative   Patient is married ([name]).  Patient has one child.   Patient does not drink caffeine.   Patient is right-handed.   Patient is retired.   Patient has a 6th grade education.          Review of systems: The patient specifically denies any chest pain at rest or with exertion, dyspnea at rest or with exertion, orthopnea, paroxysmal nocturnal dyspnea, syncope, palpitations, focal neurological deficits, intermittent claudication, lower extremity edema, unexplained weight gain, cough, hemoptysis or wheezing.  The patient also denies abdominal pain, nausea, vomiting, dysphagia, diarrhea, constipation, polyuria, polydipsia, dysuria, hematuria, frequency, urgency, abnormal bleeding or bruising, fever, chills, unexpected weight changes, mood swings, change in skin or hair texture, change in voice quality, auditory or visual problems, allergic reactions or rashes, new musculoskeletal complaints other than usual "aches and pains".    PHYSICAL EXAM BP 118/60  Pulse 80  Resp 16  Ht 5' 7.5" (1.715 m)  Wt 77.066 kg (169 lb 14.4 oz)  BMI 26.20 kg/m2 General: Alert, oriented x3, no distress  Head: no evidence of trauma, PERRL, EOMI, no exophtalmos or lid lag, no myxedema, no xanthelasma; normal ears, nose and oropharynx  Neck: normal jugular venous pulsations and no hepatojugular reflux; brisk carotid pulses without delay and faint bilateral carotid bruits  Chest: clear to auscultation, no signs of consolidation by percussion or palpation, normal fremitus, symmetrical and full respiratory  excursions; healed sternotomy, healthy right subclavian pacemaker site  Cardiovascular: normal position and quality of the apical impulse, regular rhythm, normal first and paradoxically split second heart sounds, no rubs or gallops, early peaking grade 2/6 systolic ejection murmur, no diastolic murmur  Abdomen: no tenderness or distention, no masses by palpation, no abnormal pulsatility or arterial bruits, normal bowel sounds, no hepatosplenomegaly  Extremities: Large tortuous left forearm AV fistula with excellent thrill and bruit  no clubbing, cyanosis or edema; 2+ radial, ulnar and brachial pulses bilaterally; 2+ right femoral, posterior tibial and dorsalis pedis pulses; 2+ left femoral, posterior tibial and dorsalis pedis pulses; no subclavian or femoral bruits  Neurological: grossly nonfocal except mild resting tremor   EKG: AV sequential paced   Lipid Panel     Component Value Date/Time   CHOL 190 07/08/2012 0955   TRIG 164* 07/08/2012 0955   HDL 28* 07/08/2012 0955   CHOLHDL 6.8 07/08/2012 0955   VLDL 33 07/08/2012 0955   LDLCALC 129* 07/08/2012 0955    BMET    Component Value Date/Time   NA 143 07/08/2012 0955   K 4.1 07/08/2012 0955   CL 105 07/08/2012 0955   CO2 25 07/08/2012 0955   GLUCOSE 89 07/08/2012 0955   BUN 49* 07/08/2012 0955   CREATININE 3.26* 07/08/2012 0955   CREATININE 2.67* 04/17/2012 0527   CALCIUM 9.5 07/08/2012 0955   GFRNONAA 21* 04/17/2012 0527   GFRAA 24* 04/17/2012 0527     ASSESSMENT AND PLAN  Mr. Karnes has a normally functioning permanent pacemaker that is very close to elective replacement indicator. It is anticipated that he will require pacemaker change out for the summer. On the other hand this particular moderate pacemaker is known to be rather unpredictable as far as battery voltage decay.  Since there has been a remarkable increase in the burden of atrial fibrillation I have recommended that he resume amiodarone.  He does not have any symptoms to  suggest aortic stenosis or coronary insufficiency.  I am still not sure why he is not taking a statin. I have been unable to retrieve his old charts. I  can tell that he was on simvastatin in 2006 and I am not sure when this was discontinued. There is no mention of statin intolerance in his chart. He had bypass surgery. The most recent lipid profile in 2014 shows an LDL cholesterol level of 129. Dr. Kandis CockingWeintraub's notes state that he had a LDL cholesterol of 91 in 2012 seems that he was not receiving a statin at that time. I think just repeat a lipid profile prior to his next appointment and discuss whether or not he should be back on statin therapy.  Orders Placed This Encounter  Procedures  . EKG 12-Lead   Meds ordered this encounter  Medications  . amiodarone (PACERONE) 200 MG tablet    Sig: Take 1 tablet (200 mg total) by mouth daily.    Dispense:  30 tablet    Refill:  6    Byrne Capek  Thurmon FairMihai Kirin Pastorino, MD, [facility] [phone] office [phone] pager

## 2013-05-10 NOTE — Evaluation (Signed)
Physical Therapy Evaluation and Discharge Patient Details Name: Nathaniel Steele MRN: [id number] DOB: 12/17/1926 Today's Date: 05/10/2013   History of Present Illness  Pt is an 80 y/o male. Over the last 1 week, he noted progressively worsening diffuse intermittent pain and diffuse swelling of his right LE. He denies any trauma to that limb or fall. He has intermittent spontaneous bruising of his extremities secondary to Coumadin and notice some around the right knee and thought it was the usual. However, bruising around the knee has gotten worse. He also has worsened pain on weightbearing or flexing his knee. He presented to the ED where CT of the right knee without contrast shows extensive hemorrhage of right lower extremity. Pt also s/p dual chamber pacemaker generator changeout.   Clinical Impression  Patient evaluated by Physical Therapy with no further acute PT needs identified. All education has been completed and the patient has no further questions. See below for any follow-up Physial Therapy or equipment needs. PT is signing off. Thank you for this referral.     Follow Up Recommendations No PT follow up    Equipment Recommendations  None recommended by PT    Recommendations for Other Services       Precautions / Restrictions Precautions Precautions: Fall;ICD/Pacemaker Restrictions Weight Bearing Restrictions: No      Mobility  Bed Mobility Overal bed mobility: Modified Independent             General bed mobility comments: No physical assist required to transition to EOB.   Transfers Overall transfer level: Modified independent Equipment used: Rolling walker (2 wheeled)             General transfer comment: Pt able to power-up to full standing position with and without the RW. Pt shows proper hand placement and safety awareness.  Ambulation/Gait Ambulation/Gait assistance: Supervision Ambulation Distance (Feet): 150 Feet Assistive device: Rolling walker (2  wheeled) Gait Pattern/deviations: WFL(Within Functional Limits) Gait velocity: WFL Gait velocity interpretation: at or above normal speed for age/gender General Gait Details: Pt required no physical assist during gait training and demonstrated good balance and safety awareness with the RW. States there is slight pain in RLE during ambulation, however he feels this is improving, and pt does not demonstrate antalgic gait pattern on that R side.   Stairs            Wheelchair Mobility    Modified Rankin (Stroke Patients Only)       Balance Overall balance assessment: No apparent balance deficits (not formally assessed)                                           Pertinent Vitals/Pain Pt reports no SOB or dizziness throughout session.     Home Living Family/patient expects to be discharged to:: Private residence Living Arrangements: Spouse/significant other Available Help at Discharge: Family;Available 24 hours/day Type of Home: House Home Access: Level entry     Home Layout: Two level Home Equipment: Cane - single point;Walker - 2 wheels;Tub bench Additional Comments: Walks down to basement to use walk-in shower as he cannot step into the tub shower on the main level. Has a tub bench but does not like using it.     Prior Function Level of Independence: Needs assistance      ADL's / Homemaking Assistance Needed: Occasionally needs assist with dressing activity  Hand Dominance   Dominant Hand: Right    Extremity/Trunk Assessment   Upper Extremity Assessment: Defer to OT evaluation           Lower Extremity Assessment: RLE deficits/detail RLE Deficits / Details: Edema and bruising noted on RLE, with decreased AROM due to reported pain.    Cervical / Trunk Assessment: Kyphotic  Communication   Communication: No difficulties  Cognition Arousal/Alertness: Awake/alert Behavior During Therapy: WFL for tasks assessed/performed Overall  Cognitive Status: Within Functional Limits for tasks assessed                      General Comments      Exercises        Assessment/Plan    PT Assessment Patent does not need any further PT services  PT Diagnosis     PT Problem List    PT Treatment Interventions     PT Goals (Current goals can be found in the Care Plan section) Acute Rehab PT Goals PT Goal Formulation: No goals set, d/c therapy    Frequency     Barriers to discharge        Co-evaluation               End of Session   Activity Tolerance: Patient tolerated treatment well Patient left: in chair;with call bell/phone within reach;with family/visitor present Nurse Communication: Mobility status         Time: 1350-1407 PT Time Calculation (min): 17 min   Charges:   PT Evaluation $Initial PT Evaluation Tier I: 1 Procedure PT Treatments $Therapeutic Activity: 8-22 mins   PT G CodesRuthann Cancer:          Sondra Blixt [place] 05/10/2013, 2:33 PM  Ruthann CancerLaura [place], PT, DPT Acute Rehabilitation Services Pager: [phone]

## 2013-05-11 LAB — TYPE AND SCREEN
ABO/RH(D): A POS
Antibody Screen: NEGATIVE
UNIT DIVISION: 0
Unit division: 0

## 2013-05-13 ENCOUNTER — Telehealth: Payer: Self-pay | Admitting: Pharmacist Clinician (PhC)/ Clinical Pharmacy Specialist

## 2013-05-13 ENCOUNTER — Telehealth: Payer: Self-pay | Admitting: Cardiovascular Disease

## 2013-05-13 NOTE — Telephone Encounter (Signed)
Wants to know when is he suppose to come back in to have the clamps removed .Nathaniel Steele. Please call

## 2013-05-13 NOTE — Telephone Encounter (Signed)
Returned call and pt verified x 2.  Pt stated he is supposed to get his blood checked w/ the Coumadin.  Discharge note states to check in 1 week.  Pt also in need of pacer site check and put wife on phone to set up appt.  Appts scheduled for INR check at 9:20am and Pacer site check at 9:30am w/ Robbie LisBrittainy Simmons, PA-C.  After hours call instructions given as well.  Wife verbalized understanding and agreed w/ plan.

## 2013-05-13 NOTE — Telephone Encounter (Signed)
Hospital d/c on 4/22, needs to have INR check.    Pt already scheduled for INR and PA visit Wednesday 4/29  Advised wife to keep appt

## 2013-05-18 ENCOUNTER — Ambulatory Visit (INDEPENDENT_AMBULATORY_CARE_PROVIDER_SITE_OTHER): Payer: Medicare Other | Admitting: Cardiology

## 2013-05-18 ENCOUNTER — Encounter: Payer: Self-pay | Admitting: Cardiology

## 2013-05-18 ENCOUNTER — Ambulatory Visit (INDEPENDENT_AMBULATORY_CARE_PROVIDER_SITE_OTHER): Payer: Medicare Other | Admitting: Pharmacist Clinician (PhC)/ Clinical Pharmacy Specialist

## 2013-05-18 ENCOUNTER — Ambulatory Visit: Payer: Medicare Other | Admitting: Pharmacist Clinician (PhC)/ Clinical Pharmacy Specialist

## 2013-05-18 VITALS — BP 106/63 | HR 93 | Ht 67.0 in | Wt 175.0 lb

## 2013-05-18 DIAGNOSIS — I480 Paroxysmal atrial fibrillation: Secondary | ICD-10-CM

## 2013-05-18 DIAGNOSIS — I4891 Unspecified atrial fibrillation: Secondary | ICD-10-CM

## 2013-05-18 DIAGNOSIS — Z950 Presence of cardiac pacemaker: Secondary | ICD-10-CM

## 2013-05-18 DIAGNOSIS — Z7901 Long term (current) use of anticoagulants: Secondary | ICD-10-CM

## 2013-05-18 LAB — POCT INR: INR: 2.1

## 2013-05-18 NOTE — Patient Instructions (Signed)
Your physician recommends that you schedule a follow-up appointment on  Friday 05/20/2013 for recheck of pacemaker site Dr. Royann Shiversroitoru to step in to see pt on that visit

## 2013-05-18 NOTE — Progress Notes (Signed)
Patient ID: Nathaniel Steele, male   DOB: 08/01/1926, 80 y.o.   MRN: [id number]    05/18/2013 Nathaniel Guysaul C Prom   02/08/1926  [id number]  Primary Physicia Delorse LekBURNETT,BRENT A, MD Primary Cardiologist: Dr. Royann Shiversroitoru  HPI:  Mr. Nathaniel Steele is a 80 y/o male who presented to clinic today for wound check/ staple removal, following a recent PPM generator change, as his prior generator had reached ERI. On inspection of his wound, the pocket was noted to protrude slightly beyond what would be considered normal. The wound however appeared well healed and the surrounding skin was intact. There was mild errythema. No drainage. Slight tenderness. He denies fever and chills. Dr. Royann Shiversroitoru was consulted via phone and he suggested performing an INR check today and delay staple removal until 05/20/13.   INR is 2.1.    Current Outpatient Prescriptions  Medication Sig Dispense Refill  . amiodarone (PACERONE) 200 MG tablet Take 200 mg by mouth daily.      Marland Kitchen. amLODipine (NORVASC) 10 MG tablet Take 10 mg by mouth daily.      Marland Kitchen. aspirin EC 81 MG tablet Take 81 mg by mouth daily.      . calcitRIOL (ROCALTROL) 0.25 MCG capsule Take 0.25 mcg by mouth daily before breakfast.       . Cyanocobalamin (VITAMIN B 12 PO) Take 1 tablet by mouth daily.      . cycloSPORINE (RESTASIS) 0.05 % ophthalmic emulsion 1 drop 2 (two) times daily.      . ferrous sulfate 325 (65 FE) MG tablet Take 325 mg by mouth daily with breakfast.      . finasteride (PROSCAR) 5 MG tablet Take 5 mg by mouth daily.      . fish oil-omega-3 fatty acids 1000 MG capsule Take 2 g by mouth daily.      . furosemide (LASIX) 40 MG tablet Take 160 mg by mouth 2 (two) times daily. Takes 4 tablets      . metoprolol (LOPRESSOR) 50 MG tablet Take 25 mg by mouth daily before breakfast.       . Multiple Vitamins-Minerals (OCUVITE PRESERVISION PO) Take 1 tablet by mouth daily.      . polyethylene glycol (MIRALAX / GLYCOLAX) packet Take 17 g by mouth daily.      . pregabalin (LYRICA) 25  MG capsule Take 25 mg by mouth at bedtime.      Marland Kitchen. rOPINIRole (REQUIP) 1 MG tablet Take 1 mg by mouth at bedtime.      Marland Kitchen. warfarin (COUMADIN) 5 MG tablet Take 5 mg by mouth daily.      . [DISCONTINUED] polysaccharide iron (NIFEREX) 150 MG CAPS capsule Take 1 capsule (150 mg total) by mouth daily. For one month then stop.  30 each  0   No current facility-administered medications for this visit.    Allergies  Allergen Reactions  . Statins     myalgias  . Aspirin Hives    Patient states that he can take the enteric coated but not the uncoated  . Feraheme [Ferumoxytol] Rash and Other (See Comments)    Abdominal pain  . Penicillins Hives    History   Social History  . Marital Status: Married    Spouse Name: [name]    Number of Children: 1  . Years of Education: 6   Occupational History  . Not on file.   Social History Main Topics  . Smoking status: Former Smoker    Types: Cigarettes    Quit date: 02/11/1957  .  Smokeless tobacco: Never Used  . Alcohol Use: No  . Drug Use: No  . Sexual Activity: Not on file   Other Topics Concern  . Not on file   Social History Narrative   Patient is married ([name]).   Patient has one child.   Patient does not drink caffeine.   Patient is right-handed.   Patient is retired.   Patient has a 6th grade education.           Review of Systems: General: negative for chills, fever, night sweats or weight changes.  Cardiovascular: negative for chest pain, dyspnea on exertion, edema, orthopnea, palpitations, paroxysmal nocturnal dyspnea or shortness of breath Dermatological: negative for rash Respiratory: negative for cough or wheezing Urologic: negative for hematuria Abdominal: negative for nausea, vomiting, diarrhea, bright red blood per rectum, melena, or hematemesis Neurologic: negative for visual changes, syncope, or dizziness All other systems reviewed and are otherwise negative except as noted above.    Blood pressure 106/63, pulse  93, height 5\' 7"  (1.702 m), weight 175 lb (79.379 kg).  General appearance: alert, cooperative and no distress Neck: no carotid bruit and no JVD Lungs: clear to auscultation bilaterally Extremities: bilateral LEE R>L Pulses: 2+ and symmetric Skin: warm and dry; skin overlying PPM generator is intact and well healed. Mild erythema. No drainage. Neurologic: Grossly normal   ASSESSMENT AND PLAN:   Wound Check: Discussed with Dr. Royann Shiversroitoru via phone. Will delay staple removal until Friday 05/20/13. He will be seen by Dr. Royann Shiversroitoru. INR was checked today in clinic and was 2.1.   PLAN  Return in 2 days for appointment with Dr. Royann Shiversroitoru.   Note: This was a No Charge Visit   Brittainy SimmonsPA-C 05/18/2013 12:33 PM

## 2013-05-20 ENCOUNTER — Ambulatory Visit (INDEPENDENT_AMBULATORY_CARE_PROVIDER_SITE_OTHER): Payer: Medicare Other | Admitting: Cardiology

## 2013-05-20 ENCOUNTER — Encounter: Payer: Self-pay | Admitting: Cardiology

## 2013-05-20 VITALS — BP 146/72 | HR 72 | Ht 67.5 in | Wt 176.9 lb

## 2013-05-20 DIAGNOSIS — Z950 Presence of cardiac pacemaker: Secondary | ICD-10-CM

## 2013-05-20 NOTE — Patient Instructions (Signed)
Your physician recommends that you schedule a follow-up appointment in:  3 months with Dr. Royann Shiverscroitoru

## 2013-05-20 NOTE — Progress Notes (Signed)
Patient ID: Nathaniel Steele, male   DOB: 04/08/1926, 80 y.o.   MRN: [id number] Patient ID: Nathaniel Steele, male   DOB: 10/05/1926, 80 y.o.   MRN: [id number]    05/20/2013 Nathaniel Steele   09/11/1926  [id number]  Primary Physicia Delorse LekBURNETT,BRENT A, MD Primary Cardiologist: Dr. Royann Shiversroitoru  HPI:  Nathaniel Steele is a 80 y/o male who presented to clinic today for wound check/ staple removal, following a recent PPM generator change, as his prior generator had reached ERI. The wound appeares well healed and the surrounding skin is intact. There was mild errythema. No drainage. Slight tenderness. He denies fever and chills.    Current Outpatient Prescriptions  Medication Sig Dispense Refill  . amiodarone (PACERONE) 200 MG tablet Take 200 mg by mouth daily.      Marland Kitchen. amLODipine (NORVASC) 10 MG tablet Take 10 mg by mouth daily.      Marland Kitchen. aspirin EC 81 MG tablet Take 81 mg by mouth daily.      . calcitRIOL (ROCALTROL) 0.25 MCG capsule Take 0.25 mcg by mouth daily before breakfast.       . Cyanocobalamin (VITAMIN B 12 PO) Take 1 tablet by mouth daily.      . cycloSPORINE (RESTASIS) 0.05 % ophthalmic emulsion 1 drop 2 (two) times daily.      . ferrous sulfate 325 (65 FE) MG tablet Take 325 mg by mouth daily with breakfast.      . finasteride (PROSCAR) 5 MG tablet Take 5 mg by mouth daily.      . fish oil-omega-3 fatty acids 1000 MG capsule Take 2 g by mouth daily.      . furosemide (LASIX) 40 MG tablet Take 160 mg by mouth 2 (two) times daily. Takes 4 tablets      . metoprolol (LOPRESSOR) 50 MG tablet Take 25 mg by mouth daily before breakfast.       . Multiple Vitamins-Minerals (OCUVITE PRESERVISION PO) Take 1 tablet by mouth daily.      . polyethylene glycol (MIRALAX / GLYCOLAX) packet Take 17 g by mouth daily.      . pregabalin (LYRICA) 25 MG capsule Take 25 mg by mouth at bedtime.      Marland Kitchen. rOPINIRole (REQUIP) 1 MG tablet Take 1 mg by mouth at bedtime.      Marland Kitchen. warfarin (COUMADIN) 5 MG tablet Take 5 mg by mouth daily.       . [DISCONTINUED] polysaccharide iron (NIFEREX) 150 MG CAPS capsule Take 1 capsule (150 mg total) by mouth daily. For one month then stop.  30 each  0   No current facility-administered medications for this visit.    Allergies  Allergen Reactions  . Statins     myalgias  . Aspirin Hives    Patient states that he can take the enteric coated but not the uncoated  . Feraheme [Ferumoxytol] Rash and Other (See Comments)    Abdominal pain  . Penicillins Hives    History   Social History  . Marital Status: Married    Spouse Name: [name]    Number of Children: 1  . Years of Education: 6   Occupational History  . Not on file.   Social History Main Topics  . Smoking status: Former Smoker    Types: Cigarettes    Quit date: 02/11/1957  . Smokeless tobacco: Never Used  . Alcohol Use: No  . Drug Use: No  . Sexual Activity: Not on file   Other Topics Concern  .  Not on file   Social History Narrative   Patient is married ([name]).   Patient has one child.   Patient does not drink caffeine.   Patient is right-handed.   Patient is retired.   Patient has a 6th grade education.           Review of Systems: General: negative for chills, fever, night sweats or weight changes.  Cardiovascular: negative for chest pain, dyspnea on exertion, edema, orthopnea, palpitations, paroxysmal nocturnal dyspnea or shortness of breath Dermatological: negative for rash Respiratory: negative for cough or wheezing Urologic: negative for hematuria Abdominal: negative for nausea, vomiting, diarrhea, bright red blood per rectum, melena, or hematemesis Neurologic: negative for visual changes, syncope, or dizziness All other systems reviewed and are otherwise negative except as noted above.    Blood pressure 146/72, pulse 72, height 5' 7.5" (1.715 m), weight 176 lb 14.4 oz (80.241 kg).  General appearance: alert, cooperative and no distress Neck: no carotid bruit and no JVD Lungs: clear to  auscultation bilaterally Extremities: bilateral LEE R>L Pulses: 2+ and symmetric Skin: warm and dry; skin overlying PPM generator is intact and well healed. Mild erythema. No drainage. Neurologic: Grossly normal   ASSESSMENT AND PLAN:   Wound Check: Well healed. Staples were removed by Dr. Royann Shiversroitoru. Betadine was applied to the area. He will need to refrain from standing water including bath tubs, hot tubs and swimming pools for another 4 weeks. F/U with Dr. Royann Shiversroitoru in 3 months.    PLAN  F/u with Dr. Royann Shiversroitoru in 3 months.   Note: This was a No Charge Visit   Adianna Darwin SimmonsPA-C 05/20/2013 3:52 PM

## 2013-05-23 ENCOUNTER — Ambulatory Visit: Payer: Medicare Other | Admitting: Pharmacist Clinician (PhC)/ Clinical Pharmacy Specialist

## 2013-05-24 ENCOUNTER — Ambulatory Visit (HOSPITAL_COMMUNITY): Admission: RE | Admit: 2013-05-24 | Payer: Medicare Other | Source: Ambulatory Visit | Admitting: Cardiovascular Disease

## 2013-05-24 ENCOUNTER — Encounter (HOSPITAL_COMMUNITY): Admission: RE | Payer: Self-pay | Source: Ambulatory Visit

## 2013-05-24 SURGERY — PACEMAKER GENERATOR CHANGE
Anesthesia: LOCAL

## 2013-06-08 ENCOUNTER — Ambulatory Visit (INDEPENDENT_AMBULATORY_CARE_PROVIDER_SITE_OTHER): Payer: Medicare Other | Admitting: Pharmacist Clinician (PhC)/ Clinical Pharmacy Specialist

## 2013-06-08 DIAGNOSIS — Z7901 Long term (current) use of anticoagulants: Secondary | ICD-10-CM

## 2013-06-08 DIAGNOSIS — I480 Paroxysmal atrial fibrillation: Secondary | ICD-10-CM

## 2013-06-08 DIAGNOSIS — I4891 Unspecified atrial fibrillation: Secondary | ICD-10-CM

## 2013-06-08 LAB — POCT INR: INR: 2.7

## 2013-06-15 ENCOUNTER — Encounter: Payer: Self-pay | Admitting: Cardiovascular Disease

## 2013-06-20 ENCOUNTER — Encounter (HOSPITAL_COMMUNITY): Payer: Self-pay | Admitting: Emergency Medicine

## 2013-06-20 ENCOUNTER — Inpatient Hospital Stay (HOSPITAL_COMMUNITY)
Admission: EM | Admit: 2013-06-20 | Discharge: 2013-06-23 | DRG: 378 | Disposition: A | Discharge status: Home / Self Care | Payer: Medicare Other | Attending: Internal Medicine | Admitting: Internal Medicine

## 2013-06-20 ENCOUNTER — Ambulatory Visit: Payer: Medicare Other | Admitting: Physical Therapy

## 2013-06-20 DIAGNOSIS — T45515A Adverse effect of anticoagulants, initial encounter: Secondary | ICD-10-CM | POA: Diagnosis present

## 2013-06-20 DIAGNOSIS — D62 Acute posthemorrhagic anemia: Secondary | ICD-10-CM

## 2013-06-20 DIAGNOSIS — I4821 Permanent atrial fibrillation: Secondary | ICD-10-CM | POA: Diagnosis present

## 2013-06-20 DIAGNOSIS — Z720 Tobacco use: Secondary | ICD-10-CM

## 2013-06-20 DIAGNOSIS — R0600 Dyspnea, unspecified: Secondary | ICD-10-CM

## 2013-06-20 DIAGNOSIS — R195 Other fecal abnormalities: Secondary | ICD-10-CM

## 2013-06-20 DIAGNOSIS — N059 Unspecified nephritic syndrome with unspecified morphologic changes: Secondary | ICD-10-CM | POA: Diagnosis present

## 2013-06-20 DIAGNOSIS — I2510 Atherosclerotic heart disease of native coronary artery without angina pectoris: Secondary | ICD-10-CM | POA: Diagnosis present

## 2013-06-20 DIAGNOSIS — M329 Systemic lupus erythematosus, unspecified: Secondary | ICD-10-CM

## 2013-06-20 DIAGNOSIS — G2581 Restless legs syndrome: Secondary | ICD-10-CM

## 2013-06-20 DIAGNOSIS — Z952 Presence of prosthetic heart valve: Secondary | ICD-10-CM

## 2013-06-20 DIAGNOSIS — S8010XA Contusion of unspecified lower leg, initial encounter: Secondary | ICD-10-CM

## 2013-06-20 DIAGNOSIS — I495 Sick sinus syndrome: Secondary | ICD-10-CM

## 2013-06-20 DIAGNOSIS — N186 End stage renal disease: Secondary | ICD-10-CM | POA: Diagnosis present

## 2013-06-20 DIAGNOSIS — Z45010 Encounter for checking and testing of cardiac pacemaker pulse generator [battery]: Secondary | ICD-10-CM

## 2013-06-20 DIAGNOSIS — G609 Hereditary and idiopathic neuropathy, unspecified: Secondary | ICD-10-CM

## 2013-06-20 DIAGNOSIS — K922 Gastrointestinal hemorrhage, unspecified: Secondary | ICD-10-CM

## 2013-06-20 DIAGNOSIS — M48062 Spinal stenosis, lumbar region with neurogenic claudication: Secondary | ICD-10-CM

## 2013-06-20 DIAGNOSIS — Z7901 Long term (current) use of anticoagulants: Secondary | ICD-10-CM

## 2013-06-20 DIAGNOSIS — D6832 Hemorrhagic disorder due to extrinsic circulating anticoagulants: Secondary | ICD-10-CM

## 2013-06-20 DIAGNOSIS — Z87891 Personal history of nicotine dependence: Secondary | ICD-10-CM

## 2013-06-20 DIAGNOSIS — K2940 Chronic atrophic gastritis without bleeding: Secondary | ICD-10-CM | POA: Diagnosis present

## 2013-06-20 DIAGNOSIS — I4891 Unspecified atrial fibrillation: Secondary | ICD-10-CM | POA: Diagnosis present

## 2013-06-20 DIAGNOSIS — Z950 Presence of cardiac pacemaker: Secondary | ICD-10-CM

## 2013-06-20 DIAGNOSIS — J9690 Respiratory failure, unspecified, unspecified whether with hypoxia or hypercapnia: Secondary | ICD-10-CM

## 2013-06-20 DIAGNOSIS — Z954 Presence of other heart-valve replacement: Secondary | ICD-10-CM

## 2013-06-20 DIAGNOSIS — R791 Abnormal coagulation profile: Secondary | ICD-10-CM

## 2013-06-20 DIAGNOSIS — Z951 Presence of aortocoronary bypass graft: Secondary | ICD-10-CM

## 2013-06-20 DIAGNOSIS — R1013 Epigastric pain: Secondary | ICD-10-CM

## 2013-06-20 DIAGNOSIS — N189 Chronic kidney disease, unspecified: Secondary | ICD-10-CM

## 2013-06-20 DIAGNOSIS — S8011XA Contusion of right lower leg, initial encounter: Secondary | ICD-10-CM

## 2013-06-20 DIAGNOSIS — D649 Anemia, unspecified: Secondary | ICD-10-CM

## 2013-06-20 DIAGNOSIS — Z79899 Other long term (current) drug therapy: Secondary | ICD-10-CM

## 2013-06-20 DIAGNOSIS — R001 Bradycardia, unspecified: Secondary | ICD-10-CM

## 2013-06-20 DIAGNOSIS — I480 Paroxysmal atrial fibrillation: Secondary | ICD-10-CM

## 2013-06-20 DIAGNOSIS — Z7982 Long term (current) use of aspirin: Secondary | ICD-10-CM

## 2013-06-20 DIAGNOSIS — K921 Melena: Principal | ICD-10-CM | POA: Diagnosis present

## 2013-06-20 DIAGNOSIS — N184 Chronic kidney disease, stage 4 (severe): Secondary | ICD-10-CM | POA: Diagnosis present

## 2013-06-20 NOTE — ED Notes (Addendum)
Patient with hypotension and low hgb per [facility].  Patient is CAOx3.  Feeling fatigued for last few days.  Patient also states he has been very short of breath.

## 2013-06-21 ENCOUNTER — Emergency Department (HOSPITAL_COMMUNITY): Payer: Medicare Other

## 2013-06-21 ENCOUNTER — Encounter (HOSPITAL_COMMUNITY): Payer: Self-pay | Admitting: Gastroenterology

## 2013-06-21 ENCOUNTER — Encounter (HOSPITAL_COMMUNITY)
Admission: EM | Disposition: A | Discharge status: Home / Self Care | Payer: Self-pay | Source: Home / Self Care | Attending: Internal Medicine

## 2013-06-21 DIAGNOSIS — R195 Other fecal abnormalities: Secondary | ICD-10-CM | POA: Diagnosis present

## 2013-06-21 DIAGNOSIS — K922 Gastrointestinal hemorrhage, unspecified: Secondary | ICD-10-CM | POA: Diagnosis present

## 2013-06-21 DIAGNOSIS — Z7901 Long term (current) use of anticoagulants: Secondary | ICD-10-CM

## 2013-06-21 DIAGNOSIS — R791 Abnormal coagulation profile: Secondary | ICD-10-CM

## 2013-06-21 DIAGNOSIS — N189 Chronic kidney disease, unspecified: Secondary | ICD-10-CM

## 2013-06-21 DIAGNOSIS — Z954 Presence of other heart-valve replacement: Secondary | ICD-10-CM

## 2013-06-21 DIAGNOSIS — R0609 Other forms of dyspnea: Secondary | ICD-10-CM

## 2013-06-21 DIAGNOSIS — R0989 Other specified symptoms and signs involving the circulatory and respiratory systems: Secondary | ICD-10-CM

## 2013-06-21 DIAGNOSIS — D62 Acute posthemorrhagic anemia: Secondary | ICD-10-CM

## 2013-06-21 DIAGNOSIS — R1013 Epigastric pain: Secondary | ICD-10-CM

## 2013-06-21 DIAGNOSIS — D649 Anemia, unspecified: Secondary | ICD-10-CM | POA: Diagnosis present

## 2013-06-21 HISTORY — PX: ESOPHAGOGASTRODUODENOSCOPY: SHX5428

## 2013-06-21 HISTORY — PX: GIVENS CAPSULE STUDY: SHX5432

## 2013-06-21 LAB — BASIC METABOLIC PANEL
BUN: 74 mg/dL — ABNORMAL HIGH (ref 6–23)
BUN: 71 mg/dL — ABNORMAL HIGH (ref 6–23)
CALCIUM: 9.2 mg/dL (ref 8.4–10.5)
CALCIUM: 9.7 mg/dL (ref 8.4–10.5)
CO2: 25 mEq/L (ref 19–32)
CO2: 24 mEq/L (ref 19–32)
Chloride: 103 mEq/L (ref 96–112)
Chloride: 106 mEq/L (ref 96–112)
Creatinine, Ser: 2.83 mg/dL — ABNORMAL HIGH (ref 0.50–1.35)
Creatinine, Ser: 2.66 mg/dL — ABNORMAL HIGH (ref 0.50–1.35)
GFR calc Af Amer: 22 mL/min — ABNORMAL LOW (ref >90)
GFR calc Af Amer: 24 mL/min — ABNORMAL LOW (ref >90)
GFR, EST NON AFRICAN AMERICAN: 19 mL/min — AB (ref >90)
GFR, EST NON AFRICAN AMERICAN: 20 mL/min — AB (ref >90)
GLUCOSE: 88 mg/dL (ref 70–99)
Glucose, Bld: 101 mg/dL — ABNORMAL HIGH (ref 70–99)
POTASSIUM: 3.7 meq/L (ref 3.7–5.3)
Potassium: 3.7 mEq/L (ref 3.7–5.3)
Sodium: 140 mEq/L (ref 137–147)
Sodium: 142 mEq/L (ref 137–147)

## 2013-06-21 LAB — CBC
HCT: 24.4 % — ABNORMAL LOW (ref 39.0–52.0)
Hemoglobin: 7.7 g/dL — ABNORMAL LOW (ref 13.0–17.0)
MCH: 28.7 pg (ref 26.0–34.0)
MCHC: 31.6 g/dL (ref 30.0–36.0)
MCV: 91 fL (ref 78.0–100.0)
PLATELETS: 107 10*3/uL — AB (ref 150–400)
RBC: 2.68 MIL/uL — ABNORMAL LOW (ref 4.22–5.81)
RDW: 18.7 % — ABNORMAL HIGH (ref 11.5–15.5)
WBC: 3.4 10*3/uL — ABNORMAL LOW (ref 4.0–10.5)

## 2013-06-21 LAB — CBC WITH DIFFERENTIAL/PLATELET
BASOS ABS: 0 10*3/uL (ref 0.0–0.1)
Basophils Relative: 1 % (ref 0–1)
EOS PCT: 5 % (ref 0–5)
Eosinophils Absolute: 0.2 10*3/uL (ref 0.0–0.7)
HCT: 20.5 % — ABNORMAL LOW (ref 39.0–52.0)
HEMOGLOBIN: 6.5 g/dL — AB (ref 13.0–17.0)
LYMPHS PCT: 12 % (ref 12–46)
Lymphs Abs: 0.4 10*3/uL — ABNORMAL LOW (ref 0.7–4.0)
MCH: 29 pg (ref 26.0–34.0)
MCHC: 31.7 g/dL (ref 30.0–36.0)
MCV: 91.5 fL (ref 78.0–100.0)
Monocytes Absolute: 0.7 10*3/uL (ref 0.1–1.0)
Monocytes Relative: 19 % — ABNORMAL HIGH (ref 3–12)
NEUTROS ABS: 2.2 10*3/uL (ref 1.7–7.7)
Neutrophils Relative %: 63 % (ref 43–77)
Platelets: 125 10*3/uL — ABNORMAL LOW (ref 150–400)
RBC: 2.24 MIL/uL — AB (ref 4.22–5.81)
RDW: 18.6 % — AB (ref 11.5–15.5)
WBC: 3.5 10*3/uL — AB (ref 4.0–10.5)

## 2013-06-21 LAB — TROPONIN I

## 2013-06-21 LAB — PROTIME-INR
INR: 3.92 — ABNORMAL HIGH (ref 0.00–1.49)
INR: 4.26 — AB (ref 0.00–1.49)
PROTHROMBIN TIME: 39.3 s — AB (ref 11.6–15.2)
Prothrombin Time: 36.9 seconds — ABNORMAL HIGH (ref 11.6–15.2)

## 2013-06-21 LAB — SAMPLE TO BLOOD BANK

## 2013-06-21 LAB — PREPARE RBC (CROSSMATCH)

## 2013-06-21 LAB — OCCULT BLOOD X 1 CARD TO LAB, STOOL: Fecal Occult Bld: POSITIVE — AB

## 2013-06-21 LAB — HEMOGLOBIN AND HEMATOCRIT, BLOOD
HEMATOCRIT: 24 % — AB (ref 39.0–52.0)
HEMOGLOBIN: 7.7 g/dL — AB (ref 13.0–17.0)

## 2013-06-21 SURGERY — EGD (ESOPHAGOGASTRODUODENOSCOPY)
Anesthesia: Moderate Sedation

## 2013-06-21 SURGERY — IMAGING PROCEDURE, GI TRACT, INTRALUMINAL, VIA CAPSULE
Anesthesia: LOCAL

## 2013-06-21 MED ORDER — OXYCODONE HCL 5 MG PO TABS
5.0000 mg | ORAL_TABLET | ORAL | Status: DC | PRN
  Administered 2013-06-21 (×2): 5 mg via ORAL
  Filled 2013-06-21 (×2): qty 1

## 2013-06-21 MED ORDER — METOPROLOL TARTRATE 50 MG PO TABS
50.0000 mg | ORAL_TABLET | Freq: Every day | ORAL | Status: DC
  Administered 2013-06-21 – 2013-06-23 (×3): 50 mg via ORAL
  Filled 2013-06-21 (×5): qty 1

## 2013-06-21 MED ORDER — ALUM & MAG HYDROXIDE-SIMETH 200-200-20 MG/5ML PO SUSP: 30.0000 mL | Freq: Four times a day (QID) | ORAL | Status: DC | PRN

## 2013-06-21 MED ORDER — PREGABALIN 50 MG PO CAPS
50.0000 mg | ORAL_CAPSULE | Freq: Every day | ORAL | Status: DC
  Administered 2013-06-21 – 2013-06-22 (×2): 50 mg via ORAL
  Filled 2013-06-21 (×2): qty 1

## 2013-06-21 MED ORDER — SODIUM CHLORIDE 0.9 % IV SOLN
INTRAVENOUS | Status: DC
Start: 2013-06-21 — End: 2013-06-21

## 2013-06-21 MED ORDER — ACETAMINOPHEN 650 MG RE SUPP: 650.0000 mg | Freq: Four times a day (QID) | RECTAL | Status: DC | PRN

## 2013-06-21 MED ORDER — SODIUM CHLORIDE 0.9 % IV SOLN
INTRAVENOUS | Status: DC
  Administered 2013-06-21 – 2013-06-22 (×2): via INTRAVENOUS

## 2013-06-21 MED ORDER — ONDANSETRON HCL 4 MG/2ML IJ SOLN: 4.0000 mg | Freq: Four times a day (QID) | INTRAMUSCULAR | Status: DC | PRN

## 2013-06-21 MED ORDER — FENTANYL CITRATE 0.05 MG/ML IJ SOLN
INTRAMUSCULAR | Status: AC
  Filled 2013-06-21: qty 2

## 2013-06-21 MED ORDER — FENTANYL CITRATE 0.05 MG/ML IJ SOLN
INTRAMUSCULAR | Status: DC | PRN
  Administered 2013-06-21: 25 ug via INTRAVENOUS

## 2013-06-21 MED ORDER — PANTOPRAZOLE SODIUM 40 MG IV SOLR
40.0000 mg | Freq: Two times a day (BID) | INTRAVENOUS | Status: DC
  Administered 2013-06-21 – 2013-06-22 (×5): 40 mg via INTRAVENOUS
  Filled 2013-06-21 (×7): qty 40

## 2013-06-21 MED ORDER — SODIUM CHLORIDE 0.9 % IJ SOLN
3.0000 mL | Freq: Two times a day (BID) | INTRAMUSCULAR | Status: DC
  Administered 2013-06-21 – 2013-06-23 (×5): 3 mL via INTRAVENOUS

## 2013-06-21 MED ORDER — AMIODARONE HCL 200 MG PO TABS
200.0000 mg | ORAL_TABLET | Freq: Every day | ORAL | Status: DC
  Administered 2013-06-21 – 2013-06-23 (×3): 200 mg via ORAL
  Filled 2013-06-21 (×3): qty 1

## 2013-06-21 MED ORDER — ONDANSETRON HCL 4 MG PO TABS: 4.0000 mg | ORAL_TABLET | Freq: Four times a day (QID) | ORAL | Status: DC | PRN

## 2013-06-21 MED ORDER — PHYTONADIONE 5 MG PO TABS
10.0000 mg | ORAL_TABLET | Freq: Once | ORAL | Status: AC
  Administered 2013-06-21: 10 mg via ORAL
  Filled 2013-06-21: qty 2

## 2013-06-21 MED ORDER — ACETAMINOPHEN 325 MG PO TABS: 650.0000 mg | ORAL_TABLET | Freq: Four times a day (QID) | ORAL | Status: DC | PRN

## 2013-06-21 MED ORDER — MORPHINE SULFATE 2 MG/ML IJ SOLN
2.0000 mg | INTRAMUSCULAR | Status: DC | PRN
  Administered 2013-06-21 – 2013-06-22 (×3): 2 mg via INTRAVENOUS
  Filled 2013-06-21 (×3): qty 1

## 2013-06-21 MED ORDER — MIDAZOLAM HCL 10 MG/2ML IJ SOLN
INTRAMUSCULAR | Status: DC | PRN
  Administered 2013-06-21: 1 mg via INTRAVENOUS
  Administered 2013-06-21: 2 mg via INTRAVENOUS

## 2013-06-21 MED ORDER — SODIUM CHLORIDE 0.9 % IV SOLN: INTRAVENOUS | Status: DC

## 2013-06-21 MED ORDER — CYCLOSPORINE 0.05 % OP EMUL
1.0000 [drp] | Freq: Two times a day (BID) | OPHTHALMIC | Status: DC
  Administered 2013-06-21 – 2013-06-23 (×5): 1 [drp] via OPHTHALMIC
  Filled 2013-06-21 (×7): qty 1

## 2013-06-21 MED ORDER — ROPINIROLE HCL 1 MG PO TABS
2.0000 mg | ORAL_TABLET | Freq: Every day | ORAL | Status: DC
  Administered 2013-06-21 – 2013-06-22 (×2): 2 mg via ORAL
  Filled 2013-06-21 (×3): qty 2

## 2013-06-21 MED ORDER — MIDAZOLAM HCL 5 MG/ML IJ SOLN
INTRAMUSCULAR | Status: AC
  Filled 2013-06-21: qty 2

## 2013-06-21 SURGICAL SUPPLY — 1 items: TOWEL COTTON PACK 4EA (MISCELLANEOUS) ×4 IMPLANT

## 2013-06-21 NOTE — Progress Notes (Signed)
Patient admitted early this AM by Dr. Vanessa Barbara- please see H&P.  No further bleeding.  Received 2 units of PRBC Dr. Elnoria Howard consulted Marlin Canary

## 2013-06-21 NOTE — ED Notes (Addendum)
Hemoglobin 6.5 called from the lab. Report to Dr. Norlene Campbell.

## 2013-06-21 NOTE — Progress Notes (Signed)
Utilization review completed.  

## 2013-06-21 NOTE — ED Notes (Signed)
Dr. Norlene Campbell ordered 2 units of blood, informed Rhunette Croft receiving nurse that 3 more units are available.

## 2013-06-21 NOTE — Op Note (Signed)
[facility] [address] [place], [address]   OPERATIVE PROCEDURE REPORT  PATIENT: Nathaniel Steele, Nathaniel Steele  MR#: [id number] BIRTHDATE: 1926/03/08  GENDER: Male ENDOSCOPIST: Jeani Hawking, MD ASSISTANT:   Karie Soda, technician and Jeneen Montgomery, RN PROCEDURE DATE: 06/21/2013 PROCEDURE:   EGD, diagnostic ASA CLASS:   Class III INDICATIONS:Anemia and Heme positive stool. MEDICATIONS: Versed 2 mg IV and Fentanyl 25 mcg IV TOPICAL ANESTHETIC:   none  DESCRIPTION OF PROCEDURE:   After the risks benefits and alternatives of the procedure were thoroughly explained, informed consent was obtained.  The Pentax Gastroscope [id number]  endoscope was introduced through the mouth  and advanced to the second portion of the duodenum Without limitations.      The instrument was slowly withdrawn as the mucosa was fully examined.   FINDINGS: The distal esophagus was torturous, but no evidence of any inflammation or ulcerations.  The gastric lumen exhibited an atrophic gastritis and there was a patch of erythema.  No evidence of any bleeding.  An antral image was not possible because of the angulation in this area.  The duodenum was negative for any evidence of ulcerations, erosions, polyps, masses, or vascular abnormalities.   Retroflexed views revealed no abnormalities. The scope was then withdrawn from the patient and the procedure terminated.  COMPLICATIONS: There were no complications.  IMPRESSION: 1) Focal gastritis.  Biopsies were not obtained as his INR was elevated. 2) Atrophic gastritis.  RECOMMENDATIONS: 1) Capsule endoscopy.  _______________________________ eSignedJeani Hawking, MD 06/21/2013 2:55 PM

## 2013-06-21 NOTE — ED Provider Notes (Signed)
CSN: [id number]     Arrival date & time 06/20/13  2331 History   First MD Initiated Contact with Patient 06/20/13 2359     Chief Complaint  Patient presents with  . Abnormal Lab     (Consider location/radiation/quality/duration/timing/severity/associated sxs/prior Treatment) HPI -year-old male presents emergency apartment from home called in by his primary care doctor's office who reported a low hemoglobin of 6.  Patient reports he was seen today at his primary care doctor's office due to 2-3 days of worsening fatigue, shortness of breath, weakness and dizziness.  He also reports that he has had nausea.  No fevers or chills no abdominal pain no loss of appetite no blood in urine or stool.  Patient reports he is not eating well for the last 2-3 days due to the nausea, was able to eat a hot dog today.  Patient is on Coumadin.  He reports his last INR was 2.6.  Patient recently admitted in April for hematoma in right leg, low hemoglobin receive transfusion at that time.  Patient has history of chronic shortness of breath, chronic kidney disease not yet on dialysis, coronary disease, sick sinus syndrome status post pacemaker, aortic valve replacement Past Medical History  Diagnosis Date  . Shortness of breath with exertion  . Chronic kidney disease     not on dialysis yet  . Coronary artery disease 05/14/2010    stress test - no scintigraphic evidence of inducible myocardial ischemia,; normal study  . Hypertension   . Arthritis   . Blood dyscrasia     one time had low platlet count  . Dysrhythmia     atrial fib  . SSS (sick sinus syndrome) 08/27/2011    echo EF >55%, severe LAE, moderate RAE, tissue AVR w/ gradients 35 and 17mmHg  . Pericardial effusion 03/12/2011    echo EF 55-60%  . Claudication 02/19/2012    LE doppler no evidence of arterial insufficiency, lower extremities demonstrate normal values  w/ no evidence of insufficiency  . Lupus nephritis   . Pacemaker 09/19/2004    implanted   . Restless leg syndrome   . Cellulitis   . Anemia   . Benign prostatic hypertrophy   . Constipation   . Shoulder joint pain    Past Surgical History  Procedure Laterality Date  . Back surgery    . Appendectomy    . Hernia repair    . Pacemaker insertion  2006    Medtronic EnRhythm  . Av fistula placement  02/24/2011  . Aortic valve replacement  02/24/2011    pericardial tissue valve [facility]a Ease  . Coronary artery bypass graft  02/24/2011    LIMA to LAD, SVG to distal RCA  . Cardiac catheterization  02/13/2011    mod/severe aortic valve stenosis w peak to peak gradient 25-32 mmHg,  70% stenosis LAD, 30-40%proximal followed by 90% sstenosis in RCA prior to anterior RV margin branch  . Lumbar laminectomy/decompression microdiscectomy Right 04/16/2012    Procedure: DECOMPRESSIVE L4 - L5/ MICRODISCECTOMY ON THE RIGHT 1 LEVEL;  Surgeon: Jacki Conesonald A Gioffre, MD;  Location: [facility] ORS;  Service: Orthopedics;  Laterality: Right;   Family History  Problem Relation Age of Onset  . Hypertension Mother   . Kidney disease Brother    History  Substance Use Topics  . Smoking status: Former Smoker    Types: Cigarettes    Quit date: 02/11/1957  . Smokeless tobacco: Never Used  . Alcohol Use: No    Review of  Systems  See History of Present Illness; otherwise all other systems are reviewed and negative   Allergies  Statins; Aspirin; Feraheme; and Penicillins  Home Medications   Prior to Admission medications   Medication Sig Start Date End Date Taking? Authorizing Provider  amiodarone (PACERONE) 200 MG tablet Take 200 mg by mouth daily. 04/18/13  Yes Mihai Croitoru, MD  amLODipine (NORVASC) 10 MG tablet Take 10 mg by mouth daily.   Yes Historical Provider, MD  aspirin EC 81 MG tablet Take 81 mg by mouth daily.   Yes Historical Provider, MD  calcitRIOL (ROCALTROL) 0.25 MCG capsule Take 0.25 mcg by mouth daily before breakfast.    Yes Historical Provider, MD  Cyanocobalamin (VITAMIN B 12 PO)  Take 1 tablet by mouth daily.   Yes Historical Provider, MD  cycloSPORINE (RESTASIS) 0.05 % ophthalmic emulsion Place 1 drop into both eyes 2 (two) times daily.    Yes Historical Provider, MD  ferrous sulfate 325 (65 FE) MG tablet Take 325 mg by mouth daily with breakfast.   Yes Historical Provider, MD  finasteride (PROSCAR) 5 MG tablet Take 5 mg by mouth daily.   Yes Historical Provider, MD  fish oil-omega-3 fatty acids 1000 MG capsule Take 1 g by mouth daily.    Yes Historical Provider, MD  furosemide (LASIX) 40 MG tablet Take 160 mg by mouth 2 (two) times daily. Takes 4 tablets   Yes Historical Provider, MD  metoprolol (LOPRESSOR) 50 MG tablet Take 50 mg by mouth daily before breakfast.    Yes Historical Provider, MD  Multiple Vitamins-Minerals (OCUVITE PRESERVISION PO) Take 1 tablet by mouth daily.   Yes Historical Provider, MD  polyethylene glycol (MIRALAX / GLYCOLAX) packet Take 17 g by mouth daily as needed for mild constipation.    Yes Historical Provider, MD  pregabalin (LYRICA) 25 MG capsule Take 50 mg by mouth at bedtime.    Yes Historical Provider, MD  rOPINIRole (REQUIP) 1 MG tablet Take 2 mg by mouth at bedtime.  03/07/13  Yes Omelia Blackwater, DO  warfarin (COUMADIN) 5 MG tablet Take 5 mg by mouth daily.   Yes Historical Provider, MD   BP 120/76  Pulse 78  Temp(Src) 98.1 F (36.7 C) (Oral)  Resp 18  SpO2 91% Physical Exam  Nursing note and vitals reviewed. Constitutional: He is oriented to person, place, and time. He appears well-developed and well-nourished.  Frail elderly male chronically ill appearing  HENT:  Head: Normocephalic and atraumatic.  Nose: Nose normal.  Mouth/Throat: Oropharynx is clear and moist.  Eyes: Conjunctivae and EOM are normal. Pupils are equal, round, and reactive to light.  Neck: Normal range of motion. Neck supple. No JVD present. No tracheal deviation present. No thyromegaly present.  Cardiovascular: Normal rate, regular rhythm, normal heart  sounds and intact distal pulses.  Exam reveals no gallop and no friction rub.   No murmur heard. Pulmonary/Chest: Effort normal and breath sounds normal. No stridor. No respiratory distress. He has no wheezes. He has no rales. He exhibits no tenderness.  Abdominal: Soft. Bowel sounds are normal. He exhibits no distension and no mass. There is no tenderness. There is no rebound and no guarding.  Musculoskeletal: Normal range of motion. He exhibits no edema and no tenderness.  Lymphadenopathy:    He has no cervical adenopathy.  Neurological: He is alert and oriented to person, place, and time. He exhibits normal muscle tone. Coordination normal.  Skin: Skin is warm and dry. No rash noted. No  erythema. No pallor.  Psychiatric: He has a normal mood and affect. His behavior is normal. Judgment and thought content normal.    ED Course  Procedures (including critical care time) Labs Review Labs Reviewed  CBC WITH DIFFERENTIAL - Abnormal; Notable for the following:    WBC 3.5 (*)    RBC 2.24 (*)    Hemoglobin 6.5 (*)    HCT 20.5 (*)    RDW 18.6 (*)    Platelets 125 (*)    Monocytes Relative 19 (*)    Lymphs Abs 0.4 (*)    All other components within normal limits  BASIC METABOLIC PANEL - Abnormal; Notable for the following:    Glucose, Bld 101 (*)    BUN 74 (*)    Creatinine, Ser 2.83 (*)    GFR calc non Af Amer 19 (*)    GFR calc Af Amer 22 (*)    All other components within normal limits  PROTIME-INR - Abnormal; Notable for the following:    Prothrombin Time 39.3 (*)    INR 4.26 (*)    All other components within normal limits  TROPONIN I  OCCULT BLOOD X 1 CARD TO LAB, STOOL  SAMPLE TO BLOOD BANK  PREPARE RBC (CROSSMATCH)  TYPE AND SCREEN    Imaging Review No results found.   EKG Interpretation   Date/Time:  Tuesday June 21 2013 00:49:31 EDT Ventricular Rate:  71 PR Interval:  41 QRS Duration: 198 QT Interval:  509 QTC Calculation: 553 R Axis:   -70 Text  Interpretation:  Ventricular-paced rhythm No further analysis  attempted due to paced rhythm No significant change since last tracing  Confirmed by Ryana Montecalvo  MD, Vandana Haman ([id number]) on 06/21/2013 1:23:20 AM      MDM   Final diagnoses:  Symptomatic anemia  Elevated INR    80 year old male with to 3 days of worsening shortness of breath, fatigue, dizziness and reported low hemoglobin on Coumadin.  No known source of blood loss noted.  Plan for labs, expected admission with transfusion.  Patient does appear pale and is chronically ill.    Olivia Mackie, MD 06/21/13 [id number]

## 2013-06-21 NOTE — ED Notes (Signed)
Called blood bank to see if blood is ready.  Almost completed.

## 2013-06-21 NOTE — H&P (Signed)
[facility] History and Physical  Nathaniel Guysaul C Griffitts ZOX:[id number] DOB: 07/15/1926 DOA: 06/20/2013  Referring physician:  PCP: Delorse LekBURNETT,BRENT A, MD   Chief Complaint: Generalized weakness  HPI: Nathaniel Steele is a 80 y.o. male with a past medical history of coronary artery disease status post coronary artery bypass grafting, aortic valve replacement with biological prosthesis, paroxysmal atrial fibrillation who is on anticoagulation with warfarin, recently admitted to the medicine service on 05/08/2013, discharged on 05/10/2013 at which time he was treated for hematoma of right lower extremity associated with anemia requiring transfusion with 2 units of packed red blood cells. He presented on 05/08/2013 with an INR 3.19. On today's visit he complains of a three-day history of generalized weakness, fatigue, poor tolerance to physical exertion, shortness of breath, dizziness, has family members report that he has spent most of the day sitting on a recliner. He also complains of epigastric pain characterized as sharp, stabbing, not associated with by mouth intake. He reports dark stools however attributes this to iron pills. Labs in the emergency room revealed a supratherapeutic INR of  4.26 with CBC showing a hemoglobin of 6.5. He was discharged on 05/10/2013 with a hemoglobin of 8.7. In the ER he was found to be guaiac positive. Patient was typed and crossed with first unit of packed red blood cells transfused in the emergency department. He denies history of GI bleed, reports having his last colonoscopy about 5 years ago performed by Dr. Elnoria HowardHung of gastroenterology.                                                                                                                                                                  Review of Systems:  Constitutional:  No weight loss, night sweats, Fevers, chills, positive for generalized weakness, fatigue.  HEENT:  No headaches, Difficulty  swallowing,Tooth/dental problems,Sore throat,  No sneezing, itching, ear ache, nasal congestion, post nasal drip,  Cardio-vascular:  No chest pain, Orthopnea, PND, swelling in lower extremities, anasarca, dizziness, palpitations  GI:  No heartburn, indigestion, positive for epigastric abdominal pain, denies nausea, vomiting, diarrhea, change in bowel habits, loss of appetite, positive for dark stool Resp:  Positive for shortness of breath with exertion or at rest. No excess mucus, no productive cough, No non-productive cough, No coughing up of blood.No change in color of mucus.No wheezing.No chest wall deformity  Skin:  no rash or lesions.  GU:  no dysuria, change in color of urine, no urgency or frequency. No flank pain.  Musculoskeletal:  No joint pain or swelling. No decreased range of motion. No back pain.  Psych:  No change in mood or affect. No depression or anxiety. No memory loss.   Past Medical History  Diagnosis Date  . Shortness of breath with  exertion  . Chronic kidney disease     not on dialysis yet  . Coronary artery disease 05/14/2010    stress test - no scintigraphic evidence of inducible myocardial ischemia,; normal study  . Hypertension   . Arthritis   . Blood dyscrasia     one time had low platlet count  . Dysrhythmia     atrial fib  . SSS (sick sinus syndrome) 08/27/2011    echo EF >55%, severe LAE, moderate RAE, tissue AVR w/ gradients 35 and  . Pericardial effusion 03/12/2011    echo EF 55-60%  . Claudication 02/19/2012    LE doppler no evidence of arterial insufficiency, lower extremities demonstrate normal values  w/ no evidence of insufficiency  . Lupus nephritis   . Pacemaker 09/19/2004    implanted  . Restless leg syndrome   . Cellulitis   . Anemia   . Benign prostatic hypertrophy   . Constipation   . Shoulder joint pain    Past Surgical History  Procedure Laterality Date  . Back surgery    . Appendectomy    . Hernia repair    .  Pacemaker insertion  2006    Medtronic EnRhythm  . Av fistula placement  02/24/2011  . Aortic valve replacement  02/24/2011    pericardial tissue valve [facility] Ease  . Coronary artery bypass graft  02/24/2011    LIMA to LAD, SVG to distal RCA  . Cardiac catheterization  02/13/2011    mod/severe aortic valve stenosis w peak to peak gradient 25-32 mmHg,  70% stenosis LAD, 30-40%proximal followed by 90% sstenosis in RCA prior to anterior RV margin branch  . Lumbar laminectomy/decompression microdiscectomy Right 04/16/2012    Procedure: DECOMPRESSIVE L4 - L5/ MICRODISCECTOMY ON THE RIGHT 1 LEVEL;  Surgeon: Jacki Cones, MD;  Location: [facility];  Service: Orthopedics;  Laterality: Right;   Social History:  reports that he quit smoking about 56 years ago. His smoking use included Cigarettes. He smoked 0.00 packs per day. He has never used smokeless tobacco. He reports that he does not drink alcohol or use illicit drugs.  Allergies  Allergen Reactions  . Statins     myalgias  . Aspirin Hives    Patient states that he can take the enteric coated but not the uncoated  . Feraheme [Ferumoxytol] Rash and Other (See Comments)    Abdominal pain  . Penicillins Hives    Family History  Problem Relation Age of Onset  . Hypertension Mother   . Kidney disease Brother      Prior to Admission medications   Medication Sig Start Date End Date Taking? Authorizing Provider  amiodarone (PACERONE) 200 MG tablet Take 200 mg by mouth daily. 04/18/13  Yes Mihai Croitoru, MD  amLODipine (NORVASC) 10 MG tablet Take 10 mg by mouth daily.   Yes Historical Provider, MD  aspirin EC 81 MG tablet Take 81 mg by mouth daily.   Yes Historical Provider, MD  calcitRIOL (ROCALTROL) 0.25 MCG capsule Take 0.25 mcg by mouth daily before breakfast.    Yes Historical Provider, MD  Cyanocobalamin (VITAMIN B 12 PO) Take 1 tablet by mouth daily.   Yes Historical Provider, MD  cycloSPORINE (RESTASIS) 0.05 % ophthalmic emulsion  Place 1 drop into both eyes 2 (two) times daily.    Yes Historical Provider, MD  ferrous sulfate 325 (65 FE) MG tablet Take 325 mg by mouth daily with breakfast.   Yes Historical Provider, MD  finasteride (PROSCAR)  5 MG tablet Take 5 mg by mouth daily.   Yes Historical Provider, MD  fish oil-omega-3 fatty acids 1000 MG capsule Take 1 g by mouth daily.    Yes Historical Provider, MD  furosemide (LASIX) 40 MG tablet Take 160 mg by mouth 2 (two) times daily. Takes 4 tablets   Yes Historical Provider, MD  metoprolol (LOPRESSOR) 50 MG tablet Take 50 mg by mouth daily before breakfast.    Yes Historical Provider, MD  Multiple Vitamins-Minerals (OCUVITE PRESERVISION PO) Take 1 tablet by mouth daily.   Yes Historical Provider, MD  polyethylene glycol (MIRALAX / GLYCOLAX) packet Take 17 g by mouth daily as needed for mild constipation.    Yes Historical Provider, MD  pregabalin (LYRICA) 25 MG capsule Take 50 mg by mouth at bedtime.    Yes Historical Provider, MD  rOPINIRole (REQUIP) 1 MG tablet Take 2 mg by mouth at bedtime.  03/07/13  Yes Omelia Blackwater, DO  warfarin (COUMADIN) 5 MG tablet Take 5 mg by mouth daily.   Yes Historical Provider, MD   Physical Exam: Filed Vitals:   06/21/13 0230  BP: 116/63  Pulse: 75  Temp:   Resp: 14    BP 116/63  Pulse 75  Temp(Src) 97.8 F (36.6 C) (Oral)  Resp 14  SpO2 94%  General:  Ill-appearing although he is in no acute distress, awake alert cooperative Eyes: PERRL, normal lids, irises & conjunctiva ENT: grossly normal hearing, lips & tongue, dry oral mucosa Neck: no LAD, masses or thyromegaly Cardiovascular: RRR, no m/r/g. Has 1+ bilateral extremity pitting edema Telemetry: SR, no arrhythmias  Respiratory: CTA bilaterally, no w/r/r. Normal respiratory effort. Abdomen: soft, mild tenderness to palpation over epigastric region Skin: no rash or induration seen on limited exam Musculoskeletal: grossly normal tone has 1+ bilateral extremity pitting  edema Psychiatric: grossly normal mood and affect, speech fluent and appropriate Neurologic: grossly non-focal.          Labs on Admission:  Basic Metabolic Panel:  Recent Labs Lab 06/21/13 0034  NA 140  K 3.7  CL 103  CO2 25  GLUCOSE 101*  BUN 74*  CREATININE 2.83*  CALCIUM 9.7   Liver Function Tests: No results found for this basename: AST, ALT, ALKPHOS, BILITOT, PROT, ALBUMIN,  in the last 168 hours No results found for this basename: LIPASE, AMYLASE,  in the last 168 hours No results found for this basename: AMMONIA,  in the last 168 hours CBC:  Recent Labs Lab 06/21/13 0034  WBC 3.5*  NEUTROABS 2.2  HGB 6.5*  HCT 20.5*  MCV 91.5  PLT 125*   Cardiac Enzymes:  Recent Labs Lab 06/21/13 0035  TROPONINI <0.30    BNP (last 3 results) No results found for this basename: PROBNP,  in the last 8760 hours CBG: No results found for this basename: GLUCAP,  in the last 168 hours  Radiological Exams on Admission: Dg Chest Port 1 View  06/21/2013   CLINICAL DATA:  Shortness of breath.  Anemia.  EXAM: PORTABLE CHEST - 1 VIEW  COMPARISON:  04/30/2011  FINDINGS: Chronic cardiopericardial enlargement. Aortic valve replacement and CABG. Unchanged orientation of right approach dual-chamber pacer leads.  Chronic reticulation of lung markings. No consolidation, edema, effusion, or pneumothorax.  IMPRESSION: No active disease.   Electronically Signed   By: Tiburcio Pea M.D.   On: 06/21/2013 01:52    EKG: Independently reviewed. Ventricular pacing  Assessment/Plan Principal Problem:   Acute post-hemorrhagic anemia Active Problems:  Guaiac positive stools   Warfarin-induced coagulopathy   Symptomatic anemia   Chronic renal failure, pt has AVF LUE   PAF (paroxysmal atrial fibrillation), on Amiodarone prior to admission   Chronic anticoagulation, on Coumadin prior to admission   GI bleed   1. Suspected GI bleed. Patient presenting with symptomatic anemia, initial lab  work showing a hemoglobin of 6.5 with hematocrit of 20.5, INR supratherapeutic at 4.26. He reports dark stools which he attributes to iron therapy however was found to be guaiac positive. Will discontinue anticoagulation as well as aspirin therapy for now, administered 10 mg of vitamin K. He has been typed and crossed and will receive 2 units of packed red blood cells. Start Protonix 40 mg IV twice a day. Patient denies history of GI bleed, asthma he underwent colonoscopy about 5 years ago, procedure performed by Dr.Hung. Consult GI in a.m. 2. Acute blood loss anemia. Suspect secondary to GI bleed in setting of supratherapeutic INR. Patient has been typed and crossed, 2 units of packed red blood cells have been ordered,  followup post transfusion H&H check. Will administer 10 mg of vitamin K, stopping anticoagulation, GI consultation in a.m. 3. History of paroxysmal atrial fibrillation. EKG on admission showing ventricular pacing with rates in the 70s. Will continue amiodarone 200 mg by mouth daily and metoprolol 50 mg by mouth daily 4. Stage IV chronic kidney disease, status post AV fistula. Creatinine stable at 2.83 5.  History of sick sinus syndrome status post pacemaker implant. Recent pacemaker generator change out on 05/10/2013. 6. History of coronary artery disease status post coronary artery bypass grafting. He currently denies chest pain, holding aspirin therapy given the possibility of upper GI bleed with guaiac positive stool and presenting hemoglobin of 6.5.  7. N.p.o. 8. DVT prophylaxis. Bilateral extremity SCDs    Code Status: Full Code Family Communication: Spoke with patient's son and wife who were present at bedside Disposition Plan: Will admit to telemetry, anticipate he will require greater than 2 nights hospitalization  Time spent: 70 min  Jeralyn Bennett [facility] Pager [phone]  **Disclaimer: This note may have been dictated with voice recognition software. Similar  sounding words can inadvertently be transcribed and this note may contain transcription errors which may not have been corrected upon publication of note.**

## 2013-06-21 NOTE — ED Notes (Signed)
Lab called for missing type and screen order. Dr. Lonie Peak gave verbal order, will enter for lab.

## 2013-06-21 NOTE — ED Notes (Signed)
Consent obtained and present at the bedside.

## 2013-06-21 NOTE — Consult Note (Signed)
Reason for Consult: Anemia and Heme positive stool Referring Physician: [facility]  [name] HPI: This is an 80 year old male with a PMH of CAD, s/p CABG, aortic valve replacement, and proxysmal afib on coumadin admitted to the hospital for weakness.  He states feels weak for the past several days and he was not able to ambulate.  Further evaluation in the ER revealed an INR of 4.26 and an HGB of 6.5 g/dL, however, he has a history of anemia in the recent past.  On 06/2012 his HGB was at 10.2 g/dL, but since that time his HGB varies between 6-8 g/dL.  No reports of hematochezia, but he has black stools from his iron supplementation.  On 03/2008 his EGD/Colonoscopy was only significant for diverticula.  No evidence of any inflammation, ulcerations, erosions, or vascular abnormalities.  The EGD was performed for complaints of epigastric pain.  The patient was in the hospital in April for a hematoma of his RLE and this was felt to be the source of his anemia.  Past Medical History  Diagnosis Date  . Shortness of breath with exertion  . Chronic kidney disease     not on dialysis yet  . Coronary artery disease 05/14/2010    stress test - no scintigraphic evidence of inducible myocardial ischemia,; normal study  . Hypertension   . Arthritis   . Blood dyscrasia     one time had low platlet count  . Dysrhythmia     atrial fib  . SSS (sick sinus syndrome) 08/27/2011    echo EF >55%, severe LAE, moderate RAE, tissue AVR w/ gradients 35 and  . Pericardial effusion 03/12/2011    echo EF 55-60%  . Claudication 02/19/2012    LE doppler no evidence of arterial insufficiency, lower extremities demonstrate normal values  w/ no evidence of insufficiency  . Lupus nephritis   . Pacemaker 09/19/2004    implanted  . Restless leg syndrome   . Cellulitis   . Anemia   . Benign prostatic hypertrophy   . Constipation   . Shoulder joint pain     Past Surgical History  Procedure Laterality Date   . Back surgery    . Appendectomy    . Hernia repair    . Pacemaker insertion  2006    Medtronic EnRhythm  . Av fistula placement  02/24/2011  . Aortic valve replacement  02/24/2011    pericardial tissue valve [facility] Ease  . Coronary artery bypass graft  02/24/2011    LIMA to LAD, SVG to distal RCA  . Cardiac catheterization  02/13/2011    mod/severe aortic valve stenosis w peak to peak gradient 25-32 mmHg,  70% stenosis LAD, 30-40%proximal followed by 90% sstenosis in RCA prior to anterior RV margin branch  . Lumbar laminectomy/decompression microdiscectomy Right 04/16/2012    Procedure: DECOMPRESSIVE L4 - L5/ MICRODISCECTOMY ON THE RIGHT 1 LEVEL;  Surgeon: Jacki Cones, MD;  Location: [facility];  Service: Orthopedics;  Laterality: Right;    Family History  Problem Relation Age of Onset  . Hypertension Mother   . Kidney disease Brother     Social History:  reports that he quit smoking about 56 years ago. His smoking use included Cigarettes. He smoked 0.00 packs per day. He has never used smokeless tobacco. He reports that he does not drink alcohol or use illicit drugs.  Allergies:  Allergies  Allergen Reactions  . Statins     myalgias  . Aspirin  Hives    Patient states that he can take the enteric coated but not the uncoated  . Feraheme [Ferumoxytol] Rash and Other (See Comments)    Abdominal pain  . Penicillins Hives    Medications:  Scheduled: . amiodarone  200 mg Oral Daily  . cycloSPORINE  1 drop Both Eyes BID  . metoprolol  50 mg Oral QAC breakfast  . pantoprazole (PROTONIX) IV  40 mg Intravenous Q12H  . pregabalin  50 mg Oral QHS  . rOPINIRole  2 mg Oral QHS  . sodium chloride  3 mL Intravenous Q12H   Continuous: . sodium chloride 50 mL/hr at 06/21/13 0732  . sodium chloride      Results for orders placed during the hospital encounter of 06/20/13 (from the past 24 hour(s))  CBC WITH DIFFERENTIAL     Status: Abnormal   Collection Time    06/21/13 12:34 AM       Result Value Ref Range   WBC 3.5 (*) 4.0 - 10.5 K/uL   RBC 2.24 (*) 4.22 - 5.81 MIL/uL   Hemoglobin 6.5 (*) 13.0 - 17.0 g/dL   HCT 16.120.5 (*) 09.639.0 - 04.552.0 %   MCV 91.5  78.0 - 100.0 fL   MCH 29.0  26.0 - 34.0 pg   MCHC 31.7  30.0 - 36.0 g/dL   RDW 40.918.6 (*) 81.111.5 - 91.415.5 %   Platelets 125 (*) 150 - 400 K/uL   Neutrophils Relative % 63  43 - 77 %   Lymphocytes Relative 12  12 - 46 %   Monocytes Relative 19 (*) 3 - 12 %   Eosinophils Relative 5  0 - 5 %   Basophils Relative 1  0 - 1 %   Neutro Abs 2.2  1.7 - 7.7 K/uL   Lymphs Abs 0.4 (*) 0.7 - 4.0 K/uL   Monocytes Absolute 0.7  0.1 - 1.0 K/uL   Eosinophils Absolute 0.2  0.0 - 0.7 K/uL   Basophils Absolute 0.0  0.0 - 0.1 K/uL   RBC Morphology POLYCHROMASIA PRESENT    SAMPLE TO BLOOD BANK     Status: None   Collection Time    06/21/13 12:34 AM      Result Value Ref Range   Blood Bank Specimen SAMPLE AVAILABLE FOR TESTING     Sample Expiration 06/22/2013    BASIC METABOLIC PANEL     Status: Abnormal   Collection Time    06/21/13 12:34 AM      Result Value Ref Range   Sodium 140  137 - 147 mEq/L   Potassium 3.7  3.7 - 5.3 mEq/L   Chloride 103  96 - 112 mEq/L   CO2 25  19 - 32 mEq/L   Glucose, Bld 101 (*) 70 - 99 mg/dL   BUN 74 (*) 6 - 23 mg/dL   Creatinine, Ser 7.822.83 (*) 0.50 - 1.35 mg/dL   Calcium 9.7  8.4 - 95.610.5 mg/dL   GFR calc non Af Amer 19 (*) >90 mL/min   GFR calc Af Amer 22 (*) >90 mL/min  PROTIME-INR     Status: Abnormal   Collection Time    06/21/13 12:34 AM      Result Value Ref Range   Prothrombin Time 39.3 (*) 11.6 - 15.2 seconds   INR 4.26 (*) 0.00 - 1.49  TYPE AND SCREEN     Status: None   Collection Time    06/21/13 12:34 AM      Result Value  Ref Range   ABO/RH(D) A POS     Antibody Screen NEG     Sample Expiration 06/24/2013     Unit Number [id number]     Blood Component Type RED CELLS,LR     Unit division 00     Status of Unit ISSUED     Transfusion Status OK TO TRANSFUSE     Crossmatch Result  Compatible     Unit Number [id number]     Blood Component Type RED CELLS,LR     Unit division 00     Status of Unit ISSUED     Transfusion Status OK TO TRANSFUSE     Crossmatch Result Compatible     Unit Number [id number]     Blood Component Type RED CELLS,LR     Unit division 00     Status of Unit ALLOCATED     Transfusion Status OK TO TRANSFUSE     Crossmatch Result Compatible     Unit Number [id number]     Blood Component Type RED CELLS,LR     Unit division 00     Status of Unit ALLOCATED     Transfusion Status OK TO TRANSFUSE     Crossmatch Result Compatible    TROPONIN I     Status: None   Collection Time    06/21/13 12:35 AM      Result Value Ref Range   Troponin I <0.30  <0.30 ng/mL  OCCULT BLOOD X 1 CARD TO LAB, STOOL     Status: Abnormal   Collection Time    06/21/13  1:13 AM      Result Value Ref Range   Fecal Occult Bld POSITIVE (*) NEGATIVE  PREPARE RBC (CROSSMATCH)     Status: None   Collection Time    06/21/13  2:00 AM      Result Value Ref Range   Order Confirmation ORDER PROCESSED BY BLOOD BANK    PROTIME-INR     Status: Abnormal   Collection Time    06/21/13  8:30 AM      Result Value Ref Range   Prothrombin Time 36.9 (*) 11.6 - 15.2 seconds   INR 3.92 (*) 0.00 - 1.49  BASIC METABOLIC PANEL     Status: Abnormal   Collection Time    06/21/13  8:30 AM      Result Value Ref Range   Sodium 142  137 - 147 mEq/L   Potassium 3.7  3.7 - 5.3 mEq/L   Chloride 106  96 - 112 mEq/L   CO2 24  19 - 32 mEq/L   Glucose, Bld 88  70 - 99 mg/dL   BUN 71 (*) 6 - 23 mg/dL   Creatinine, Ser 0.08 (*) 0.50 - 1.35 mg/dL   Calcium 9.2  8.4 - 67.6 mg/dL   GFR calc non Af Amer 20 (*) >90 mL/min   GFR calc Af Amer 24 (*) >90 mL/min  CBC     Status: Abnormal   Collection Time    06/21/13  8:30 AM      Result Value Ref Range   WBC 3.4 (*) 4.0 - 10.5 K/uL   RBC 2.68 (*) 4.22 - 5.81 MIL/uL   Hemoglobin 7.7 (*) 13.0 - 17.0 g/dL   HCT 19.5 (*) 09.3 - 26.7 %   MCV  91.0  78.0 - 100.0 fL   MCH 28.7  26.0 - 34.0 pg   MCHC 31.6  30.0 - 36.0 g/dL   RDW 12.4 (*)  11.5 - 15.5 %   Platelets 107 (*) 150 - 400 K/uL  HEMOGLOBIN AND HEMATOCRIT, BLOOD     Status: Abnormal   Collection Time    06/21/13 12:14 PM      Result Value Ref Range   Hemoglobin 7.7 (*) 13.0 - 17.0 g/dL   HCT 16.1 (*) 09.6 - 04.5 %     Dg Chest Port 1 View  06/21/2013   CLINICAL DATA:  Shortness of breath.  Anemia.  EXAM: PORTABLE CHEST - 1 VIEW  COMPARISON:  04/30/2011  FINDINGS: Chronic cardiopericardial enlargement. Aortic valve replacement and CABG. Unchanged orientation of right approach dual-chamber pacer leads.  Chronic reticulation of lung markings. No consolidation, edema, effusion, or pneumothorax.  IMPRESSION: No active disease.   Electronically Signed   By: Tiburcio Pea M.D.   On: 06/21/2013 01:52    ROS:  As stated above in the HPI otherwise negative.  Blood pressure 140/64, pulse 70, temperature 97.6 F (36.4 C), temperature source Oral, resp. rate 16, height 5\' 7"  (1.702 m), weight 167 lb 3.2 oz (75.841 kg), SpO2 94.00%.    PE: Gen: NAD, Alert and Oriented HEENT:  [place]/AT, EOMI Neck: Supple, no LAD Lungs: CTA Bilaterally CV: RRR without M/G/R ABM: Soft, NTND, +BS Ext: No C/C/E  Assessment/Plan: 1) Anemia. 2) Heme positive stool.   I will perform an EGD today.  If it is negative, I think a capsule endoscopy will be the next step as he had a negative colonoscopy in the past.    Plan: 1) EGD now.  Theda Belfast 06/21/2013, 2:18 PM

## 2013-06-21 NOTE — ED Notes (Signed)
Patient has no complaints of itching, chills, or pain. Will measure vitals and increase rate.

## 2013-06-21 NOTE — Progress Notes (Signed)
Pt resting in bed comfortably with capsule endoscopy monitor in place. Pt currently on clear liquid diet. Per capsule endoscopy bedside sheet, pt may have snack at 20:30 and resume regular diet at 4:30 AM. Will continue to monitor. Huel Coventry, RN

## 2013-06-21 NOTE — ED Notes (Signed)
1st unit of blood started at 0230 with 2 RN verification.

## 2013-06-21 NOTE — ED Notes (Addendum)
Fistula +for thrill and +for bruit in left lower arm.

## 2013-06-22 ENCOUNTER — Inpatient Hospital Stay (HOSPITAL_COMMUNITY): Admission: RE | Admit: 2013-06-22 | Payer: Medicare Other | Source: Ambulatory Visit

## 2013-06-22 LAB — BASIC METABOLIC PANEL
BUN: 61 mg/dL — ABNORMAL HIGH (ref 6–23)
CALCIUM: 9 mg/dL (ref 8.4–10.5)
CO2: 22 mEq/L (ref 19–32)
Chloride: 109 mEq/L (ref 96–112)
Creatinine, Ser: 2.46 mg/dL — ABNORMAL HIGH (ref 0.50–1.35)
GFR, EST AFRICAN AMERICAN: 26 mL/min — AB (ref >90)
GFR, EST NON AFRICAN AMERICAN: 22 mL/min — AB (ref >90)
Glucose, Bld: 89 mg/dL (ref 70–99)
Potassium: 3.9 mEq/L (ref 3.7–5.3)
SODIUM: 143 meq/L (ref 137–147)

## 2013-06-22 LAB — CBC
HCT: 24.2 % — ABNORMAL LOW (ref 39.0–52.0)
Hemoglobin: 7.6 g/dL — ABNORMAL LOW (ref 13.0–17.0)
MCH: 28.7 pg (ref 26.0–34.0)
MCHC: 31.4 g/dL (ref 30.0–36.0)
MCV: 91.3 fL (ref 78.0–100.0)
Platelets: 120 10*3/uL — ABNORMAL LOW (ref 150–400)
RBC: 2.65 MIL/uL — ABNORMAL LOW (ref 4.22–5.81)
RDW: 18.8 % — ABNORMAL HIGH (ref 11.5–15.5)
WBC: 3.6 10*3/uL — ABNORMAL LOW (ref 4.0–10.5)

## 2013-06-22 LAB — PROTIME-INR
INR: 2.39 — AB (ref 0.00–1.49)
Prothrombin Time: 25.3 seconds — ABNORMAL HIGH (ref 11.6–15.2)

## 2013-06-22 LAB — PREPARE RBC (CROSSMATCH)

## 2013-06-22 MED ORDER — LACTULOSE 10 GM/15ML PO SOLN
20.0000 g | Freq: Once | ORAL | Status: AC
  Administered 2013-06-22: 20 g via ORAL
  Filled 2013-06-22: qty 30

## 2013-06-22 MED ORDER — FUROSEMIDE 10 MG/ML IJ SOLN
40.0000 mg | Freq: Once | INTRAMUSCULAR | Status: AC
  Administered 2013-06-22: 40 mg via INTRAVENOUS
  Filled 2013-06-22: qty 4

## 2013-06-22 NOTE — Progress Notes (Signed)
Subjective: No acute events.  Objective: Vital signs in last 24 hours: Temp:  [96.6 F (35.9 C)-97.6 F (36.4 C)] 97.6 F (36.4 C) (06/03 0454) Pulse Rate:  [68-72] 72 (06/03 0454) Resp:  [15-24] 18 (06/03 0454) BP: (114-151)/(48-70) 115/58 mmHg (06/03 0454) SpO2:  [93 %-100 %] 96 % (06/03 0454) Weight:  [168 lb 1.6 oz (76.25 kg)] 168 lb 1.6 oz (76.25 kg) (06/03 0454)    Intake/Output from previous day: 06/02 0701 - 06/03 0700 In: 1273.3 [P.O.:600; I.V.:673.3] Out: 5 [Urine:5] Intake/Output this shift:    General appearance: alert and no distress GI: soft, non-tender; bowel sounds normal; no masses,  no organomegaly  Lab Results:  Recent Labs  06/21/13 0034 06/21/13 0830 06/21/13 1214 06/22/13 0305  WBC 3.5* 3.4*  --  3.6*  HGB 6.5* 7.7* 7.7* 7.6*  HCT 20.5* 24.4* 24.0* 24.2*  PLT 125* 107*  --  120*   BMET  Recent Labs  06/21/13 0034 06/21/13 0830 06/22/13 0305  NA 140 142 143  K 3.7 3.7 3.9  CL 103 106 109  CO2 25 24 22   GLUCOSE 101* 88 89  BUN 74* 71* 61*  CREATININE 2.83* 2.66* 2.46*  CALCIUM 9.7 9.2 9.0   LFT No results found for this basename: PROT, ALBUMIN, AST, ALT, ALKPHOS, BILITOT, BILIDIR, IBILI,  in the last 72 hours PT/INR  Recent Labs  06/21/13 0034 06/21/13 0830  LABPROT 39.3* 36.9*  INR 4.26* 3.92*   Hepatitis Panel No results found for this basename: HEPBSAG, HCVAB, HEPAIGM, HEPBIGM,  in the last 72 hours C-Diff No results found for this basename: CDIFFTOX,  in the last 72 hours Fecal Lactopherrin No results found for this basename: FECLLACTOFRN,  in the last 72 hours  Studies/Results: Dg Chest Port 1 View  06/21/2013   CLINICAL DATA:  Shortness of breath.  Anemia.  EXAM: PORTABLE CHEST - 1 VIEW  COMPARISON:  04/30/2011  FINDINGS: Chronic cardiopericardial enlargement. Aortic valve replacement and CABG. Unchanged orientation of right approach dual-chamber pacer leads.  Chronic reticulation of lung markings. No consolidation,  edema, effusion, or pneumothorax.  IMPRESSION: No active disease.   Electronically Signed   By: Tiburcio Pea M.D.   On: 06/21/2013 01:52    Medications:  Scheduled: . amiodarone  200 mg Oral Daily  . cycloSPORINE  1 drop Both Eyes BID  . metoprolol  50 mg Oral QAC breakfast  . pantoprazole (PROTONIX) IV  40 mg Intravenous Q12H  . pregabalin  50 mg Oral QHS  . rOPINIRole  2 mg Oral QHS  . sodium chloride  3 mL Intravenous Q12H   Continuous: . sodium chloride 50 mL/hr at 06/22/13 0243  . sodium chloride      Assessment/Plan: 1) Anemia. 2) Heme positive stool.   The patient feels well.  EGD was essentially negative.  The capsule endoscopy result should be available tomorrow.  Plan: 1) Await capsule report. 2) Follow HGB and transfuse as necessary.   LOS: 2 days   Theda Belfast 06/22/2013, 7:15 AM

## 2013-06-22 NOTE — Progress Notes (Signed)
PROGRESS NOTE  Nathaniel Steele GGY:[id number] DOB: March 03, 1926 DOA: 06/20/2013 PCP: Delorse Lek, MD  Assessment/Plan: Anemia- ABLA, appears to be from GI bleeding, s/p 2 units PRBC- still low so will give 2 more units in light of CKD  Heme positive stool: s/p EGD; currently getting a capsule endoscopy  History of paroxysmal atrial fibrillation.  -continue amiodarone 200 mg by mouth daily and metoprolol 50 mg by mouth daily   Stage IV chronic kidney disease, status post AV fistula. Creatinine stable   History of sick sinus syndrome status post pacemaker implant. Recent pacemaker generator change out on 05/10/2013.   History of coronary artery disease status post coronary artery bypass grafting. He currently denies chest pain, holding aspirin therapy given the possibility of upper GI bleed with guaiac positive stool and presenting hemoglobin of 6.5.    Code Status: full Family Communication: patient/wife on phone- Mrs Nathaniel Steele Disposition Plan:    Consultants:  GI  Procedures:      HPI/Subjective: Eating well, no pain  Objective: Filed Vitals:   06/22/13 0454  BP: 115/58  Pulse: 72  Temp: 97.6 F (36.4 C)  Resp: 18    Intake/Output Summary (Last 24 hours) at 06/22/13 0836 Last data filed at 06/22/13 0700  Gross per 24 hour  Intake 1513.33 ml  Output      5 ml  Net 1508.33 ml   Filed Weights   06/21/13 0318 06/22/13 0454  Weight: 75.841 kg (167 lb 3.2 oz) 76.25 kg (168 lb 1.6 oz)    Exam:   General:  A+Ox3, NAD  Cardiovascular: rrr  Respiratory: clear  Abdomen: +Bs, soft  Musculoskeletal: moves all 4 ext  Data Reviewed: Basic Metabolic Panel:  Recent Labs Lab 06/21/13 0034 06/21/13 0830 06/22/13 0305  NA 140 142 143  K 3.7 3.7 3.9  CL 103 106 109  CO2 25 24 22   GLUCOSE 101* 88 89  BUN 74* 71* 61*  CREATININE 2.83* 2.66* 2.46*  CALCIUM 9.7 9.2 9.0   Liver Function Tests: No results found for this basename: AST, ALT, ALKPHOS, BILITOT,  PROT, ALBUMIN,  in the last 168 hours No results found for this basename: LIPASE, AMYLASE,  in the last 168 hours No results found for this basename: AMMONIA,  in the last 168 hours CBC:  Recent Labs Lab 06/21/13 0034 06/21/13 0830 06/21/13 1214 06/22/13 0305  WBC 3.5* 3.4*  --  3.6*  NEUTROABS 2.2  --   --   --   HGB 6.5* 7.7* 7.7* 7.6*  HCT 20.5* 24.4* 24.0* 24.2*  MCV 91.5 91.0  --  91.3  PLT 125* 107*  --  120*   Cardiac Enzymes:  Recent Labs Lab 06/21/13 0035  TROPONINI <0.30   BNP (last 3 results) No results found for this basename: PROBNP,  in the last 8760 hours CBG: No results found for this basename: GLUCAP,  in the last 168 hours  No results found for this or any previous visit (from the past 240 hour(s)).   Studies: Dg Chest Port 1 View  06/21/2013   CLINICAL DATA:  Shortness of breath.  Anemia.  EXAM: PORTABLE CHEST - 1 VIEW  COMPARISON:  04/30/2011  FINDINGS: Chronic cardiopericardial enlargement. Aortic valve replacement and CABG. Unchanged orientation of right approach dual-chamber pacer leads.  Chronic reticulation of lung markings. No consolidation, edema, effusion, or pneumothorax.  IMPRESSION: No active disease.   Electronically Signed   By: Tiburcio Pea M.D.   On: 06/21/2013 01:52  Scheduled Meds: . amiodarone  200 mg Oral Daily  . cycloSPORINE  1 drop Both Eyes BID  . metoprolol  50 mg Oral QAC breakfast  . pantoprazole (PROTONIX) IV  40 mg Intravenous Q12H  . pregabalin  50 mg Oral QHS  . rOPINIRole  2 mg Oral QHS  . sodium chloride  3 mL Intravenous Q12H   Continuous Infusions: . sodium chloride 50 mL/hr at 06/22/13 0243  . sodium chloride     Antibiotics Given (last 72 hours)   None      Principal Problem:   Acute post-hemorrhagic anemia Active Problems:   Chronic renal failure, pt has AVF LUE   PAF (paroxysmal atrial fibrillation), on Amiodarone prior to admission   Chronic anticoagulation, on Coumadin prior to admission    Warfarin-induced coagulopathy   Symptomatic anemia   Guaiac positive stools   GI bleed    Time spent: 25 min    Joseph ArtJessica U Inge Waldroup  [facility] Pager [phone]. If 7PM-7AM, please contact night-coverage at [email or web address], password [email or web address] 06/22/2013, 8:36 AM  LOS: 2 days

## 2013-06-23 ENCOUNTER — Ambulatory Visit: Payer: Medicare Other | Admitting: Physical Therapy

## 2013-06-23 ENCOUNTER — Other Ambulatory Visit: Payer: Self-pay

## 2013-06-23 ENCOUNTER — Encounter (HOSPITAL_COMMUNITY): Payer: Self-pay | Admitting: Gastroenterology

## 2013-06-23 LAB — BASIC METABOLIC PANEL
BUN: 54 mg/dL — ABNORMAL HIGH (ref 6–23)
CO2: 20 mEq/L (ref 19–32)
Calcium: 9.2 mg/dL (ref 8.4–10.5)
Chloride: 110 mEq/L (ref 96–112)
Creatinine, Ser: 2.34 mg/dL — ABNORMAL HIGH (ref 0.50–1.35)
GFR calc Af Amer: 28 mL/min — ABNORMAL LOW (ref >90)
GFR, EST NON AFRICAN AMERICAN: 24 mL/min — AB (ref >90)
Glucose, Bld: 98 mg/dL (ref 70–99)
Potassium: 3.8 mEq/L (ref 3.7–5.3)
SODIUM: 143 meq/L (ref 137–147)

## 2013-06-23 LAB — CBC
HCT: 29.1 % — ABNORMAL LOW (ref 39.0–52.0)
HEMOGLOBIN: 9.4 g/dL — AB (ref 13.0–17.0)
MCH: 29.4 pg (ref 26.0–34.0)
MCHC: 32.3 g/dL (ref 30.0–36.0)
MCV: 90.9 fL (ref 78.0–100.0)
Platelets: 118 10*3/uL — ABNORMAL LOW (ref 150–400)
RBC: 3.2 MIL/uL — ABNORMAL LOW (ref 4.22–5.81)
RDW: 18.9 % — ABNORMAL HIGH (ref 11.5–15.5)
WBC: 4.6 10*3/uL (ref 4.0–10.5)

## 2013-06-23 MED ORDER — BISACODYL 10 MG RE SUPP: 10.0000 mg | Freq: Every day | RECTAL | Status: DC | PRN

## 2013-06-23 MED ORDER — PANTOPRAZOLE SODIUM 40 MG PO TBEC: 40.0000 mg | DELAYED_RELEASE_TABLET | Freq: Two times a day (BID) | ORAL | Status: DC

## 2013-06-23 MED ORDER — PANTOPRAZOLE SODIUM 40 MG PO TBEC
40.0000 mg | DELAYED_RELEASE_TABLET | Freq: Two times a day (BID) | ORAL | Status: DC
  Administered 2013-06-23: 40 mg via ORAL
  Filled 2013-06-23: qty 1

## 2013-06-23 MED ORDER — FUROSEMIDE 80 MG PO TABS
160.0000 mg | ORAL_TABLET | Freq: Two times a day (BID) | ORAL | Status: DC
  Administered 2013-06-23: 160 mg via ORAL
  Filled 2013-06-23 (×3): qty 2

## 2013-06-23 MED ORDER — WARFARIN SODIUM 2 MG PO TABS: 2.0000 mg | ORAL_TABLET | Freq: Every day | ORAL | Status: DC

## 2013-06-23 NOTE — Evaluation (Addendum)
Physical Therapy Evaluation Patient Details Name: BOBBI SCHAUS MRN: [id number] DOB: 19-May-1926 Today's Date: 06/23/2013   History of Present Illness   Pt is a 80 y.o. Male adm due to generalized weakness. Pt treated for anemia due to GI bleeding. PMH includes coronary artery disease status post coronary artery bypass grafting, aortic valve replacement with biological prosthesis, paroxysmal atrial fibrillation who is on anticoagulation with warfarin.   Clinical Impression  Pt adm from home due to above. Presents with high level balance deficits at this time. Recommended to pt and wife that pt ambulate with RW upon acute D/C to improve stability with gt; both were agreeable. Wife also reports she will provide 24/7 (A) for safety upon return home. Pt to benefit from OPPT for high level balance activity and to increase activity tolerance. If pt continues to stay in acute setting; pt will benefit from skilled acute PT to address deficits listed below and maximize functional mobility.     Follow Up Recommendations Outpatient PT;Supervision/Assistance - 24 hour (neuro OPPT )    Equipment Recommendations  None recommended by PT    Recommendations for Other Services       Precautions / Restrictions Precautions Precautions: Fall Restrictions Weight Bearing Restrictions: No      Mobility  Bed Mobility Overal bed mobility: Independent             General bed mobility comments: no physical (A) needed   Transfers Overall transfer level: Modified independent Equipment used: None             General transfer comment: no sway noted; use of arm rests   Ambulation/Gait Ambulation/Gait assistance: Supervision Ambulation Distance (Feet): 300 Feet Assistive device: None Gait Pattern/deviations: Step-through pattern;Staggering left;Scissoring Gait velocity: decreased due to fatigue  Gait velocity interpretation: Below normal speed for age/gender General Gait Details: pt at  supervision level for safety for majority of ambulation; required min guard with single balance disturbance when challenged with head turns; cues for safety and to slow down; encouraged pt to ambulate with RW to increase stability and reduce risk of falls   Stairs Stairs: Yes Stairs assistance: Min guard Stair Management: One rail Right;Step to pattern;Forwards Number of Stairs: 4 General stair comments: cues for safety; min guard to steady  Wheelchair Mobility    Modified Rankin (Stroke Patients Only)       Balance Overall balance assessment: Needs assistance Sitting-balance support: Feet supported;No upper extremity supported Sitting balance-Leahy Scale: Good                       High level balance activites: Direction changes;Sudden stops;Head turns;Turns High Level Balance Comments: requires min guard with high level balance; will require use of his RW to increase stability upon D/C              Pertinent Vitals/Pain No c/o pain     Home Living Family/patient expects to be discharged to:: Private residence Living Arrangements: Spouse/significant other Available Help at Discharge: Family;Available 24 hours/day Type of Home: House Home Access: Level entry     Home Layout: Two level Home Equipment: Walker - 2 wheels;Cane - single point;Tub bench Additional Comments: Walks down to basement to use walk-in shower as he cannot step into the tub shower on the main level. Has a tub bench but does not like using it.     Prior Function Level of Independence: Independent         Comments: pt gardens; rides tractors  Hand Dominance   Dominant Hand: Right    Extremity/Trunk Assessment   Upper Extremity Assessment: Overall WFL for tasks assessed           Lower Extremity Assessment: Overall WFL for tasks assessed      Cervical / Trunk Assessment: Normal  Communication   Communication: HOH  Cognition Arousal/Alertness: Awake/alert Behavior  During Therapy: WFL for tasks assessed/performed Overall Cognitive Status: Within Functional Limits for tasks assessed Area of Impairment: Safety/judgement         Safety/Judgement: Decreased awareness of safety     General Comments: pt with questionable safety awareness; may be baseline ?     General Comments General comments (skin integrity, edema, etc.): educated pt and wife on recommendations regarding use of RW for mobility and to avoid going up/down to basement at ths time     Exercises        Assessment/Plan    PT Assessment Patient needs continued PT services  PT Diagnosis Abnormality of gait   PT Problem List Decreased balance;Decreased mobility;Decreased activity tolerance;Decreased knowledge of use of DME  PT Treatment Interventions DME instruction;Gait training;Stair training;Functional mobility training;Therapeutic activities;Therapeutic exercise;Balance training;Neuromuscular re-education;Patient/family education   PT Goals (Current goals can be found in the Care Plan section) Acute Rehab PT Goals Patient Stated Goal: home today PT Goal Formulation: With patient Time For Goal Achievement: 06/25/13 Potential to Achieve Goals: Good    Frequency Min 3X/week   Barriers to discharge        Co-evaluation               End of Session Equipment Utilized During Treatment: Gait belt Activity Tolerance: Patient tolerated treatment well Patient left: in chair;with call bell/phone within reach;with family/visitor present Nurse Communication: Mobility status         Time: 1052-1110 PT Time Calculation (min): 18 min   Charges:   PT Evaluation $Initial PT Evaluation Tier I: 1 Procedure PT Treatments $Gait Training: 8-22 mins   PT G CodesDonell Sievert:          Mirenda Baltazar N Sharalyn Lomba, [place]PT  [phone] 06/23/2013, 1:41 PM

## 2013-06-23 NOTE — Progress Notes (Signed)
While checking orders saw pt had order written at 0900 to dc telemetry when I went into the room pt stated that he was having 8/10 chest pain he stated that it was sharp and comes and goes vital signs are WNL and charted in epic EKG done with no changes from previous EKG left patient on telemetry and notified CMS that he is to remain on Tele. Pt is pain free without any intervention except for increase of O2 to 4L  . Will continue to monitor. Ilean Skill LPN

## 2013-06-23 NOTE — Progress Notes (Signed)
1424 06-23-13 Cm faxed information to [facility]. No further needs from CM at this time. Lamar Laundry Britton, RN,BSN [phone]

## 2013-06-23 NOTE — Progress Notes (Signed)
Discussed discharge summary with pt and pt wife. Pt received Rx. Reviewed all medications with pt. Pt need was met regarding questions about appointments.

## 2013-06-23 NOTE — Discharge Summary (Signed)
Physician Discharge Summary  Nathaniel Steele [place] ZOX:[id number] DOB: 11/07/1926 DOA: 06/20/2013  PCP: Delorse LekBURNETT,BRENT A, MD  Admit date: 06/20/2013 Discharge date: 06/23/2013  Time spent: 35 minutes  Recommendations for Outpatient Follow-up:  Cbc 1-2 weeks Coumadin resumed at lower dose and will require more frequent monitoring PT/INR on Monday/Tuesday  Discharge Diagnoses:  Principal Problem:   Acute post-hemorrhagic anemia Active Problems:   Chronic renal failure, pt has AVF LUE   PAF (paroxysmal atrial fibrillation), on Amiodarone prior to admission   Chronic anticoagulation, on Coumadin prior to admission   Warfarin-induced coagulopathy   Symptomatic anemia   Guaiac positive stools   GI bleed   Discharge Condition: improved  Diet recommendation: cardiac  Filed Weights   06/21/13 0318 06/22/13 0454  Weight: 75.841 kg (167 lb 3.2 oz) 76.25 kg (168 lb 1.6 oz)    History of present illness:  Nathaniel Steele Cincotta is a 80 y.o. male with a past medical history of coronary artery disease status post coronary artery bypass grafting, aortic valve replacement with biological prosthesis, paroxysmal atrial fibrillation who is on anticoagulation with warfarin, recently admitted to the medicine service on 05/08/2013, discharged on 05/10/2013 at which time he was treated for hematoma of right lower extremity associated with anemia requiring transfusion with 2 units of packed red blood cells. He presented on 05/08/2013 with an INR 3.19. On today's visit he complains of a three-day history of generalized weakness, fatigue, poor tolerance to physical exertion, shortness of breath, dizziness, has family members report that he has spent most of the day sitting on a recliner. He also complains of epigastric pain characterized as sharp, stabbing, not associated with by mouth intake. He reports dark stools however attributes this to iron pills. Labs in the emergency room revealed a supratherapeutic INR of 4.26 with CBC  showing a hemoglobin of 6.5. He was discharged on 05/10/2013 with a hemoglobin of 8.7. In the ER he was found to be guaiac positive. Patient was typed and crossed with first unit of packed red blood cells transfused in the emergency department. He denies history of GI bleed, reports having his last colonoscopy about 5 years ago performed by Dr. Elnoria HowardHung of gastroenterology   Hospital Course:  Anemia- ABLA, appears to be from GI bleeding, s/p 4 units PRBC- still low so will give 2 more units in light of CKD   Heme positive stool: s/p EGD;  capsule endoscopy did not show source  History of paroxysmal atrial fibrillation.  -continue amiodarone 200 mg by mouth daily and metoprolol 50 mg by mouth daily   Stage IV chronic kidney disease, status post AV fistula. Creatinine stable   History of sick sinus syndrome status post pacemaker implant. Recent pacemaker generator change out on 05/10/2013.   History of coronary artery disease status post coronary artery bypass grafting. He currently denies chest pain, holding aspirin therapy given the possibility of upper GI bleed with guaiac positive stool and presenting hemoglobin of 6.5.      Procedures:  none  Consultations:  GI  Discharge Exam: Filed Vitals:   06/23/13 0632  BP: 136/66  Pulse: 71  Temp: 98.4 F (36.9 Steele)  Resp: 18    General: A+Ox3, NAD Cardiovascular: ir Respiratory: clear  Discharge Instructions You were cared for by a hospitalist during your hospital stay. If you have any questions about your discharge medications or the care you received while you were in the hospital after you are discharged, you can call the unit and asked to speak  with the hospitalist on call if the hospitalist that took care of you is not available. Once you are discharged, your primary care physician will handle any further medical issues. Please note that NO REFILLS for any discharge medications will be authorized once you are discharged, as it is  imperative that you return to your primary care physician (or establish a relationship with a primary care physician if you do not have one) for your aftercare needs so that they can reassess your need for medications and monitor your lab values.  Discharge Instructions   Diet - low sodium heart healthy    Complete by:  As directed      Discharge instructions    Complete by:  As directed   PT/INR on Monday/Tuesday Watch for signs of bleeding     Increase activity slowly    Complete by:  As directed             Medication List    STOP taking these medications       amLODipine 10 MG tablet  Commonly known as:  NORVASC      TAKE these medications       amiodarone 200 MG tablet  Commonly known as:  PACERONE  Take 200 mg by mouth daily.     aspirin EC 81 MG tablet  Take 81 mg by mouth daily.     calcitRIOL 0.25 MCG capsule  Commonly known as:  ROCALTROL  Take 0.25 mcg by mouth daily before breakfast.     cycloSPORINE 0.05 % ophthalmic emulsion  Commonly known as:  RESTASIS  Place 1 drop into both eyes 2 (two) times daily.     ferrous sulfate 325 (65 FE) MG tablet  Take 325 mg by mouth daily with breakfast.     finasteride 5 MG tablet  Commonly known as:  PROSCAR  Take 5 mg by mouth daily.     fish oil-omega-3 fatty acids 1000 MG capsule  Take 1 g by mouth daily.     furosemide 40 MG tablet  Commonly known as:  LASIX  Take 160 mg by mouth 2 (two) times daily. Takes 4 tablets     metoprolol 50 MG tablet  Commonly known as:  LOPRESSOR  Take 50 mg by mouth daily before breakfast.     OCUVITE PRESERVISION PO  Take 1 tablet by mouth daily.     pantoprazole 40 MG tablet  Commonly known as:  PROTONIX  Take 1 tablet (40 mg total) by mouth 2 (two) times daily.     polyethylene glycol packet  Commonly known as:  MIRALAX / GLYCOLAX  Take 17 g by mouth daily as needed for mild constipation.     pregabalin 25 MG capsule  Commonly known as:  LYRICA  Take 50 mg by  mouth at bedtime.     rOPINIRole 1 MG tablet  Commonly known as:  REQUIP  Take 2 mg by mouth at bedtime.     VITAMIN B 12 PO  Take 1 tablet by mouth daily.     warfarin 2 MG tablet  Commonly known as:  COUMADIN  Take 1 tablet (2 mg total) by mouth daily.       Allergies  Allergen Reactions  . Statins     myalgias  . Aspirin Hives    Patient states that he can take the enteric coated but not the uncoated  . Feraheme [Ferumoxytol] Rash and Other (See Comments)    Abdominal pain  . Penicillins  Hives       Follow-up Information   Follow up with BURNETT,BRENT A, MD In 1 week.   Specialty:  Family Medicine   Contact information:   [address] [place] [address] [phone]       Follow up with Theda Belfast, MD.   Specialty:  Gastroenterology   Contact information:   [address], [address] [place] [address] [phone]       Follow up with Thurmon Fair, MD. (for PT/INT check)    Specialty:  Cardiology   Contact information:   [address] [place] [address] [phone]        The results of significant diagnostics from this hospitalization (including imaging, microbiology, ancillary and laboratory) are listed below for reference.    Significant Diagnostic Studies: Dg Chest Port 1 View  06/21/2013   CLINICAL DATA:  Shortness of breath.  Anemia.  EXAM: PORTABLE CHEST - 1 VIEW  COMPARISON:  04/30/2011  FINDINGS: Chronic cardiopericardial enlargement. Aortic valve replacement and CABG. Unchanged orientation of right approach dual-chamber pacer leads.  Chronic reticulation of lung markings. No consolidation, edema, effusion, or pneumothorax.  IMPRESSION: No active disease.   Electronically Signed   By: Tiburcio Pea M.D.   On: 06/21/2013 01:52    Microbiology: No results found for this or any previous visit (from the past 240 hour(s)).   Labs: Basic Metabolic Panel:  Recent Labs Lab 06/21/13 0034  06/21/13 0830 06/22/13 0305 06/23/13 0410  NA 140 142 143 143  K 3.7 3.7 3.9 3.8  CL 103 106 109 110  CO2 25 24 22 20   GLUCOSE 101* 88 89 98  BUN 74* 71* 61* 54*  CREATININE 2.83* 2.66* 2.46* 2.34*  CALCIUM 9.7 9.2 9.0 9.2   Liver Function Tests: No results found for this basename: AST, ALT, ALKPHOS, BILITOT, PROT, ALBUMIN,  in the last 168 hours No results found for this basename: LIPASE, AMYLASE,  in the last 168 hours No results found for this basename: AMMONIA,  in the last 168 hours CBC:  Recent Labs Lab 06/21/13 0034 06/21/13 0830 06/21/13 1214 06/22/13 0305 06/23/13 0410  WBC 3.5* 3.4*  --  3.6* 4.6  NEUTROABS 2.2  --   --   --   --   HGB 6.5* 7.7* 7.7* 7.6* 9.4*  HCT 20.5* 24.4* 24.0* 24.2* 29.1*  MCV 91.5 91.0  --  91.3 90.9  PLT 125* 107*  --  120* 118*   Cardiac Enzymes:  Recent Labs Lab 06/21/13 0035  TROPONINI <0.30   BNP: BNP (last 3 results) No results found for this basename: PROBNP,  in the last 8760 hours CBG: No results found for this basename: GLUCAP,  in the last 168 hours     Signed:  Joseph Art  [facility] 06/23/2013, 12:54 PM

## 2013-06-23 NOTE — Progress Notes (Signed)
Subjective: No complaints this morning.  Objective: Vital signs in last 24 hours: Temp:  [97.4 F (36.3 C)-98.7 F (37.1 C)] 98.4 F (36.9 C) (06/04 1740) Pulse Rate:  [69-73] 71 (06/04 0632) Resp:  [18-20] 18 (06/04 8144) BP: (97-143)/(44-82) 136/66 mmHg (06/04 0632) SpO2:  [95 %-99 %] 96 % (06/04 8185) Last BM Date: 06/23/13  Intake/Output from previous day: 06/03 0701 - 06/04 0700 In: -  Out: 600 [Urine:600] Intake/Output this shift:    General appearance: alert and no distress GI: soft, non-tender; bowel sounds normal; no masses,  no organomegaly  Lab Results:  Recent Labs  06/21/13 0830 06/21/13 1214 06/22/13 0305 06/23/13 0410  WBC 3.4*  --  3.6* 4.6  HGB 7.7* 7.7* 7.6* 9.4*  HCT 24.4* 24.0* 24.2* 29.1*  PLT 107*  --  120* 118*   BMET  Recent Labs  06/21/13 0830 06/22/13 0305 06/23/13 0410  NA 142 143 143  K 3.7 3.9 3.8  CL 106 109 110  CO2 24 22 20   GLUCOSE 88 89 98  BUN 71* 61* 54*  CREATININE 2.66* 2.46* 2.34*  CALCIUM 9.2 9.0 9.2   LFT No results found for this basename: PROT, ALBUMIN, AST, ALT, ALKPHOS, BILITOT, BILIDIR, IBILI,  in the last 72 hours PT/INR  Recent Labs  06/21/13 0830 06/22/13 0813  LABPROT 36.9* 25.3*  INR 3.92* 2.39*   Hepatitis Panel No results found for this basename: HEPBSAG, HCVAB, HEPAIGM, HEPBIGM,  in the last 72 hours C-Diff No results found for this basename: CDIFFTOX,  in the last 72 hours Fecal Lactopherrin No results found for this basename: FECLLACTOFRN,  in the last 72 hours  Studies/Results: No results found.  Medications:  Scheduled: . amiodarone  200 mg Oral Daily  . cycloSPORINE  1 drop Both Eyes BID  . furosemide  160 mg Oral BID  . metoprolol  50 mg Oral QAC breakfast  . pantoprazole (PROTONIX) IV  40 mg Intravenous Q12H  . pregabalin  50 mg Oral QHS  . rOPINIRole  2 mg Oral QHS  . sodium chloride  3 mL Intravenous Q12H   Continuous: . sodium chloride      Assessment/Plan: 1)  Anemia. 2) Heme positive stool. 3) Melena.   No further evidence of any bleeding.  His capsule endoscopy was negative for any bleeding sites.  No reports of chest pain at this time and his HGB has increased appropriately with transfusion.  Plan: 1) No further GI intervention at this time. 2) I will have him follow up in the office to track his HGB. 3) Signing off.   LOS: 3 days   Nathaniel Steele 06/23/2013, 9:37 AM

## 2013-06-24 LAB — TYPE AND SCREEN
ABO/RH(D): A POS
Antibody Screen: NEGATIVE
UNIT DIVISION: 0
UNIT DIVISION: 0
Unit division: 0
Unit division: 0
Unit division: 0
Unit division: 0

## 2013-06-27 ENCOUNTER — Ambulatory Visit (INDEPENDENT_AMBULATORY_CARE_PROVIDER_SITE_OTHER): Payer: Medicare Other | Admitting: Pharmacist Clinician (PhC)/ Clinical Pharmacy Specialist

## 2013-06-27 DIAGNOSIS — Z7901 Long term (current) use of anticoagulants: Secondary | ICD-10-CM

## 2013-06-27 DIAGNOSIS — I4891 Unspecified atrial fibrillation: Secondary | ICD-10-CM

## 2013-06-27 DIAGNOSIS — I480 Paroxysmal atrial fibrillation: Secondary | ICD-10-CM

## 2013-06-27 LAB — POCT INR: INR: 1.2

## 2013-07-04 ENCOUNTER — Ambulatory Visit: Payer: Medicare Other | Attending: Family Medicine | Admitting: Physical Therapy

## 2013-07-04 ENCOUNTER — Ambulatory Visit: Payer: Medicare Other | Admitting: Pharmacist Clinician (PhC)/ Clinical Pharmacy Specialist

## 2013-07-04 DIAGNOSIS — IMO0001 Reserved for inherently not codable concepts without codable children: Secondary | ICD-10-CM | POA: Insufficient documentation

## 2013-07-04 DIAGNOSIS — R5381 Other malaise: Secondary | ICD-10-CM | POA: Insufficient documentation

## 2013-07-04 DIAGNOSIS — M545 Low back pain, unspecified: Secondary | ICD-10-CM | POA: Diagnosis not present

## 2013-07-05 ENCOUNTER — Ambulatory Visit (INDEPENDENT_AMBULATORY_CARE_PROVIDER_SITE_OTHER): Payer: Medicare Other | Admitting: Neurology

## 2013-07-05 ENCOUNTER — Encounter: Payer: Self-pay | Admitting: Neurology

## 2013-07-05 VITALS — BP 85/50 | HR 90 | Ht 67.0 in | Wt 165.0 lb

## 2013-07-05 DIAGNOSIS — G609 Hereditary and idiopathic neuropathy, unspecified: Secondary | ICD-10-CM

## 2013-07-05 NOTE — Patient Instructions (Signed)
Overall you are doing fairly well but I do want to suggest a few things today:   As far as your medications are concerned, I would like to suggest holding off on making any changes at this time pending further testing  As far as diagnostic testing:  1)Please schedule an EMG/Nerve conduction study when you check out  I would like to see you back in 6 months, sooner if we need to. Please call us with any interim questions, concerns, problems, updates or refill requests.   My clinical assistant and will answer any of your questions and relay your messages to me and also relay most of my messages to you.   Our phone number is [phone]. We also have an after hours call service for urgent matters and there is a physician on-call for urgent questions. For any emergencies you know to call 911 or go to the nearest emergency room

## 2013-07-05 NOTE — Progress Notes (Signed)
[facility]    Provider:  Dr Hosie Poisson Referring Provider: Delorse Lek, MD Primary Care Physician:  Delorse Lek, MD  CC:  neuropathy  HPI:  KRISTY CATOE is a 80 y.o. male here as a follow up from Dr. Doristine Counter for neuropathy and RLS. Last visit was 02/2013 at which time he was switched to gabapentin 100mg  nightly and the Requip was increased to 1mg  nightly. Notes no overall improvement in symptoms. Continues to have pain and discomfort with sitting, improves with movement. He notes history of severe frost bite while serving in the [employer].   Recently in the hospital for severe anemia, was transfused four units. Workup still ongoing, scheduled to follow up with GI.   Initial visit 11/2012: Has had RLS symptoms for years, is starting to get worse. In the past would get better with movement but now more continuous. Symptoms are worse during evening hours, when resting and not moving around. Feels good during the daytime when moving. Having difficulty sleeping due to sensation. Feels the sensation on both legs, described as a "crawling sensation". Has urge to move, gets better with movement. Also notes some burning sensation in his distal LE. Recently given Lyrica 50mg  at night time, just started a few days ago. Currently taking Requip 1.5mg  at lunch and 1.5mg  at bedtime. Reports he has been told he has iron deficiency in the past, took supplement and then told to stop.   Did get frost bite from serving [place] in [place], and told the past this could be contributing to his pain.  No DM. No EtOH or tobacco.   Review of Systems: Out of a complete 14 system review, the patient complains of only the following symptoms, and all other reviewed systems are negative. Positive for back pain restless legs leg swelling  History   Social History  . Marital Status: Married    Spouse Name: [name]    Number of Children: 1  . Years of Education: 6   Occupational History  . Not on  file.   Social History Main Topics  . Smoking status: Former Smoker    Types: Cigarettes    Quit date: 02/11/1957  . Smokeless tobacco: Never Used  . Alcohol Use: No  . Drug Use: No  . Sexual Activity: Not on file   Other Topics Concern  . Not on file   Social History Narrative   Patient is married ([name]).   Patient has one child.   Patient does not drink caffeine.   Patient is right-handed.   Patient is retired.   Patient has a 6th grade education.          Family History  Problem Relation Age of Onset  . Hypertension Mother   . Kidney disease Brother     Past Medical History  Diagnosis Date  . Shortness of breath with exertion  . Chronic kidney disease     not on dialysis yet  . Coronary artery disease 05/14/2010    stress test - no scintigraphic evidence of inducible myocardial ischemia,; normal study  . Hypertension   . Arthritis   . Blood dyscrasia     one time had low platlet count  . Dysrhythmia     atrial fib  . SSS (sick sinus syndrome) 08/27/2011    echo EF >55%, severe LAE, moderate RAE, tissue AVR w/ gradients 35 and  . Pericardial effusion 03/12/2011    echo EF 55-60%  . Claudication 02/19/2012    LE  doppler no evidence of arterial insufficiency, lower extremities demonstrate normal values  w/ no evidence of insufficiency  . Lupus nephritis   . Pacemaker 09/19/2004    implanted  . Restless leg syndrome   . Cellulitis   . Anemia   . Benign prostatic hypertrophy   . Constipation   . Shoulder joint pain     Past Surgical History  Procedure Laterality Date  . Back surgery    . Appendectomy    . Hernia repair    . Pacemaker insertion  2006    Medtronic EnRhythm  . Av fistula placement  02/24/2011  . Aortic valve replacement  02/24/2011    pericardial tissue valve [facility]  . Coronary artery bypass graft  02/24/2011    LIMA to LAD, SVG to distal RCA  . Cardiac catheterization  02/13/2011    mod/severe aortic valve stenosis w peak  to peak gradient 25-32 mmHg,  70% stenosis LAD, 30-40%proximal followed by 90% sstenosis in RCA prior to anterior RV margin branch  . Lumbar laminectomy/decompression microdiscectomy Right 04/16/2012    Procedure: DECOMPRESSIVE L4 - L5/ MICRODISCECTOMY ON THE RIGHT 1 LEVEL;  Surgeon: Jacki Conesonald A Gioffre, MD;  Location: [facility];  Service: Orthopedics;  Laterality: Right;  . Esophagogastroduodenoscopy N/A 06/21/2013    Procedure: ESOPHAGOGASTRODUODENOSCOPY (EGD);  Surgeon: Theda BelfastPatrick D Hung, MD;  [facility];  Service: Endoscopy;  Laterality: N/A;  . Givens capsule study N/A 06/21/2013    Procedure: GIVENS CAPSULE STUDY;  Surgeon: Theda BelfastPatrick D Hung, MD;  [facility];  Service: Endoscopy;  Laterality: N/A;    Current Outpatient Prescriptions  Medication Sig Dispense Refill  . amiodarone (PACERONE) 200 MG tablet Take 200 mg by mouth daily.      Marland Kitchen. aspirin EC 81 MG tablet Take 81 mg by mouth daily.      . calcitRIOL (ROCALTROL) 0.25 MCG capsule Take 0.25 mcg by mouth daily before breakfast.       . Cyanocobalamin (VITAMIN B 12 PO) Take 1 tablet by mouth daily.      . cycloSPORINE (RESTASIS) 0.05 % ophthalmic emulsion Place 1 drop into both eyes 2 (two) times daily.       . ferrous sulfate 325 (65 FE) MG tablet Take 325 mg by mouth daily with breakfast.      . finasteride (PROSCAR) 5 MG tablet Take 5 mg by mouth daily.      . fish oil-omega-3 fatty acids 1000 MG capsule Take 1 g by mouth daily.       . furosemide (LASIX) 40 MG tablet Take 160 mg by mouth 2 (two) times daily. Takes 4 tablets      . metoprolol (LOPRESSOR) 50 MG tablet Take 50 mg by mouth daily before breakfast.       . Multiple Vitamins-Minerals (OCUVITE PRESERVISION PO) Take 1 tablet by mouth daily.      . pantoprazole (PROTONIX) 40 MG tablet Take 1 tablet (40 mg total) by mouth 2 (two) times daily.  60 tablet  0  . polyethylene glycol (MIRALAX / GLYCOLAX) packet Take 17 g by mouth daily as needed for mild constipation.         . pregabalin (LYRICA) 75 MG capsule Take 75 mg by mouth daily.       Marland Kitchen. rOPINIRole (REQUIP) 1 MG tablet Take 2 mg by mouth at bedtime.       Marland Kitchen. warfarin (COUMADIN) 2 MG tablet Take 1 tablet (2 mg total) by mouth daily.  15 tablet  0  . [DISCONTINUED] polysaccharide iron (NIFEREX) 150 MG CAPS capsule Take 1 capsule (150 mg total) by mouth daily. For one month then stop.  30 each  0   No current facility-administered medications for this visit.    Allergies as of 07/05/2013 - Review Complete 07/05/2013  Allergen Reaction Noted  . Statins  02/22/2013  . Aspirin Hives 01/31/2011  . Feraheme [ferumoxytol] Rash and Other (See Comments) 04/04/2011  . Penicillins Hives 01/31/2011    Vitals: BP 85/50  Pulse 90  Ht 5\' 7"  (1.702 m)  Wt 165 lb (74.844 kg)  BMI 25.84 kg/m2 Last Weight:  Wt Readings from Last 1 Encounters:  07/05/13 165 lb (74.844 kg)   Last Height:   Ht Readings from Last 1 Encounters:  07/05/13 5\' 7"  (1.702 m)     Physical exam: Exam: Gen: NAD, conversant Eyes: anicteric sclerae, moist conjunctivae HENT: Atraumatic, oropharynx clear Neck: Trachea midline; supple,  Lungs: CTA, no wheezing, rales, rhonic                          CV: RRR, no MRG Abdomen: Soft, non-tender;  Extremities: 1+ pitting edema bilateral LE Skin: Normal temperature, no rash,  Psych: Appropriate affect, pleasant  Neuro: MS: AA&Ox3, appropriately interactive, normal affect   Speech: fluent w/o paraphasic error  Memory: good recent and remote recall  CN: PERRL, EOMI no nystagmus, no ptosis, sensation intact to LT V1-V3 bilat, face symmetric, no weakness, hearing grossly intact, palate elevates symmetrically, shoulder shrug 5/5 bilat,  tongue protrudes midline, no fasiculations noted.  Motor: normal bulk and tone Strength: 5/5  In all extremities  Coord: rapid alternating and point-to-point (FNF, HTS) movements intact. Mild hand postural and intention tremor. No resting tremor  noted  Reflexes: symmetrical, bilat downgoing toes  Sens:decreased LT, PP, temp, vibration bilat LE to mid shin  Gait: posture, stance, stride and arm-swing normal. Tandem gait intact. Able to walk on heels and toes. Romberg absent.  Assessment:  After physical and neurologic examination, review of laboratory studies, imaging, neurophysiology testing and pre-existing records, assessment will be reviewed on the problem list.  Plan:  Treatment plan and additional workup will be reviewed under Problem List.  1)Peripheral neuropathy 2)RLS  80y/o gentleman sent for follow up evaluation of restless leg syndrome and peripheral neuropathy. Currently taking Requip 1 mg once a day and  Gabapentin 100 mg nightly. Suspect current concerns are more related to the peripheral neuropathy and not the RLS. Will check EMG/NCS. Due to poor renal function his treatment options are limited. Will continue on Gabapentin and requip for now. Follow up in 6 months.

## 2013-07-06 ENCOUNTER — Ambulatory Visit (INDEPENDENT_AMBULATORY_CARE_PROVIDER_SITE_OTHER): Payer: Medicare Other | Admitting: Pharmacist Clinician (PhC)/ Clinical Pharmacy Specialist

## 2013-07-06 ENCOUNTER — Ambulatory Visit: Payer: Medicare Other | Admitting: Pharmacist Clinician (PhC)/ Clinical Pharmacy Specialist

## 2013-07-06 DIAGNOSIS — I480 Paroxysmal atrial fibrillation: Secondary | ICD-10-CM

## 2013-07-06 DIAGNOSIS — I4891 Unspecified atrial fibrillation: Secondary | ICD-10-CM

## 2013-07-06 DIAGNOSIS — Z7901 Long term (current) use of anticoagulants: Secondary | ICD-10-CM

## 2013-07-06 LAB — POCT INR: INR: 1.2

## 2013-07-11 ENCOUNTER — Ambulatory Visit: Payer: Medicare Other | Admitting: *Deleted

## 2013-07-11 DIAGNOSIS — IMO0001 Reserved for inherently not codable concepts without codable children: Secondary | ICD-10-CM | POA: Diagnosis not present

## 2013-07-13 ENCOUNTER — Ambulatory Visit (INDEPENDENT_AMBULATORY_CARE_PROVIDER_SITE_OTHER): Payer: Medicare Other | Admitting: Pharmacist Clinician (PhC)/ Clinical Pharmacy Specialist

## 2013-07-13 DIAGNOSIS — Z7901 Long term (current) use of anticoagulants: Secondary | ICD-10-CM

## 2013-07-13 DIAGNOSIS — I4891 Unspecified atrial fibrillation: Secondary | ICD-10-CM

## 2013-07-13 DIAGNOSIS — I480 Paroxysmal atrial fibrillation: Secondary | ICD-10-CM

## 2013-07-13 LAB — POCT INR: INR: 2.9

## 2013-07-14 ENCOUNTER — Ambulatory Visit: Payer: Medicare Other | Admitting: Physical Therapy

## 2013-07-14 DIAGNOSIS — IMO0001 Reserved for inherently not codable concepts without codable children: Secondary | ICD-10-CM | POA: Diagnosis not present

## 2013-07-18 ENCOUNTER — Ambulatory Visit: Payer: Medicare Other | Admitting: Physical Therapy

## 2013-07-18 DIAGNOSIS — IMO0001 Reserved for inherently not codable concepts without codable children: Secondary | ICD-10-CM | POA: Diagnosis not present

## 2013-07-21 ENCOUNTER — Ambulatory Visit: Payer: Medicare Other | Attending: Family Medicine | Admitting: *Deleted

## 2013-07-21 DIAGNOSIS — IMO0001 Reserved for inherently not codable concepts without codable children: Secondary | ICD-10-CM | POA: Diagnosis present

## 2013-07-21 DIAGNOSIS — R5381 Other malaise: Secondary | ICD-10-CM | POA: Diagnosis not present

## 2013-07-21 DIAGNOSIS — M545 Low back pain, unspecified: Secondary | ICD-10-CM | POA: Insufficient documentation

## 2013-07-25 ENCOUNTER — Ambulatory Visit: Payer: Medicare Other | Admitting: *Deleted

## 2013-07-25 DIAGNOSIS — IMO0001 Reserved for inherently not codable concepts without codable children: Secondary | ICD-10-CM | POA: Diagnosis not present

## 2013-07-27 ENCOUNTER — Ambulatory Visit: Payer: Medicare Other | Admitting: *Deleted

## 2013-07-27 ENCOUNTER — Other Ambulatory Visit (HOSPITAL_COMMUNITY): Payer: Self-pay | Admitting: *Deleted

## 2013-07-27 ENCOUNTER — Ambulatory Visit (INDEPENDENT_AMBULATORY_CARE_PROVIDER_SITE_OTHER): Payer: Medicare Other | Admitting: Pharmacist

## 2013-07-27 DIAGNOSIS — I480 Paroxysmal atrial fibrillation: Secondary | ICD-10-CM

## 2013-07-27 DIAGNOSIS — I4891 Unspecified atrial fibrillation: Secondary | ICD-10-CM

## 2013-07-27 DIAGNOSIS — IMO0001 Reserved for inherently not codable concepts without codable children: Secondary | ICD-10-CM | POA: Diagnosis not present

## 2013-07-27 DIAGNOSIS — Z7901 Long term (current) use of anticoagulants: Secondary | ICD-10-CM

## 2013-07-27 LAB — POCT INR: INR: 3

## 2013-07-28 ENCOUNTER — Encounter (HOSPITAL_COMMUNITY)
Admission: RE | Admit: 2013-07-28 | Discharge: 2013-07-28 | Disposition: A | Discharge status: Home / Self Care | Payer: Medicare Other | Source: Ambulatory Visit | Attending: Nephrology | Admitting: Nephrology

## 2013-07-28 DIAGNOSIS — D509 Iron deficiency anemia, unspecified: Secondary | ICD-10-CM | POA: Diagnosis present

## 2013-07-28 DIAGNOSIS — D638 Anemia in other chronic diseases classified elsewhere: Secondary | ICD-10-CM | POA: Insufficient documentation

## 2013-07-28 DIAGNOSIS — N184 Chronic kidney disease, stage 4 (severe): Secondary | ICD-10-CM | POA: Insufficient documentation

## 2013-07-28 MED ORDER — SODIUM CHLORIDE 0.9 % IV SOLN
200.0000 mg | Freq: Once | INTRAVENOUS | Status: AC
  Administered 2013-07-28: 200 mg via INTRAVENOUS
  Filled 2013-07-28: qty 10

## 2013-07-28 MED ORDER — DARBEPOETIN ALFA-POLYSORBATE 100 MCG/0.5ML IJ SOLN
INTRAMUSCULAR | Status: AC
  Filled 2013-07-28: qty 0.5

## 2013-07-28 MED ORDER — DARBEPOETIN ALFA-POLYSORBATE 100 MCG/0.5ML IJ SOLN
100.0000 ug | Freq: Once | INTRAMUSCULAR | Status: AC
  Administered 2013-07-28: 100 ug via SUBCUTANEOUS

## 2013-08-02 ENCOUNTER — Ambulatory Visit: Payer: Medicare Other | Admitting: *Deleted

## 2013-08-02 DIAGNOSIS — IMO0001 Reserved for inherently not codable concepts without codable children: Secondary | ICD-10-CM | POA: Diagnosis not present

## 2013-08-04 ENCOUNTER — Encounter (HOSPITAL_COMMUNITY)
Admission: RE | Admit: 2013-08-04 | Discharge: 2013-08-04 | Disposition: A | Discharge status: Home / Self Care | Payer: Medicare Other | Source: Ambulatory Visit | Attending: Nephrology | Admitting: Nephrology

## 2013-08-04 ENCOUNTER — Ambulatory Visit (INDEPENDENT_AMBULATORY_CARE_PROVIDER_SITE_OTHER): Payer: Medicare Other | Admitting: Neurology

## 2013-08-04 ENCOUNTER — Encounter (INDEPENDENT_AMBULATORY_CARE_PROVIDER_SITE_OTHER): Payer: Self-pay

## 2013-08-04 DIAGNOSIS — G2581 Restless legs syndrome: Secondary | ICD-10-CM

## 2013-08-04 DIAGNOSIS — G609 Hereditary and idiopathic neuropathy, unspecified: Secondary | ICD-10-CM

## 2013-08-04 DIAGNOSIS — D509 Iron deficiency anemia, unspecified: Secondary | ICD-10-CM | POA: Diagnosis not present

## 2013-08-04 DIAGNOSIS — Z0289 Encounter for other administrative examinations: Secondary | ICD-10-CM

## 2013-08-04 MED ORDER — SODIUM CHLORIDE 0.9 % IV SOLN
200.0000 mg | Freq: Once | INTRAVENOUS | Status: AC
  Administered 2013-08-04: 200 mg via INTRAVENOUS
  Filled 2013-08-04: qty 10

## 2013-08-04 NOTE — Procedures (Signed)
   NCS (NERVE CONDUCTION STUDY) WITH EMG (ELECTROMYOGRAPHY) REPORT   STUDY DATE: July 16th 2015 PATIENT NAME: Nathaniel Steele DOB: 11/01/1926 MRN: [id number]    TECHNOLOGIST: Gearldine ShownLorraine Jones ELECTROMYOGRAPHER: Levert FeinsteinYan, Aylla Huffine M.D.  CLINICAL INFORMATION:  80 years old male, with past medical history of low back pain, lumbar decompression surgery in the past, now complains of bilateral feet paresthesia, especially at nighttime  On examination: He has mild bilateral toe extension, toe flexion weakness, Length dependent sensory changes, absent ankle reflexes   FINDINGS: NERVE CONDUCTION STUDY: Bilateral peroneal sensory responses were absent.  Bilateral peroneal and tibial motor responses showed moderately to severely decreased C. map amplitude, with normal distal latency, low normal conduction velocity.  Bilateral tibial H. reflexes were absent.  Right median, ulnar sensory and motor responses were normal  NEEDLE ELECTROMYOGRAPHY: Selected needle examination was performed at bilateral lower extremity muscles, I did not perform needle examination of lumbar paraspinals, because he is taking Coumadin.  Bilateral tibialis anterior, tibialis posterior: Increased insertion activity, 1 plus spontaneous activity, enlarged complex motor unit potential, with decreased recruitment patterns.   Bilateral medial gastrocnemius, vastus lateralis:  Increased insertion activity, no spontaneous activity, enlarged complex motor unit potential, with decreased recruitment patterns.  Right biceps femoris short head, left biceps femoris long head: Increased insertion activity, 1 plus spontaneous activity, enlarged complex motor unit potential, with decreased recruitment patterns  IMPRESSION:  This is a marked abnormal study.  There is electrodiagnostic evidence of length dependent axonal peripheral neuropathy.  There was also evidence of chronic bilateral lumbar radiculopathies.   INTERPRETING PHYSICIAN:   Levert FeinsteinYan,  Janah Mcculloh M.D. Ph.D. [facility] [address], [address] [place], [place] [address] [phone]

## 2013-08-05 ENCOUNTER — Ambulatory Visit: Payer: Medicare Other | Admitting: *Deleted

## 2013-08-05 DIAGNOSIS — IMO0001 Reserved for inherently not codable concepts without codable children: Secondary | ICD-10-CM | POA: Diagnosis not present

## 2013-08-10 ENCOUNTER — Telehealth: Payer: Self-pay | Admitting: Cardiovascular Disease

## 2013-08-10 ENCOUNTER — Other Ambulatory Visit: Payer: Self-pay | Admitting: Cardiovascular Disease

## 2013-08-10 LAB — COMPREHENSIVE METABOLIC PANEL
ALBUMIN: 4.1 g/dL (ref 3.5–5.2)
ALT: 12 U/L (ref 0–53)
AST: 31 U/L (ref 0–37)
Alkaline Phosphatase: 55 U/L (ref 39–117)
BUN: 46 mg/dL — AB (ref 6–23)
CALCIUM: 9.4 mg/dL (ref 8.4–10.5)
CHLORIDE: 101 meq/L (ref 96–112)
CO2: 23 meq/L (ref 19–32)
Creat: 2.88 mg/dL — ABNORMAL HIGH (ref 0.50–1.35)
Glucose, Bld: 79 mg/dL (ref 70–99)
POTASSIUM: 4.6 meq/L (ref 3.5–5.3)
Sodium: 137 mEq/L (ref 135–145)
TOTAL PROTEIN: 6.7 g/dL (ref 6.0–8.3)
Total Bilirubin: 0.7 mg/dL (ref 0.2–1.2)

## 2013-08-10 NOTE — Telephone Encounter (Signed)
Nathaniel Steele [facility] and will be getting those results to you soon

## 2013-08-16 ENCOUNTER — Telehealth: Payer: Self-pay | Admitting: *Deleted

## 2013-08-16 NOTE — Telephone Encounter (Signed)
Message copied by Tobin ChadMARTIN, SHARON V. on Tue Aug 16, 2013 11:58 AM ------      Message from: Thurmon FairROITORU, MIHAI      Created: Tue Aug 16, 2013  8:30 AM       Labs unchanged - moderate kidney dysfunction, potasium OK ------

## 2013-08-16 NOTE — Telephone Encounter (Signed)
Spoke to patient. Result given . Verbalized understanding  

## 2013-08-23 ENCOUNTER — Ambulatory Visit (INDEPENDENT_AMBULATORY_CARE_PROVIDER_SITE_OTHER): Payer: Medicare Other | Admitting: Cardiovascular Disease

## 2013-08-23 ENCOUNTER — Ambulatory Visit (INDEPENDENT_AMBULATORY_CARE_PROVIDER_SITE_OTHER): Payer: Medicare Other | Admitting: *Deleted

## 2013-08-23 ENCOUNTER — Encounter: Payer: Self-pay | Admitting: Cardiovascular Disease

## 2013-08-23 VITALS — BP 110/64 | HR 72 | Resp 16 | Ht 67.0 in | Wt 171.5 lb

## 2013-08-23 DIAGNOSIS — Z951 Presence of aortocoronary bypass graft: Secondary | ICD-10-CM

## 2013-08-23 DIAGNOSIS — Z950 Presence of cardiac pacemaker: Secondary | ICD-10-CM

## 2013-08-23 DIAGNOSIS — I4891 Unspecified atrial fibrillation: Secondary | ICD-10-CM

## 2013-08-23 DIAGNOSIS — Z5181 Encounter for therapeutic drug level monitoring: Secondary | ICD-10-CM

## 2013-08-23 DIAGNOSIS — I480 Paroxysmal atrial fibrillation: Secondary | ICD-10-CM

## 2013-08-23 DIAGNOSIS — Z952 Presence of prosthetic heart valve: Secondary | ICD-10-CM

## 2013-08-23 DIAGNOSIS — Z954 Presence of other heart-valve replacement: Secondary | ICD-10-CM

## 2013-08-23 DIAGNOSIS — Z7901 Long term (current) use of anticoagulants: Secondary | ICD-10-CM

## 2013-08-23 DIAGNOSIS — Z79899 Other long term (current) drug therapy: Secondary | ICD-10-CM

## 2013-08-23 DIAGNOSIS — R001 Bradycardia, unspecified: Secondary | ICD-10-CM

## 2013-08-23 DIAGNOSIS — I498 Other specified cardiac arrhythmias: Secondary | ICD-10-CM

## 2013-08-23 LAB — MDC_IDC_ENUM_SESS_TYPE_INCLINIC
Battery Impedance: 100 Ohm
Battery Voltage: 2.8 V
Brady Statistic AP VS Percent: 0 %
Brady Statistic AS VP Percent: 8 %
Brady Statistic AS VS Percent: 0 %
Date Time Interrogation Session: 20150804121039
Lead Channel Impedance Value: 387 Ohm
Lead Channel Impedance Value: 537 Ohm
Lead Channel Pacing Threshold Amplitude: 1.25 V
Lead Channel Pacing Threshold Amplitude: 1.25 V
Lead Channel Pacing Threshold Pulse Width: 0.4 ms
Lead Channel Setting Pacing Amplitude: 2.75 V
MDC IDC MSMT BATTERY REMAINING LONGEVITY: 117 mo
MDC IDC MSMT LEADCHNL RA SENSING INTR AMPL: 0.35 mV
MDC IDC MSMT LEADCHNL RV PACING THRESHOLD PULSEWIDTH: 0.4 ms
MDC IDC MSMT LEADCHNL RV SENSING INTR AMPL: 11.2 mV
MDC IDC SET LEADCHNL RA PACING AMPLITUDE: 2 V
MDC IDC SET LEADCHNL RV PACING PULSEWIDTH: 0.4 ms
MDC IDC SET LEADCHNL RV SENSING SENSITIVITY: 2.8 mV
MDC IDC STAT BRADY AP VP PERCENT: 92 %

## 2013-08-23 LAB — POCT INR: INR: 3.4

## 2013-08-23 NOTE — Patient Instructions (Addendum)
Your physician recommends that you return for lab work WU:[id number]:THREE MONTHS  Your physician recommends that you schedule a follow-up appointment with Pacemaker Check  in: three Months with Dr.Croitoru

## 2013-08-23 NOTE — Progress Notes (Signed)
Patient ID: Nathaniel Steele, male   DOB: 10-22-1926, 80 y.o.   MRN: [id number]     Reason for office visit Pacemaker check, CAD status post CABG  Nathaniel Steele is here for a routine pacemaker check. His cardiovascular history is very complex. No major acute problems appear to be occurring today. His rhythm is atrial flutter with 100% ventricular pacing. He appears to be completely oblivious to the arrhythmia. He has been fully therapeutically anticoagulated over the last month and we try to overdrive pace the atrial flutter, unsuccessfully.  Interrogation of the device shows an overall burden of atrial tachyarrhythmia of about 19%, comparable to recent historical trend. There are no episodes of high ventricular rate. The longest single episode of atrial fibrillation lasted for about 9 hours. There is 92% atrial pacing, 100% ventricular pacing  His pacemaker was initially implanted in 2006 for sinus node dysfunction with symptomatic bradycardia and he underwent a generator change out in April of 2015. Although not technically pacemaker dependent his underlying rhythm is extremely slow sinus bradycardia at about 30 beats per minute. He also has a very long AV conduction time at almost 700 ms so that he paces the ventricle 100% of the time. We attempted to discontinue amiodarone to see if this would reduce the frequency of ventricular pacing, unsuccessfully. Amiodarone was restarted 2 to increased burden of atrial fibrillation.  He has a past history of coronary disease and previous two-vessel bypass surgery, aortic valve replacement with a biological prosthesis in 2013, recurrent paroxysmal atrial tachycardia and fibrillation on chronic warfarin anticoagulation. He has hyperlipidemia and hypertension.  [facility] was hospitalized in April 2015 with acute anemia related to spontaneous bleeding in his right lower extremity and again hospitalized in June 2015 with anemia, presumably related to gastrointestinal bleeding.  Stools were Hemoccult positive but upper endoscopy, capsule enteroscopy were not revealing. He had a remote normal colonoscopy except for some diverticulosis. He required a total of 2 units of PRBC transfusion in April and 6 units of transfusion in June. On both occasions his warfarin anticoagulation was only slightly above target range. As far as I know he has never had a stroke/TIA or other embolic events.  He gets many of his medications from the [facility] hospital and prefers to have the labs drawn there.  He has stage IV chronic kidney disease and already has a well-developed, mature a fistula in his left forearm. He has not yet started dialysis. His kidney disease is felt to be secondary to lupus nephritis, with a collagen vascular disease in remission for several years. He has a history of neuropathy and restless leg syndrome and had frostbite while in [place] during the war. He has had surgery twice for lumbar spine disease.    Allergies  Allergen Reactions  . Statins     myalgias  . Aspirin Hives    Patient states that he can take the enteric coated but not the uncoated  . Feraheme [Ferumoxytol] Rash and Other (See Comments)    Abdominal pain  . Penicillins Hives    Current Outpatient Prescriptions  Medication Sig Dispense Refill  . amiodarone (PACERONE) 200 MG tablet Take 200 mg by mouth daily.      Marland Kitchen aspirin EC 81 MG tablet Take 81 mg by mouth daily.      . calcitRIOL (ROCALTROL) 0.25 MCG capsule Take 0.25 mcg by mouth daily before breakfast.       . calcium acetate (PHOSLO) 667 MG capsule Take 667 mg by mouth 3 (  three) times daily with meals.      . Cyanocobalamin (VITAMIN B 12 PO) Take 1 tablet by mouth daily.      . cycloSPORINE (RESTASIS) 0.05 % ophthalmic emulsion Place 1 drop into both eyes 2 (two) times daily.       . ferrous sulfate 325 (65 FE) MG tablet Take 325 mg by mouth daily with breakfast.      . finasteride (PROSCAR) 5 MG tablet Take 5 mg by mouth daily.      . fish  oil-omega-3 fatty acids 1000 MG capsule Take 1 g by mouth daily.       . furosemide (LASIX) 40 MG tablet Take 160 mg by mouth 2 (two) times daily. Takes 4 tablets      . metoprolol (LOPRESSOR) 50 MG tablet Take 50 mg by mouth daily before breakfast.       . Multiple Vitamins-Minerals (OCUVITE PRESERVISION PO) Take 1 tablet by mouth daily.      . pantoprazole (PROTONIX) 40 MG tablet Take 1 tablet (40 mg total) by mouth 2 (two) times daily.  60 tablet  0  . polyethylene glycol (MIRALAX / GLYCOLAX) packet Take 17 g by mouth daily as needed for mild constipation.       . pregabalin (LYRICA) 75 MG capsule Take 75 mg by mouth daily.       Marland Kitchen rOPINIRole (REQUIP) 1 MG tablet Take 2 mg by mouth at bedtime.       Marland Kitchen warfarin (COUMADIN) 5 MG tablet Take 5 mg by mouth daily.      . [DISCONTINUED] polysaccharide iron (NIFEREX) 150 MG CAPS capsule Take 1 capsule (150 mg total) by mouth daily. For one month then stop.  30 each  0   No current facility-administered medications for this visit.    Past Medical History  Diagnosis Date  . Shortness of breath with exertion  . Chronic kidney disease     not on dialysis yet  . Coronary artery disease 05/14/2010    stress test - no scintigraphic evidence of inducible myocardial ischemia,; normal study  . Hypertension   . Arthritis   . Blood dyscrasia     one time had low platlet count  . Dysrhythmia     atrial fib  . SSS (sick sinus syndrome) 08/27/2011    echo EF >55%, severe LAE, moderate RAE, tissue AVR w/ gradients 35 and  . Pericardial effusion 03/12/2011    echo EF 55-60%  . Claudication 02/19/2012    LE doppler no evidence of arterial insufficiency, lower extremities demonstrate normal values  w/ no evidence of insufficiency  . Lupus nephritis   . Pacemaker 09/19/2004    implanted  . Restless leg syndrome   . Cellulitis   . Anemia   . Benign prostatic hypertrophy   . Constipation   . Shoulder joint pain     Past Surgical History    Procedure Laterality Date  . Back surgery    . Appendectomy    . Hernia repair    . Pacemaker insertion  2006    Medtronic EnRhythm  . Av fistula placement  02/24/2011  . Aortic valve replacement  02/24/2011    pericardial tissue valve [facility] Ease  . Coronary artery bypass graft  02/24/2011    LIMA to LAD, SVG to distal RCA  . Cardiac catheterization  02/13/2011    mod/severe aortic valve stenosis w peak to peak gradient 25-32 mmHg,  70% stenosis LAD, 30-40%proximal followed by  90% sstenosis in RCA prior to anterior RV margin branch  . Lumbar laminectomy/decompression microdiscectomy Right 04/16/2012    Procedure: DECOMPRESSIVE L4 - L5/ MICRODISCECTOMY ON THE RIGHT 1 LEVEL;  Surgeon: Jacki Cones, MD;  Location: [facility] ORS;  Service: Orthopedics;  Laterality: Right;  . Esophagogastroduodenoscopy N/A 06/21/2013    Procedure: ESOPHAGOGASTRODUODENOSCOPY (EGD);  Surgeon: Theda Belfast, MD;  Location: [facility] ENDOSCOPY;  Service: Endoscopy;  Laterality: N/A;  . Givens capsule study N/A 06/21/2013    Procedure: GIVENS CAPSULE STUDY;  Surgeon: Theda Belfast, MD;  Location: [facility] ENDOSCOPY;  Service: Endoscopy;  Laterality: N/A;    Family History  Problem Relation Age of Onset  . Hypertension Mother   . Kidney disease Brother     History   Social History  . Marital Status: Married    Spouse Name: [name]    Number of Children: 1  . Years of Education: 6   Occupational History  . Not on file.   Social History Main Topics  . Smoking status: Former Smoker    Types: Cigarettes    Quit date: 02/11/1957  . Smokeless tobacco: Never Used  . Alcohol Use: No  . Drug Use: No  . Sexual Activity: Not on file   Other Topics Concern  . Not on file   Social History Narrative   Patient is married ([name]).   Patient has one child.   Patient does not drink caffeine.   Patient is right-handed.   Patient is retired.   Patient has a 6th grade education.          Review of systems: The patient  specifically denies any chest pain at rest or with exertion, dyspnea at rest or with exertion, orthopnea, paroxysmal nocturnal dyspnea, syncope, palpitations, focal neurological deficits, intermittent claudication, lower extremity edema, unexplained weight gain, cough, hemoptysis or wheezing.  The patient also denies abdominal pain, nausea, vomiting, dysphagia, diarrhea, constipation, polyuria, polydipsia, dysuria, hematuria, frequency, urgency, abnormal bleeding or bruising, fever, chills, unexpected weight changes, mood swings, change in skin or hair texture, change in voice quality, auditory or visual problems, allergic reactions or rashes, new musculoskeletal complaints other than usual "aches and pains".    PHYSICAL EXAM BP 110/64  Pulse 72  Resp 16  Ht 5\' 7"  (1.702 m)  Wt 171 lb 8 oz (77.792 kg)  BMI 26.85 kg/m2 General: Alert, oriented x3, no distress  Head: no evidence of trauma, PERRL, EOMI, no exophtalmos or lid lag, no myxedema, no xanthelasma; normal ears, nose and oropharynx  Neck: normal jugular venous pulsations and no hepatojugular reflux; brisk carotid pulses without delay and faint bilateral carotid bruits  Chest: clear to auscultation, no signs of consolidation by percussion or palpation, normal fremitus, symmetrical and full respiratory excursions; healed sternotomy, healthy right subclavian pacemaker site  Cardiovascular: normal position and quality of the apical impulse, regular rhythm, normal first and paradoxically split second heart sounds, no rubs or gallops, early peaking grade 2/6 systolic ejection murmur, no diastolic murmur  Abdomen: no tenderness or distention, no masses by palpation, no abnormal pulsatility or arterial bruits, normal bowel sounds, no hepatosplenomegaly  Extremities: Large tortuous left forearm AV fistula with excellent thrill and bruit ; no clubbing, cyanosis or edema; 2+ radial, ulnar and brachial pulses bilaterally; 2+ right femoral, posterior  tibial and dorsalis pedis pulses; 2+ left femoral, posterior tibial and dorsalis pedis pulses; no subclavian or femoral bruits  Neurological: grossly nonfocal except mild resting tremor   EKG: Atrial fibrillation/flutter; 100% ventricular pacing  Lipid Panel     Component Value Date/Time   CHOL 190 07/08/2012 0955   TRIG 164* 07/08/2012 0955   HDL 28* 07/08/2012 0955   CHOLHDL 6.8 07/08/2012 0955   VLDL 33 07/08/2012 0955   LDLCALC 129* 07/08/2012 0955    BMET    Component Value Date/Time   NA 137 08/10/2013 1009   K 4.6 08/10/2013 1009   CL 101 08/10/2013 1009   CO2 23 08/10/2013 1009   GLUCOSE 79 08/10/2013 1009   BUN 46* 08/10/2013 1009   CREATININE 2.88* 08/10/2013 1009   CREATININE 2.34* 06/23/2013 0410   CALCIUM 9.4 08/10/2013 1009   GFRNONAA 24* 06/23/2013 0410   GFRAA 28* 06/23/2013 0410     ASSESSMENT AND PLAN Pacemaker, MDT implanted 08/06, generator change April 2015 Normal device function today. Attempts at overdrive pacing of atrial flutter were unsuccessful. No permanent reprogramming changes made. [facility] remote monitoring was offered, but he prefers of his visits every 3 months.  PAF (paroxysmal atrial fibrillation) Despite restarting the amiodarone, he maintains an overall burden of atrial tachyarrhythmia of roughly 18%. Today the rhythm appeared very organized, consistent with atrial flutter with a cycle length of around 300 ms. Attempts at overdrive pacing with bursts with cycle lengths starting at 260 ms and progressing all the way down 260 ms were all unsuccessful. There was never convincing evidence of entrainment of the arrhythmia. He may have a left atrial flutter. Continue anti-coagulation. Warfarin dose was slightly reduced today, INR was 3.2. Should target keeping the INR on the lower range (2.0-2.5) due to his history of bleeding problems.  S/P CABG x 2: (LIMA-LAD  SVG - RCA) No symptoms of active coronary insufficiency.  AVR 23 mm bioprosthesis  Normal function  by most recent echocardiogram less than one year ago (peak gradient 26, mean gradient 15)   Orders Placed This Encounter  Procedures  . Basic Metabolic Panel (BMET)  . TSH  . Implantable device check  . EKG 12-Lead   Meds ordered this encounter  Medications  . warfarin (COUMADIN) 5 MG tablet    Sig: Take 5 mg by mouth daily.    Junious SilkROITORU,Caylah Plouff  Thelia Tanksley, MD, [facility] [phone] office [phone] pager

## 2013-08-23 NOTE — Assessment & Plan Note (Signed)
Normal function by most recent echocardiogram less than one year ago (peak gradient 26, mean gradient 15)

## 2013-08-23 NOTE — Assessment & Plan Note (Addendum)
Despite restarting the amiodarone, he maintains an overall burden of atrial tachyarrhythmia of roughly 18%. Today the rhythm appeared very organized, consistent with atrial flutter with a cycle length of around 300 ms. Attempts at overdrive pacing with bursts with cycle lengths starting at 260 ms and progressing all the way down 260 ms were all unsuccessful. There was never convincing evidence of entrainment of the arrhythmia. He may have a left atrial flutter. Continue anti-coagulation. Warfarin dose was slightly reduced today, INR was 3.2. Should target keeping the INR on the lower range (2.0-2.5) due to his history of bleeding problems.

## 2013-08-23 NOTE — Assessment & Plan Note (Signed)
No symptoms of active coronary insufficiency 

## 2013-08-23 NOTE — Assessment & Plan Note (Signed)
Normal device function today. Attempts at overdrive pacing of atrial flutter were unsuccessful. No permanent reprogramming changes made. [facility] remote monitoring was offered, but he prefers of his visits every 3 months.

## 2013-08-25 ENCOUNTER — Encounter: Payer: Medicare Other | Admitting: Cardiovascular Disease

## 2013-09-09 ENCOUNTER — Ambulatory Visit (INDEPENDENT_AMBULATORY_CARE_PROVIDER_SITE_OTHER): Payer: Medicare Other | Admitting: Pharmacist Clinician (PhC)/ Clinical Pharmacy Specialist

## 2013-09-09 DIAGNOSIS — I4891 Unspecified atrial fibrillation: Secondary | ICD-10-CM

## 2013-09-09 DIAGNOSIS — Z7901 Long term (current) use of anticoagulants: Secondary | ICD-10-CM

## 2013-09-09 DIAGNOSIS — I480 Paroxysmal atrial fibrillation: Secondary | ICD-10-CM

## 2013-09-09 LAB — POCT INR: INR: 3.7

## 2013-09-23 ENCOUNTER — Ambulatory Visit (INDEPENDENT_AMBULATORY_CARE_PROVIDER_SITE_OTHER): Payer: Medicare Other | Admitting: Pharmacist Clinician (PhC)/ Clinical Pharmacy Specialist

## 2013-09-23 DIAGNOSIS — I4891 Unspecified atrial fibrillation: Secondary | ICD-10-CM

## 2013-09-23 DIAGNOSIS — I480 Paroxysmal atrial fibrillation: Secondary | ICD-10-CM

## 2013-09-23 DIAGNOSIS — Z7901 Long term (current) use of anticoagulants: Secondary | ICD-10-CM

## 2013-09-23 LAB — POCT INR: INR: 2.3

## 2013-10-03 ENCOUNTER — Encounter: Payer: Self-pay | Admitting: Cardiovascular Disease

## 2013-10-21 ENCOUNTER — Ambulatory Visit (INDEPENDENT_AMBULATORY_CARE_PROVIDER_SITE_OTHER): Payer: Medicare Other | Admitting: Pharmacist Clinician (PhC)/ Clinical Pharmacy Specialist

## 2013-10-21 DIAGNOSIS — Z7901 Long term (current) use of anticoagulants: Secondary | ICD-10-CM

## 2013-10-21 DIAGNOSIS — I480 Paroxysmal atrial fibrillation: Secondary | ICD-10-CM

## 2013-10-21 LAB — POCT INR: INR: 2.1

## 2013-11-18 ENCOUNTER — Ambulatory Visit (INDEPENDENT_AMBULATORY_CARE_PROVIDER_SITE_OTHER): Payer: Medicare Other | Admitting: Pharmacist Clinician (PhC)/ Clinical Pharmacy Specialist

## 2013-11-18 DIAGNOSIS — Z7901 Long term (current) use of anticoagulants: Secondary | ICD-10-CM

## 2013-11-18 DIAGNOSIS — I480 Paroxysmal atrial fibrillation: Secondary | ICD-10-CM

## 2013-11-18 LAB — POCT INR: INR: 3

## 2013-11-18 MED ORDER — WARFARIN SODIUM 5 MG PO TABS: 5.0000 mg | ORAL_TABLET | Freq: Every day | ORAL | Status: DC

## 2013-12-06 ENCOUNTER — Encounter: Payer: Self-pay | Admitting: Cardiovascular Disease

## 2013-12-06 ENCOUNTER — Ambulatory Visit (INDEPENDENT_AMBULATORY_CARE_PROVIDER_SITE_OTHER): Payer: Medicare Other | Admitting: Cardiovascular Disease

## 2013-12-06 ENCOUNTER — Ambulatory Visit (INDEPENDENT_AMBULATORY_CARE_PROVIDER_SITE_OTHER): Payer: Medicare Other | Admitting: *Deleted

## 2013-12-06 VITALS — BP 142/70 | HR 72 | Resp 16 | Ht 67.0 in | Wt 178.6 lb

## 2013-12-06 DIAGNOSIS — I4819 Other persistent atrial fibrillation: Secondary | ICD-10-CM

## 2013-12-06 DIAGNOSIS — I5040 Unspecified combined systolic (congestive) and diastolic (congestive) heart failure: Secondary | ICD-10-CM

## 2013-12-06 DIAGNOSIS — Z79899 Other long term (current) drug therapy: Secondary | ICD-10-CM

## 2013-12-06 DIAGNOSIS — I495 Sick sinus syndrome: Secondary | ICD-10-CM

## 2013-12-06 DIAGNOSIS — I481 Persistent atrial fibrillation: Secondary | ICD-10-CM

## 2013-12-06 DIAGNOSIS — I480 Paroxysmal atrial fibrillation: Secondary | ICD-10-CM

## 2013-12-06 DIAGNOSIS — R001 Bradycardia, unspecified: Secondary | ICD-10-CM

## 2013-12-06 DIAGNOSIS — Z7901 Long term (current) use of anticoagulants: Secondary | ICD-10-CM

## 2013-12-06 DIAGNOSIS — I5033 Acute on chronic diastolic (congestive) heart failure: Secondary | ICD-10-CM

## 2013-12-06 DIAGNOSIS — R5383 Other fatigue: Secondary | ICD-10-CM

## 2013-12-06 LAB — MDC_IDC_ENUM_SESS_TYPE_INCLINIC
Battery Impedance: 100 Ohm
Brady Statistic AP VS Percent: 0 %
Brady Statistic AS VS Percent: 0 %
Date Time Interrogation Session: 20151117142833
Lead Channel Impedance Value: 382 Ohm
Lead Channel Impedance Value: 487 Ohm
Lead Channel Pacing Threshold Amplitude: 1.25 V
Lead Channel Pacing Threshold Pulse Width: 0.64 ms
Lead Channel Sensing Intrinsic Amplitude: 0.5 mV
Lead Channel Sensing Intrinsic Amplitude: 8 mV
Lead Channel Setting Pacing Amplitude: 2.5 V
Lead Channel Setting Pacing Pulse Width: 0.64 ms
MDC IDC MSMT BATTERY REMAINING LONGEVITY: 115 mo
MDC IDC MSMT BATTERY VOLTAGE: 2.79 V
MDC IDC SET LEADCHNL RA PACING AMPLITUDE: 2 V
MDC IDC SET LEADCHNL RV SENSING SENSITIVITY: 2.8 mV
MDC IDC STAT BRADY AP VP PERCENT: 39 %
MDC IDC STAT BRADY AS VP PERCENT: 61 %

## 2013-12-06 LAB — CBC
HEMATOCRIT: 27.5 % — AB (ref 39.0–52.0)
HEMOGLOBIN: 9.3 g/dL — AB (ref 13.0–17.0)
MCH: 28.4 pg (ref 26.0–34.0)
MCHC: 33.8 g/dL (ref 30.0–36.0)
MCV: 84.1 fL (ref 78.0–100.0)
MPV: 10.4 fL (ref 9.4–12.4)
Platelets: 111 10*3/uL — ABNORMAL LOW (ref 150–400)
RBC: 3.27 MIL/uL — ABNORMAL LOW (ref 4.22–5.81)
RDW: 16 % — ABNORMAL HIGH (ref 11.5–15.5)
WBC: 3.9 10*3/uL — ABNORMAL LOW (ref 4.0–10.5)

## 2013-12-06 LAB — POCT INR: INR: 3.3

## 2013-12-06 MED ORDER — METOLAZONE 2.5 MG PO TABS: 2.5000 mg | ORAL_TABLET | Freq: Every day | ORAL | Status: DC

## 2013-12-06 NOTE — Patient Instructions (Signed)
START Metolazone 2.5mg  for 3 days.  Take 30 minute before the Furosemide 3 days only, then as needed.  Your physician has requested that you have an echocardiogram. Echocardiography is a painless test that uses sound waves to create images of your heart. It provides your doctor with information about the size and shape of your heart and how well your heart's chambers and valves are working. This procedure takes approximately one hour. There are no restrictions for this procedure.  Your physician has recommended that you have a Cardioversion (DCCV). Electrical Cardioversion uses a jolt of electricity to your heart either through paddles or wired patches attached to your chest. This is a controlled, usually prescheduled, procedure. Defibrillation is done under light anesthesia in the hospital, and you usually go home the day of the procedure. This is done to get your heart back into a normal rhythm. You are not awake for the procedure. Please see the instruction sheet given to you today.  Your physician recommends that you return for lab work in: [facility].

## 2013-12-07 ENCOUNTER — Other Ambulatory Visit: Payer: Self-pay | Admitting: *Deleted

## 2013-12-07 ENCOUNTER — Encounter: Payer: Self-pay | Admitting: Cardiovascular Disease

## 2013-12-07 DIAGNOSIS — I5033 Acute on chronic diastolic (congestive) heart failure: Secondary | ICD-10-CM

## 2013-12-07 DIAGNOSIS — I480 Paroxysmal atrial fibrillation: Secondary | ICD-10-CM

## 2013-12-07 HISTORY — DX: Acute on chronic diastolic (congestive) heart failure: I50.33

## 2013-12-07 LAB — COMPREHENSIVE METABOLIC PANEL
ALT: 16 U/L (ref 0–53)
AST: 21 U/L (ref 0–37)
Albumin: 3.9 g/dL (ref 3.5–5.2)
Alkaline Phosphatase: 59 U/L (ref 39–117)
BUN: 65 mg/dL — ABNORMAL HIGH (ref 6–23)
CO2: 25 meq/L (ref 19–32)
Calcium: 8.8 mg/dL (ref 8.4–10.5)
Chloride: 105 mEq/L (ref 96–112)
Creat: 3.33 mg/dL — ABNORMAL HIGH (ref 0.50–1.35)
Glucose, Bld: 88 mg/dL (ref 70–99)
Potassium: 4.2 mEq/L (ref 3.5–5.3)
SODIUM: 141 meq/L (ref 135–145)
Total Bilirubin: 0.6 mg/dL (ref 0.2–1.2)
Total Protein: 6.4 g/dL (ref 6.0–8.3)

## 2013-12-07 LAB — APTT: APTT: 50 s — AB (ref 24–37)

## 2013-12-07 LAB — PROTIME-INR
INR: 3.02 — AB (ref <1.50)
PROTHROMBIN TIME: 31.3 s — AB (ref 11.6–15.2)

## 2013-12-07 NOTE — Progress Notes (Signed)
Patient ID: Nathaniel Steele, male   DOB: 09/20/1926, 80 y.o.   MRN: [id number]      Reason for office visit Pacemaker check, CAD status post CABG, persistent atrial flutter  Nathaniel Steele has worsened dyspnea and edema. He has gained 7 lb since August, 13 lb from his usual baseline.  His rhythm remains atrial flutter with 100% ventricular pacing without interruption since his office appointment in August . Due to high grade AV block, this has not had any impact on ventricular rate.  He appeared to be completely oblivious to the arrhythmia in August, but now has developed worsening edema and exertional dyspnea. He has been fully therapeutically anticoagulated over the last month and we tried again to overdrive pace the atrial flutter, again unsuccessfully. There are no episodes of high ventricular rate. The longest single episode of atrial fibrillation lasted for about 9 hours. There is 100% ventricular pacing.  Nathaniel Steele fell yesterday, without loss of consciousness or head injury. He thinks he tripped. He has some small bruises over his left lateral thorax. His abdomen is distended, but not tender and there are no abdominal ecchymoses. I think he has ascites. He has 3-4+ bilateral leg edema to the thighs.  His pacemaker was initially implanted in 2006 for sinus node dysfunction with symptomatic bradycardia and he underwent a generator change out in April of 2015. Although not technically pacemaker dependent his underlying rhythm is extremely slow sinus bradycardia at about 30 beats per minute. He also has a very long AV conduction time at almost 700 ms so that he paces the ventricle 100% of the time. We attempted to discontinue amiodarone to see if this would reduce the frequency of ventricular pacing, unsuccessfully. Amiodarone was restarted 2 to increased burden of atrial fibrillation.  He has a past history of coronary disease and previous two-vessel bypass surgery, aortic valve replacement with a biological  prosthesis in 2013, recurrent paroxysmal atrial tachycardia and fibrillation on chronic warfarin anticoagulation. He has hyperlipidemia and hypertension. His last echo in August 2014 showed LVEF 50-55% and normal bioprosthetic valve function (mean gradient 15 mm Hg), PA pressure 39 mm Hg.  Nathaniel Steele was hospitalized in April 2015 with acute anemia related to spontaneous bleeding in his right lower extremity and again hospitalized in June 2015 with anemia, presumably related to gastrointestinal bleeding. Stools were Hemoccult positive but upper endoscopy, capsule enteroscopy were not revealing. He had a remote normal colonoscopy except for some diverticulosis. He required a total of 2 units of PRBC transfusion in April and 6 units of transfusion in June. On both occasions his warfarin anticoagulation was only slightly above target range. As far as I know he has never had a stroke/TIA or other embolic events.  He gets many of his medications from the [facility] hospital and prefers to have the labs drawn there.  He has stage IV chronic kidney disease and already has a well-developed, mature a fistula in his left forearm. He has not yet started dialysis. His kidney disease is felt to be secondary to lupus nephritis, with a collagen vascular disease in remission for several years. He has a history of neuropathy and restless leg syndrome and had frostbite while in [place] during the war. He has had surgery twice for lumbar spine disease.   Allergies  Allergen Reactions  . Statins     myalgias  . Aspirin Hives    Patient states that he can take the enteric coated but not the uncoated  . Feraheme [Ferumoxytol] Rash and  Other (See Comments)    Abdominal pain  . Penicillins Hives    Current Outpatient Prescriptions  Medication Sig Dispense Refill  . amiodarone (PACERONE) 200 MG tablet Take 200 mg by mouth daily.    Marland Kitchen. aspirin EC 81 MG tablet Take 81 mg by mouth daily.    . calcitRIOL (ROCALTROL) 0.25 MCG capsule Take  0.25 mcg by mouth daily before breakfast.     . calcium acetate (PHOSLO) 667 MG capsule Take 667 mg by mouth 3 (three) times daily with meals.    . Cyanocobalamin (VITAMIN B 12 PO) Take 1 tablet by mouth daily.    . cycloSPORINE (RESTASIS) 0.05 % ophthalmic emulsion Place 1 drop into both eyes 2 (two) times daily.     . ferrous sulfate 325 (65 FE) MG tablet Take 325 mg by mouth daily with breakfast.    . finasteride (PROSCAR) 5 MG tablet Take 5 mg by mouth daily.    . fish oil-omega-3 fatty acids 1000 MG capsule Take 1 g by mouth daily.     . furosemide (LASIX) 40 MG tablet Take 160 mg by mouth 2 (two) times daily. Takes 4 tablets    . metoprolol (LOPRESSOR) 50 MG tablet Take 50 mg by mouth daily before breakfast.     . Multiple Vitamins-Minerals (OCUVITE PRESERVISION PO) Take 1 tablet by mouth daily.    . pantoprazole (PROTONIX) 40 MG tablet Take 1 tablet (40 mg total) by mouth 2 (two) times daily. 60 tablet 0  . polyethylene glycol (MIRALAX / GLYCOLAX) packet Take 17 g by mouth daily as needed for mild constipation.     . pregabalin (LYRICA) 75 MG capsule Take 75 mg by mouth daily.     Marland Kitchen. rOPINIRole (REQUIP) 1 MG tablet Take 2 mg by mouth at bedtime.     Marland Kitchen. warfarin (COUMADIN) 5 MG tablet Take 1 tablet (5 mg total) by mouth daily. (Patient taking differently: Take 2.5-5 mg by mouth daily. Patient takes 2.5 mg on Monday and 5 mg on all other days) 90 tablet 1  . metolazone (ZAROXOLYN) 2.5 MG tablet Take 1 tablet (2.5 mg total) by mouth daily. 90 tablet 3  . [DISCONTINUED] polysaccharide iron (NIFEREX) 150 MG CAPS capsule Take 1 capsule (150 mg total) by mouth daily. For one month then stop. 30 each 0   No current facility-administered medications for this visit.    Past Medical History  Diagnosis Date  . Shortness of breath with exertion  . Chronic kidney disease     not on dialysis yet  . Coronary artery disease 05/14/2010    stress test - no scintigraphic evidence of inducible myocardial  ischemia,; normal study  . Hypertension   . Arthritis   . Blood dyscrasia     one time had low platlet count  . Dysrhythmia     atrial fib  . SSS (sick sinus syndrome) 08/27/2011    echo EF >55%, severe LAE, moderate RAE, tissue AVR w/ gradients 35 and 17mmHg  . Pericardial effusion 03/12/2011    echo EF 55-60%  . Claudication 02/19/2012    LE doppler no evidence of arterial insufficiency, lower extremities demonstrate normal values  w/ no evidence of insufficiency  . Lupus nephritis   . Pacemaker 09/19/2004    implanted  . Restless leg syndrome   . Cellulitis   . Anemia   . Benign prostatic hypertrophy   . Constipation   . Shoulder joint pain     Past Surgical History  Procedure Laterality Date  . Back surgery    . Appendectomy    . Hernia repair    . Pacemaker insertion  2006    Med[employer]onic EnRhythm  . Av fistula placement  02/24/2011  . Aortic valve replacement  02/24/2011    pericardial tissue valve [facility]  . Coronary artery bypass graft  02/24/2011    LIMA to LAD, SVG to distal RCA  . Cardiac catheterization  02/13/2011    mod/severe aortic valve stenosis w peak to peak gradient 25-32 mmHg,  70% stenosis LAD, 30-40%proximal followed by 90% sstenosis in RCA prior to anterior RV margin branch  . Lumbar laminectomy/decompression microdiscectomy Right 04/16/2012    Procedure: DECOMPRESSIVE L4 - L5/ MICRODISCECTOMY ON THE RIGHT 1 LEVEL;  Surgeon: Jacki Conesonald A Gioffre, MD;  Location: [facility];  Service: Orthopedics;  Laterality: Right;  . Esophagogastroduodenoscopy N/A 06/21/2013    Procedure: ESOPHAGOGASTRODUODENOSCOPY (EGD);  Surgeon: Theda BelfastPatrick D Hung, MD;  [facility];  Service: Endoscopy;  Laterality: N/A;  . Givens capsule study N/A 06/21/2013    Procedure: GIVENS CAPSULE STUDY;  Surgeon: Theda BelfastPatrick D Hung, MD;  [facility];  Service: Endoscopy;  Laterality: N/A;    Family History  Problem Relation Age of Onset  . Hypertension Mother   . Kidney disease  Brother     History   Social History  . Marital Status: Married    Spouse Name: Nathaniel Steele    Number of Children: 1  . Years of Education: 6   Occupational History  . Not on file.   Social History Main Topics  . Smoking status: Former Smoker    Types: Cigarettes    Quit date: 02/11/1957  . Smokeless tobacco: Never Used  . Alcohol Use: No  . Drug Use: No  . Sexual Activity: Not on file   Other Topics Concern  . Not on file   Social History Narrative   Patient is married (Nathaniel Steele).   Patient has one child.   Patient does not drink caffeine.   Patient is right-handed.   Patient is retired.   Patient has a 6th grade education.          Review of systems: NYHA class III exertional dyspnea. Edema no longer resolves after overnight supine position, although it is still always worse at the end of the day. Denies angina or syncope or recent bleeding. He has not been weighing himself at home.  The patient specifically denies any chest pain at rest or with exertion, dyspnea at rest, orthopnea, paroxysmal nocturnal dyspnea, syncope, palpitations, focal neurological deficits, intermittent claudication,  cough, hemoptysis or wheezing.  The patient also denies abdominal pain, nausea, vomiting, dysphagia, diarrhea, constipation, polyuria, polydipsia, dysuria, hematuria, frequency, urgency, abnormal bleeding or bruising, fever, chills, unexpected weight changes, mood swings, change in skin or hair texture, change in voice quality, auditory or visual problems, allergic reactions or rashes, new musculoskeletal complaints other than usual "aches and pains".   PHYSICAL EXAM BP 142/70 mmHg  Pulse 72  Resp 16  Ht 5\' 7"  (1.702 m)  Wt 178 lb 9.6 oz (81.012 kg)  BMI 27.97 kg/m2 General: Alert, oriented x3, no distress  Head: no evidence of trauma, PERRL, EOMI, no exophtalmos or lid lag, no myxedema, no xanthelasma; normal ears, nose and oropharynx  Neck: normal jugular venous pulsations and no  hepatojugular reflux; brisk carotid pulses without delay and faint bilateral carotid bruits  Chest: clear to auscultation, no signs of consolidation by percussion or palpation, normal fremitus,  symmetrical and full respiratory excursions; healed sternotomy, healthy right subclavian pacemaker site. Small (4-6 cm diameter) ecchymoses on left lateral chest. No tenderness. Cardiovascular: normal position and quality of the apical impulse, regular rhythm, normal first and paradoxically split second heart sounds, no rubs or gallops, early peaking grade 2/6 systolic ejection murmur, no diastolic murmur  Abdomen: moderate distention - looks like ascites, no masses by palpation, no abnormal pulsatility or arterial bruits, normal bowel sounds, unable to palpate liver  Extremities: Large tortuous left forearm AV fistula with excellent thrill and bruit ; no clubbing, cyanosis; 3-4+ symmetrical soft pitting edema; 2+ radial, ulnar and brachial pulses bilaterally; 2+ right femoral, posterior tibial and dorsalis pedis pulses; 2+ left femoral, posterior tibial and dorsalis pedis pulses; no subclavian or femoral bruits  Neurological: grossly nonfocal except mild resting tremor  EKG: A flutter V paced  Lipid Panel     Component Value Date/Time   CHOL 190 07/08/2012 0955   TRIG 164* 07/08/2012 0955   HDL 28* 07/08/2012 0955   CHOLHDL 6.8 07/08/2012 0955   VLDL 33 07/08/2012 0955   LDLCALC 129* 07/08/2012 0955    BMET    Component Value Date/Time   NA 141 12/06/2013 1159   K 4.2 12/06/2013 1159   CL 105 12/06/2013 1159   CO2 25 12/06/2013 1159   GLUCOSE 88 12/06/2013 1159   BUN 65* 12/06/2013 1159   CREATININE 3.33* 12/06/2013 1159   CREATININE 2.34* 06/23/2013 0410   CALCIUM 8.8 12/06/2013 1159   GFRNONAA 24* 06/23/2013 0410   GFRAA 28* 06/23/2013 0410     ASSESSMENT AND PLAN Acute on chronic diastolic heart failure The atrial flutter may be the culprit here (he did not have CHF with 100% RV  pacing while he had normal atrial rhythm. He may benefit from restoration of normal atrial mechanism and will schedule for a cardioversion. This procedure has been fully reviewed with the patient and informed consent has been obtained.  Pacemaker, MDT implanted 08/06, generator change April 2015 Normal device function today. Attempts at overdrive pacing of atrial flutter were unsuccessful, repeatedly. No permanent reprogramming changes made.   PAF (paroxysmal atrial fibrillation) Despite restarting the amiodarone, he maintains the atrial flutter. Today the rhythm appeared very organized, consistent with atrial flutter with a cycle length of around 320 ms. Attempts at overdrive pacing with bursts with cycle lengths starting at 260 ms and progressing all the way down to 180 ms were all unsuccessful. There was never convincing evidence of entrainment of the arrhythmia. He may have a left atrial flutter. Continue anti-coagulation.  Should target keeping the INR on the lower range (2.0-2.5) due to his history of bleeding problems.  S/P CABG x 2: (LIMA-LAD SVG - RCA) No symptoms of active coronary insufficiency. Need to reevaluate if we cannot improve the CHF. Last nuclear study 2012  AVR 23 mm bioprosthesis Normal function by most recent echocardiogram less than one year ago (peak gradient 26, mean gradient 15).  CKD stage 4 Already has a well developed AV fistula. Anemia and increased CO duwe to fistula may contribute to CHF, but are an unchanged problem and are not causative. The kidney disease pretty much limits us to amiodarone as the only antiarrhythmic choice.  Orders Placed This Encounter  Procedures  . Cardioversion (Bedside)  . APTT  . CBC  . Comprehensive metabolic panel  . Protime-INR  . Implantable device check  . EKG 12-Lead  . 2D Echocardiogram without contrast   Meds ordered this encounter  Medications  . metolazone (ZAROXOLYN) 2.5 MG tablet    Sig: Take 1 tablet (2.5 mg  total) by mouth daily.    Dispense:  90 tablet    Refill:  3    Cherylene Ferrufino  Thurmon Fair, MD, [facility] [phone] office [phone] pager

## 2013-12-08 MED ORDER — SODIUM CHLORIDE 0.9 % IV SOLN
INTRAVENOUS | Status: DC
  Administered 2013-12-09: 08:00:00 via INTRAVENOUS

## 2013-12-08 NOTE — Anesthesia Preprocedure Evaluation (Addendum)
Anesthesia Evaluation  Patient identified by MRN, date of birth, ID band Patient awake    Reviewed: Allergy & Precautions, H&P , NPO status , Patient's Chart, lab work & pertinent test results, reviewed documented beta blocker date and time   Airway Mallampati: II       Dental  (+) Edentulous Upper, Edentulous Lower   Pulmonary former smoker (quit 1959),  breath sounds clear to auscultation        Cardiovascular hypertension, + CAD and +CHF + dysrhythmias Atrial Fibrillation + pacemaker Rhythm:Irregular  CABG, AVR 2014, ECHO 2014 EF 50%, Pacer, ventricular paced continual, A Fib, A Flutter,DOE   Neuro/Psych    GI/Hepatic   Endo/Other    Renal/GU Renal InsufficiencyRenal diseaseCRF 2nd Sarcoid     Musculoskeletal   Abdominal (+)  Abdomen: soft.    Peds  Hematology   Anesthesia Other Findings   Reproductive/Obstetrics                            Anesthesia Physical Anesthesia Plan  ASA: III  Anesthesia Plan: General   Post-op Pain Management:    Induction: Intravenous  Airway Management Planned: Nasal Cannula  Additional Equipment:   Intra-op Plan:   Post-operative Plan:   Informed Consent:   Plan Discussed with:   Anesthesia Plan Comments:         Anesthesia Quick Evaluation

## 2013-12-09 ENCOUNTER — Ambulatory Visit (HOSPITAL_COMMUNITY)
Admission: RE | Admit: 2013-12-09 | Discharge: 2013-12-09 | Disposition: A | Discharge status: Home / Self Care | Payer: Medicare Other | Source: Ambulatory Visit | Attending: Cardiovascular Disease | Admitting: Cardiovascular Disease

## 2013-12-09 ENCOUNTER — Ambulatory Visit (HOSPITAL_COMMUNITY): Payer: Medicare Other | Admitting: Anesthesiology

## 2013-12-09 ENCOUNTER — Encounter (HOSPITAL_COMMUNITY)
Admission: RE | Disposition: A | Discharge status: Home / Self Care | Payer: Self-pay | Source: Ambulatory Visit | Attending: Cardiovascular Disease

## 2013-12-09 ENCOUNTER — Encounter (HOSPITAL_COMMUNITY): Payer: Self-pay | Admitting: *Deleted

## 2013-12-09 DIAGNOSIS — Z880 Allergy status to penicillin: Secondary | ICD-10-CM | POA: Insufficient documentation

## 2013-12-09 DIAGNOSIS — N184 Chronic kidney disease, stage 4 (severe): Secondary | ICD-10-CM | POA: Insufficient documentation

## 2013-12-09 DIAGNOSIS — I129 Hypertensive chronic kidney disease with stage 1 through stage 4 chronic kidney disease, or unspecified chronic kidney disease: Secondary | ICD-10-CM | POA: Diagnosis not present

## 2013-12-09 DIAGNOSIS — Z7901 Long term (current) use of anticoagulants: Secondary | ICD-10-CM | POA: Diagnosis not present

## 2013-12-09 DIAGNOSIS — I495 Sick sinus syndrome: Secondary | ICD-10-CM | POA: Diagnosis not present

## 2013-12-09 DIAGNOSIS — I5033 Acute on chronic diastolic (congestive) heart failure: Secondary | ICD-10-CM | POA: Diagnosis not present

## 2013-12-09 DIAGNOSIS — Z951 Presence of aortocoronary bypass graft: Secondary | ICD-10-CM | POA: Diagnosis not present

## 2013-12-09 DIAGNOSIS — Z7982 Long term (current) use of aspirin: Secondary | ICD-10-CM | POA: Insufficient documentation

## 2013-12-09 DIAGNOSIS — D649 Anemia, unspecified: Secondary | ICD-10-CM | POA: Diagnosis not present

## 2013-12-09 DIAGNOSIS — Z950 Presence of cardiac pacemaker: Secondary | ICD-10-CM | POA: Insufficient documentation

## 2013-12-09 DIAGNOSIS — Z87891 Personal history of nicotine dependence: Secondary | ICD-10-CM | POA: Insufficient documentation

## 2013-12-09 DIAGNOSIS — K5900 Constipation, unspecified: Secondary | ICD-10-CM | POA: Insufficient documentation

## 2013-12-09 DIAGNOSIS — N400 Enlarged prostate without lower urinary tract symptoms: Secondary | ICD-10-CM | POA: Diagnosis not present

## 2013-12-09 DIAGNOSIS — M1990 Unspecified osteoarthritis, unspecified site: Secondary | ICD-10-CM | POA: Insufficient documentation

## 2013-12-09 DIAGNOSIS — I480 Paroxysmal atrial fibrillation: Secondary | ICD-10-CM | POA: Diagnosis not present

## 2013-12-09 DIAGNOSIS — I4892 Unspecified atrial flutter: Secondary | ICD-10-CM

## 2013-12-09 DIAGNOSIS — G2581 Restless legs syndrome: Secondary | ICD-10-CM | POA: Insufficient documentation

## 2013-12-09 DIAGNOSIS — I2510 Atherosclerotic heart disease of native coronary artery without angina pectoris: Secondary | ICD-10-CM | POA: Diagnosis not present

## 2013-12-09 DIAGNOSIS — I4821 Permanent atrial fibrillation: Secondary | ICD-10-CM | POA: Diagnosis present

## 2013-12-09 HISTORY — PX: CARDIOVERSION: SHX1299

## 2013-12-09 LAB — PROTIME-INR
INR: 2.08 — AB (ref 0.00–1.49)
PROTHROMBIN TIME: 23.6 s — AB (ref 11.6–15.2)

## 2013-12-09 SURGERY — CARDIOVERSION
Anesthesia: General

## 2013-12-09 MED ORDER — PROPOFOL 10 MG/ML IV BOLUS
INTRAVENOUS | Status: DC | PRN
  Administered 2013-12-09: 35 mg via INTRAVENOUS

## 2013-12-09 MED ORDER — AMIODARONE HCL 200 MG PO TABS: 400.0000 mg | ORAL_TABLET | Freq: Every day | ORAL | Status: DC

## 2013-12-09 MED ORDER — LACTATED RINGERS IV SOLN
INTRAVENOUS | Status: DC | PRN
  Administered 2013-12-09: 09:00:00 via INTRAVENOUS

## 2013-12-09 MED ORDER — LIDOCAINE HCL (CARDIAC) 20 MG/ML IV SOLN
INTRAVENOUS | Status: DC | PRN
  Administered 2013-12-09: 50 mg via INTRAVENOUS

## 2013-12-09 NOTE — Op Note (Signed)
Procedure: Electrical Cardioversion Indications:  Atrial Flutter  Procedure Details:  Consent: Risks of procedure as well as the alternatives and risks of each were explained to the (patient/caregiver).  Consent for procedure obtained.  Time Out: Verified patient identification, verified procedure, site/side was marked, verified correct patient position, special equipment/implants available, medications/allergies/relevent history reviewed, required imaging and test results available.  Performed  Patient placed on cardiac monitor, pulse oximetry, supplemental oxygen as necessary.  Sedation given: propofol 30 mg IV Dr. Berneice HeinrichManny Pacer pads placed anterior and posterior chest.  Cardioverted 1 time(s).  Cardioverted at 120J.  Evaluation: Findings: Post procedure EKG shows: A-V sequential paced Complications: None Patient did tolerate procedure well.  Time Spent Directly with the Patient:  60 minutes   Wisdom Seybold 12/09/2013, 9:22 AM

## 2013-12-09 NOTE — Anesthesia Postprocedure Evaluation (Signed)
  Anesthesia Post-op Note  Patient: Nathaniel Steele  Procedure(s) Performed: Procedure(s): CARDIOVERSION (N/A)  Patient Location: PACU and Endoscopy Unit  Anesthesia Type:General  Level of Consciousness: awake  Airway and Oxygen Therapy: Patient Spontanous Breathing  Post-op Pain: none  Post-op Assessment: Post-op Vital signs reviewed  Post-op Vital Signs: Reviewed and stable  Last Vitals:  Filed Vitals:   12/09/13 0804  BP: 132/68  Pulse: 73  Temp: 36.4 C  Resp: 19    Complications: No apparent anesthesia complications

## 2013-12-09 NOTE — Transfer of Care (Signed)
Immediate Anesthesia Transfer of Care Note  Patient: Nathaniel Steele  Procedure(s) Performed: Procedure(s): CARDIOVERSION (N/A)  Patient Location: PACU and Endoscopy Unit  Anesthesia Type:MAC  Level of Consciousness: awake, alert  and oriented  Airway & Oxygen Therapy: Patient Spontanous Breathing and Patient connected to nasal cannula oxygen  Post-op Assessment: Report given to PACU RN and Post -op Vital signs reviewed and stable  Post vital signs: Reviewed and stable  Complications: No apparent anesthesia complications

## 2013-12-09 NOTE — H&P (View-Only) (Signed)
Patient ID: Nathaniel Steele, male   DOB: 09/20/1926, 80 y.o.   MRN: [id number]      Reason for office visit Pacemaker check, CAD status post CABG, persistent atrial flutter  Nathaniel Steele has worsened dyspnea and edema. He has gained 7 lb since August, 13 lb from his usual baseline.  His rhythm remains atrial flutter with 100% ventricular pacing without interruption since his office appointment in August . Due to high grade AV block, this has not had any impact on ventricular rate.  He appeared to be completely oblivious to the arrhythmia in August, but now has developed worsening edema and exertional dyspnea. He has been fully therapeutically anticoagulated over the last month and we tried again to overdrive pace the atrial flutter, again unsuccessfully. There are no episodes of high ventricular rate. The longest single episode of atrial fibrillation lasted for about 9 hours. There is 100% ventricular pacing.  Nathaniel Steele fell yesterday, without loss of consciousness or head injury. He thinks he tripped. He has some small bruises over his left lateral thorax. His abdomen is distended, but not tender and there are no abdominal ecchymoses. I think he has ascites. He has 3-4+ bilateral leg edema to the thighs.  His pacemaker was initially implanted in 2006 for sinus node dysfunction with symptomatic bradycardia and he underwent a generator change out in April of 2015. Although not technically pacemaker dependent his underlying rhythm is extremely slow sinus bradycardia at about 30 beats per minute. He also has a very long AV conduction time at almost 700 ms so that he paces the ventricle 100% of the time. We attempted to discontinue amiodarone to see if this would reduce the frequency of ventricular pacing, unsuccessfully. Amiodarone was restarted 2 to increased burden of atrial fibrillation.  He has a past history of coronary disease and previous two-vessel bypass surgery, aortic valve replacement with a biological  prosthesis in 2013, recurrent paroxysmal atrial tachycardia and fibrillation on chronic warfarin anticoagulation. He has hyperlipidemia and hypertension. His last echo in August 2014 showed LVEF 50-55% and normal bioprosthetic valve function (mean gradient 15 mm Hg), PA pressure 39 mm Hg.  Nathaniel Steele was hospitalized in April 2015 with acute anemia related to spontaneous bleeding in his right lower extremity and again hospitalized in June 2015 with anemia, presumably related to gastrointestinal bleeding. Stools were Hemoccult positive but upper endoscopy, capsule enteroscopy were not revealing. He had a remote normal colonoscopy except for some diverticulosis. He required a total of 2 units of PRBC transfusion in April and 6 units of transfusion in June. On both occasions his warfarin anticoagulation was only slightly above target range. As far as I know he has never had a stroke/TIA or other embolic events.  He gets many of his medications from the [facility] hospital and prefers to have the labs drawn there.  He has stage IV chronic kidney disease and already has a well-developed, mature a fistula in his left forearm. He has not yet started dialysis. His kidney disease is felt to be secondary to lupus nephritis, with a collagen vascular disease in remission for several years. He has a history of neuropathy and restless leg syndrome and had frostbite while in [place] during the war. He has had surgery twice for lumbar spine disease.   Allergies  Allergen Reactions  . Statins     myalgias  . Aspirin Hives    Patient states that he can take the enteric coated but not the uncoated  . Feraheme [Ferumoxytol] Rash and  Other (See Comments)    Abdominal pain  . Penicillins Hives    Current Outpatient Prescriptions  Medication Sig Dispense Refill  . amiodarone (PACERONE) 200 MG tablet Take 200 mg by mouth daily.    Marland Kitchen. aspirin EC 81 MG tablet Take 81 mg by mouth daily.    . calcitRIOL (ROCALTROL) 0.25 MCG capsule Take  0.25 mcg by mouth daily before breakfast.     . calcium acetate (PHOSLO) 667 MG capsule Take 667 mg by mouth 3 (three) times daily with meals.    . Cyanocobalamin (VITAMIN B 12 PO) Take 1 tablet by mouth daily.    . cycloSPORINE (RESTASIS) 0.05 % ophthalmic emulsion Place 1 drop into both eyes 2 (two) times daily.     . ferrous sulfate 325 (65 FE) MG tablet Take 325 mg by mouth daily with breakfast.    . finasteride (PROSCAR) 5 MG tablet Take 5 mg by mouth daily.    . fish oil-omega-3 fatty acids 1000 MG capsule Take 1 g by mouth daily.     . furosemide (LASIX) 40 MG tablet Take 160 mg by mouth 2 (two) times daily. Takes 4 tablets    . metoprolol (LOPRESSOR) 50 MG tablet Take 50 mg by mouth daily before breakfast.     . Multiple Vitamins-Minerals (OCUVITE PRESERVISION PO) Take 1 tablet by mouth daily.    . pantoprazole (PROTONIX) 40 MG tablet Take 1 tablet (40 mg total) by mouth 2 (two) times daily. 60 tablet 0  . polyethylene glycol (MIRALAX / GLYCOLAX) packet Take 17 g by mouth daily as needed for mild constipation.     . pregabalin (LYRICA) 75 MG capsule Take 75 mg by mouth daily.     Marland Kitchen. rOPINIRole (REQUIP) 1 MG tablet Take 2 mg by mouth at bedtime.     Marland Kitchen. warfarin (COUMADIN) 5 MG tablet Take 1 tablet (5 mg total) by mouth daily. (Patient taking differently: Take 2.5-5 mg by mouth daily. Patient takes 2.5 mg on Monday and 5 mg on all other days) 90 tablet 1  . metolazone (ZAROXOLYN) 2.5 MG tablet Take 1 tablet (2.5 mg total) by mouth daily. 90 tablet 3  . [DISCONTINUED] polysaccharide iron (NIFEREX) 150 MG CAPS capsule Take 1 capsule (150 mg total) by mouth daily. For one month then stop. 30 each 0   No current facility-administered medications for this visit.    Past Medical History  Diagnosis Date  . Shortness of breath with exertion  . Chronic kidney disease     not on dialysis yet  . Coronary artery disease 05/14/2010    stress test - no scintigraphic evidence of inducible myocardial  ischemia,; normal study  . Hypertension   . Arthritis   . Blood dyscrasia     one time had low platlet count  . Dysrhythmia     atrial fib  . SSS (sick sinus syndrome) 08/27/2011    echo EF >55%, severe LAE, moderate RAE, tissue AVR w/ gradients 35 and 17mmHg  . Pericardial effusion 03/12/2011    echo EF 55-60%  . Claudication 02/19/2012    LE doppler no evidence of arterial insufficiency, lower extremities demonstrate normal values  w/ no evidence of insufficiency  . Lupus nephritis   . Pacemaker 09/19/2004    implanted  . Restless leg syndrome   . Cellulitis   . Anemia   . Benign prostatic hypertrophy   . Constipation   . Shoulder joint pain     Past Surgical History  Procedure Laterality Date  . Back surgery    . Appendectomy    . Hernia repair    . Pacemaker insertion  2006    Medtronic EnRhythm  . Av fistula placement  02/24/2011  . Aortic valve replacement  02/24/2011    pericardial tissue valve [facility] E[facility]  . Coronary artery bypass graft  02/24/2011    LIMA to LAD, SVG to distal RCA  . Cardiac catheterization  02/13/2011    mod/severe aortic valve stenosis w peak to peak gradient 25-32 mmHg,  70% stenosis LAD, 30-40%proximal followed by 90% sstenosis in RCA prior to anterior RV margin branch  . Lumbar laminectomy/decompression microdiscectomy Right 04/16/2012    Procedure: DECOMPRESSIVE L4 - L5/ MICRODISCECTOMY ON THE RIGHT 1 LEVEL;  Surgeon: Jacki Conesonald A Gioffre, MD;  Location: [facility];  Service: Orthopedics;  Laterality: Right;  . Esophagogastroduodenoscopy N/A 06/21/2013    Procedure: ESOPHAGOGASTRODUODENOSCOPY (EGD);  Surgeon: Theda BelfastPatrick D Hung, MD;  [facility];  Service: Endoscopy;  Laterality: N/A;  . Givens capsule study N/A 06/21/2013    Procedure: GIVENS CAPSULE STUDY;  Surgeon: Theda BelfastPatrick D Hung, MD;  [facility];  Service: Endoscopy;  Laterality: N/A;    Family History  Problem Relation Age of Onset  . Hypertension Mother   . Kidney disease  Brother     History   Social History  . Marital Status: Married    Spouse Name: [name]    Number of Children: 1  . Years of Education: 6   Occupational History  . Not on file.   Social History Main Topics  . Smoking status: Former Smoker    Types: Cigarettes    Quit date: 02/11/1957  . Smokeless tobacco: Never Used  . Alcohol Use: No  . Drug Use: No  . Sexual Activity: Not on file   Other Topics Concern  . Not on file   Social History Narrative   Patient is married ([name]).   Patient has one child.   Patient does not drink caffeine.   Patient is right-handed.   Patient is retired.   Patient has a 6th grade education.          Review of systems: NYHA class III exertional dyspnea. Edema no longer resolves after overnight supine position, although it is still always worse at the end of the day. Denies angina or syncope or recent bleeding. He has not been weighing himself at home.  The patient specifically denies any chest pain at rest or with exertion, dyspnea at rest, orthopnea, paroxysmal nocturnal dyspnea, syncope, palpitations, focal neurological deficits, intermittent claudication,  cough, hemoptysis or wheezing.  The patient also denies abdominal pain, nausea, vomiting, dysphagia, diarrhea, constipation, polyuria, polydipsia, dysuria, hematuria, frequency, urgency, abnormal bleeding or bruising, fever, chills, unexpected weight changes, mood swings, change in skin or hair texture, change in voice quality, auditory or visual problems, allergic reactions or rashes, new musculoskeletal complaints other than usual "aches and pains".   PHYSICAL EXAM BP 142/70 mmHg  Pulse 72  Resp 16  Ht 5\' 7"  (1.702 m)  Wt 178 lb 9.6 oz (81.012 kg)  BMI 27.97 kg/m2 General: Alert, oriented x3, no distress  Head: no evidence of trauma, PERRL, EOMI, no exophtalmos or lid lag, no myxedema, no xanthelasma; normal ears, nose and oropharynx  Neck: normal jugular venous pulsations and no  hepatojugular reflux; brisk carotid pulses without delay and faint bilateral carotid bruits  Chest: clear to auscultation, no signs of consolidation by percussion or palpation, normal fremitus,  symmetrical and full respiratory excursions; healed sternotomy, healthy right subclavian pacemaker site. Small (4-6 cm diameter) ecchymoses on left lateral chest. No tenderness. Cardiovascular: normal position and quality of the apical impulse, regular rhythm, normal first and paradoxically split second heart sounds, no rubs or gallops, early peaking grade 2/6 systolic ejection murmur, no diastolic murmur  Abdomen: moderate distention - looks like ascites, no masses by palpation, no abnormal pulsatility or arterial bruits, normal bowel sounds, unable to palpate liver  Extremities: Large tortuous left forearm AV fistula with excellent thrill and bruit ; no clubbing, cyanosis; 3-4+ symmetrical soft pitting edema; 2+ radial, ulnar and brachial pulses bilaterally; 2+ right femoral, posterior tibial and dorsalis pedis pulses; 2+ left femoral, posterior tibial and dorsalis pedis pulses; no subclavian or femoral bruits  Neurological: grossly nonfocal except mild resting tremor  EKG: A flutter V paced  Lipid Panel     Component Value Date/Time   CHOL 190 07/08/2012 0955   TRIG 164* 07/08/2012 0955   HDL 28* 07/08/2012 0955   CHOLHDL 6.8 07/08/2012 0955   VLDL 33 07/08/2012 0955   LDLCALC 129* 07/08/2012 0955    BMET    Component Value Date/Time   NA 141 12/06/2013 1159   K 4.2 12/06/2013 1159   CL 105 12/06/2013 1159   CO2 25 12/06/2013 1159   GLUCOSE 88 12/06/2013 1159   BUN 65* 12/06/2013 1159   CREATININE 3.33* 12/06/2013 1159   CREATININE 2.34* 06/23/2013 0410   CALCIUM 8.8 12/06/2013 1159   GFRNONAA 24* 06/23/2013 0410   GFRAA 28* 06/23/2013 0410     ASSESSMENT AND PLAN Acute on chronic diastolic heart failure The atrial flutter may be the culprit here (he did not have CHF with 100% RV  pacing while he had normal atrial rhythm. He may benefit from restoration of normal atrial mechanism and will schedule for a cardioversion. This procedure has been fully reviewed with the patient and informed consent has been obtained.  Pacemaker, MDT implanted 08/06, generator change April 2015 Normal device function today. Attempts at overdrive pacing of atrial flutter were unsuccessful, repeatedly. No permanent reprogramming changes made.   PAF (paroxysmal atrial fibrillation) Despite restarting the amiodarone, he maintains the atrial flutter. Today the rhythm appeared very organized, consistent with atrial flutter with a cycle length of around 320 ms. Attempts at overdrive pacing with bursts with cycle lengths starting at 260 ms and progressing all the way down to 180 ms were all unsuccessful. There was never convincing evidence of entrainment of the arrhythmia. He may have a left atrial flutter. Continue anti-coagulation.  Should target keeping the INR on the lower range (2.0-2.5) due to his history of bleeding problems.  S/P CABG x 2: (LIMA-LAD SVG - RCA) No symptoms of active coronary insufficiency. Need to reevaluate if we cannot improve the CHF. Last nuclear study 2012  AVR 23 mm bioprosthesis Normal function by most recent echocardiogram less than one year ago (peak gradient 26, mean gradient 15).  CKD stage 4 Already has a well developed AV fistula. Anemia and increased CO duwe to fistula may contribute to CHF, but are an unchanged problem and are not causative. The kidney disease pretty much limits us to amiodarone as the only antiarrhythmic choice.  Orders Placed This Encounter  Procedures  . Cardioversion (Bedside)  . APTT  . CBC  . Comprehensive metabolic panel  . Protime-INR  . Implantable device check  . EKG 12-Lead  . 2D Echocardiogram without contrast   Meds ordered this encounter  Medications  . metolazone (ZAROXOLYN) 2.5 MG tablet    Sig: Take 1 tablet (2.5 mg  total) by mouth daily.    Dispense:  90 tablet    Refill:  3    Jinx Gilden  Thurmon Fair, MD, [facility] [phone] office [phone] pager

## 2013-12-09 NOTE — Anesthesia Procedure Notes (Signed)
Procedure Name: MAC Date/Time: 12/09/2013 9:12 AM Performed by: Gwenyth AllegraADAMI, Terrianne Cavness Pre-anesthesia Checklist: Patient identified, Patient being monitored, Emergency Drugs available and Timeout performed Patient Re-evaluated:Patient Re-evaluated prior to inductionOxygen Delivery Method: Ambu bag Preoxygenation: Pre-oxygenation with 100% oxygen Intubation Type: IV induction

## 2013-12-09 NOTE — Discharge Instructions (Signed)

## 2013-12-09 NOTE — Interval H&P Note (Signed)
History and Physical Interval Note:  12/09/2013 8:24 AM  Nathaniel Steele  has presented today for surgery, with the diagnosis of AFIB  The various methods of treatment have been discussed with the patient and family. After consideration of risks, benefits and other options for treatment, the patient has consented to  Procedure(s): CARDIOVERSION (N/A) as a surgical intervention .  The patient's history has been reviewed, patient examined, no change in status, stable for surgery.  I have reviewed the patient's chart and labs.  Questions were answered to the patient's satisfaction.     Chonda Baney

## 2013-12-12 ENCOUNTER — Telehealth: Payer: Self-pay | Admitting: Cardiovascular Disease

## 2013-12-12 ENCOUNTER — Encounter (HOSPITAL_COMMUNITY): Payer: Self-pay | Admitting: Cardiovascular Disease

## 2013-12-13 ENCOUNTER — Other Ambulatory Visit: Payer: Self-pay

## 2013-12-13 ENCOUNTER — Ambulatory Visit (INDEPENDENT_AMBULATORY_CARE_PROVIDER_SITE_OTHER): Payer: Medicare Other | Admitting: *Deleted

## 2013-12-13 ENCOUNTER — Ambulatory Visit (INDEPENDENT_AMBULATORY_CARE_PROVIDER_SITE_OTHER): Payer: Medicare Other | Admitting: Cardiovascular Disease

## 2013-12-13 ENCOUNTER — Other Ambulatory Visit: Payer: Self-pay | Admitting: Cardiovascular Disease

## 2013-12-13 ENCOUNTER — Encounter: Payer: Self-pay | Admitting: Cardiovascular Disease

## 2013-12-13 ENCOUNTER — Ambulatory Visit (HOSPITAL_COMMUNITY)
Admission: RE | Admit: 2013-12-13 | Discharge: 2013-12-13 | Disposition: A | Discharge status: Home / Self Care | Payer: Medicare Other | Source: Ambulatory Visit | Attending: Cardiovascular Disease | Admitting: Cardiovascular Disease

## 2013-12-13 VITALS — BP 112/68 | HR 83 | Ht 67.0 in | Wt 175.5 lb

## 2013-12-13 DIAGNOSIS — I480 Paroxysmal atrial fibrillation: Secondary | ICD-10-CM

## 2013-12-13 DIAGNOSIS — Z952 Presence of prosthetic heart valve: Secondary | ICD-10-CM

## 2013-12-13 DIAGNOSIS — I5040 Unspecified combined systolic (congestive) and diastolic (congestive) heart failure: Secondary | ICD-10-CM

## 2013-12-13 DIAGNOSIS — Z954 Presence of other heart-valve replacement: Secondary | ICD-10-CM | POA: Diagnosis present

## 2013-12-13 DIAGNOSIS — E785 Hyperlipidemia, unspecified: Secondary | ICD-10-CM | POA: Insufficient documentation

## 2013-12-13 DIAGNOSIS — I10 Essential (primary) hypertension: Secondary | ICD-10-CM | POA: Insufficient documentation

## 2013-12-13 DIAGNOSIS — I495 Sick sinus syndrome: Secondary | ICD-10-CM

## 2013-12-13 DIAGNOSIS — Z7901 Long term (current) use of anticoagulants: Secondary | ICD-10-CM

## 2013-12-13 DIAGNOSIS — Z951 Presence of aortocoronary bypass graft: Secondary | ICD-10-CM

## 2013-12-13 DIAGNOSIS — Z87891 Personal history of nicotine dependence: Secondary | ICD-10-CM | POA: Insufficient documentation

## 2013-12-13 DIAGNOSIS — Z950 Presence of cardiac pacemaker: Secondary | ICD-10-CM

## 2013-12-13 DIAGNOSIS — I4892 Unspecified atrial flutter: Secondary | ICD-10-CM

## 2013-12-13 DIAGNOSIS — I5033 Acute on chronic diastolic (congestive) heart failure: Secondary | ICD-10-CM

## 2013-12-13 DIAGNOSIS — I369 Nonrheumatic tricuspid valve disorder, unspecified: Secondary | ICD-10-CM

## 2013-12-13 LAB — POCT INR: INR: 2.2

## 2013-12-13 MED ORDER — METOLAZONE 2.5 MG PO TABS: 2.5000 mg | ORAL_TABLET | ORAL | Status: DC

## 2013-12-13 MED ORDER — ROPINIROLE HCL 2 MG PO TABS: 2.0000 mg | ORAL_TABLET | Freq: Two times a day (BID) | ORAL | Status: DC

## 2013-12-13 MED ORDER — ROPINIROLE HCL 1 MG PO TABS
2.0000 mg | ORAL_TABLET | Freq: Every day | ORAL | Status: DC
Start: 2013-12-13 — End: 2013-12-13

## 2013-12-13 NOTE — Telephone Encounter (Signed)
Closed encounter °

## 2013-12-13 NOTE — Telephone Encounter (Signed)
Rx was sent to pharmacy electronically. 

## 2013-12-13 NOTE — Progress Notes (Signed)
2D Echocardiogram Complete.  12/13/2013   Mahrosh Donnell, RDCS  

## 2013-12-13 NOTE — Telephone Encounter (Signed)
Pt still have not received his Ropinirole. Would you please call [facility] in today to [facility] on [address] please.

## 2013-12-13 NOTE — Progress Notes (Signed)
Patient ID: Nathaniel Steele, male   DOB: 11/03/1926, 80 y.o.   MRN: [id number]      Reason for office visit atrial fibrillation, f/u after cardioversion  Nathaniel Steele had successful cardioversion for persistent atrial flutter on November 20, for an episode of arrhythmia that has been going on for roughly 3 months. Initially no symptoms were associated with the arrhythmia, but he subsequently developed worsening congestive heart failure with increasing lower extremity edema and dyspnea, not responsive to loop diuretic therapy.  Subjectively, he feels better after the cardioversion. His wife is less convinced that there has been a change in his activity tolerance. He is still in normal rhythm today and has a normal pacemaker check.  His edema is partly improved. After 3 consecutive daily doses of metolazone on top of his loop diuretic he did develop orthostatic dizziness. Today he is not dizzy. He remains therapeutically anticoagulated, today INR 2.2. He has not had any further falls. His bruises are almost healed.  His pacemaker was initially implanted in 2006 for sinus node dysfunction with symptomatic bradycardia and he underwent a generator change out in April of 2015. Although not technically pacemaker dependent his underlying rhythm is extremely slow sinus bradycardia at about 30 beats per minute. He also has a very long AV conduction time at almost 700 ms so that he paces the ventricle 100% of the time. We attempted to discontinue amiodarone to see if this would reduce the frequency of ventricular pacing, unsuccessfully. Amiodarone was restarted secondary to increased burden of atrial fibrillation.  He has a past history of coronary disease and previous two-vessel bypass surgery, aortic valve replacement with a biological prosthesis in 2013, recurrent paroxysmal atrial tachycardia and fibrillation on chronic warfarin anticoagulation. He has hyperlipidemia and hypertension. His last echo in August  2014 showed LVEF 50-55% and normal bioprosthetic valve function (mean gradient 15 mm Hg), PA pressure 39 mm Hg.  Nathaniel Steele was hospitalized in April 2015 with acute anemia related to spontaneous bleeding in his right lower extremity and again hospitalized in June 2015 with anemia, presumably related to gastrointestinal bleeding. Stools were Hemoccult positive but upper endoscopy, capsule enteroscopy were not revealing. He had a remote normal colonoscopy except for some diverticulosis. He required a total of 2 units of PRBC transfusion in April and 6 units of transfusion in June. On both occasions his warfarin anticoagulation was only slightly above target range. As far as I know he has never had a stroke/TIA or other embolic events.  He gets many of his medications from the [facility] hospital and prefers to have the labs drawn there.  He has stage IV chronic kidney disease and already has a well-developed, mature a fistula in his left forearm. He has not yet started dialysis. His kidney disease is felt to be secondary to lupus nephritis, with a collagen vascular disease in remission for several years. He has a history of neuropathy and restless leg syndrome and had frostbite while in [place] during the war. He has had surgery twice for lumbar spine disease.  Allergies  Allergen Reactions  . Statins     myalgias  . Aspirin Hives    Patient states that he can take the enteric coated but not the uncoated  . Feraheme [Ferumoxytol] Rash and Other (See Comments)    Abdominal pain  . Penicillins Hives    Current Outpatient Prescriptions  Medication Sig Dispense Refill  . amiodarone (PACERONE) 200 MG tablet Take 2 tablets (400 mg total) by mouth daily.  60 tablet 11  . aspirin EC 81 MG tablet Take 81 mg by mouth daily.    . calcitRIOL (ROCALTROL) 0.25 MCG capsule Take 0.25 mcg by mouth daily before breakfast.     . calcium acetate (PHOSLO) 667 MG capsule Take 667 mg by mouth 3 (three) times daily with meals.    .  Cyanocobalamin (VITAMIN B 12 PO) Take 1 tablet by mouth daily.    . cycloSPORINE (RESTASIS) 0.05 % ophthalmic emulsion Place 1 drop into both eyes 2 (two) times daily.     . ferrous sulfate 325 (65 FE) MG tablet Take 325 mg by mouth daily with breakfast.    . finasteride (PROSCAR) 5 MG tablet Take 5 mg by mouth daily.    . fish oil-omega-3 fatty acids 1000 MG capsule Take 1 g by mouth daily.     . furosemide (LASIX) 40 MG tablet Take 160 mg by mouth 2 (two) times daily. Takes 4 tablets    . metolazone (ZAROXOLYN) 2.5 MG tablet Take 1 tablet (2.5 mg total) by mouth 2 (two) times a week. Take one tablet twice a week - Tuesday and Fridays 30 tablet 3  . metoprolol (LOPRESSOR) 50 MG tablet Take 50 mg by mouth daily before breakfast.     . Multiple Vitamins-Minerals (OCUVITE PRESERVISION PO) Take 1 tablet by mouth daily.    . pantoprazole (PROTONIX) 40 MG tablet Take 1 tablet (40 mg total) by mouth 2 (two) times daily. 60 tablet 0  . polyethylene glycol (MIRALAX / GLYCOLAX) packet Take 17 g by mouth daily as needed for mild constipation.     . pregabalin (LYRICA) 75 MG capsule Take 75 mg by mouth daily.     Marland Kitchen. warfarin (COUMADIN) 5 MG tablet Take 1 tablet (5 mg total) by mouth daily. (Patient taking differently: Take 2.5-5 mg by mouth daily. Patient takes 2.5 mg on Monday and 5 mg on all other days) 90 tablet 1  . rOPINIRole (REQUIP) 2 MG tablet Take 1 tablet (2 mg total) by mouth 2 (two) times daily. 30 tablet 0  . [DISCONTINUED] polysaccharide iron (NIFEREX) 150 MG CAPS capsule Take 1 capsule (150 mg total) by mouth daily. For one month then stop. 30 each 0   No current facility-administered medications for this visit.    Past Medical History  Diagnosis Date  . Shortness of breath with exertion  . Chronic kidney disease     not on dialysis yet  . Coronary artery disease 05/14/2010    stress test - no scintigraphic evidence of inducible myocardial ischemia,; normal study  . Hypertension   .  Arthritis   . Blood dyscrasia     one time had low platlet count  . Dysrhythmia     atrial fib  . SSS (sick sinus syndrome) 08/27/2011    echo EF >55%, severe LAE, moderate RAE, tissue AVR w/ gradients 35 and 17mmHg  . Pericardial effusion 03/12/2011    echo EF 55-60%  . Claudication 02/19/2012    LE doppler no evidence of arterial insufficiency, lower extremities demonstrate normal values  w/ no evidence of insufficiency  . Lupus nephritis   . Pacemaker 09/19/2004    implanted  . Restless leg syndrome   . Cellulitis   . Anemia   . Benign prostatic hypertrophy   . Constipation   . Shoulder joint pain   . Acute on chronic diastolic CHF (congestive heart failure), NYHA class 3 12/07/2013    Past Surgical History  Procedure Laterality  Date  . Back surgery    . Appendectomy    . Hernia repair    . Pacemaker insertion  2006    Med[employer]onic EnRhythm  . Av fistula placement  02/24/2011  . Aortic valve replacement  02/24/2011    pericardial tissue valve [facility]  . Coronary artery bypass graft  02/24/2011    LIMA to LAD, SVG to distal RCA  . Cardiac catheterization  02/13/2011    mod/severe aortic valve stenosis w peak to peak gradient 25-32 mmHg,  70% stenosis LAD, 30-40%proximal followed by 90% sstenosis in RCA prior to anterior RV margin branch  . Lumbar laminectomy/decompression microdiscectomy Right 04/16/2012    Procedure: DECOMPRESSIVE L4 - L5/ MICRODISCECTOMY ON THE RIGHT 1 LEVEL;  Surgeon: Jacki Conesonald A Gioffre, MD;  Location: [facility];  Service: Orthopedics;  Laterality: Right;  . Esophagogastroduodenoscopy N/A 06/21/2013    Procedure: ESOPHAGOGASTRODUODENOSCOPY (EGD);  Surgeon: Theda BelfastPatrick D Hung, MD;  [facility];  Service: Endoscopy;  Laterality: N/A;  . Givens capsule study N/A 06/21/2013    Procedure: GIVENS CAPSULE STUDY;  Surgeon: Theda BelfastPatrick D Hung, MD;  [facility];  Service: Endoscopy;  Laterality: N/A;  . Cardioversion N/A 12/09/2013    Procedure:  CARDIOVERSION;  Surgeon: Thurmon FairMihai Jolie Strohecker, MD;  Location: [facility] ENDOSCOPY;  Service: Cardiovascular;  Laterality: N/A;    Family History  Problem Relation Age of Onset  . Hypertension Mother   . Kidney disease Brother     History   Social History  . Marital Status: Married    Spouse Name: Nathaniel Steele    Number of Children: 1  . Years of Education: 6   Occupational History  . Not on file.   Social History Main Topics  . Smoking status: Former Smoker    Types: Cigarettes    Quit date: 02/11/1957  . Smokeless tobacco: Never Used  . Alcohol Use: No  . Drug Use: No  . Sexual Activity: Not on file   Other Topics Concern  . Not on file   Social History Narrative   Patient is married (Nathaniel Steele).   Patient has one child.   Patient does not drink caffeine.   Patient is right-handed.   Patient is retired.   Patient has a 6th grade education.          Review of systems: NYHA class III exertional dyspnea. Edema no longer resolves after overnight supine position, although it is still always worse at the end of the day. Denies angina or syncope or recent bleeding. He has not been weighing himself at home.  The patient specifically denies any chest pain at rest or with exertion, dyspnea at rest, orthopnea, paroxysmal nocturnal dyspnea, syncope, palpitations, focal neurological deficits, intermittent claudication, cough, hemoptysis or wheezing.  The patient also denies abdominal pain, nausea, vomiting, dysphagia, diarrhea, constipation, polyuria, polydipsia, dysuria, hematuria, frequency, urgency, abnormal bleeding or bruising, fever, chills, unexpected weight changes, mood swings, change in skin or hair texture, change in voice quality, auditory or visual problems, allergic reactions or rashes, new musculoskeletal complaints other than usual "aches and pains".  PHYSICAL EXAM BP 112/68 mmHg  Pulse 83  Ht 5\' 7"  (1.702 m)  Wt 175 lb 8 oz (79.606 kg)  BMI 27.48 kg/m2 General: Alert, oriented x3,  no distress  Head: no evidence of trauma, PERRL, EOMI, no exophtalmos or lid lag, no myxedema, no xanthelasma; normal ears, nose and oropharynx  Neck: normal jugular venous pulsations and no hepatojugular reflux; brisk carotid pulses without delay and faint  bilateral carotid bruits  Chest: clear to auscultation, no signs of consolidation by percussion or palpation, normal fremitus, symmetrical and full respiratory excursions; healed sternotomy, healthy right subclavian pacemaker site. Small (4-6 cm diameter) ecchymoses on left lateral chest. No tenderness. Cardiovascular: normal position and quality of the apical impulse, regular rhythm, normal first and paradoxically split second heart sounds, no rubs or gallops, early peaking grade 2/6 systolic ejection murmur, no diastolic murmur  Abdomen: moderate distention - looks like ascites, no masses by palpation, no abnormal pulsatility or arterial bruits, normal bowel sounds, unable to palpate liver  Extremities: Large tortuous left forearm AV fistula with excellent thrill and bruit ; no clubbing, cyanosis; 3-4+ symmetrical soft pitting edema; 2+ radial, ulnar and brachial pulses bilaterally; 2+ right femoral, posterior tibial and dorsalis pedis pulses; 2+ left femoral, posterior tibial and dorsalis pedis pulses; no subclavian or femoral bruits  Neurological: grossly nonfocal except mild resting tremor  EKG: AV sequential paced   BMET    Component Value Date/Time   NA 141 12/06/2013 1159   K 4.2 12/06/2013 1159   CL 105 12/06/2013 1159   CO2 25 12/06/2013 1159   GLUCOSE 88 12/06/2013 1159   BUN 65* 12/06/2013 1159   CREATININE 3.33* 12/06/2013 1159   CREATININE 2.34* 06/23/2013 0410   CALCIUM 8.8 12/06/2013 1159   GFRNONAA 24* 06/23/2013 0410   GFRAA 28* 06/23/2013 0410     ASSESSMENT AND PLAN  Acute on chronic diastolic heart failure The atrial flutter may have contributed to this, but his renal failure is also a big problem. He did  not have CHF with 100% RV pacing while he had normal atrial rhythm. It is probably too soon to know if he will benefit from restoration of normal atrial mechanism after cardioversion. We will continue with metolazone 2.5 mg 2 days a week and reevaluate after a few weeks.  Pacemaker, MDT implanted 08/06, generator change April 2015 Normal device function today. Attempts at overdrive pacing of atrial flutter were unsuccessful, repeatedly. Normal device function today  PAF (paroxysmal atrial fibrillation)and sustained atrial flutter Rhythm appeared very organized, consistent with atrial flutter with a cycle length of around 320 ms. Attempts at overdrive pacing with bursts with cycle lengths starting at 260 ms and progressing all the way down to 180 ms were all unsuccessful. There was never convincing evidence of entrainment of the arrhythmia. He may have a left atrial flutter. Continue anti-coagulation.  Should target keeping the INR on the lower range (2.0-2.5) due to his history of bleeding problems.  S/P CABG x 2: (LIMA-LAD SVG - RCA) No symptoms of active coronary insufficiency. Need to reevaluate if we cannot improve the CHF. Last nuclear study 2012  AVR 23 mm bioprosthesis Normal function by most recent echocardiogram less than one year ago (peak gradient 26, mean gradient 15).  CKD stage 4 Already has a well developed AV fistula. Anemia and increased CO due to fistula may contribute to CHF, but are unchanged problems and likely not causative. The kidney disease pretty much limits [place] to amiodarone as the only antiarrhythmic choice.  Orders Placed This Encounter  Procedures  . EKG 12-Lead   Meds ordered this encounter  Medications  . metolazone (ZAROXOLYN) 2.5 MG tablet    Sig: Take 1 tablet (2.5 mg total) by mouth 2 (two) times a week. Take one tablet twice a week - Tuesday and Fridays    Dispense:  30 tablet    Refill:  3  . DISCONTD: rOPINIRole (REQUIP)  1 MG tablet    Sig: Take 2  tablets (2 mg total) by mouth at bedtime.    Dispense:  60 tablet    Refill:  0    Vahe Pienta  Thurmon Fair, MD, [facility] [phone] office [phone] pager ADDENDUM ECHO today  Left ventricle: The cavity size was normal. There was moderate concentric hypertrophy. Systolic function was normal. The estimated ejection fraction was in the range of 55% to 60%. Wall motion was normal; there were no regional wall motion abnormalities. Doppler parameters are consistent with restrictive physiology (grade 3 diastolic dysfunction). The E/A ratio is 2, the MV decel time is 261 msec. The E/e&' ratio is >20, suggsting markedly elevated LV filling pressure. - Aortic valve: Bioprosthetic AVR well seated. No evidence for obstruction (AT<100 msec). There was trivial regurgitation. Mean gradient (S): 15 mm Hg. Peak gradient (S): 32 mm Hg. Valve area (VTI): 0.9 cm^2. Valve area (Vmax): 0.79 cm^2. Valve area (Vmean): 0.79 cm^2. - Mitral valve: Calcified annulus. Mildly thickened leaflets . There was mild regurgitation. - Left atrium: Massively dilated. LA Volume/BSA= 82.3 ml/m2. - Right ventricle: The cavity size was normal. Wall thickness was normal. Pacer wire or catheter noted in right ventricle. Systolic function is reduced. - Right atrium: Severely dilated. The atrium was normal in size. Pacer wire or catheter noted in right atrium. - Tricuspid valve: There was moderate regurgitation. - Pulmonary arteries: PA peak pressure: 45 mm Hg (S). - Inferior vena cava: The vessel was dilated. The respirophasic diameter changes were blunted (< 50%), consistent with elevated central venous pressure.  Still with evidence signs of decompensated biventricular failure with preserved left ventricular ejection fraction. High likelihood of arrhythmia recurrence with severe biatrial enlargement.  Thurmon Fair, MD, [facility] [phone]  office [phone] pager

## 2013-12-13 NOTE — Patient Instructions (Signed)
Take Metolazone 2.5mg  on Tuesdays and Fridays.  Dr. Royann Shiversroitoru recommends that you schedule a follow-up appointment in: 2 months with device check.

## 2013-12-14 ENCOUNTER — Telehealth: Payer: Self-pay | Admitting: *Deleted

## 2013-12-14 DIAGNOSIS — Z79899 Other long term (current) drug therapy: Secondary | ICD-10-CM

## 2013-12-14 NOTE — Telephone Encounter (Signed)
Patient notified Dr. Salena Saner wants him to get labs drawn in 10-14 days.  Order mailed to patient.  Voiced understanding.

## 2013-12-14 NOTE — Telephone Encounter (Signed)
-----   Message from Thurmon FairMihai Croitoru, MD sent at 12/13/2013  5:01 PM EST ----- Heart pumping function is still normal, but there is clear evidence of fluid overload. Need to continue taking the diuretics as prescribed - need to get rid of a lot of fluids but have to do so slowly because of his dizziness and kidney function problems. If I forgot to do so at his visit, please order a basic metabolic panel in 10-14 days please

## 2013-12-19 LAB — MDC_IDC_ENUM_SESS_TYPE_INCLINIC
Battery Remaining Longevity: 87 mo
Brady Statistic AP VP Percent: 100 %
Brady Statistic AS VP Percent: 0 %
Brady Statistic AS VS Percent: 0 %
Lead Channel Impedance Value: 397 Ohm
Lead Channel Setting Pacing Amplitude: 2 V
Lead Channel Setting Pacing Amplitude: 3.75 V
Lead Channel Setting Pacing Pulse Width: 0.46 ms
Lead Channel Setting Sensing Sensitivity: 2.8 mV
MDC IDC MSMT BATTERY IMPEDANCE: 100 Ohm
MDC IDC MSMT BATTERY VOLTAGE: 2.79 V
MDC IDC MSMT LEADCHNL RV IMPEDANCE VALUE: 502 Ohm
MDC IDC SESS DTM: 20151124165902
MDC IDC STAT BRADY AP VS PERCENT: 0 %

## 2013-12-22 ENCOUNTER — Encounter (HOSPITAL_COMMUNITY): Payer: Self-pay | Admitting: Emergency Medicine

## 2013-12-22 ENCOUNTER — Emergency Department (HOSPITAL_COMMUNITY): Payer: Medicare Other

## 2013-12-22 ENCOUNTER — Inpatient Hospital Stay (HOSPITAL_COMMUNITY)
Admission: EM | Admit: 2013-12-22 | Discharge: 2013-12-25 | DRG: 682 | Disposition: A | Discharge status: Home / Self Care | Payer: Medicare Other | Attending: Internal Medicine | Admitting: Internal Medicine

## 2013-12-22 DIAGNOSIS — E860 Dehydration: Secondary | ICD-10-CM | POA: Diagnosis present

## 2013-12-22 DIAGNOSIS — Z950 Presence of cardiac pacemaker: Secondary | ICD-10-CM

## 2013-12-22 DIAGNOSIS — I495 Sick sinus syndrome: Secondary | ICD-10-CM | POA: Diagnosis present

## 2013-12-22 DIAGNOSIS — I5033 Acute on chronic diastolic (congestive) heart failure: Secondary | ICD-10-CM | POA: Diagnosis present

## 2013-12-22 DIAGNOSIS — Z7982 Long term (current) use of aspirin: Secondary | ICD-10-CM

## 2013-12-22 DIAGNOSIS — Z841 Family history of disorders of kidney and ureter: Secondary | ICD-10-CM

## 2013-12-22 DIAGNOSIS — I129 Hypertensive chronic kidney disease with stage 1 through stage 4 chronic kidney disease, or unspecified chronic kidney disease: Secondary | ICD-10-CM | POA: Diagnosis present

## 2013-12-22 DIAGNOSIS — D509 Iron deficiency anemia, unspecified: Secondary | ICD-10-CM | POA: Diagnosis present

## 2013-12-22 DIAGNOSIS — M3214 Glomerular disease in systemic lupus erythematosus: Secondary | ICD-10-CM | POA: Diagnosis present

## 2013-12-22 DIAGNOSIS — M1990 Unspecified osteoarthritis, unspecified site: Secondary | ICD-10-CM | POA: Diagnosis present

## 2013-12-22 DIAGNOSIS — Z7901 Long term (current) use of anticoagulants: Secondary | ICD-10-CM | POA: Diagnosis not present

## 2013-12-22 DIAGNOSIS — Z880 Allergy status to penicillin: Secondary | ICD-10-CM

## 2013-12-22 DIAGNOSIS — N184 Chronic kidney disease, stage 4 (severe): Secondary | ICD-10-CM | POA: Diagnosis present

## 2013-12-22 DIAGNOSIS — Z952 Presence of prosthetic heart valve: Secondary | ICD-10-CM

## 2013-12-22 DIAGNOSIS — I2510 Atherosclerotic heart disease of native coronary artery without angina pectoris: Secondary | ICD-10-CM | POA: Diagnosis present

## 2013-12-22 DIAGNOSIS — G2581 Restless legs syndrome: Secondary | ICD-10-CM | POA: Diagnosis present

## 2013-12-22 DIAGNOSIS — T45515A Adverse effect of anticoagulants, initial encounter: Secondary | ICD-10-CM | POA: Diagnosis present

## 2013-12-22 DIAGNOSIS — N400 Enlarged prostate without lower urinary tract symptoms: Secondary | ICD-10-CM | POA: Diagnosis present

## 2013-12-22 DIAGNOSIS — I480 Paroxysmal atrial fibrillation: Secondary | ICD-10-CM

## 2013-12-22 DIAGNOSIS — Z951 Presence of aortocoronary bypass graft: Secondary | ICD-10-CM

## 2013-12-22 DIAGNOSIS — D631 Anemia in chronic kidney disease: Secondary | ICD-10-CM | POA: Diagnosis present

## 2013-12-22 DIAGNOSIS — Z8249 Family history of ischemic heart disease and other diseases of the circulatory system: Secondary | ICD-10-CM

## 2013-12-22 DIAGNOSIS — Z888 Allergy status to other drugs, medicaments and biological substances status: Secondary | ICD-10-CM | POA: Diagnosis not present

## 2013-12-22 DIAGNOSIS — N179 Acute kidney failure, unspecified: Principal | ICD-10-CM | POA: Diagnosis present

## 2013-12-22 DIAGNOSIS — E039 Hypothyroidism, unspecified: Secondary | ICD-10-CM | POA: Diagnosis present

## 2013-12-22 DIAGNOSIS — I509 Heart failure, unspecified: Secondary | ICD-10-CM | POA: Insufficient documentation

## 2013-12-22 DIAGNOSIS — N185 Chronic kidney disease, stage 5: Secondary | ICD-10-CM

## 2013-12-22 DIAGNOSIS — I4821 Permanent atrial fibrillation: Secondary | ICD-10-CM | POA: Diagnosis present

## 2013-12-22 LAB — COMPREHENSIVE METABOLIC PANEL
ALK PHOS: 76 U/L (ref 39–117)
ALT: 16 U/L (ref 0–53)
AST: 28 U/L (ref 0–37)
Albumin: 3.7 g/dL (ref 3.5–5.2)
Anion gap: 19 — ABNORMAL HIGH (ref 5–15)
BUN: 102 mg/dL — ABNORMAL HIGH (ref 6–23)
CO2: 28 meq/L (ref 19–32)
Calcium: 10.1 mg/dL (ref 8.4–10.5)
Chloride: 88 mEq/L — ABNORMAL LOW (ref 96–112)
Creatinine, Ser: 3.98 mg/dL — ABNORMAL HIGH (ref 0.50–1.35)
GFR calc Af Amer: 14 mL/min — ABNORMAL LOW (ref >90)
GFR calc non Af Amer: 13 mL/min — ABNORMAL LOW (ref >90)
Glucose, Bld: 101 mg/dL — ABNORMAL HIGH (ref 70–99)
Potassium: 4.2 mEq/L (ref 3.7–5.3)
SODIUM: 135 meq/L — AB (ref 137–147)
TOTAL PROTEIN: 7.3 g/dL (ref 6.0–8.3)
Total Bilirubin: 0.6 mg/dL (ref 0.3–1.2)

## 2013-12-22 LAB — URINALYSIS, ROUTINE W REFLEX MICROSCOPIC
Bilirubin Urine: NEGATIVE
GLUCOSE, UA: NEGATIVE mg/dL
Hgb urine dipstick: NEGATIVE
Ketones, ur: NEGATIVE mg/dL
LEUKOCYTES UA: NEGATIVE
Nitrite: NEGATIVE
PH: 7 (ref 5.0–8.0)
Protein, ur: NEGATIVE mg/dL
SPECIFIC GRAVITY, URINE: 1.009 (ref 1.005–1.030)
Urobilinogen, UA: 0.2 mg/dL (ref 0.0–1.0)

## 2013-12-22 LAB — CBC WITH DIFFERENTIAL/PLATELET
Basophils Absolute: 0 10*3/uL (ref 0.0–0.1)
Basophils Relative: 1 % (ref 0–1)
EOS ABS: 0.2 10*3/uL (ref 0.0–0.7)
Eosinophils Relative: 6 % — ABNORMAL HIGH (ref 0–5)
HCT: 27 % — ABNORMAL LOW (ref 39.0–52.0)
Hemoglobin: 8.7 g/dL — ABNORMAL LOW (ref 13.0–17.0)
LYMPHS ABS: 0.4 10*3/uL — AB (ref 0.7–4.0)
Lymphocytes Relative: 12 % (ref 12–46)
MCH: 28 pg (ref 26.0–34.0)
MCHC: 32.2 g/dL (ref 30.0–36.0)
MCV: 86.8 fL (ref 78.0–100.0)
MONO ABS: 0.7 10*3/uL (ref 0.1–1.0)
MONOS PCT: 23 % — AB (ref 3–12)
Neutro Abs: 1.9 10*3/uL (ref 1.7–7.7)
Neutrophils Relative %: 59 % (ref 43–77)
Platelets: 110 10*3/uL — ABNORMAL LOW (ref 150–400)
RBC: 3.11 MIL/uL — AB (ref 4.22–5.81)
RDW: 16 % — ABNORMAL HIGH (ref 11.5–15.5)
WBC: 3.3 10*3/uL — ABNORMAL LOW (ref 4.0–10.5)

## 2013-12-22 LAB — PRO B NATRIURETIC PEPTIDE: Pro B Natriuretic peptide (BNP): 16536 pg/mL — ABNORMAL HIGH (ref 0–450)

## 2013-12-22 LAB — PROTIME-INR
INR: 2.71 — ABNORMAL HIGH (ref 0.00–1.49)
Prothrombin Time: 29 seconds — ABNORMAL HIGH (ref 11.6–15.2)

## 2013-12-22 LAB — TROPONIN I: Troponin I: <0.3 ng/mL (ref <0.30)

## 2013-12-22 MED ORDER — VITAMIN B-1 100 MG PO TABS
100.0000 mg | ORAL_TABLET | Freq: Every day | ORAL | Status: DC
  Administered 2013-12-23 – 2013-12-25 (×3): 100 mg via ORAL
  Filled 2013-12-22 (×3): qty 1

## 2013-12-22 MED ORDER — CYCLOSPORINE 0.05 % OP EMUL
1.0000 [drp] | Freq: Two times a day (BID) | OPHTHALMIC | Status: DC
  Administered 2013-12-22 – 2013-12-25 (×6): 1 [drp] via OPHTHALMIC
  Filled 2013-12-22 (×7): qty 1

## 2013-12-22 MED ORDER — CALCITRIOL 0.25 MCG PO CAPS
0.2500 ug | ORAL_CAPSULE | Freq: Every day | ORAL | Status: DC
  Administered 2013-12-23 – 2013-12-25 (×3): 0.25 ug via ORAL
  Filled 2013-12-22 (×4): qty 1

## 2013-12-22 MED ORDER — METOPROLOL TARTRATE 50 MG PO TABS
50.0000 mg | ORAL_TABLET | Freq: Every day | ORAL | Status: DC
  Administered 2013-12-23 – 2013-12-25 (×3): 50 mg via ORAL
  Filled 2013-12-22 (×4): qty 1

## 2013-12-22 MED ORDER — ACETAMINOPHEN 650 MG RE SUPP: 650.0000 mg | Freq: Four times a day (QID) | RECTAL | Status: DC | PRN

## 2013-12-22 MED ORDER — WARFARIN SODIUM 2.5 MG PO TABS: 2.5000 mg | ORAL_TABLET | ORAL | Status: DC

## 2013-12-22 MED ORDER — ROPINIROLE HCL 1 MG PO TABS
2.0000 mg | ORAL_TABLET | Freq: Two times a day (BID) | ORAL | Status: DC
  Administered 2013-12-22 – 2013-12-25 (×6): 2 mg via ORAL
  Filled 2013-12-22 (×7): qty 2

## 2013-12-22 MED ORDER — DOCUSATE SODIUM 100 MG PO CAPS
100.0000 mg | ORAL_CAPSULE | Freq: Two times a day (BID) | ORAL | Status: DC
  Administered 2013-12-23 – 2013-12-25 (×6): 100 mg via ORAL
  Filled 2013-12-22 (×7): qty 1

## 2013-12-22 MED ORDER — ONDANSETRON HCL 4 MG/2ML IJ SOLN: 4.0000 mg | Freq: Four times a day (QID) | INTRAMUSCULAR | Status: DC | PRN

## 2013-12-22 MED ORDER — OMEGA-3 FATTY ACIDS 1000 MG PO CAPS
1.0000 g | ORAL_CAPSULE | Freq: Every day | ORAL | Status: DC
  Administered 2013-12-23 – 2013-12-25 (×3): 1 g via ORAL
  Filled 2013-12-22 (×4): qty 1

## 2013-12-22 MED ORDER — OXYCODONE HCL 5 MG PO TABS
5.0000 mg | ORAL_TABLET | ORAL | Status: DC | PRN
  Administered 2013-12-23 – 2013-12-24 (×3): 5 mg via ORAL
  Filled 2013-12-22: qty 1

## 2013-12-22 MED ORDER — FOLIC ACID 1 MG PO TABS
1.0000 mg | ORAL_TABLET | Freq: Every day | ORAL | Status: DC
  Administered 2013-12-23 – 2013-12-25 (×3): 1 mg via ORAL
  Filled 2013-12-22 (×3): qty 1

## 2013-12-22 MED ORDER — WARFARIN SODIUM 5 MG PO TABS
5.0000 mg | ORAL_TABLET | ORAL | Status: DC
  Administered 2013-12-23 (×2): 5 mg via ORAL
  Filled 2013-12-22 (×3): qty 1

## 2013-12-22 MED ORDER — WARFARIN - PHARMACIST DOSING INPATIENT: Freq: Every day | Status: DC

## 2013-12-22 MED ORDER — AMIODARONE HCL 200 MG PO TABS
400.0000 mg | ORAL_TABLET | Freq: Every day | ORAL | Status: DC
  Administered 2013-12-23 – 2013-12-25 (×3): 400 mg via ORAL
  Filled 2013-12-22 (×3): qty 2

## 2013-12-22 MED ORDER — SODIUM CHLORIDE 0.9 % IJ SOLN
3.0000 mL | Freq: Two times a day (BID) | INTRAMUSCULAR | Status: DC
  Administered 2013-12-23 – 2013-12-25 (×5): 3 mL via INTRAVENOUS

## 2013-12-22 MED ORDER — ADULT MULTIVITAMIN W/MINERALS CH
1.0000 | ORAL_TABLET | Freq: Every day | ORAL | Status: DC
  Administered 2013-12-23 – 2013-12-25 (×3): 1 via ORAL
  Filled 2013-12-22 (×3): qty 1

## 2013-12-22 MED ORDER — ONDANSETRON HCL 4 MG PO TABS: 4.0000 mg | ORAL_TABLET | Freq: Four times a day (QID) | ORAL | Status: DC | PRN

## 2013-12-22 MED ORDER — BISACODYL 5 MG PO TBEC: 5.0000 mg | DELAYED_RELEASE_TABLET | Freq: Every day | ORAL | Status: DC | PRN

## 2013-12-22 MED ORDER — FINASTERIDE 5 MG PO TABS
5.0000 mg | ORAL_TABLET | Freq: Every day | ORAL | Status: DC
  Administered 2013-12-23 – 2013-12-25 (×3): 5 mg via ORAL
  Filled 2013-12-22 (×3): qty 1

## 2013-12-22 MED ORDER — VITAMIN B-12 100 MCG PO TABS
ORAL_TABLET | Freq: Every day | ORAL | Status: DC
  Administered 2013-12-23: 10:00:00 via ORAL
  Administered 2013-12-24: 100 ug via ORAL
  Filled 2013-12-22 (×4): qty 1

## 2013-12-22 MED ORDER — ACETAMINOPHEN 325 MG PO TABS: 650.0000 mg | ORAL_TABLET | Freq: Four times a day (QID) | ORAL | Status: DC | PRN

## 2013-12-22 MED ORDER — MORPHINE SULFATE 4 MG/ML IJ SOLN
4.0000 mg | Freq: Once | INTRAMUSCULAR | Status: AC
  Administered 2013-12-22: 4 mg via INTRAVENOUS

## 2013-12-22 NOTE — ED Provider Notes (Signed)
CSN: [id number]     Arrival date & time 12/22/13  1733 History   First MD Initiated Contact with Patient 12/22/13 1804     Chief Complaint  Patient presents with  . Weakness     (Consider location/radiation/quality/duration/timing/severity/associated sxs/prior Treatment) HPI Comments: Patient is an 80 year old male with extensive past medical history including coronary artery disease with CABG, pacemaker, chronic renal insufficiency, aortic valve replacement, and anemia. He presents today for evaluation of weakness that has been progressing over the past 7 days, but worse over the last 2 days. He states he feels as if he has no energy and even walking to the mailbox he has to stop several times to catch his breath. He feels like his legs are weak and is concerned he may be anemic. He had similar symptoms a year to ago related to anemia which required a blood transfusion.  His primary care physician is Dr. Marjory LiesBrent Steele at [facility].    Patient is a 80 y.o. male presenting with weakness. The history is provided by the patient.  Weakness This is a recurrent problem. The current episode started 2 days ago. The problem occurs constantly. The problem has been gradually worsening. Associated symptoms include shortness of breath. Pertinent negatives include no chest pain. The symptoms are aggravated by walking and exertion. Nothing relieves the symptoms. He has tried nothing for the symptoms. The treatment provided no relief.    Past Medical History  Diagnosis Date  . Shortness of breath with exertion  . Chronic kidney disease     not on dialysis yet  . Coronary artery disease 05/14/2010    stress test - no scintigraphic evidence of inducible myocardial ischemia,; normal study  . Hypertension   . Arthritis   . Blood dyscrasia     one time had low platlet count  . Dysrhythmia     atrial fib  . SSS (sick sinus syndrome) 08/27/2011    echo EF >55%, severe LAE, moderate RAE,  tissue AVR w/ gradients 35 and 17mmHg  . Pericardial effusion 03/12/2011    echo EF 55-60%  . Claudication 02/19/2012    LE doppler no evidence of arterial insufficiency, lower extremities demonstrate normal values  w/ no evidence of insufficiency  . Lupus nephritis   . Pacemaker 09/19/2004    implanted  . Restless leg syndrome   . Cellulitis   . Anemia   . Benign prostatic hypertrophy   . Constipation   . Shoulder joint pain   . Acute on chronic diastolic CHF (congestive heart failure), NYHA class 3 12/07/2013   Past Surgical History  Procedure Laterality Date  . Back surgery    . Appendectomy    . Hernia repair    . Pacemaker insertion  2006    Medtronic EnRhythm  . Av fistula placement  02/24/2011  . Aortic valve replacement  02/24/2011    pericardial tissue valve [facility] Ease  . Coronary artery bypass graft  02/24/2011    LIMA to LAD, SVG to distal RCA  . Cardiac catheterization  02/13/2011    mod/severe aortic valve stenosis w peak to peak gradient 25-32 mmHg,  70% stenosis LAD, 30-40%proximal followed by 90% sstenosis in RCA prior to anterior RV margin branch  . Lumbar laminectomy/decompression microdiscectomy Right 04/16/2012    Procedure: DECOMPRESSIVE L4 - L5/ MICRODISCECTOMY ON THE RIGHT 1 LEVEL;  Surgeon: Nathaniel Conesonald A Gioffre, MD;  Location: [facility];  Service: Orthopedics;  Laterality: Right;  . Esophagogastroduodenoscopy N/A 06/21/2013  Procedure: ESOPHAGOGASTRODUODENOSCOPY (EGD);  Surgeon: Nathaniel Belfast, MD;  Location: [facility] ENDOSCOPY;  Service: Endoscopy;  Laterality: N/A;  . Givens capsule study N/A 06/21/2013    Procedure: GIVENS CAPSULE STUDY;  Surgeon: Nathaniel Belfast, MD;  Location: [facility] ENDOSCOPY;  Service: Endoscopy;  Laterality: N/A;  . Cardioversion N/A 12/09/2013    Procedure: CARDIOVERSION;  Surgeon: Nathaniel Fair, MD;  Location: [facility] ENDOSCOPY;  Service: Cardiovascular;  Laterality: N/A;   Family History  Problem Relation Age of Onset  . Hypertension Mother   .  Kidney disease Brother    History  Substance Use Topics  . Smoking status: Former Smoker    Types: Cigarettes    Quit date: 02/11/1957  . Smokeless tobacco: Never Used  . Alcohol Use: No    Review of Systems  Constitutional: Positive for fatigue.  Respiratory: Positive for shortness of breath.   Cardiovascular: Negative for chest pain.  Neurological: Positive for weakness.  All other systems reviewed and are negative.     Allergies  Statins; Aspirin; Feraheme; and Penicillins  Home Medications   Prior to Admission medications   Medication Sig Start Date End Date Taking? Authorizing Provider  amiodarone (PACERONE) 200 MG tablet Take 2 tablets (400 mg total) by mouth daily. 12/09/13   Nathaniel Croitoru, MD  aspirin EC 81 MG tablet Take 81 mg by mouth daily.    Historical Provider, MD  calcitRIOL (ROCALTROL) 0.25 MCG capsule Take 0.25 mcg by mouth daily before breakfast.     Historical Provider, MD  calcium acetate (PHOSLO) 667 MG capsule Take 667 mg by mouth 3 (three) times daily with meals.    Historical Provider, MD  Cyanocobalamin (VITAMIN B 12 PO) Take 1 tablet by mouth daily.    Historical Provider, MD  cycloSPORINE (RESTASIS) 0.05 % ophthalmic emulsion Place 1 drop into both eyes 2 (two) times daily.     Historical Provider, MD  ferrous sulfate 325 (65 FE) MG tablet Take 325 mg by mouth daily with breakfast.    Historical Provider, MD  finasteride (PROSCAR) 5 MG tablet Take 5 mg by mouth daily.    Historical Provider, MD  fish oil-omega-3 fatty acids 1000 MG capsule Take 1 g by mouth daily.     Historical Provider, MD  furosemide (LASIX) 40 MG tablet Take 160 mg by mouth 2 (two) times daily. Takes 4 tablets    Historical Provider, MD  metolazone (ZAROXOLYN) 2.5 MG tablet Take 1 tablet (2.5 mg total) by mouth 2 (two) times a week. Take one tablet twice a week - Tuesday and Fridays 12/13/13   Nathaniel Croitoru, MD  metoprolol (LOPRESSOR) 50 MG tablet Take 50 mg by mouth daily  before breakfast.     Historical Provider, MD  Multiple Vitamins-Minerals (OCUVITE PRESERVISION PO) Take 1 tablet by mouth daily.    Historical Provider, MD  pantoprazole (PROTONIX) 40 MG tablet Take 1 tablet (40 mg total) by mouth 2 (two) times daily. 06/23/13   Joseph Art, DO  polyethylene glycol (MIRALAX / GLYCOLAX) packet Take 17 g by mouth daily as needed for mild constipation.     Historical Provider, MD  pregabalin (LYRICA) 75 MG capsule Take 75 mg by mouth daily.     Historical Provider, MD  rOPINIRole (REQUIP) 2 MG tablet Take 1 tablet (2 mg total) by mouth 2 (two) times daily. 12/13/13   Nathaniel Croitoru, MD  warfarin (COUMADIN) 5 MG tablet Take 1 tablet (5 mg total) by mouth daily. Patient taking differently: Take 2.5-5 mg  by mouth daily. Patient takes 2.5 mg on Monday and 5 mg on all other days 11/18/13   Phillips HayKristin Alvstad, RPH-CPP   BP 116/61 mmHg  Pulse 72  Temp(Src) 97.6 F (36.4 C) (Oral)  Resp 16  SpO2 91% Physical Exam  Constitutional: He is oriented to person, place, and time.  Patient is an elderly male in no acute distress. He is awake alert and appropriate. He does appear chronically ill.  HENT:  Head: Normocephalic and atraumatic.  Mouth/Throat: Oropharynx is clear and moist.  Neck: Normal range of motion. Neck supple.  Cardiovascular: Normal rate and regular rhythm.   Murmur heard. Pulmonary/Chest: Effort normal and breath sounds normal. No respiratory distress. He has no wheezes.  Abdominal: Soft. Bowel sounds are normal. He exhibits no distension. There is no tenderness.  Musculoskeletal: Normal range of motion. He exhibits edema.  There is 2+ pitting edema of the bilateral lower extremities.  Lymphadenopathy:    He has no cervical adenopathy.  Neurological: He is alert and oriented to person, place, and time. No cranial nerve deficit. He exhibits normal muscle tone. Coordination normal.  Skin: Skin is warm and dry.  Nursing note and vitals reviewed.   ED  Course  Procedures (including critical care time) Labs Review Labs Reviewed - No data to display  Imaging Review No results found.   EKG Interpretation   Date/Time:  Thursday December 22 2013 18:31:28 EST Ventricular Rate:  72 PR Interval:  32 QRS Duration: 224 QT Interval:  545 QTC Calculation: 597 R Axis:   -76 Text Interpretation:  Atrial-ventricular dual-paced rhythm No further  analysis attempted due to paced rhythm Confirmed by DELOS  MD, Katherine Tout  [phone]([id number]) on 12/22/2013 6:38:36 PM      MDM   Final diagnoses:  None    Patient is an 80 year old male who presents with complaints of weakness. He is concerned that he may be anemic as this has happened to him in the past. Workup reveals worsening renal function with azotemia he also appears to be in pulmonary edema with findings of this and his chest x-ray and elevated BNP. I've spoken with cardiology who feels that this patient would be best served admitted to the medicine service. Dr. Welton FlakesKhan has been notified and agrees to admit.  I have refrained from administering fluids or diuretics as I am unsure whether this patient is volume overloaded due to the BNP or requires fluids due to the worsening renal function.    Geoffery Lyonsouglas Bernard Slayden, MD 12/23/13 [phone]

## 2013-12-22 NOTE — H&P (Signed)
[facility] History and Physical  Nathaniel Steele ZOX:[id number] DOB: 10-24-1926 DOA: 12/22/2013  Referring physician: Geoffery Lyons, MD PCP: Delorse Lek, MD   Chief Complaint: Weakness  HPI: Nathaniel Steele is a 80 y.o. male presents with weakness. Patient states that he has noted increased fatigue and has been feeling very weak. He states that he feels like when he has been anemic in the past. He has no chest pain noted. He has been no more short of breath than his baseline. Patient states that he has no cough or congestion. He does have a baseline renal failure and his last creatinine was 3.3 in November. Today in the ED he was noted to have a creatinine of 3.98. In addition he was noted on the CXR to have some increased interstitial densities noted read as pulmonary edema. Patient does have a significant petechial rash noted on his lower extremities and he states that he has been seen at the [facility] for this. Patient also has a diagnosis of SLE mentioned in his medical record. Other medical issues include chronic anticoagulation for atrial fibrillation and also has a history of pacer placement for a sick sinus syndrome.   Review of Systems:  Constitutional:  No weight loss, night sweats, Fevers, chills, ++fatigue.  HEENT:  No headaches, no sneezing, itching, ear ache, nasal congestion, post nasal drip,  Cardio-vascular:  No chest pain, Orthopnea, PND, swelling in lower extremities, palpitations  GI:  No heartburn, indigestion, abdominal pain, nausea, vomiting, diarrhea  Resp:  ++shortness of breath with exertion. no productive cough, No non-productive cough, No coughing up of blood  Skin:  ++petechial rash GU:  no dysuria, change in color of urine No flank pain.  Musculoskeletal:  No joint pain or swelling. No decreased range of motion  Psych:  No change in mood or affect. No depression or anxiety.   Past Medical History  Diagnosis Date  . Shortness of breath with exertion  .  Chronic kidney disease     not on dialysis yet  . Coronary artery disease 05/14/2010    stress test - no scintigraphic evidence of inducible myocardial ischemia,; normal study  . Hypertension   . Arthritis   . Blood dyscrasia     one time had low platlet count  . Dysrhythmia     atrial fib  . SSS (sick sinus syndrome) 08/27/2011    echo EF >55%, severe LAE, moderate RAE, tissue AVR w/ gradients 35 and  . Pericardial effusion 03/12/2011    echo EF 55-60%  . Claudication 02/19/2012    LE doppler no evidence of arterial insufficiency, lower extremities demonstrate normal values  w/ no evidence of insufficiency  . Lupus nephritis   . Pacemaker 09/19/2004    implanted  . Restless leg syndrome   . Cellulitis   . Anemia   . Benign prostatic hypertrophy   . Constipation   . Shoulder joint pain   . Acute on chronic diastolic CHF (congestive heart failure), NYHA class 3 12/07/2013   Past Surgical History  Procedure Laterality Date  . Back surgery    . Appendectomy    . Hernia repair    . Pacemaker insertion  2006    Medtronic EnRhythm  . Av fistula placement  02/24/2011  . Aortic valve replacement  02/24/2011    pericardial tissue valve [facility] Ease  . Coronary artery bypass graft  02/24/2011    LIMA to LAD, SVG to distal RCA  . Cardiac catheterization  02/13/2011    mod/severe aortic valve stenosis w peak to peak gradient 25-32 mmHg,  70% stenosis LAD, 30-40%proximal followed by 90% sstenosis in RCA prior to anterior RV margin branch  . Lumbar laminectomy/decompression microdiscectomy Right 04/16/2012    Procedure: DECOMPRESSIVE L4 - L5/ MICRODISCECTOMY ON THE RIGHT 1 LEVEL;  Surgeon: Jacki Cones, MD;  Location: [facility]S;  Service: Orthopedics;  Laterality: Right;  . Esophagogastroduodenoscopy N/A 06/21/2013    Procedure: ESOPHAGOGASTRODUODENOSCOPY (EGD);  Surgeon: Theda Belfast, MD;  Location: [facility] ENDOSCOPY;  Service: Endoscopy;  Laterality: N/A;  . Givens capsule study N/A  06/21/2013    Procedure: GIVENS CAPSULE STUDY;  Surgeon: Theda Belfast, MD;  Location: [facility] ENDOSCOPY;  Service: Endoscopy;  Laterality: N/A;  . Cardioversion N/A 12/09/2013    Procedure: CARDIOVERSION;  Surgeon: Thurmon Fair, MD;  Location: [facility] ENDOSCOPY;  Service: Cardiovascular;  Laterality: N/A;   Social History:  reports that he quit smoking about 56 years ago. His smoking use included Cigarettes. He smoked 0.00 packs per day. He has never used smokeless tobacco. He reports that he does not drink alcohol or use illicit drugs.  Allergies  Allergen Reactions  . Statins Other (See Comments)    myalgias  . Aspirin Hives    Patient states that he can take the enteric coated but not the uncoated  . Feraheme [Ferumoxytol] Rash and Other (See Comments)    Abdominal pain  . Penicillins Hives    Family History  Problem Relation Age of Onset  . Hypertension Mother   . Kidney disease Brother      Prior to Admission medications   Medication Sig Start Date End Date Taking? Authorizing Provider  amiodarone (PACERONE) 200 MG tablet Take 2 tablets (400 mg total) by mouth daily. 12/09/13  Yes Mihai Croitoru, MD  aspirin EC 81 MG tablet Take 81 mg by mouth daily.   Yes Historical Provider, MD  calcitRIOL (ROCALTROL) 0.25 MCG capsule Take 0.25 mcg by mouth daily before breakfast.    Yes Historical Provider, MD  calcium acetate (PHOSLO) 667 MG capsule Take 667 mg by mouth 3 (three) times daily with meals.   Yes Historical Provider, MD  Cyanocobalamin (VITAMIN B 12 PO) Take 1 tablet by mouth daily.   Yes Historical Provider, MD  cycloSPORINE (RESTASIS) 0.05 % ophthalmic emulsion Place 1 drop into both eyes 2 (two) times daily.    Yes Historical Provider, MD  finasteride (PROSCAR) 5 MG tablet Take 5 mg by mouth daily.   Yes Historical Provider, MD  fish oil-omega-3 fatty acids 1000 MG capsule Take 1 g by mouth daily.    Yes Historical Provider, MD  furosemide (LASIX) 40 MG tablet Take 160 mg by mouth 2  (two) times daily. Takes 4 tablets   Yes Historical Provider, MD  metolazone (ZAROXOLYN) 2.5 MG tablet Take 1 tablet (2.5 mg total) by mouth 2 (two) times a week. Take one tablet twice a week - Tuesday and Fridays 12/13/13  Yes Mihai Croitoru, MD  metoprolol (LOPRESSOR) 50 MG tablet Take 50 mg by mouth daily before breakfast.    Yes Historical Provider, MD  Multiple Vitamins-Minerals (OCUVITE PRESERVISION PO) Take 1 tablet by mouth daily.   Yes Historical Provider, MD  rOPINIRole (REQUIP) 2 MG tablet Take 1 tablet (2 mg total) by mouth 2 (two) times daily. 12/13/13  Yes Mihai Croitoru, MD  warfarin (COUMADIN) 5 MG tablet Take 1 tablet (5 mg total) by mouth daily. Patient taking differently: Take 2.5-5 mg by mouth  daily at 6 PM. Patient takes 2.5 mg on Monday and 5 mg on all other days 11/18/13  Yes Phillips HayKristin Alvstad, RPH-CPP  pantoprazole (PROTONIX) 40 MG tablet Take 1 tablet (40 mg total) by mouth 2 (two) times daily. 06/23/13   Joseph ArtJessica U Vann, DO   Physical Exam: Filed Vitals:   12/22/13 1845 12/22/13 1900 12/22/13 1915 12/22/13 2017  BP:  120/63 121/63 132/65  Pulse: 71 70 71 67  Temp:      TempSrc:      Resp: 17 15 15 16   SpO2: 93% 94% 90% 90%    Wt Readings from Last 3 Encounters:  12/13/13 79.606 kg (175 lb 8 oz)  12/06/13 81.012 kg (178 lb 9.6 oz)  08/23/13 77.792 kg (171 lb 8 oz)    General:  Appears calm and comfortable Eyes: PERRL, normal lids, irises & conjunctiva ENT: grossly normal hearing, lips & tongue Neck: no LAD, masses or thyromegaly Cardiovascular: IRR, no m/r/g. trace LE edema. Telemetry: SR, no arrhythmias  Respiratory: CTA bilaterally, no w/r/r. Normal respiratory effort. Abdomen: soft, distended non-tender Skin: ++rash seen on both Lower extremities Musculoskeletal: grossly normal tone BUE/BLE Psychiatric: grossly normal mood and affect Neurologic: grossly non-focal          Labs on Admission:  Basic Metabolic Panel:  Recent Labs Lab 12/22/13 1850    NA 135*  K 4.2  CL 88*  CO2 28  GLUCOSE 101*  BUN 102*  CREATININE 3.98*  CALCIUM 10.1   Liver Function Tests:  Recent Labs Lab 12/22/13 1850  AST 28  ALT 16  ALKPHOS 76  BILITOT 0.6  PROT 7.3  ALBUMIN 3.7   No results for input(s): LIPASE, AMYLASE in the last 168 hours. No results for input(s): AMMONIA in the last 168 hours. CBC:  Recent Labs Lab 12/22/13 1850  WBC 3.3*  NEUTROABS 1.9  HGB 8.7*  HCT 27.0*  MCV 86.8  PLT 110*   Cardiac Enzymes:  Recent Labs Lab 12/22/13 1850  TROPONINI <0.30    BNP (last 3 results)  Recent Labs  12/22/13 1850  PROBNP 16536.0*   CBG: No results for input(s): GLUCAP in the last 168 hours.  Radiological Exams on Admission: Dg Chest 2 View  12/22/2013   CLINICAL DATA:  Generalized weakness for 1 day  EXAM: CHEST  2 VIEW  COMPARISON:  06/21/2013  FINDINGS: Moderate cardiomegaly. Diffuse bilateral airspace and interstitial opacities have increased worrisome for pulmonary edema. Dual lead right subclavian pacemaker and leads are stable and intact. Postoperative change. No pneumothorax. Stable L1 compression fracture.  IMPRESSION: Interval development of pulmonary edema   Electronically Signed   By: Maryclare BeanArt  Hoss M.D.   On: 12/22/2013 20:17     Assessment/Plan Active Problems:   PAF (paroxysmal atrial fibrillation)   Chronic anticoagulation, on Coumadin prior to admission   RLS (restless legs syndrome)   CKD (chronic kidney disease) stage 4, GFR 15-29 ml/min   CKD (chronic kidney disease) stage 5, GFR less than 15 ml/min   1. Acute on Chronic renal failure CKD Stage V -there has been some worsening of the renal function. -he has been on diuretics will hold -monitor labs -case discussed with nephrology suggested holding IV fluids and holding lasix right now -will monitor fluid status and if he becomes hypoxic will consider lasix IV  2. RLS -continue with home medications  3. Chronic Anticoagulation -will have  pharmacy dose coumadin -monitor for bleeding  4. Atrial fibrillation -rate controlled -on anticoagulation -looks paced  currently  5. CHF -CXR shows some increased vascular congestion but also has worsening renal function -hold diuretics for now and will monitor -will check echo  6. Petechial Rash -unclear if related to SLE or related to coumadin -will send C3 C4 and Anti DNA antibodies  7. Anemia -likely of chronic disease -will check stool guaics    Code Status: Full Code (must indicate code status--if unknown or must be presumed, indicate so) DVT Prophylaxis:on Coumadin Family Communication: Wife and Son (indicate person spoken with, if applicable, with phone number if by telephone) Disposition Plan: Home (indicate anticipated LOS)  Time spent: 70min  [facility] Campus[facility]AN,SAADAT A [facility] Pager [phone]

## 2013-12-22 NOTE — Progress Notes (Signed)
Pt admitted to the unit. Pt is alert and oriented. Pt oriented to room, staff, and call bell. Educated on fall safety plan. Bed in lowest position. Full assessment to Epic. Call bell with in reach. Educated to call for assists. Will continue to monitor. Contina Strain, RN 

## 2013-12-22 NOTE — Progress Notes (Signed)
ANTICOAGULATION CONSULT NOTE - Initial Consult  Pharmacy Consult for Coumadin Indication: atrial fibrillation  Allergies  Allergen Reactions  . Statins Other (See Comments)    myalgias  . Aspirin Hives    Patient states that he can take the enteric coated but not the uncoated  . Feraheme [Ferumoxytol] Rash and Other (See Comments)    Abdominal pain  . Penicillins Hives    Patient Measurements: Weight: 175 lb 0.7 oz (79.4 kg)  Vital Signs: Temp: 97.8 F (36.6 C) (12/03 2216) Temp Source: Oral (12/03 2216) BP: 132/72 mmHg (12/03 2216) Pulse Rate: 71 (12/03 2216)  Labs:  Recent Labs  12/22/13 1850  HGB 8.7*  HCT 27.0*  PLT 110*  LABPROT 29.0*  INR 2.71*  CREATININE 3.98*  TROPONINI <0.30    Estimated Creatinine Clearance: 13.7 mL/min (by C-G formula based on Cr of 3.98).   Medical History: Past Medical History  Diagnosis Date  . Shortness of breath with exertion  . Chronic kidney disease     not on dialysis yet  . Coronary artery disease 05/14/2010    stress test - no scintigraphic evidence of inducible myocardial ischemia,; normal study  . Hypertension   . Arthritis   . Blood dyscrasia     one time had low platlet count  . Dysrhythmia     atrial fib  . SSS (sick sinus syndrome) 08/27/2011    echo EF >55%, severe LAE, moderate RAE, tissue AVR w/ gradients 35 and 17mmHg  . Pericardial effusion 03/12/2011    echo EF 55-60%  . Claudication 02/19/2012    LE doppler no evidence of arterial insufficiency, lower extremities demonstrate normal values  w/ no evidence of insufficiency  . Lupus nephritis   . Pacemaker 09/19/2004    implanted  . Restless leg syndrome   . Cellulitis   . Anemia   . Benign prostatic hypertrophy   . Constipation   . Shoulder joint pain   . Acute on chronic diastolic CHF (congestive heart failure), NYHA class 3 12/07/2013    Medications:  Prescriptions prior to admission  Medication Sig Dispense Refill Last Dose  . amiodarone  (PACERONE) 200 MG tablet Take 2 tablets (400 mg total) by mouth daily. 60 tablet 11 12/22/2013 at Unknown time  . aspirin EC 81 MG tablet Take 81 mg by mouth daily.   12/22/2013 at Unknown time  . calcitRIOL (ROCALTROL) 0.25 MCG capsule Take 0.25 mcg by mouth daily before breakfast.    12/22/2013 at Unknown time  . calcium acetate (PHOSLO) 667 MG capsule Take 667 mg by mouth 3 (three) times daily with meals.   12/22/2013 at Unknown time  . Cyanocobalamin (VITAMIN B 12 PO) Take 1 tablet by mouth daily.   12/22/2013 at Unknown time  . cycloSPORINE (RESTASIS) 0.05 % ophthalmic emulsion Place 1 drop into both eyes 2 (two) times daily.    12/22/2013 at Unknown time  . finasteride (PROSCAR) 5 MG tablet Take 5 mg by mouth daily.   12/22/2013 at Unknown time  . fish oil-omega-3 fatty acids 1000 MG capsule Take 1 g by mouth daily.    12/22/2013 at Unknown time  . furosemide (LASIX) 40 MG tablet Take 160 mg by mouth 2 (two) times daily. Takes 4 tablets   12/22/2013 at Unknown time  . metolazone (ZAROXOLYN) 2.5 MG tablet Take 1 tablet (2.5 mg total) by mouth 2 (two) times a week. Take one tablet twice a week - Tuesday and Fridays 30 tablet 3 Past Week at  Unknown time  . metoprolol (LOPRESSOR) 50 MG tablet Take 50 mg by mouth daily before breakfast.    12/22/2013 at 0800  . Multiple Vitamins-Minerals (OCUVITE PRESERVISION PO) Take 1 tablet by mouth daily.   12/22/2013 at Unknown time  . rOPINIRole (REQUIP) 2 MG tablet Take 1 tablet (2 mg total) by mouth 2 (two) times daily. 30 tablet 0 12/22/2013 at Unknown time  . warfarin (COUMADIN) 5 MG tablet Take 1 tablet (5 mg total) by mouth daily. (Patient taking differently: Take 2.5-5 mg by mouth daily at 6 PM. Patient takes 2.5 mg on Monday and 5 mg on all other days) 90 tablet 1 12/21/2013 at Unknown time  . pantoprazole (PROTONIX) 40 MG tablet Take 1 tablet (40 mg total) by mouth 2 (two) times daily. 60 tablet 0 Taking   Scheduled:  . [START ON 12/23/2013] amiodarone  400 mg  Oral Daily  . [START ON 12/23/2013] calcitRIOL  0.25 mcg Oral QAC breakfast  . cycloSPORINE  1 drop Both Eyes BID  . docusate sodium  100 mg Oral BID  . [START ON 12/23/2013] finasteride  5 mg Oral Daily  . [START ON 12/23/2013] fish oil-omega-3 fatty acids  1 g Oral Daily  . [START ON 12/23/2013] folic acid  1 mg Oral Daily  . [START ON 12/23/2013] metoprolol  50 mg Oral QAC breakfast  . [START ON 12/23/2013] multivitamin with minerals  1 tablet Oral Daily  . rOPINIRole  2 mg Oral BID  . sodium chloride  3 mL Intravenous Q12H  . [START ON 12/23/2013] thiamine  100 mg Oral Daily  . [START ON 12/23/2013] vitamin B-12   Oral Daily    Assessment: 80yo male c/o weakness, found to be in acute on CRF, to continue Coumadin for PAF; current INR therapeutic, last dose PTA was 12/2.  Goal of Therapy:  INR 2-3   Plan:  Will continue home Coumadin dose of 5mg  daily except 2.5mg  Monday and monitor INR.  Vernard GamblesVeronda Maylene Crocker, PharmD, BCPS  12/22/2013,11:17 PM

## 2013-12-22 NOTE — ED Notes (Signed)
Pt here for weakness and leg pain same as when has been anemic in past; pt sts recent fall; pt appears pale and O2 sats in low 90's

## 2013-12-23 ENCOUNTER — Ambulatory Visit: Payer: Medicare Other | Admitting: Pharmacist Clinician (PhC)/ Clinical Pharmacy Specialist

## 2013-12-23 ENCOUNTER — Encounter (HOSPITAL_COMMUNITY): Payer: Self-pay | Admitting: *Deleted

## 2013-12-23 ENCOUNTER — Encounter: Payer: Self-pay | Admitting: Cardiovascular Disease

## 2013-12-23 DIAGNOSIS — N179 Acute kidney failure, unspecified: Principal | ICD-10-CM

## 2013-12-23 DIAGNOSIS — N184 Chronic kidney disease, stage 4 (severe): Secondary | ICD-10-CM

## 2013-12-23 LAB — T3, FREE: T3, Free: 2.1 pg/mL — ABNORMAL LOW (ref 2.3–4.2)

## 2013-12-23 LAB — COMPREHENSIVE METABOLIC PANEL
ALT: 14 U/L (ref 0–53)
AST: 22 U/L (ref 0–37)
Albumin: 3.4 g/dL — ABNORMAL LOW (ref 3.5–5.2)
Alkaline Phosphatase: 69 U/L (ref 39–117)
Anion gap: 18 — ABNORMAL HIGH (ref 5–15)
BUN: 103 mg/dL — ABNORMAL HIGH (ref 6–23)
CO2: 29 meq/L (ref 19–32)
Calcium: 9.5 mg/dL (ref 8.4–10.5)
Chloride: 91 mEq/L — ABNORMAL LOW (ref 96–112)
Creatinine, Ser: 4 mg/dL — ABNORMAL HIGH (ref 0.50–1.35)
GFR calc Af Amer: 14 mL/min — ABNORMAL LOW (ref >90)
GFR, EST NON AFRICAN AMERICAN: 12 mL/min — AB (ref >90)
Glucose, Bld: 93 mg/dL (ref 70–99)
Potassium: 3.7 mEq/L (ref 3.7–5.3)
SODIUM: 138 meq/L (ref 137–147)
Total Bilirubin: 0.6 mg/dL (ref 0.3–1.2)
Total Protein: 6.4 g/dL (ref 6.0–8.3)

## 2013-12-23 LAB — TROPONIN I
Troponin I: <0.3 ng/mL (ref <0.30)
Troponin I: <0.3 ng/mL (ref <0.30)

## 2013-12-23 LAB — CBC
HCT: 25.1 % — ABNORMAL LOW (ref 39.0–52.0)
Hemoglobin: 7.9 g/dL — ABNORMAL LOW (ref 13.0–17.0)
MCH: 27.4 pg (ref 26.0–34.0)
MCHC: 31.5 g/dL (ref 30.0–36.0)
MCV: 87.2 fL (ref 78.0–100.0)
PLATELETS: 90 10*3/uL — AB (ref 150–400)
RBC: 2.88 MIL/uL — ABNORMAL LOW (ref 4.22–5.81)
RDW: 16.1 % — ABNORMAL HIGH (ref 11.5–15.5)
WBC: 3.2 10*3/uL — AB (ref 4.0–10.5)

## 2013-12-23 LAB — TSH: TSH: 8.82 u[IU]/mL — ABNORMAL HIGH (ref 0.350–4.500)

## 2013-12-23 LAB — SODIUM, URINE, RANDOM: SODIUM UR: 66 meq/L

## 2013-12-23 LAB — PROTIME-INR
INR: 2.6 — ABNORMAL HIGH (ref 0.00–1.49)
Prothrombin Time: 28 seconds — ABNORMAL HIGH (ref 11.6–15.2)

## 2013-12-23 LAB — OSMOLALITY, URINE: OSMOLALITY UR: 305 mosm/kg — AB (ref 390–1090)

## 2013-12-23 LAB — GLUCOSE, CAPILLARY: Glucose-Capillary: 104 mg/dL — ABNORMAL HIGH (ref 70–99)

## 2013-12-23 LAB — OSMOLALITY: OSMOLALITY: 313 mosm/kg — AB (ref 275–300)

## 2013-12-23 LAB — IRON AND TIBC
IRON: 40 ug/dL — AB (ref 42–135)
Saturation Ratios: 14 % — ABNORMAL LOW (ref 20–55)
TIBC: 283 ug/dL (ref 215–435)
UIBC: 243 ug/dL (ref 125–400)

## 2013-12-23 LAB — FERRITIN: Ferritin: 245 ng/mL (ref 22–322)

## 2013-12-23 LAB — PHOSPHORUS: Phosphorus: 7.3 mg/dL — ABNORMAL HIGH (ref 2.3–4.6)

## 2013-12-23 LAB — T4: T4 TOTAL: 4.5 ug/dL — AB (ref 4.5–12.0)

## 2013-12-23 LAB — CREATININE, URINE, RANDOM: CREATININE, URINE: 34.77 mg/dL

## 2013-12-23 MED ORDER — SODIUM CHLORIDE 0.9 % IV SOLN
INTRAVENOUS | Status: DC
  Administered 2013-12-23: 09:00:00 via INTRAVENOUS

## 2013-12-23 NOTE — Progress Notes (Signed)
ANTICOAGULATION CONSULT NOTE - Consult  Pharmacy Consult for Coumadin Indication: atrial fibrillation  Allergies  Allergen Reactions  . Statins Other (See Comments)    myalgias  . Aspirin Hives    Patient states that he can take the enteric coated but not the uncoated  . Feraheme [Ferumoxytol] Rash and Other (See Comments)    Abdominal pain  . Penicillins Hives    Patient Measurements: Height: 5\' 7"  (170.2 cm) Weight: 175 lb 0.7 oz (79.4 kg) IBW/kg (Calculated) : 66.1  Vital Signs: Temp: 98.4 F (36.9 C) (12/04 0956) Temp Source: Oral (12/04 0956) BP: 118/61 mmHg (12/04 0956) Pulse Rate: 73 (12/04 0956)  Labs:  Recent Labs  12/22/13 1850 12/23/13 12/23/13 0446 12/23/13 1017  HGB 8.7*  --  7.9*  --   HCT 27.0*  --  25.1*  --   PLT 110*  --  90*  --   LABPROT 29.0*  --  28.0*  --   INR 2.71*  --  2.60*  --   CREATININE 3.98*  --  4.00*  --   TROPONINI <0.30 <0.30 <0.30 <0.30    Estimated Creatinine Clearance: 13.6 mL/min (by C-G formula based on Cr of 4).   Medical History: Past Medical History  Diagnosis Date  . Shortness of breath with exertion  . Chronic kidney disease     not on dialysis yet  . Coronary artery disease 05/14/2010    stress test - no scintigraphic evidence of inducible myocardial ischemia,; normal study  . Hypertension   . Arthritis   . Blood dyscrasia     one time had low platlet count  . Dysrhythmia     atrial fib  . SSS (sick sinus syndrome) 08/27/2011    echo EF >55%, severe LAE, moderate RAE, tissue AVR w/ gradients 35 and 17mmHg  . Pericardial effusion 03/12/2011    echo EF 55-60%  . Claudication 02/19/2012    LE doppler no evidence of arterial insufficiency, lower extremities demonstrate normal values  w/ no evidence of insufficiency  . Lupus nephritis   . Pacemaker 09/19/2004    implanted  . Restless leg syndrome   . Cellulitis   . Anemia   . Benign prostatic hypertrophy   . Constipation   . Shoulder joint pain   . Acute  on chronic diastolic CHF (congestive heart failure), NYHA class 3 12/07/2013    Medications:  Prescriptions prior to admission  Medication Sig Dispense Refill Last Dose  . amiodarone (PACERONE) 200 MG tablet Take 2 tablets (400 mg total) by mouth daily. 60 tablet 11 12/22/2013 at Unknown time  . aspirin EC 81 MG tablet Take 81 mg by mouth daily.   12/22/2013 at Unknown time  . calcitRIOL (ROCALTROL) 0.25 MCG capsule Take 0.25 mcg by mouth daily before breakfast.    12/22/2013 at Unknown time  . calcium acetate (PHOSLO) 667 MG capsule Take 667 mg by mouth 3 (three) times daily with meals.   12/22/2013 at Unknown time  . Cyanocobalamin (VITAMIN B 12 PO) Take 1 tablet by mouth daily.   12/22/2013 at Unknown time  . cycloSPORINE (RESTASIS) 0.05 % ophthalmic emulsion Place 1 drop into both eyes 2 (two) times daily.    12/22/2013 at Unknown time  . finasteride (PROSCAR) 5 MG tablet Take 5 mg by mouth daily.   12/22/2013 at Unknown time  . fish oil-omega-3 fatty acids 1000 MG capsule Take 1 g by mouth daily.    12/22/2013 at Unknown time  . furosemide (  LASIX) 40 MG tablet Take 160 mg by mouth 2 (two) times daily. Takes 4 tablets   12/22/2013 at Unknown time  . metolazone (ZAROXOLYN) 2.5 MG tablet Take 1 tablet (2.5 mg total) by mouth 2 (two) times a week. Take one tablet twice a week - Tuesday and Fridays 30 tablet 3 Past Week at Unknown time  . metoprolol (LOPRESSOR) 50 MG tablet Take 50 mg by mouth daily before breakfast.    12/22/2013 at 0800  . Multiple Vitamins-Minerals (OCUVITE PRESERVISION PO) Take 1 tablet by mouth daily.   12/22/2013 at Unknown time  . rOPINIRole (REQUIP) 2 MG tablet Take 1 tablet (2 mg total) by mouth 2 (two) times daily. 30 tablet 0 12/22/2013 at Unknown time  . warfarin (COUMADIN) 5 MG tablet Take 1 tablet (5 mg total) by mouth daily. (Patient taking differently: Take 2.5-5 mg by mouth daily at 6 PM. Patient takes 2.5 mg on Monday and 5 mg on all other days) 90 tablet 1 12/21/2013 at  Unknown time  . pantoprazole (PROTONIX) 40 MG tablet Take 1 tablet (40 mg total) by mouth 2 (two) times daily. 60 tablet 0 Taking   Scheduled:  . amiodarone  400 mg Oral Daily  . calcitRIOL  0.25 mcg Oral QAC breakfast  . cycloSPORINE  1 drop Both Eyes BID  . docusate sodium  100 mg Oral BID  . finasteride  5 mg Oral Daily  . fish oil-omega-3 fatty acids  1 g Oral Daily  . folic acid  1 mg Oral Daily  . metoprolol  50 mg Oral QAC breakfast  . multivitamin with minerals  1 tablet Oral Daily  . rOPINIRole  2 mg Oral BID  . sodium chloride  3 mL Intravenous Q12H  . thiamine  100 mg Oral Daily  . vitamin B-12   Oral Daily  . [START ON 12/26/2013] warfarin  2.5 mg Oral Q Mon-1800  . warfarin  5 mg Oral Once per day on Sun Tue Wed Thu Fri Sat  . Warfarin - Pharmacist Dosing Inpatient   Does not apply q1800    Assessment: 80yo male continuing on Coumadin for PAF. INR remains therapeutic, 2.71>>2.6. Pt has hx GI bleeds. No current documentation of bleed. Hg low decr 7.9, plt decr 110>>90. -PTA Coumadin dose of 5mg  daily except 2.5mg  Monday  Goal of Therapy:  INR 2-3   Plan:  -Continue pta Coumadin dose - 5mg  daily, except 2.5mg  Monday -Daily PT/INR -Monitor H/H, pltc -Monitor s/sx bleed  Babs BertinHaley Arelis Neumeier, PharmD Clinical Pharmacist - Resident Pager [phone] 12/23/2013 12:46 PM

## 2013-12-23 NOTE — Consult Note (Signed)
Nathaniel Steele is an 80 y.o. male referred by Dr Thedore MinsSingh   Chief Complaint: Acute on CKD 4, anemia, sec HPTH HPI: 80yo WM admitted last night for feeling weak.  Has CKD 4 with baseline Scr 3.3 and now Scr up to 4. He has a remote hx of focal proliferative lupus nephritis with chronic progression of CKD over many years and no recent flares.   CXR shows mild pulm edema.  No OBS sxs.  Says he has been taking his meds.  Has hx GI bleeds and is on coumadin.  Hg in July was 10.2, 12/06/13 9.3 and today 7.9.  His appetite has been great.  Past Medical History  Diagnosis Date  . Shortness of breath with exertion  . Chronic kidney disease     not on dialysis yet  . Coronary artery disease 05/14/2010    stress test - no scintigraphic evidence of inducible myocardial ischemia,; normal study  . Hypertension   . Arthritis   . Blood dyscrasia     one time had low platlet count  . Dysrhythmia     atrial fib  . SSS (sick sinus syndrome) 08/27/2011    echo EF >55%, severe LAE, moderate RAE, tissue AVR w/ gradients 35 and 17mmHg  . Pericardial effusion 03/12/2011    echo EF 55-60%  . Claudication 02/19/2012    LE doppler no evidence of arterial insufficiency, lower extremities demonstrate normal values  w/ no evidence of insufficiency  . Lupus nephritis   . Pacemaker 09/19/2004    implanted  . Restless leg syndrome   . Cellulitis   . Anemia   . Benign prostatic hypertrophy   . Constipation   . Shoulder joint pain   . Acute on chronic diastolic CHF (congestive heart failure), NYHA class 3 12/07/2013    Past Surgical History  Procedure Laterality Date  . Back surgery    . Appendectomy    . Hernia repair    . Pacemaker insertion  2006    [employer] EnRhythm  . Av fistula placement  02/24/2011  . Aortic valve replacement  02/24/2011    pericardial tissue valve [facility] Ease  . Coronary artery bypass graft  02/24/2011    LIMA to LAD, SVG to distal RCA  . Cardiac catheterization  02/13/2011   mod/severe aortic valve stenosis w peak to peak gradient 25-32 mmHg,  70% stenosis LAD, 30-40%proximal followed by 90% sstenosis in RCA prior to anterior RV margin branch  . Lumbar laminectomy/decompression microdiscectomy Right 04/16/2012    Procedure: DECOMPRESSIVE L4 - L5/ MICRODISCECTOMY ON THE RIGHT 1 LEVEL;  Surgeon: Jacki Conesonald A Gioffre, MD;  Location: [facility] ORS;  Service: Orthopedics;  Laterality: Right;  . Esophagogastroduodenoscopy N/A 06/21/2013    Procedure: ESOPHAGOGASTRODUODENOSCOPY (EGD);  Surgeon: Theda BelfastPatrick D Hung, MD;  [facility];  Service: Endoscopy;  Laterality: N/A;  . Givens capsule study N/A 06/21/2013    Procedure: GIVENS CAPSULE STUDY;  Surgeon: Theda BelfastPatrick D Hung, MD;  [facility];  Service: Endoscopy;  Laterality: N/A;  . Cardioversion N/A 12/09/2013    Procedure: CARDIOVERSION;  Surgeon: Thurmon FairMihai Croitoru, MD;  Location: [facility] ENDOSCOPY;  Service: Cardiovascular;  Laterality: N/A;    Family History  Problem Relation Age of Onset  . Hypertension Mother   . Kidney disease Brother   Brother died of "Bright's Ds" in [id number].  Social History:  reports that he quit smoking about 56 years ago. His smoking use included Cigarettes. He smoked 0.00 packs per day. He has never  used smokeless tobacco. He reports that he does not drink alcohol or use illicit drugs.  Allergies:  Allergies  Allergen Reactions  . Statins Other (See Comments)    myalgias  . Aspirin Hives    Patient states that he can take the enteric coated but not the uncoated  . Feraheme [Ferumoxytol] Rash and Other (See Comments)    Abdominal pain  . Penicillins Hives    Medications Prior to Admission  Medication Sig Dispense Refill  . amiodarone (PACERONE) 200 MG tablet Take 2 tablets (400 mg total) by mouth daily. 60 tablet 11  . aspirin EC 81 MG tablet Take 81 mg by mouth daily.    . calcitRIOL (ROCALTROL) 0.25 MCG capsule Take 0.25 mcg by mouth daily before breakfast.     . calcium acetate (PHOSLO) 667  MG capsule Take 667 mg by mouth 3 (three) times daily with meals.    . Cyanocobalamin (VITAMIN B 12 PO) Take 1 tablet by mouth daily.    . cycloSPORINE (RESTASIS) 0.05 % ophthalmic emulsion Place 1 drop into both eyes 2 (two) times daily.     . finasteride (PROSCAR) 5 MG tablet Take 5 mg by mouth daily.    . fish oil-omega-3 fatty acids 1000 MG capsule Take 1 g by mouth daily.     . furosemide (LASIX) 40 MG tablet Take 160 mg by mouth 2 (two) times daily. Takes 4 tablets    . metolazone (ZAROXOLYN) 2.5 MG tablet Take 1 tablet (2.5 mg total) by mouth 2 (two) times a week. Take one tablet twice a week - Tuesday and Fridays 30 tablet 3  . metoprolol (LOPRESSOR) 50 MG tablet Take 50 mg by mouth daily before breakfast.     . Multiple Vitamins-Minerals (OCUVITE PRESERVISION PO) Take 1 tablet by mouth daily.    Marland Kitchen. rOPINIRole (REQUIP) 2 MG tablet Take 1 tablet (2 mg total) by mouth 2 (two) times daily. 30 tablet 0  . warfarin (COUMADIN) 5 MG tablet Take 1 tablet (5 mg total) by mouth daily. (Patient taking differently: Take 2.5-5 mg by mouth daily at 6 PM. Patient takes 2.5 mg on Monday and 5 mg on all other days) 90 tablet 1  . pantoprazole (PROTONIX) 40 MG tablet Take 1 tablet (40 mg total) by mouth 2 (two) times daily. 60 tablet 0     Lab Results: UA: benign   Recent Labs  12/22/13 1850 12/23/13 0446  WBC 3.3* 3.2*  HGB 8.7* 7.9*  HCT 27.0* 25.1*  PLT 110* 90*   BMET  Recent Labs  12/22/13 1850 12/23/13 0446  NA 135* 138  K 4.2 3.7  CL 88* 91*  CO2 28 29  GLUCOSE 101* 93  BUN 102* 103*  CREATININE 3.98* 4.00*  CALCIUM 10.1 9.5   LFT  Recent Labs  12/23/13 0446  PROT 6.4  ALBUMIN 3.4*  AST 22  ALT 14  ALKPHOS 69  BILITOT 0.6   Dg Chest 2 View  12/22/2013   CLINICAL DATA:  Generalized weakness for 1 day  EXAM: CHEST  2 VIEW  COMPARISON:  06/21/2013  FINDINGS: Moderate cardiomegaly. Diffuse bilateral airspace and interstitial opacities have increased worrisome for  pulmonary edema. Dual lead right subclavian pacemaker and leads are stable and intact. Postoperative change. No pneumothorax. Stable L1 compression fracture.  IMPRESSION: Interval development of pulmonary edema   Electronically Signed   By: Maryclare BeanArt  Hoss M.D.   On: 12/22/2013 20:17    ROS: No change in vision Denies SOB  No CP No abd pain  No N/V.  No melena/hematochezia Chronic pain in hips Chronic peripheral neuropathy No dysuria  PHYSICAL EXAM: Blood pressure 118/61, pulse 73, temperature 98.4 F (36.9 C), temperature source Oral, resp. rate 18, height 5\' 7"  (1.702 m), weight 79.4 kg (175 lb 0.7 oz), SpO2 90 %. HEENT: PERRLA EOMI NECK:No CVD Chest:  Pacemaker Rt upper chest LUNGS:Faint bibasilar crackles CARDIAC:RRR w 3/6 sytolic M ABD:+ BS NTND No HSM EXT:0-tr edema.  Petechial rash over both lower ext from knees on down. Lt AVF + bruit, well matured GU foley in place with fair amt of urine NEURO:CNI Ox3 No asterixis  Assessment: 1. Mild Acute on CKD 4.  He is not uremic and not in need of HD at this time though he says he would do if/when it is needed 2. Anemia 3. Sec HPTH 4. Hx lupus PLAN: 1. DC IV fluids 2. Cont calcitriol 3. If iron levels low then will give IV iron.  He may need ESA if no evidence of acute bleed and iron levels OK 4. Check PTH and PO4 5. Daily labs   Ethelda Deangelo T 12/23/2013, 11:14 AM

## 2013-12-23 NOTE — Plan of Care (Signed)
Problem: Phase I Progression Outcomes Goal: Hemodynamically stable Outcome: Completed/Met Date Met:  12/23/13     

## 2013-12-23 NOTE — Progress Notes (Signed)
Patient Demographics  Nathaniel Steele, is a 80 y.o. male, DOB - 08/27/1926, ZOX:[id number]  Admit date - 12/22/2013   Admitting Physician Yevonne PaxSaadat A Khan, MD  Outpatient Primary MD for the patient is Delorse LekBURNETT,BRENT A, MD  LOS - 1   Chief Complaint  Patient presents with  . Weakness        Subjective:   Nathaniel Steele today has, No headache, No chest pain, No abdominal pain - No Nausea, No new weakness tingling or numbness, No Cough - SOB.   Assessment & Plan    1. Generalized weakness with dehydration and acute renal failure. Likely due to overdiuresis, does appear dehydrated, will obtain urine electrolytes, urine osmolality and serum osmolality, hold Lasix, gentle IV fluids for hydration, PT evaluation. May require placement.   2. Paroxysmal atrial fibrillation. On comminution of amiodarone, metoprolol and Coumadin. Continue. Pharmacy monitoring INR. No acute issues.    3. Chronic diastolic CHF. EF 55%. Dehydrated, hold diuretic, continue rate control agents as above, monitor weight and clinically.    4. ARF on chronic kidney disease stage IV. Baseline creatinine 2, hold diuretic, gentle hydration, urine electrolytes, monitor BMP.     5. Falls at home a few weeks ago. With left rib cage injury. PT eval. May require placement.    6. Mildly elevated TSH. Check free T3 and T4.    7. Petechial rash in both legs. Likely due to Coumadin, C3-C4 , Anti DNA  ordered on admission pending.      Code Status: Full  Family Communication: None present  Disposition Plan: To be decided   Procedures recent echogram from November 2015 noted. EF 55-60%. Does have diastolic dysfunction.   Consults      Medications  Scheduled Meds: . amiodarone  400 mg Oral Daily  . calcitRIOL  0.25 mcg  Oral QAC breakfast  . cycloSPORINE  1 drop Both Eyes BID  . docusate sodium  100 mg Oral BID  . finasteride  5 mg Oral Daily  . fish oil-omega-3 fatty acids  1 g Oral Daily  . folic acid  1 mg Oral Daily  . metoprolol  50 mg Oral QAC breakfast  . multivitamin with minerals  1 tablet Oral Daily  . rOPINIRole  2 mg Oral BID  . sodium chloride  3 mL Intravenous Q12H  . thiamine  100 mg Oral Daily  . vitamin B-12   Oral Daily  . [START ON 12/26/2013] warfarin  2.5 mg Oral Q Mon-1800  . warfarin  5 mg Oral Once per day on Sun Tue Wed Thu Fri Sat  . Warfarin - Pharmacist Dosing Inpatient   Does not apply q1800   Continuous Infusions: . sodium chloride     PRN Meds:.acetaminophen **OR** acetaminophen, bisacodyl, ondansetron **OR** ondansetron (ZOFRAN) IV, oxyCODONE  DVT Prophylaxis  Coumadin-TEDs  Lab Results  Component Value Date   PLT 90* 12/23/2013   Lab Results  Component Value Date   INR 2.60* 12/23/2013   INR 2.71* 12/22/2013   INR 2.2 12/13/2013     Antibiotics     Anti-infectives    None          Objective:   Filed Vitals:   12/22/13 2017 12/22/13 2216 12/23/13 0108 12/23/13 [id number]  BP: 132/65 132/72  125/62  Pulse: 67 71  70  Temp:  97.8 F (36.6 C)  97.9 F (36.6 C)  TempSrc:  Oral  Oral  Resp: 16 17  18   Height:   5\' 7"  (1.702 m)   Weight:  79.4 kg (175 lb 0.7 oz)    SpO2: 90% 93%  93%    Wt Readings from Last 3 Encounters:  12/22/13 79.4 kg (175 lb 0.7 oz)  12/13/13 79.606 kg (175 lb 8 oz)  12/06/13 81.012 kg (178 lb 9.6 oz)     Intake/Output Summary (Last 24 hours) at 12/23/13 0853 Last data filed at 12/23/13 0810  Gross per 24 hour  Intake    240 ml  Output    915 ml  Net   -675 ml     Physical Exam  Awake Alert, Oriented X 3, No new F.N deficits, Normal affect [place].AT,PERRAL Supple Neck,No JVD, No cervical lymphadenopathy appriciated.  Symmetrical Chest wall movement, Good air movement bilaterally, CTAB RRR,No Gallops,Rubs or new  Murmurs, No Parasternal Heave +ve B.Sounds, Abd Soft, No tenderness, No organomegaly appriciated, No rebound - guarding or rigidity. No Cyanosis, Clubbing or edema, No new Rash or bruise      Data Review   Micro Results No results found for this or any previous visit (from the past 240 hour(s)).  Radiology Reports Dg Chest 2 View  12/22/2013   CLINICAL DATA:  Generalized weakness for 1 day  EXAM: CHEST  2 VIEW  COMPARISON:  06/21/2013  FINDINGS: Moderate cardiomegaly. Diffuse bilateral airspace and interstitial opacities have increased worrisome for pulmonary edema. Dual lead right subclavian pacemaker and leads are stable and intact. Postoperative change. No pneumothorax. Stable L1 compression fracture.  IMPRESSION: Interval development of pulmonary edema   Electronically Signed   By: Maryclare BeanArt  Hoss M.D.   On: 12/22/2013 20:17     CBC  Recent Labs Lab 12/22/13 1850 12/23/13 0446  WBC 3.3* 3.2*  HGB 8.7* 7.9*  HCT 27.0* 25.1*  PLT 110* 90*  MCV 86.8 87.2  MCH 28.0 27.4  MCHC 32.2 31.5  RDW 16.0* 16.1*  LYMPHSABS 0.4*  --   MONOABS 0.7  --   EOSABS 0.2  --   BASOSABS 0.0  --     Chemistries   Recent Labs Lab 12/22/13 1850 12/23/13 0446  NA 135* 138  K 4.2 3.7  CL 88* 91*  CO2 28 29  GLUCOSE 101* 93  BUN 102* 103*  CREATININE 3.98* 4.00*  CALCIUM 10.1 9.5  AST 28 22  ALT 16 14  ALKPHOS 76 69  BILITOT 0.6 0.6   ------------------------------------------------------------------------------------------------------------------ estimated creatinine clearance is 13.6 mL/min (by C-G formula based on Cr of 4). ------------------------------------------------------------------------------------------------------------------ No results for input(s): HGBA1C in the last 72 hours. ------------------------------------------------------------------------------------------------------------------ No results for input(s): CHOL, HDL, LDLCALC, TRIG, CHOLHDL, LDLDIRECT in the last 72  hours. ------------------------------------------------------------------------------------------------------------------  Recent Labs  12/23/13  TSH 8.820*   ------------------------------------------------------------------------------------------------------------------ No results for input(s): VITAMINB12, FOLATE, FERRITIN, TIBC, IRON, RETICCTPCT in the last 72 hours.  Coagulation profile  Recent Labs Lab 12/22/13 1850 12/23/13 0446  INR 2.71* 2.60*    No results for input(s): DDIMER in the last 72 hours.  Cardiac Enzymes  Recent Labs Lab 12/22/13 1850 12/23/13 12/23/13 0446  TROPONINI <0.30 <0.30 <0.30   ------------------------------------------------------------------------------------------------------------------ Invalid input(s): POCBNP     Time Spent in minutes  35   SINGH,PRASHANT K M.D on 12/23/2013 at 8:53 AM  Between 7am to 7pm - Pager -  [phone]  After 7pm go to [email or web address] - password [email or web address]  And look for the night coverage person covering for me after hours  [facility] Office  [phone]

## 2013-12-24 LAB — BASIC METABOLIC PANEL
ANION GAP: 18 — AB (ref 5–15)
BUN: 99 mg/dL — AB (ref 6–23)
CALCIUM: 9.8 mg/dL (ref 8.4–10.5)
CHLORIDE: 92 meq/L — AB (ref 96–112)
CO2: 28 meq/L (ref 19–32)
CREATININE: 3.8 mg/dL — AB (ref 0.50–1.35)
GFR calc Af Amer: 15 mL/min — ABNORMAL LOW (ref >90)
GFR calc non Af Amer: 13 mL/min — ABNORMAL LOW (ref >90)
GLUCOSE: 87 mg/dL (ref 70–99)
Potassium: 3.7 mEq/L (ref 3.7–5.3)
Sodium: 138 mEq/L (ref 137–147)

## 2013-12-24 LAB — PROTIME-INR
INR: 2.93 — AB (ref 0.00–1.49)
Prothrombin Time: 30.8 seconds — ABNORMAL HIGH (ref 11.6–15.2)

## 2013-12-24 LAB — HEMOGLOBIN A1C
Hgb A1c MFr Bld: 5.9 % — ABNORMAL HIGH (ref <5.7)
Mean Plasma Glucose: 123 mg/dL — ABNORMAL HIGH (ref <117)

## 2013-12-24 LAB — GLUCOSE, CAPILLARY: Glucose-Capillary: 98 mg/dL (ref 70–99)

## 2013-12-24 LAB — C4 COMPLEMENT: COMPLEMENT C4, BODY FLUID: 23 mg/dL (ref 10–40)

## 2013-12-24 LAB — C3 COMPLEMENT: C3 Complement: 77 mg/dL — ABNORMAL LOW (ref 90–180)

## 2013-12-24 MED ORDER — OXYCODONE HCL 5 MG PO TABS
ORAL_TABLET | ORAL | Status: AC
  Filled 2013-12-24: qty 1

## 2013-12-24 MED ORDER — LEVOTHYROXINE SODIUM 25 MCG PO TABS
25.0000 ug | ORAL_TABLET | Freq: Every day | ORAL | Status: DC
  Administered 2013-12-24 – 2013-12-25 (×2): 25 ug via ORAL
  Filled 2013-12-24 (×2): qty 1

## 2013-12-24 MED ORDER — SODIUM CHLORIDE 0.9 % IV SOLN
500.0000 mg | Freq: Every day | INTRAVENOUS | Status: AC
  Administered 2013-12-24 – 2013-12-25 (×2): 500 mg via INTRAVENOUS
  Filled 2013-12-24 (×3): qty 25

## 2013-12-24 MED ORDER — TAMSULOSIN HCL 0.4 MG PO CAPS
0.4000 mg | ORAL_CAPSULE | Freq: Every day | ORAL | Status: DC
  Administered 2013-12-24 – 2013-12-25 (×2): 0.4 mg via ORAL
  Filled 2013-12-24 (×2): qty 1

## 2013-12-24 NOTE — Plan of Care (Signed)
Problem: Phase I Progression Outcomes Goal: Pain controlled with appropriate interventions Outcome: Completed/Met Date Met:  12/24/13 Goal: OOB as tolerated unless otherwise ordered Outcome: Completed/Met Date Met:  12/24/13 Goal: Voiding-avoid urinary catheter unless indicated Outcome: Completed/Met Date Met:  12/24/13  Problem: Phase II Progression Outcomes Goal: Progress activity as tolerated unless otherwise ordered Outcome: Completed/Met Date Met:  12/24/13 Goal: Vital signs remain stable Outcome: Completed/Met Date Met:  12/24/13 Goal: IV changed to normal saline lock Outcome: Completed/Met Date Met:  12/24/13

## 2013-12-24 NOTE — Progress Notes (Addendum)
S: Feels better.  Breathing better O:BP 116/57 mmHg  Pulse 71  Temp(Src) 99.6 F (37.6 C) (Oral)  Resp 17  Ht 5\' 7"  (1.702 m)  Wt 80.1 kg (176 lb 9.4 oz)  BMI 27.65 kg/m2  SpO2 88%  Intake/Output Summary (Last 24 hours) at 12/24/13 [id number] Last data filed at 12/24/13 0820  Gross per 24 hour  Intake    720 ml  Output   2675 ml  Net  -1955 ml   Weight change: 0.7 kg (1 lb 8.7 oz) JYN:WGNFAGen:awake and alert CVS:RRR 3/6 systolic M Resp:few basilar crackles Abd:+ BS NTND Ext: tr edema NEURO:CNI Ox3 no asterixis   . amiodarone  400 mg Oral Daily  . calcitRIOL  0.25 mcg Oral QAC breakfast  . cycloSPORINE  1 drop Both Eyes BID  . docusate sodium  100 mg Oral BID  . finasteride  5 mg Oral Daily  . fish oil-omega-3 fatty acids  1 g Oral Daily  . folic acid  1 mg Oral Daily  . metoprolol  50 mg Oral QAC breakfast  . multivitamin with minerals  1 tablet Oral Daily  . rOPINIRole  2 mg Oral BID  . sodium chloride  3 mL Intravenous Q12H  . thiamine  100 mg Oral Daily  . vitamin B-12   Oral Daily  . [START ON 12/26/2013] warfarin  2.5 mg Oral Q Mon-1800  . warfarin  5 mg Oral Once per day on Sun Tue Wed Thu Fri Sat  . Warfarin - Pharmacist Dosing Inpatient   Does not apply q1800   Dg Chest 2 View  12/22/2013   CLINICAL DATA:  Generalized weakness for 1 day  EXAM: CHEST  2 VIEW  COMPARISON:  06/21/2013  FINDINGS: Moderate cardiomegaly. Diffuse bilateral airspace and interstitial opacities have increased worrisome for pulmonary edema. Dual lead right subclavian pacemaker and leads are stable and intact. Postoperative change. No pneumothorax. Stable L1 compression fracture.  IMPRESSION: Interval development of pulmonary edema   Electronically Signed   By: Maryclare BeanArt  Hoss M.D.   On: 12/22/2013 20:17   BMET    Component Value Date/Time   NA 138 12/24/2013 0658   K 3.7 12/24/2013 0658   CL 92* 12/24/2013 0658   CO2 28 12/24/2013 0658   GLUCOSE 87 12/24/2013 0658   BUN 99* 12/24/2013 0658    CREATININE 3.80* 12/24/2013 0658   CREATININE 3.33* 12/06/2013 1159   CALCIUM 9.8 12/24/2013 0658   GFRNONAA 13* 12/24/2013 0658   GFRAA 15* 12/24/2013 0658   CBC    Component Value Date/Time   WBC 3.2* 12/23/2013 0446   RBC 2.88* 12/23/2013 0446   HGB 7.9* 12/23/2013 0446   HCT 25.1* 12/23/2013 0446   PLT 90* 12/23/2013 0446   MCV 87.2 12/23/2013 0446   MCH 27.4 12/23/2013 0446   MCHC 31.5 12/23/2013 0446   RDW 16.1* 12/23/2013 0446   LYMPHSABS 0.4* 12/22/2013 1850   MONOABS 0.7 12/22/2013 1850   EOSABS 0.2 12/22/2013 1850   BASOSABS 0.0 12/22/2013 1850     Assessment: 1. Mild acute on CKD 4.  UO great and Scr sl lower 2. Anemia  Await iron levels 3. Sec HPTH on calcitriol 4. Hx lupus  Plan: 1. IV iron.  Spoke with pharmacy about ordering venofer as he is allergic to feraheme 2. Daily labs   Shanon Seawright T

## 2013-12-24 NOTE — Progress Notes (Signed)
Patient Demographics  Nathaniel Steele, is a 80 y.o. male, DOB - 06/06/1926, UJW:[id number]  Admit date - 12/22/2013   Admitting Physician Yevonne PaxSaadat A Khan, MD  Outpatient Primary MD for the patient is Delorse LekBURNETT,BRENT A, MD  LOS - 2   Chief Complaint  Patient presents with  . Weakness        Subjective:   Nathaniel Steele today has, No headache, No chest pain, No abdominal pain - No Nausea, No new weakness tingling or numbness, No Cough - SOB.   Assessment & Plan    1. Generalized weakness with dehydration and acute renal failure. Likely due to overdiuresis, does appear dehydrated, will obtain urine electrolytes, urine osmolality and serum osmolality, hold Lasix, nephrology following, renal function improved, PT evaluation. May require placement.   2. Paroxysmal atrial fibrillation. On comminution of amiodarone, metoprolol and Coumadin. Continue. Pharmacy monitoring INR. No acute issues. Coumadin being held for 2 days on 12/24/2013 due to hematuria.    3. Chronic diastolic CHF. EF 55%. Dehydrated, hold diuretic, continue rate control agents as above, monitor weight and clinically.    4. ARF on chronic kidney disease stage IV. Baseline creatinine 2, hold diuretic, post gentle hydration, urine electrolytes, monitor BMP.     5. Falls at home a few weeks ago. With left rib cage injury. PT eval. May require placement.    6.Hypothyroidism. Started on Synthroid    7. Petechial rash in both legs. Almost completely resolved, Likely due to Coumadin, C3 mildly low, C4 stable , Anti DNA  ordered on admission pending.    8. Foley-related trauma. Foley was placed in the ER for unclear reasons, he has some hematuria, placed on Flomax, flush Foley and try to discontinue, monitor postwar dressing tools, hold  Coumadin for 2 days.     Code Status: Full  Family Communication: None present  Disposition Plan: To be decided   Procedures recent echogram from November 2015 noted. EF 55-60%. Does have diastolic dysfunction.   Consults      Medications  Scheduled Meds: . amiodarone  400 mg Oral Daily  . calcitRIOL  0.25 mcg Oral QAC breakfast  . cycloSPORINE  1 drop Both Eyes BID  . docusate sodium  100 mg Oral BID  . finasteride  5 mg Oral Daily  . fish oil-omega-3 fatty acids  1 g Oral Daily  . folic acid  1 mg Oral Daily  . iron sucrose  500 mg Intravenous Daily  . metoprolol  50 mg Oral QAC breakfast  . multivitamin with minerals  1 tablet Oral Daily  . rOPINIRole  2 mg Oral BID  . sodium chloride  3 mL Intravenous Q12H  . tamsulosin  0.4 mg Oral Daily  . thiamine  100 mg Oral Daily  . vitamin B-12   Oral Daily  . [START ON 12/26/2013] warfarin  2.5 mg Oral Q Mon-1800  . warfarin  5 mg Oral Once per day on Sun Tue Wed Thu Fri Sat  . Warfarin - Pharmacist Dosing Inpatient   Does not apply q1800   Continuous Infusions:   PRN Meds:.acetaminophen **OR** acetaminophen, bisacodyl, ondansetron **OR** ondansetron (ZOFRAN) IV, oxyCODONE  DVT Prophylaxis  Coumadin-TEDs  Lab Results  Component Value Date   PLT 90*  12/23/2013   Lab Results  Component Value Date   INR 2.93* 12/24/2013   INR 2.60* 12/23/2013   INR 2.71* 12/22/2013     Antibiotics     Anti-infectives    None          Objective:   Filed Vitals:   12/23/13 1638 12/23/13 2051 12/24/13 0538 12/24/13 0927  BP: 117/69 113/64 116/57 117/53  Pulse: 70 71 71 71  Temp: 98.5 F (36.9 C) 99 F (37.2 C) 99.6 F (37.6 C) 98.8 F (37.1 C)  TempSrc: Oral Oral Oral Oral  Resp: 17 17 17 20   Height:  5\' 7"  (1.702 m)    Weight:  80.1 kg (176 lb 9.4 oz)    SpO2: 91% 90% 88% 92%    Wt Readings from Last 3 Encounters:  12/23/13 80.1 kg (176 lb 9.4 oz)  12/13/13 79.606 kg (175 lb 8 oz)  12/06/13 81.012 kg (178  lb 9.6 oz)     Intake/Output Summary (Last 24 hours) at 12/24/13 1016 Last data filed at 12/24/13 1610  Gross per 24 hour  Intake    720 ml  Output   2675 ml  Net  -1955 ml     Physical Exam  Awake Alert, Oriented X 3, No new F.N deficits, Normal affect [place].AT,PERRAL Supple Neck,No JVD, No cervical lymphadenopathy appriciated.  Symmetrical Chest wall movement, Good air movement bilaterally, CTAB RRR,No Gallops,Rubs or new Murmurs, No Parasternal Heave +ve B.Sounds, Abd Soft, No tenderness, No organomegaly appriciated, No rebound - guarding or rigidity. No Cyanosis, Clubbing or edema, No new Rash or bruise      Data Review   Micro Results No results found for this or any previous visit (from the past 240 hour(s)).  Radiology Reports Dg Chest 2 View  12/22/2013   CLINICAL DATA:  Generalized weakness for 1 day  EXAM: CHEST  2 VIEW  COMPARISON:  06/21/2013  FINDINGS: Moderate cardiomegaly. Diffuse bilateral airspace and interstitial opacities have increased worrisome for pulmonary edema. Dual lead right subclavian pacemaker and leads are stable and intact. Postoperative change. No pneumothorax. Stable L1 compression fracture.  IMPRESSION: Interval development of pulmonary edema   Electronically Signed   By: Maryclare Bean M.D.   On: 12/22/2013 20:17     CBC  Recent Labs Lab 12/22/13 1850 12/23/13 0446  WBC 3.3* 3.2*  HGB 8.7* 7.9*  HCT 27.0* 25.1*  PLT 110* 90*  MCV 86.8 87.2  MCH 28.0 27.4  MCHC 32.2 31.5  RDW 16.0* 16.1*  LYMPHSABS 0.4*  --   MONOABS 0.7  --   EOSABS 0.2  --   BASOSABS 0.0  --     Chemistries   Recent Labs Lab 12/22/13 1850 12/23/13 0446 12/24/13 0658  NA 135* 138 138  K 4.2 3.7 3.7  CL 88* 91* 92*  CO2 28 29 28   GLUCOSE 101* 93 87  BUN 102* 103* 99*  CREATININE 3.98* 4.00* 3.80*  CALCIUM 10.1 9.5 9.8  AST 28 22  --   ALT 16 14  --   ALKPHOS 76 69  --   BILITOT 0.6 0.6  --     ------------------------------------------------------------------------------------------------------------------ estimated creatinine clearance is 14.4 mL/min (by C-G formula based on Cr of 3.8). ------------------------------------------------------------------------------------------------------------------  Recent Labs  12/23/13  HGBA1C 5.9*   ------------------------------------------------------------------------------------------------------------------ No results for input(s): CHOL, HDL, LDLCALC, TRIG, CHOLHDL, LDLDIRECT in the last 72 hours. ------------------------------------------------------------------------------------------------------------------  Recent Labs  12/23/13 12/23/13 0852  TSH 8.820*  --   T4TOTAL  --  4.5*  T3FREE  --  2.1*   ------------------------------------------------------------------------------------------------------------------  Recent Labs  12/23/13  FERRITIN 245  TIBC 283  IRON 40*    Coagulation profile  Recent Labs Lab 12/22/13 1850 12/23/13 0446 12/24/13 0658  INR 2.71* 2.60* 2.93*    No results for input(s): DDIMER in the last 72 hours.  Cardiac Enzymes  Recent Labs Lab 12/23/13 12/23/13 0446 12/23/13 1017  TROPONINI <0.30 <0.30 <0.30   ------------------------------------------------------------------------------------------------------------------ Invalid input(s): POCBNP     Time Spent in minutes  35   Miel Wisener K M.D on 12/24/2013 at 10:16 AM  Between 7am to 7pm - Pager - [phone]  After 7pm go to [email or web address] - password [email or web address]  And look for the night coverage person covering for me after hours  [facility] Office  [phone]

## 2013-12-25 DIAGNOSIS — I509 Heart failure, unspecified: Secondary | ICD-10-CM

## 2013-12-25 DIAGNOSIS — N179 Acute kidney failure, unspecified: Secondary | ICD-10-CM | POA: Insufficient documentation

## 2013-12-25 LAB — RENAL FUNCTION PANEL
ANION GAP: 16 — AB (ref 5–15)
Albumin: 3.3 g/dL — ABNORMAL LOW (ref 3.5–5.2)
BUN: 97 mg/dL — ABNORMAL HIGH (ref 6–23)
CHLORIDE: 94 meq/L — AB (ref 96–112)
CO2: 28 meq/L (ref 19–32)
Calcium: 9.6 mg/dL (ref 8.4–10.5)
Creatinine, Ser: 3.72 mg/dL — ABNORMAL HIGH (ref 0.50–1.35)
GFR calc non Af Amer: 14 mL/min — ABNORMAL LOW (ref >90)
GFR, EST AFRICAN AMERICAN: 16 mL/min — AB (ref >90)
Glucose, Bld: 89 mg/dL (ref 70–99)
POTASSIUM: 3.4 meq/L — AB (ref 3.7–5.3)
Phosphorus: 5.1 mg/dL — ABNORMAL HIGH (ref 2.3–4.6)
SODIUM: 138 meq/L (ref 137–147)

## 2013-12-25 LAB — GLUCOSE, CAPILLARY: GLUCOSE-CAPILLARY: 87 mg/dL (ref 70–99)

## 2013-12-25 LAB — CBC
HCT: 25 % — ABNORMAL LOW (ref 39.0–52.0)
HEMOGLOBIN: 7.9 g/dL — AB (ref 13.0–17.0)
MCH: 27.6 pg (ref 26.0–34.0)
MCHC: 31.6 g/dL (ref 30.0–36.0)
MCV: 87.4 fL (ref 78.0–100.0)
Platelets: 87 10*3/uL — ABNORMAL LOW (ref 150–400)
RBC: 2.86 MIL/uL — AB (ref 4.22–5.81)
RDW: 15.9 % — ABNORMAL HIGH (ref 11.5–15.5)
WBC: 4.2 10*3/uL (ref 4.0–10.5)

## 2013-12-25 LAB — PROTIME-INR
INR: 2.93 — ABNORMAL HIGH (ref 0.00–1.49)
Prothrombin Time: 30.8 seconds — ABNORMAL HIGH (ref 11.6–15.2)

## 2013-12-25 MED ORDER — CYANOCOBALAMIN 500 MCG PO TABS
500.0000 ug | ORAL_TABLET | Freq: Every day | ORAL | Status: DC
  Administered 2013-12-25: 500 ug via ORAL
  Filled 2013-12-25: qty 1

## 2013-12-25 MED ORDER — POTASSIUM CHLORIDE CRYS ER 20 MEQ PO TBCR
40.0000 meq | EXTENDED_RELEASE_TABLET | Freq: Once | ORAL | Status: AC
  Administered 2013-12-25: 40 meq via ORAL
  Filled 2013-12-25: qty 2

## 2013-12-25 MED ORDER — FUROSEMIDE 40 MG PO TABS: 80.0000 mg | ORAL_TABLET | Freq: Two times a day (BID) | ORAL | Status: DC

## 2013-12-25 NOTE — Progress Notes (Signed)
S: Feels better.  Breathing better.  Wants to go home O:BP 121/61 mmHg  Pulse 70  Temp(Src) 97.8 F (36.6 C) (Oral)  Resp 18  Ht 5\' 8"  (1.727 m)  Wt 78.7 kg (173 lb 8 oz)  BMI 26.39 kg/m2  SpO2 91%  Intake/Output Summary (Last 24 hours) at 12/25/13 0848 Last data filed at 12/25/13 0754  Gross per 24 hour  Intake    515 ml  Output   2525 ml  Net  -2010 ml   Weight change: -1.4 kg (-3 lb 1.4 oz) ZOX:WRUEAGen:awake and alert CVS:RRR 3/6 systolic M Resp: clear Abd:+ BS NTND Ext: tr edema NEURO:CNI Ox3 no asterixis   . amiodarone  400 mg Oral Daily  . calcitRIOL  0.25 mcg Oral QAC breakfast  . cycloSPORINE  1 drop Both Eyes BID  . docusate sodium  100 mg Oral BID  . finasteride  5 mg Oral Daily  . fish oil-omega-3 fatty acids  1 g Oral Daily  . folic acid  1 mg Oral Daily  . iron sucrose  500 mg Intravenous Daily  . levothyroxine  25 mcg Oral QAC breakfast  . metoprolol  50 mg Oral QAC breakfast  . multivitamin with minerals  1 tablet Oral Daily  . potassium chloride  40 mEq Oral Once  . rOPINIRole  2 mg Oral BID  . sodium chloride  3 mL Intravenous Q12H  . tamsulosin  0.4 mg Oral Daily  . thiamine  100 mg Oral Daily  . vitamin B-12   Oral Daily  . Warfarin - Pharmacist Dosing Inpatient   Does not apply q1800   No results found. BMET    Component Value Date/Time   NA 138 12/25/2013 0553   K 3.4* 12/25/2013 0553   CL 94* 12/25/2013 0553   CO2 28 12/25/2013 0553   GLUCOSE 89 12/25/2013 0553   BUN 97* 12/25/2013 0553   CREATININE 3.72* 12/25/2013 0553   CREATININE 3.33* 12/06/2013 1159   CALCIUM 9.6 12/25/2013 0553   GFRNONAA 14* 12/25/2013 0553   GFRAA 16* 12/25/2013 0553   CBC    Component Value Date/Time   WBC 4.2 12/25/2013 0553   RBC 2.86* 12/25/2013 0553   HGB 7.9* 12/25/2013 0553   HCT 25.0* 12/25/2013 0553   PLT 87* 12/25/2013 0553   MCV 87.4 12/25/2013 0553   MCH 27.6 12/25/2013 0553   MCHC 31.6 12/25/2013 0553   RDW 15.9* 12/25/2013 0553   LYMPHSABS  0.4* 12/22/2013 1850   MONOABS 0.7 12/22/2013 1850   EOSABS 0.2 12/22/2013 1850   BASOSABS 0.0 12/22/2013 1850     Assessment: 1. Mild acute on CKD 4.  UO great and Scr sl lower 2. Anemia with Fe def, getting venofer 3. Sec HPTH on calcitriol 4. Hx lupus  Plan: 1. OK to go from renal standpoint.  Dc on lasix 80mg  BID and he can increase to 160mg  BID if needed    Hridhaan Yohn T

## 2013-12-25 NOTE — Evaluation (Signed)
Physical Therapy Evaluation Patient Details Name: Nathaniel Steele MRN: [id number] DOB: September 30, 1926 Today's Date: 12/25/2013   History of Present Illness  HPI: Nathaniel Steele is a 80 y.o. male presents with weakness. Patient states that he has noted increased fatigue and has been feeling very weak. He states that he feels like when he has been anemic in the past. He does have a baseline renal failure and his last creatinine was 3.3 in November. n addition he was noted on the CXR to have some increased interstitial densities noted read as pulmonary edema. Patient does have a significant petechial rash noted on his lower extremities and he states that he has been seen at the [facility] for this. Patient also has a diagnosis of SLE mentioned in his medical record. Other medical issues include chronic anticoagulation for atrial fibrillation and also has a history of pacer placement for a sick sinus syndrome.  Past Medical History  Diagnosis Date  . Shortness of breath with exertion  . Chronic kidney disease     not on dialysis yet  . Coronary artery disease 05/14/2010    stress test - no scintigraphic evidence of inducible myocardial ischemia,; normal study  . Hypertension   . Arthritis   . Blood dyscrasia     one time had low platlet count  . Dysrhythmia     atrial fib  . SSS (sick sinus syndrome) 08/27/2011    echo EF >55%, severe LAE, moderate RAE, tissue AVR w/ gradients 35 and 77mmHg  . Pericardial effusion 03/12/2011    echo EF 55-60%  . Claudication 02/19/2012    LE doppler no evidence of arterial insufficiency, lower extremities demonstrate normal values  w/ no evidence of insufficiency  . Lupus nephritis   . Pacemaker 09/19/2004    implanted  . Restless leg syndrome   . Cellulitis   . Anemia   . Benign prostatic hypertrophy   . Constipation   . Shoulder joint pain   . Acute on chronic diastolic CHF (congestive heart failure), NYHA class 3 12/07/2013    has past surgical history that includes  Back surgery; Appendectomy; Hernia repair; Pacemaker insertion (2006); AV fistula placement (02/24/2011); Aortic valve replacement (02/24/2011); Coronary artery bypass graft (02/24/2011); Cardiac catheterization (02/13/2011); Lumbar laminectomy/decompression microdiscectomy (Right, 04/16/2012); Esophagogastroduodenoscopy (N/A, 06/21/2013); Givens capsule study (N/A, 06/21/2013); and Cardioversion (N/A, 12/09/2013).   Clinical Impression   Patient evaluated by Physical Therapy with no further acute PT needs identified. All education has been completed and the patient has no further questions.  See below for any follow-up Physical Therapy or equipment needs. PT is signing off. Thank you for this referral.     Follow Up Recommendations Home health PT;Supervision - Intermittent    Equipment Recommendations  Other (comment) (pt has multiple RWs and canes)    Recommendations for Other Services       Precautions / Restrictions Precautions Precautions: None      Mobility  Bed Mobility                  Transfers Overall transfer level: Modified independent Equipment used: Rolling walker (2 wheeled)                Ambulation/Gait Ambulation/Gait assistance: Supervision;Min assist Ambulation Distance (Feet): 200 Feet Assistive device: Rolling walker (2 wheeled);1 person hand held assist (and pushing IV pole) Gait Pattern/deviations: Step-through pattern     General Gait Details: Overall managing well; recommend pt use his own cane/RW at home as noted he  is dependent on at least unilateral UE support for balance  Stairs            Wheelchair Mobility    Modified Rankin (Stroke Patients Only)       Balance Overall balance assessment: Needs assistance           Standing balance-Leahy Scale: Fair Standing balance comment: Benefits from uni or bilateral UE support for balance                             Pertinent Vitals/Pain Pain Assessment: No/denies  pain    Home Living Family/patient expects to be discharged to:: Private residence Living Arrangements: Spouse/significant other;Children Available Help at Discharge: Family;Available 24 hours/day Type of Home: House Home Access: Level entry     Home Layout: Two level Home Equipment: Walker - 2 wheels;Cane - single point;Tub bench Additional Comments: Walks down to basement to use walk-in shower as he cannot step into the tub shower on the main level. Has a tub bench but does not like using it.     Prior Function Level of Independence: Independent         Comments: pt gardens; rides tractors      Hand Dominance   Dominant Hand: Right    Extremity/Trunk Assessment   Upper Extremity Assessment: Overall WFL for tasks assessed           Lower Extremity Assessment: Overall WFL for tasks assessed         Communication   Communication: HOH  Cognition Arousal/Alertness: Awake/alert Behavior During Therapy: WFL for tasks assessed/performed Overall Cognitive Status: Within Functional Limits for tasks assessed                      General Comments      Exercises        Assessment/Plan    PT Assessment All further PT needs can be met in the next venue of care  PT Diagnosis Difficulty walking;Generalized weakness   PT Problem List Decreased strength;Decreased activity tolerance;Decreased balance;Decreased mobility;Decreased coordination;Decreased knowledge of use of DME  PT Treatment Interventions     PT Goals (Current goals can be found in the Care Plan section) Acute Rehab PT Goals Patient Stated Goal: wants to get home PT Goal Formulation: All assessment and education complete, DC therapy    Frequency     Barriers to discharge        Co-evaluation               End of Session Equipment Utilized During Treatment: Gait belt Activity Tolerance: Patient tolerated treatment well Patient left: in chair;with call bell/phone within reach;Other  (comment) (RN Case Mgr in room) Nurse Communication: Mobility status         Time: [phone] PT Time Calculation (min) (ACUTE ONLY): 17 min   Charges:   PT Evaluation $Initial PT Evaluation Tier I: 1 Procedure PT Treatments $Gait Training: 8-22 mins   PT G Codes:          Roney Marion Hamff 12/25/2013, 1:18 PM  Roney Marion, [place]  Acute Rehabilitation Services Pager [phone] Office [phone]

## 2013-12-25 NOTE — Care Management Note (Signed)
    Page 1 of 2   12/25/2013     12:16:58 PM CARE MANAGEMENT NOTE 12/25/2013  Patient:  Nathaniel Steele, Nathaniel Steele   Account Number:  [id number]  Date Initiated:  12/25/2013  Documentation initiated by:  [email or web address],VPLW  Subjective/Objective Assessment:   adm:  presents with weakness     Action/Plan:   discharge planning   Anticipated DC Date:  12/25/2013   Anticipated DC Plan:  Nathaniel Steele  CM consult      [facility] Choice  HOME HEALTH   Choice offered to / List presented to:  C-1 Patient        [place] arranged  HH-1 RN  [place].   Status of service:  Completed, signed off Medicare Important Message given?   (If response is "NO", the following Medicare IM given date fields will be blank) Date Medicare IM given:   Medicare IM given by:   Date Additional Medicare IM given:   Additional Medicare IM given by:    Discharge Disposition:  [place]  Per UR Regulation:    If discussed at Long Length of Stay Meetings, dates discussed:    Comments:  12.6.2015   11.45am   Case Manager met patient and patients wife at bedside.Discharge plan discussed with patient.Home health orders Received for RN / PT/aide.Patient provided with [facility] choice list and elects [facility].Patients demographics best Contact number and address verified in EPIC.CM contacted [facility] and verbalized [place] orders.No new CM needs at this Time patient and wife verbalized understanding of [facility] plan.Nathaniel Arn RN/BSN Mariane Masters, [place], [place].

## 2013-12-25 NOTE — Discharge Summary (Signed)
Nathaniel Steele, is a 80 y.o. male  DOB 05/29/1926  MRN [id number].  Admission date:  12/22/2013  Admitting Physician  Yevonne PaxSaadat A Khan, MD  Discharge Date:  12/25/2013   Primary MD  Delorse LekBURNETT,BRENT A, MD  Recommendations for primary care physician for things to follow:   Check CBC, BMP, INR in 2-3 days   Admission Diagnosis  low hemoglobin   Discharge Diagnosis  low hemoglobin     Principal Problem:   ARF (acute renal failure) Active Problems:   PAF (paroxysmal atrial fibrillation)   Chronic anticoagulation, on Coumadin prior to admission   RLS (restless legs syndrome)   CKD (chronic kidney disease) stage 4, GFR 15-29 ml/min   CKD (chronic kidney disease) stage 5, GFR less than 15 ml/min   Acute renal failure syndrome   CHF exacerbation      Past Medical History  Diagnosis Date  . Shortness of breath with exertion  . Chronic kidney disease     not on dialysis yet  . Coronary artery disease 05/14/2010    stress test - no scintigraphic evidence of inducible myocardial ischemia,; normal study  . Hypertension   . Arthritis   . Blood dyscrasia     one time had low platlet count  . Dysrhythmia     atrial fib  . SSS (sick sinus syndrome) 08/27/2011    echo EF >55%, severe LAE, moderate RAE, tissue AVR w/ gradients 35 and 17mmHg  . Pericardial effusion 03/12/2011    echo EF 55-60%  . Claudication 02/19/2012    LE doppler no evidence of arterial insufficiency, lower extremities demonstrate normal values  w/ no evidence of insufficiency  . Lupus nephritis   . Pacemaker 09/19/2004    implanted  . Restless leg syndrome   . Cellulitis   . Anemia   . Benign prostatic hypertrophy   . Constipation   . Shoulder joint pain   . Acute on chronic diastolic CHF (congestive heart failure), NYHA class 3 12/07/2013    Past Surgical  History  Procedure Laterality Date  . Back surgery    . Appendectomy    . Hernia repair    . Pacemaker insertion  2006    [employer] EnRhythm  . Av fistula placement  02/24/2011  . Aortic valve replacement  02/24/2011    pericardial tissue valve [facility]  . Coronary artery bypass graft  02/24/2011    LIMA to LAD, SVG to distal RCA  . Cardiac catheterization  02/13/2011    mod/severe aortic valve stenosis w peak to peak gradient 25-32 mmHg,  70% stenosis LAD, 30-40%proximal followed by 90% sstenosis in RCA prior to anterior RV margin branch  . Lumbar laminectomy/decompression microdiscectomy Right 04/16/2012    Procedure: DECOMPRESSIVE L4 - L5/ MICRODISCECTOMY ON THE RIGHT 1 LEVEL;  Surgeon: Jacki Conesonald A Gioffre, MD;  Location: [facility] ORS;  Service: Orthopedics;  Laterality: Right;  . Esophagogastroduodenoscopy N/A 06/21/2013    Procedure: ESOPHAGOGASTRODUODENOSCOPY (EGD);  Surgeon: Theda BelfastPatrick D Hung, MD;  [facility];  Service: Endoscopy;  Laterality: N/A;  . Givens capsule study N/A 06/21/2013    Procedure: GIVENS CAPSULE STUDY;  Surgeon: Theda Belfast, MD;  Location: [facility] ENDOSCOPY;  Service: Endoscopy;  Laterality: N/A;  . Cardioversion N/A 12/09/2013    Procedure: CARDIOVERSION;  Surgeon: Thurmon Fair, MD;  Location: [facility] ENDOSCOPY;  Service: Cardiovascular;  Laterality: N/A;       History of present illness and  Hospital Course:     Kindly see H&P for history of present illness and admission details, please review complete Labs, Consult reports and Test reports for all details in brief  HPI  from the history and physical done on the day of admission  Nathaniel Steele is a 80 y.o. male presents with weakness. Patient states that he has noted increased fatigue and has been feeling very weak. He states that he feels like when he has been anemic in the past. He has no chest pain noted. He has been no more short of breath than his baseline. Patient states that he has no cough or  congestion. He does have a baseline renal failure and his last creatinine was 3.3 in November. Steele in the ED he was noted to have a creatinine of 3.98. In addition he was noted on the CXR to have some increased interstitial densities noted read as pulmonary edema. Patient does have a significant petechial rash noted on his lower extremities and he states that he has been seen at the [facility] for this. Patient also has a diagnosis of SLE mentioned in his medical record. Other medical issues include chronic anticoagulation for atrial fibrillation and also has a history of pacer placement for a sick sinus syndrome.    Hospital Course    1. Generalized weakness with dehydration and acute renal failure. Likely due to overdiuresis, renal function has stabilized, seen by Renal lasix dose reduced . K replaced Steele. D/W Dr Briant Cedar ok for DC at Lasix 80mg  BID.   2. Paroxysmal atrial fibrillation. On comminution of amiodarone, metoprolol and Coumadin. Continue. Pharmacy monitoring INR. No acute issues. Coumadin being held for 2 days on 12/24/2013 due to hematuria.    3. Chronic diastolic CHF. EF 55%. Stable..    4. ARF on chronic kidney disease stage IV. Baseline creatinine 2, as in #1, monitor BMP.    5. Falls at home a few weeks ago. Will order HHPT, ambulated here well.       Discharge Condition: Stable   Follow UP  Follow-up Information    Follow up with BURNETT,BRENT A, MD. Schedule an appointment as soon as possible for a visit in 3 days.   Specialty:  Family Medicine   Contact information:   [address] [place] [address] [phone]         Discharge Instructions  and  Discharge Medications      Discharge Instructions    Diet - low sodium heart healthy    Complete by:  As directed      Discharge instructions    Complete by:  As directed   Follow with Primary MD BURNETT,BRENT A, MD in 3 days   Get CBC, CMP, INR, 2 view Chest X ray checked  by  Primary MD next visit.    Activity: As tolerated with Full fall precautions use walker/cane & assistance as needed   Disposition Home     Diet: Heart Healthy . Check your Weight same time everyday, if you gain over 2 pounds, or you  develop in leg swelling, experience more shortness of breath or chest pain, call your Primary MD immediately. Follow Cardiac Low Salt Diet and 1.5 lit/day fluid restriction.   On your next visit with your primary care physician please Get Medicines reviewed and adjusted.   Please request your Prim.MD to go over all Hospital Tests and Procedure/Radiological results at the follow up, please get all Hospital records sent to your Prim MD by signing hospital release before you go home.   If you experience worsening of your admission symptoms, develop shortness of breath, life threatening emergency, suicidal or homicidal thoughts you must seek medical attention immediately by calling 911 or calling your MD immediately  if symptoms less severe.  You Must read complete instructions/literature along with all the possible adverse reactions/side effects for all the Medicines you take and that have been prescribed to you. Take any new Medicines after you have completely understood and accpet all the possible adverse reactions/side effects.   Do not drive, operating heavy machinery, perform activities at heights, swimming or participation in water activities or provide baby sitting services if your were admitted for syncope or siezures until you have seen by Primary MD or a Neurologist and advised to do so again.  Do not drive when taking Pain medications.    Do not take more than prescribed Pain, Sleep and Anxiety Medications  Special Instructions: If you have smoked or chewed Tobacco  in the last 2 yrs please stop smoking, stop any regular Alcohol  and or any Recreational drug use.  Wear Seat belts while driving.   Please note  You were cared for by a hospitalist  during your hospital stay. If you have any questions about your discharge medications or the care you received while you were in the hospital after you are discharged, you can call the unit and asked to speak with the hospitalist on call if the hospitalist that took care of you is not available. Once you are discharged, your primary care physician will handle any further medical issues. Please note that NO REFILLS for any discharge medications will be authorized once you are discharged, as it is imperative that you return to your primary care physician (or establish a relationship with a primary care physician if you do not have one) for your aftercare needs so that they can reassess your need for medications and monitor your lab values.     Increase activity slowly    Complete by:  As directed             Medication List    STOP taking these medications        metolazone 2.5 MG tablet  Commonly known as:  ZAROXOLYN      TAKE these medications        amiodarone 200 MG tablet  Commonly known as:  PACERONE  Take 2 tablets (400 mg total) by mouth daily.     aspirin EC 81 MG tablet  Take 81 mg by mouth daily.     calcitRIOL 0.25 MCG capsule  Commonly known as:  ROCALTROL  Take 0.25 mcg by mouth daily before breakfast.     calcium acetate 667 MG capsule  Commonly known as:  PHOSLO  Take 667 mg by mouth 3 (three) times daily with meals.     cycloSPORINE 0.05 % ophthalmic emulsion  Commonly known as:  RESTASIS  Place 1 drop into both eyes 2 (two) times daily.     finasteride 5 MG tablet  Commonly known as:  PROSCAR  Take 5 mg by mouth daily.     fish oil-omega-3 fatty acids 1000 MG capsule  Take 1 g by mouth daily.     furosemide 40 MG tablet  Commonly known as:  LASIX  Take 2 tablets (80 mg total) by mouth 2 (two) times daily. Takes 4 tablets     metoprolol 50 MG tablet  Commonly known as:  LOPRESSOR  Take 50 mg by mouth daily before breakfast.     OCUVITE PRESERVISION  PO  Take 1 tablet by mouth daily.     pantoprazole 40 MG tablet  Commonly known as:  PROTONIX  Take 1 tablet (40 mg total) by mouth 2 (two) times daily.     rOPINIRole 2 MG tablet  Commonly known as:  REQUIP  Take 1 tablet (2 mg total) by mouth 2 (two) times daily.     VITAMIN B 12 PO  Take 1 tablet by mouth daily.     warfarin 5 MG tablet  Commonly known as:  COUMADIN  Take 1 tablet (5 mg total) by mouth daily.          Diet and Activity recommendation: See Discharge Instructions above   Consults obtained - renal   Major procedures and Radiology Reports - PLEASE review detailed and final reports for all details, in brief -       Dg Chest 2 View  12/22/2013   CLINICAL DATA:  Generalized weakness for 1 day  EXAM: CHEST  2 VIEW  COMPARISON:  06/21/2013  FINDINGS: Moderate cardiomegaly. Diffuse bilateral airspace and interstitial opacities have increased worrisome for pulmonary edema. Dual lead right subclavian pacemaker and leads are stable and intact. Postoperative change. No pneumothorax. Stable L1 compression fracture.  IMPRESSION: Interval development of pulmonary edema   Electronically Signed   By: Maryclare Bean M.D.   On: 12/22/2013 20:17    Micro Results      No results found for this or any previous visit (from the past 240 hour(s)).     Steele   Subjective:   Nathaniel Steele has no headache,no chest abdominal pain,no new weakness tingling or numbness, feels much better wants to go home Steele.    Objective:   Blood pressure 107/57, pulse 72, temperature 97.8 F (36.6 C), temperature source Oral, resp. rate 20, height 5\' 8"  (1.727 m), weight 78.7 kg (173 lb 8 oz), SpO2 96 %.   Intake/Output Summary (Last 24 hours) at 12/25/13 1004 Last data filed at 12/25/13 0941  Gross per 24 hour  Intake    515 ml  Output   2525 ml  Net  -2010 ml    Exam Awake Alert, Oriented x 3, No new F.N deficits, Normal affect [place].AT,PERRAL Supple Neck,No JVD, No cervical  lymphadenopathy appriciated.  Symmetrical Chest wall movement, Good air movement bilaterally, CTAB RRR,No Gallops,Rubs or new Murmurs, No Parasternal Heave +ve B.Sounds, Abd Soft, Non tender, No organomegaly appriciated, No rebound -guarding or rigidity. No Cyanosis, Clubbing or edema, No new Rash or bruise  Data Review   CBC w Diff: Lab Results  Component Value Date   WBC 4.2 12/25/2013   HGB 7.9* 12/25/2013   HCT 25.0* 12/25/2013   PLT 87* 12/25/2013   LYMPHOPCT 12 12/22/2013   MONOPCT 23* 12/22/2013   EOSPCT 6* 12/22/2013   BASOPCT 1 12/22/2013    CMP: Lab Results  Component Value Date   NA 138 12/25/2013   K 3.4* 12/25/2013   CL 94*  12/25/2013   CO2 28 12/25/2013   BUN 97* 12/25/2013   CREATININE 3.72* 12/25/2013   CREATININE 3.33* 12/06/2013   PROT 6.4 12/23/2013   PROT 6.4 12/01/2012   ALBUMIN 3.3* 12/25/2013   BILITOT 0.6 12/23/2013   ALKPHOS 69 12/23/2013   AST 22 12/23/2013   ALT 14 12/23/2013  . Lab Results  Component Value Date   INR 2.93* 12/25/2013   INR 2.93* 12/24/2013   INR 2.60* 12/23/2013     Total Time in preparing paper work, data evaluation and todays exam - 35 minutes  Leroy SeaSINGH,Katheren Jimmerson K M.D on 12/25/2013 at 10:04 AM  [facility] Office  [phone]

## 2013-12-25 NOTE — Discharge Instructions (Signed)
Follow with Primary MD BURNETT,BRENT A, MD in 3 days   Get CBC, CMP, INR, 2 view Chest X ray checked  by Primary MD next visit.    Activity: As tolerated with Full fall precautions use walker/cane & assistance as needed   Disposition Home     Diet: Heart Healthy . Check your Weight same time everyday, if you gain over 2 pounds, or you develop in leg swelling, experience more shortness of breath or chest pain, call your Primary MD immediately. Follow Cardiac Low Salt Diet and 1.5 lit/day fluid restriction.   On your next visit with your primary care physician please Get Medicines reviewed and adjusted.   Please request your Prim.MD to go over all Hospital Tests and Procedure/Radiological results at the follow up, please get all Hospital records sent to your Prim MD by signing hospital release before you go home.   If you experience worsening of your admission symptoms, develop shortness of breath, life threatening emergency, suicidal or homicidal thoughts you must seek medical attention immediately by calling 911 or calling your MD immediately  if symptoms less severe.  You Must read complete instructions/literature along with all the possible adverse reactions/side effects for all the Medicines you take and that have been prescribed to you. Take any new Medicines after you have completely understood and accpet all the possible adverse reactions/side effects.   Do not drive, operating heavy machinery, perform activities at heights, swimming or participation in water activities or provide baby sitting services if your were admitted for syncope or siezures until you have seen by Primary MD or a Neurologist and advised to do so again.  Do not drive when taking Pain medications.    Do not take more than prescribed Pain, Sleep and Anxiety Medications  Special Instructions: If you have smoked or chewed Tobacco  in the last 2 yrs please stop smoking, stop any regular Alcohol  and or any  Recreational drug use.  Wear Seat belts while driving.   Please note  You were cared for by a hospitalist during your hospital stay. If you have any questions about your discharge medications or the care you received while you were in the hospital after you are discharged, you can call the unit and asked to speak with the hospitalist on call if the hospitalist that took care of you is not available. Once you are discharged, your primary care physician will handle any further medical issues. Please note that NO REFILLS for any discharge medications will be authorized once you are discharged, as it is imperative that you return to your primary care physician (or establish a relationship with a primary care physician if you do not have one) for your aftercare needs so that they can reassess your need for medications and monitor your lab values.

## 2013-12-25 NOTE — Progress Notes (Signed)
UR Completed.  [phone]  

## 2013-12-25 NOTE — Progress Notes (Signed)
Pt. Discharged home with wife and son. Discharge instructions given. Pt and wife verbalized signs and symptoms of worsening condition and when to call the doctor. Follow-up appointments and medications discussed. Pt hemodynamically stable. Home care set up by case management.   Ok AnisSanders,Mikale Silversmith A, RN 12/25/2013 2:02 PM

## 2013-12-26 LAB — PARATHYROID HORMONE, INTACT (NO CA): PTH: 156 pg/mL — ABNORMAL HIGH (ref 14–64)

## 2013-12-26 LAB — ANTI-DNA ANTIBODY, DOUBLE-STRANDED

## 2013-12-29 ENCOUNTER — Encounter (HOSPITAL_COMMUNITY): Payer: Self-pay | Admitting: Cardiovascular Disease

## 2014-01-03 ENCOUNTER — Encounter (HOSPITAL_COMMUNITY)
Admission: RE | Admit: 2014-01-03 | Discharge: 2014-01-03 | Disposition: A | Discharge status: Home / Self Care | Payer: Medicare Other | Source: Ambulatory Visit | Attending: Chiropractic Medicine | Admitting: Chiropractic Medicine

## 2014-01-03 ENCOUNTER — Telehealth: Payer: Self-pay | Admitting: Cardiovascular Disease

## 2014-01-03 DIAGNOSIS — N184 Chronic kidney disease, stage 4 (severe): Secondary | ICD-10-CM | POA: Insufficient documentation

## 2014-01-03 DIAGNOSIS — D638 Anemia in other chronic diseases classified elsewhere: Secondary | ICD-10-CM | POA: Diagnosis not present

## 2014-01-03 LAB — HEMOGLOBIN AND HEMATOCRIT, BLOOD
HCT: 27 % — ABNORMAL LOW (ref 39.0–52.0)
HEMOGLOBIN: 8.4 g/dL — AB (ref 13.0–17.0)

## 2014-01-03 LAB — PROTIME-INR: INR: 2.7 — AB (ref 0.9–1.1)

## 2014-01-03 MED ORDER — DARBEPOETIN ALFA 100 MCG/0.5ML IJ SOSY
PREFILLED_SYRINGE | INTRAMUSCULAR | Status: AC
  Filled 2014-01-03: qty 1

## 2014-01-03 MED ORDER — DARBEPOETIN ALFA 100 MCG/0.5ML IJ SOSY
200.0000 ug | PREFILLED_SYRINGE | Freq: Once | INTRAMUSCULAR | Status: AC
  Administered 2014-01-03: 200 ug via SUBCUTANEOUS

## 2014-01-03 MED ORDER — DARBEPOETIN ALFA 200 MCG/0.4ML IJ SOSY
200.0000 ug | PREFILLED_SYRINGE | Freq: Once | INTRAMUSCULAR | Status: DC
  Filled 2014-01-03: qty 0.4

## 2014-01-03 NOTE — Progress Notes (Signed)
Here for labs and aranesp if indicated. Dx CKD stage 4, anemia. aranesp information printed and given to pt and wife. Arrived with small area of broken skin to right thumb. No active bleeding. Area cleansed with saline and small amt of vaseline applied to area. Band-aid applied.Skin very dry and cracking. Remaining vaseline an q-tip sent home with wife.

## 2014-01-03 NOTE — Progress Notes (Signed)
Results for Nathaniel Steele, Nathaniel C (MRN [id number]) as of 01/03/2014 10:09  Ref. Range 01/03/2014 09:07  Hemoglobin Latest Range: 13.0-17.0 g/dL 8.4 (L)  HCT Latest Range: 39.0-52.0 % 27.0 (L)

## 2014-01-03 NOTE — Telephone Encounter (Signed)
Received records from [facility] (Dr Camille Balynthia Dunham) for appointment on 02/16/14 with Dr Allyson SabalBerry.  [facility] ([facility]) for Dr Hazle CocaBerry's schedule on 02/16/14.  lp

## 2014-01-03 NOTE — Discharge Instructions (Signed)
Darbepoetin Alfa injection What is this medicine? DARBEPOETIN ALFA (dar be POE e tin AL fa) helps your body make more red blood cells. It is used to treat anemia caused by chronic kidney failure and chemotherapy. This medicine may be used for other purposes; ask your health care provider or pharmacist if you have questions. COMMON BRAND NAME(S): Aranesp What should I tell my health care provider before I take this medicine? They need to know if you have any of these conditions: -blood clotting disorders or history of blood clots -cancer patient not on chemotherapy -cystic fibrosis -heart disease, such as angina, heart failure, or a history of a heart attack -hemoglobin level of 12 g/dL or greater -high blood pressure -low levels of folate, iron, or vitamin B12 -seizures -an unusual or allergic reaction to darbepoetin, erythropoietin, albumin, hamster proteins, latex, other medicines, foods, dyes, or preservatives -pregnant or trying to get pregnant -breast-feeding How should I use this medicine? This medicine is for injection into a vein or under the skin. It is usually given by a health care professional in a hospital or clinic setting. If you get this medicine at home, you will be taught how to prepare and give this medicine. Do not shake the solution before you withdraw a dose. Use exactly as directed. Take your medicine at regular intervals. Do not take your medicine more often than directed. It is important that you put your used needles and syringes in a special sharps container. Do not put them in a trash can. If you do not have a sharps container, call your pharmacist or healthcare provider to get one. Talk to your pediatrician regarding the use of this medicine in children. While this medicine may be used in children as young as 1 year for selected conditions, precautions do apply. Overdosage: If you think you have taken too much of this medicine contact a poison control center or  emergency room at once. NOTE: This medicine is only for you. Do not share this medicine with others. What if I miss a dose? If you miss a dose, take it as soon as you can. If it is almost time for your next dose, take only that dose. Do not take double or extra doses. What may interact with this medicine? Do not take this medicine with any of the following medications: -epoetin alfa This list may not describe all possible interactions. Give your health care provider a list of all the medicines, herbs, non-prescription drugs, or dietary supplements you use. Also tell them if you smoke, drink alcohol, or use illegal drugs. Some items may interact with your medicine. What should I watch for while using this medicine? Visit your prescriber or health care professional for regular checks on your progress and for the needed blood tests and blood pressure measurements. It is especially important for the doctor to make sure your hemoglobin level is in the desired range, to limit the risk of potential side effects and to give you the best benefit. Keep all appointments for any recommended tests. Check your blood pressure as directed. Ask your doctor what your blood pressure should be and when you should contact him or her. As your body makes more red blood cells, you may need to take iron, folic acid, or vitamin B supplements. Ask your doctor or health care provider which products are right for you. If you have kidney disease continue dietary restrictions, even though this medication can make you feel better. Talk with your doctor or health   care professional about the foods you eat and the vitamins that you take. What side effects may I notice from receiving this medicine? Side effects that you should report to your doctor or health care professional as soon as possible: -allergic reactions like skin rash, itching or hives, swelling of the face, lips, or tongue -breathing problems -changes in vision -chest  pain -confusion, trouble speaking or understanding -feeling faint or lightheaded, falls -high blood pressure -muscle aches or pains -pain, swelling, warmth in the leg -rapid weight gain -severe headaches -sudden numbness or weakness of the face, arm or leg -trouble walking, dizziness, loss of balance or coordination -seizures (convulsions) -swelling of the ankles, feet, hands -unusually weak or tired Side effects that usually do not require medical attention (report to your doctor or health care professional if they continue or are bothersome): -diarrhea -fever, chills (flu-like symptoms) -headaches -nausea, vomiting -redness, stinging, or swelling at site where injected This list may not describe all possible side effects. Call your doctor for medical advice about side effects. You may report side effects to FDA at 1-800-FDA-1088. Where should I keep my medicine? Keep out of the reach of children. Store in a refrigerator between 2 and 8 degrees C (36 and 46 degrees F). Do not freeze. Do not shake. Throw away any unused portion if using a single-dose vial. Throw away any unused medicine after the expiration date. NOTE: This sheet is a summary. It may not cover all possible information. If you have questions about this medicine, talk to your doctor, pharmacist, or health care provider.  2015, [employer]vier/[employer] Standard. (2007-12-21 10:23:57)  

## 2014-01-06 ENCOUNTER — Ambulatory Visit: Payer: Self-pay | Admitting: Pharmacist Clinician (PhC)/ Clinical Pharmacy Specialist

## 2014-01-06 DIAGNOSIS — Z7901 Long term (current) use of anticoagulants: Secondary | ICD-10-CM

## 2014-01-06 DIAGNOSIS — I480 Paroxysmal atrial fibrillation: Secondary | ICD-10-CM

## 2014-01-11 ENCOUNTER — Ambulatory Visit (INDEPENDENT_AMBULATORY_CARE_PROVIDER_SITE_OTHER): Payer: Medicare Other | Admitting: Pharmacist Clinician (PhC)/ Clinical Pharmacy Specialist

## 2014-01-11 DIAGNOSIS — Z7901 Long term (current) use of anticoagulants: Secondary | ICD-10-CM

## 2014-01-11 DIAGNOSIS — I4892 Unspecified atrial flutter: Secondary | ICD-10-CM

## 2014-01-11 DIAGNOSIS — I480 Paroxysmal atrial fibrillation: Secondary | ICD-10-CM

## 2014-01-11 LAB — POCT INR: INR: 4.1

## 2014-01-12 LAB — BASIC METABOLIC PANEL
BUN: 44 mg/dL — AB (ref 6–23)
CALCIUM: 9.5 mg/dL (ref 8.4–10.5)
CO2: 25 mEq/L (ref 19–32)
CREATININE: 2.62 mg/dL — AB (ref 0.50–1.35)
Chloride: 107 mEq/L (ref 96–112)
Glucose, Bld: 84 mg/dL (ref 70–99)
Potassium: 4.5 mEq/L (ref 3.5–5.3)
Sodium: 140 mEq/L (ref 135–145)

## 2014-01-27 ENCOUNTER — Ambulatory Visit (INDEPENDENT_AMBULATORY_CARE_PROVIDER_SITE_OTHER): Payer: Medicare Other | Admitting: Pharmacist Clinician (PhC)/ Clinical Pharmacy Specialist

## 2014-01-27 DIAGNOSIS — I480 Paroxysmal atrial fibrillation: Secondary | ICD-10-CM

## 2014-01-27 DIAGNOSIS — Z7901 Long term (current) use of anticoagulants: Secondary | ICD-10-CM

## 2014-01-27 DIAGNOSIS — I4892 Unspecified atrial flutter: Secondary | ICD-10-CM

## 2014-01-27 LAB — POCT INR: INR: 3.4

## 2014-01-31 ENCOUNTER — Emergency Department (HOSPITAL_COMMUNITY)
Admission: EM | Admit: 2014-01-31 | Discharge: 2014-01-31 | Disposition: A | Discharge status: Home / Self Care | Payer: Medicare Other | Attending: Emergency Medicine | Admitting: Emergency Medicine

## 2014-01-31 ENCOUNTER — Emergency Department (HOSPITAL_COMMUNITY): Payer: Medicare Other

## 2014-01-31 ENCOUNTER — Encounter (HOSPITAL_COMMUNITY): Payer: Self-pay

## 2014-01-31 ENCOUNTER — Encounter (HOSPITAL_COMMUNITY)
Admission: RE | Admit: 2014-01-31 | Discharge: 2014-01-31 | Disposition: A | Discharge status: Home / Self Care | Payer: Medicare Other | Source: Ambulatory Visit | Attending: Chiropractic Medicine | Admitting: Chiropractic Medicine

## 2014-01-31 DIAGNOSIS — Z880 Allergy status to penicillin: Secondary | ICD-10-CM | POA: Diagnosis not present

## 2014-01-31 DIAGNOSIS — I2510 Atherosclerotic heart disease of native coronary artery without angina pectoris: Secondary | ICD-10-CM | POA: Diagnosis not present

## 2014-01-31 DIAGNOSIS — Y998 Other external cause status: Secondary | ICD-10-CM | POA: Insufficient documentation

## 2014-01-31 DIAGNOSIS — Z79899 Other long term (current) drug therapy: Secondary | ICD-10-CM | POA: Diagnosis not present

## 2014-01-31 DIAGNOSIS — Z7982 Long term (current) use of aspirin: Secondary | ICD-10-CM | POA: Insufficient documentation

## 2014-01-31 DIAGNOSIS — D638 Anemia in other chronic diseases classified elsewhere: Secondary | ICD-10-CM | POA: Diagnosis not present

## 2014-01-31 DIAGNOSIS — N400 Enlarged prostate without lower urinary tract symptoms: Secondary | ICD-10-CM | POA: Insufficient documentation

## 2014-01-31 DIAGNOSIS — N184 Chronic kidney disease, stage 4 (severe): Secondary | ICD-10-CM | POA: Insufficient documentation

## 2014-01-31 DIAGNOSIS — Y9289 Other specified places as the place of occurrence of the external cause: Secondary | ICD-10-CM | POA: Insufficient documentation

## 2014-01-31 DIAGNOSIS — Z87891 Personal history of nicotine dependence: Secondary | ICD-10-CM | POA: Diagnosis not present

## 2014-01-31 DIAGNOSIS — I499 Cardiac arrhythmia, unspecified: Secondary | ICD-10-CM | POA: Diagnosis not present

## 2014-01-31 DIAGNOSIS — D649 Anemia, unspecified: Secondary | ICD-10-CM | POA: Diagnosis not present

## 2014-01-31 DIAGNOSIS — M1990 Unspecified osteoarthritis, unspecified site: Secondary | ICD-10-CM | POA: Diagnosis not present

## 2014-01-31 DIAGNOSIS — Y9389 Activity, other specified: Secondary | ICD-10-CM | POA: Insufficient documentation

## 2014-01-31 DIAGNOSIS — S51011A Laceration without foreign body of right elbow, initial encounter: Secondary | ICD-10-CM

## 2014-01-31 DIAGNOSIS — I5033 Acute on chronic diastolic (congestive) heart failure: Secondary | ICD-10-CM | POA: Diagnosis not present

## 2014-01-31 DIAGNOSIS — Z7901 Long term (current) use of anticoagulants: Secondary | ICD-10-CM | POA: Insufficient documentation

## 2014-01-31 DIAGNOSIS — N189 Chronic kidney disease, unspecified: Secondary | ICD-10-CM | POA: Insufficient documentation

## 2014-01-31 DIAGNOSIS — G2581 Restless legs syndrome: Secondary | ICD-10-CM | POA: Diagnosis not present

## 2014-01-31 DIAGNOSIS — Z950 Presence of cardiac pacemaker: Secondary | ICD-10-CM | POA: Diagnosis not present

## 2014-01-31 DIAGNOSIS — I129 Hypertensive chronic kidney disease with stage 1 through stage 4 chronic kidney disease, or unspecified chronic kidney disease: Secondary | ICD-10-CM | POA: Diagnosis not present

## 2014-01-31 DIAGNOSIS — W2201XA Walked into wall, initial encounter: Secondary | ICD-10-CM | POA: Diagnosis not present

## 2014-01-31 DIAGNOSIS — R609 Edema, unspecified: Secondary | ICD-10-CM

## 2014-01-31 LAB — FERRITIN: Ferritin: 334 ng/mL — ABNORMAL HIGH (ref 22–322)

## 2014-01-31 LAB — IRON AND TIBC
IRON: 45 ug/dL (ref 42–165)
SATURATION RATIOS: 18 % — AB (ref 20–55)
TIBC: 252 ug/dL (ref 215–435)
UIBC: 207 ug/dL (ref 125–400)

## 2014-01-31 LAB — PROTIME-INR
INR: 2.54 — AB (ref 0.00–1.49)
Prothrombin Time: 27.5 seconds — ABNORMAL HIGH (ref 11.6–15.2)

## 2014-01-31 LAB — HEMOGLOBIN AND HEMATOCRIT, BLOOD
HCT: 29.2 % — ABNORMAL LOW (ref 39.0–52.0)
HEMOGLOBIN: 9.3 g/dL — AB (ref 13.0–17.0)

## 2014-01-31 MED ORDER — DARBEPOETIN ALFA 100 MCG/0.5ML IJ SOSY
200.0000 ug | PREFILLED_SYRINGE | Freq: Once | INTRAMUSCULAR | Status: AC
  Administered 2014-01-31: 200 ug via SUBCUTANEOUS
  Filled 2014-01-31: qty 1

## 2014-01-31 NOTE — ED Provider Notes (Signed)
CSN: [id number]     Arrival date & time 01/31/14  0946 History   First MD Initiated Contact with Patient 01/31/14 1011     Chief Complaint  Patient presents with  . skin tear      (Consider location/radiation/quality/duration/timing/severity/associated sxs/prior Treatment) HPI Comments: Pt comes in with c/o skin tear to the right elbow yesterday. Pt states that he fell into the wall. No loc or dizziness associated with fall. He states that the area hasn't stopped bleeding. Pt is on coumadin.  The history is provided by the patient. No language interpreter was used.    Past Medical History  Diagnosis Date  . Shortness of breath with exertion  . Chronic kidney disease     not on dialysis yet  . Coronary artery disease 05/14/2010    stress test - no scintigraphic evidence of inducible myocardial ischemia,; normal study  . Hypertension   . Arthritis   . Blood dyscrasia     one time had low platlet count  . Dysrhythmia     atrial fib  . SSS (sick sinus syndrome) 08/27/2011    echo EF >55%, severe LAE, moderate RAE, tissue AVR w/ gradients 35 and  . Pericardial effusion 03/12/2011    echo EF 55-60%  . Claudication 02/19/2012    LE doppler no evidence of arterial insufficiency, lower extremities demonstrate normal values  w/ no evidence of insufficiency  . Lupus nephritis   . Pacemaker 09/19/2004    implanted  . Restless leg syndrome   . Cellulitis   . Anemia   . Benign prostatic hypertrophy   . Constipation   . Shoulder joint pain   . Acute on chronic diastolic CHF (congestive heart failure), NYHA class 3 12/07/2013   Past Surgical History  Procedure Laterality Date  . Back surgery    . Appendectomy    . Hernia repair    . Pacemaker insertion  2006    [employer] EnRhythm  . Av fistula placement  02/24/2011  . Aortic valve replacement  02/24/2011    pericardial tissue valve [facility] Ease  . Coronary artery bypass graft  02/24/2011    LIMA to LAD, SVG to distal RCA   . Cardiac catheterization  02/13/2011    mod/severe aortic valve stenosis w peak to peak gradient 25-32 mmHg,  70% stenosis LAD, 30-40%proximal followed by 90% sstenosis in RCA prior to anterior RV margin branch  . Lumbar laminectomy/decompression microdiscectomy Right 04/16/2012    Procedure: DECOMPRESSIVE L4 - L5/ MICRODISCECTOMY ON THE RIGHT 1 LEVEL;  Surgeon: Jacki Cones, MD;  Location: [facility] ORS;  Service: Orthopedics;  Laterality: Right;  . Esophagogastroduodenoscopy N/A 06/21/2013    Procedure: ESOPHAGOGASTRODUODENOSCOPY (EGD);  Surgeon: Theda Belfast, MD;  Location[facility] ENDOSCOPY;  Service: Endoscopy;  Laterality: N/A;  . Givens capsule study N/A 06/21/2013    Procedure: GIVENS CAPSULE STUDY;  Surgeon: Theda Belfast, MD;  Location: [facility] ENDOSCOPY;  Service: Endoscopy;  Laterality: N/A;  . Cardioversion N/A 12/09/2013    Procedure: CARDIOVERSION;  Surgeon: Thurmon Fair, MD;  Location: [facility] ENDOSCOPY;  Service: Cardiovascular;  Laterality: N/A;  . Left and right heart catheterization with coronary angiogram N/A 02/13/2011    Procedure: LEFT AND RIGHT HEART CATHETERIZATION WITH CORONARY ANGIOGRAM;  Surgeon: Lennette Bihari, MD;  Location: [facility] CATH LAB;  Service: Cardiovascular;  Laterality: N/A;  . Permanent pacemaker generator change N/A 05/10/2013    Procedure: PERMANENT PACEMAKER GENERATOR CHANGE;  Surgeon: Thurmon Fair, MD;  Location: [facility] CATH  LAB;  Service: Cardiovascular;  Laterality: N/A;   Family History  Problem Relation Age of Onset  . Hypertension Mother   . Kidney disease Brother    History  Substance Use Topics  . Smoking status: Former Smoker    Types: Cigarettes    Quit date: 02/11/1957  . Smokeless tobacco: Never Used  . Alcohol Use: No    Review of Systems  All other systems reviewed and are negative.     Allergies  Statins; Aspirin; Feraheme; and Penicillins  Home Medications   Prior to Admission medications   Medication Sig Start Date End Date Taking?  Authorizing Provider  amiodarone (PACERONE) 200 MG tablet Take 2 tablets (400 mg total) by mouth daily. 12/09/13  Yes Mihai Croitoru, MD  aspirin EC 81 MG tablet Take 81 mg by mouth daily.   Yes Historical Provider, MD  calcitRIOL (ROCALTROL) 0.25 MCG capsule Take 0.25 mcg by mouth daily before breakfast.    Yes Historical Provider, MD  calcium acetate (PHOSLO) 667 MG capsule Take 667 mg by mouth 3 (three) times daily with meals.   Yes Historical Provider, MD  Cyanocobalamin (VITAMIN B 12 PO) Take 1 tablet by mouth daily.   Yes Historical Provider, MD  cycloSPORINE (RESTASIS) 0.05 % ophthalmic emulsion Place 1 drop into both eyes 2 (two) times daily.    Yes Historical Provider, MD  finasteride (PROSCAR) 5 MG tablet Take 5 mg by mouth daily.   Yes Historical Provider, MD  fish oil-omega-3 fatty acids 1000 MG capsule Take 1 g by mouth daily.    Yes Historical Provider, MD  furosemide (LASIX) 40 MG tablet Take 2 tablets (80 mg total) by mouth 2 (two) times daily. Takes 4 tablets Patient taking differently: Take 160 mg by mouth 2 (two) times daily.  12/25/13  Yes Leroy Sea, MD  metoprolol (LOPRESSOR) 50 MG tablet Take 50 mg by mouth daily before breakfast.    Yes Historical Provider, MD  Multiple Vitamins-Minerals (OCUVITE PRESERVISION PO) Take 1 tablet by mouth daily.   Yes Historical Provider, MD  pantoprazole (PROTONIX) 40 MG tablet Take 1 tablet (40 mg total) by mouth 2 (two) times daily. 06/23/13  Yes Joseph Art, DO  rOPINIRole (REQUIP) 2 MG tablet Take 1 tablet (2 mg total) by mouth 2 (two) times daily. 12/13/13  Yes Mihai Croitoru, MD  warfarin (COUMADIN) 5 MG tablet Take 1 tablet (5 mg total) by mouth daily. Patient taking differently: Take 2.5-5 mg by mouth daily at 6 PM. Patient takes 1 tablet daily except 1/2 tablet(2.5mg ) on Monday and Friday 11/18/13  Yes Kristin Alvstad, RPH-CPP   BP 115/68 mmHg  Pulse 71  Temp(Src) 97.8 F (36.6 C) (Oral)  Resp 20  Ht  (1.702 m)  Wt  163 lb (73.936 kg)  BMI 25.52 kg/m2  SpO2 96% Physical Exam  Constitutional: He is oriented to person, place, and time. He appears well-developed and well-nourished.  Cardiovascular: Normal rate and regular rhythm.   Pulmonary/Chest: Effort normal and breath sounds normal.  Musculoskeletal: Normal range of motion.  Neurological: He is alert and oriented to person, place, and time. He exhibits normal muscle tone. Coordination normal.  Skin:  Bruising and large skin tear to the right elbow  Nursing note and vitals reviewed.   ED Course  LACERATION REPAIR Date/Time: 01/31/2014 11:39 AM Performed by: Teressa Lower Authorized by: Teressa Lower Consent: Verbal consent obtained. Risks and benefits: risks, benefits and alternatives were discussed Consent given by: patient Body area:  upper extremity Location details: right elbow Laceration length: 8 cm Foreign bodies: no foreign bodies Anesthesia: local infiltration Irrigation solution: saline Amount of cleaning: standard Skin closure: glue Approximation: close Approximation difficulty: simple Patient tolerance: Patient tolerated the procedure well with no immediate complications   (including critical care time) Labs Review Labs Reviewed  PROTIME-INR - Abnormal; Notable for the following:    Prothrombin Time 27.5 (*)    INR 2.54 (*)    All other components within normal limits    Imaging Review Dg Elbow Complete Right  01/31/2014   CLINICAL DATA:  Swelling, status post fall  EXAM: RIGHT ELBOW - COMPLETE 3+ VIEW  COMPARISON:  None.  FINDINGS: The right elbow demonstrates no fracture or dislocation. There is no significant joint effusion. The soft tissues are normal.  IMPRESSION: No acute osseous injury of the right elbow.   Electronically Signed   By: Elige KoHetal  Patel   On: 01/31/2014 11:14     EKG Interpretation None      MDM   Final diagnoses:  Swelling  Skin tear of elbow without complication, right, initial  encounter    Wound closed without any problem. Tetanus is utd. Coumadin is therapeutic. Discussed return precautions    Teressa LowerVrinda Almadelia Looman, NP 01/31/14 1141  Samuel JesterKathleen McManus, DO 02/02/14 1350

## 2014-01-31 NOTE — Progress Notes (Signed)
Results for Nathaniel Steele, Nathaniel Steele (MRN [id number]) as of 01/31/2014 11:14  Ref. Range 01/31/2014 09:20  Hemoglobin Latest Range: 13.0-17.0 g/dL 9.3 (L)  HCT Latest Range: 39.0-52.0 % 29.2 (L)

## 2014-01-31 NOTE — Discharge Instructions (Signed)
Follow up with your doctor or return here for any sign of infection Skin Tear Care A skin tear is when the top layer of skin peels off. To repair the skin, your doctor may use:   Tape.  Skin adhesive strips. HOME CARE  Change bandages (dressings) once a day or as told by your doctor.  Gently clean the area with salt (saline) solution or with a mild soap and water.  Do not rub the injured skin dry. Let the area air dry.  Put petroleum jelly or antibiotic cream on the tear. Do not allow a scab to form.  If the bandage sticks, moisten it with warm soapy water and remove it.  Protect the injured skin until it has healed.  Only take medicine as told by your doctor.  Take showers or baths using warm soapy water. Apply a new bandage after the shower or bath.  Keep all doctor visits as told. GET HELP RIGHT AWAY IF:   You have redness, puffiness (swelling), or more pain in the tear.  You have ayellowish-white fluid (pus) coming from the tear.  You have chills.  You have a red streak that goes away from the tear.  You have a bad smell coming from the tear or bandage.  You have a fever or lasting symptoms for more than 2-3 days.  You have a fever and your symptoms suddenly get worse. MAKE SURE YOU:   Understand these instructions.  Will watch this condition.  Will get help right away if you are not doing well or get worse. Document Released: 10/16/2007 Document Revised: 10/01/2011 Document Reviewed: 07/21/2011 [facility] Patient Information 2015 [employer] [place]. This information is not intended to replace advice given to you by your health care provider. Make sure you discuss any questions you have with your health care provider.

## 2014-01-31 NOTE — ED Notes (Signed)
Dermabond at bedside.  

## 2014-01-31 NOTE — ED Notes (Signed)
PT reports fell yesterday and has a skin tear above r elbow.  Pt was in the specialty clinic getting his "shot for low hemoglobin" and lab work.   RN noticed blood coming through pt's shirt.  Clydie BraunKaren RN dressed wound and brought pt to ER for eval.

## 2014-02-13 ENCOUNTER — Emergency Department (HOSPITAL_COMMUNITY)
Admission: EM | Admit: 2014-02-13 | Discharge: 2014-02-13 | Disposition: A | Discharge status: Home / Self Care | Payer: Medicare Other | Attending: Emergency Medicine | Admitting: Emergency Medicine

## 2014-02-13 ENCOUNTER — Encounter (HOSPITAL_COMMUNITY): Payer: Self-pay | Admitting: Emergency Medicine

## 2014-02-13 DIAGNOSIS — Z7901 Long term (current) use of anticoagulants: Secondary | ICD-10-CM | POA: Insufficient documentation

## 2014-02-13 DIAGNOSIS — N189 Chronic kidney disease, unspecified: Secondary | ICD-10-CM | POA: Diagnosis not present

## 2014-02-13 DIAGNOSIS — Z862 Personal history of diseases of the blood and blood-forming organs and certain disorders involving the immune mechanism: Secondary | ICD-10-CM | POA: Diagnosis not present

## 2014-02-13 DIAGNOSIS — G2581 Restless legs syndrome: Secondary | ICD-10-CM | POA: Insufficient documentation

## 2014-02-13 DIAGNOSIS — Z950 Presence of cardiac pacemaker: Secondary | ICD-10-CM | POA: Diagnosis not present

## 2014-02-13 DIAGNOSIS — I5033 Acute on chronic diastolic (congestive) heart failure: Secondary | ICD-10-CM | POA: Diagnosis not present

## 2014-02-13 DIAGNOSIS — N400 Enlarged prostate without lower urinary tract symptoms: Secondary | ICD-10-CM | POA: Insufficient documentation

## 2014-02-13 DIAGNOSIS — I129 Hypertensive chronic kidney disease with stage 1 through stage 4 chronic kidney disease, or unspecified chronic kidney disease: Secondary | ICD-10-CM | POA: Insufficient documentation

## 2014-02-13 DIAGNOSIS — S51001D Unspecified open wound of right elbow, subsequent encounter: Secondary | ICD-10-CM | POA: Insufficient documentation

## 2014-02-13 DIAGNOSIS — I4891 Unspecified atrial fibrillation: Secondary | ICD-10-CM | POA: Insufficient documentation

## 2014-02-13 DIAGNOSIS — I2510 Atherosclerotic heart disease of native coronary artery without angina pectoris: Secondary | ICD-10-CM | POA: Insufficient documentation

## 2014-02-13 DIAGNOSIS — Z880 Allergy status to penicillin: Secondary | ICD-10-CM | POA: Diagnosis not present

## 2014-02-13 DIAGNOSIS — Z872 Personal history of diseases of the skin and subcutaneous tissue: Secondary | ICD-10-CM | POA: Insufficient documentation

## 2014-02-13 DIAGNOSIS — Z87891 Personal history of nicotine dependence: Secondary | ICD-10-CM | POA: Diagnosis not present

## 2014-02-13 DIAGNOSIS — M1990 Unspecified osteoarthritis, unspecified site: Secondary | ICD-10-CM | POA: Insufficient documentation

## 2014-02-13 DIAGNOSIS — W01198D Fall on same level from slipping, tripping and stumbling with subsequent striking against other object, subsequent encounter: Secondary | ICD-10-CM | POA: Diagnosis not present

## 2014-02-13 DIAGNOSIS — Z7982 Long term (current) use of aspirin: Secondary | ICD-10-CM | POA: Diagnosis not present

## 2014-02-13 DIAGNOSIS — Z8719 Personal history of other diseases of the digestive system: Secondary | ICD-10-CM | POA: Insufficient documentation

## 2014-02-13 DIAGNOSIS — Z951 Presence of aortocoronary bypass graft: Secondary | ICD-10-CM | POA: Insufficient documentation

## 2014-02-13 DIAGNOSIS — Z4801 Encounter for change or removal of surgical wound dressing: Secondary | ICD-10-CM | POA: Diagnosis present

## 2014-02-13 DIAGNOSIS — Z79899 Other long term (current) drug therapy: Secondary | ICD-10-CM | POA: Insufficient documentation

## 2014-02-13 NOTE — ED Provider Notes (Signed)
CSN: [id number]     Arrival date & time 02/13/14  0940 History  This chart was scribed for Flint Melter, MD by Tonye Royalty, ED Scribe. This patient was seen in room APA01/APA01 and the patient's care was started at 10:00 AM.    Chief Complaint  Patient presents with  . Wound Check   The history is provided by the patient and the spouse. No language interpreter was used.    HPI Comments: Nathaniel Steele is a 81 y.o. male who uses Coumadin who presents to the Emergency Department complaining of skin tear to his right elbow after falling against a wall 2 weeks ago. He was evaluated here and the wound was closed with Dermabond. He states the wound has continued bleeding. Wife states she has been cleaning it with soapy water. He states he feels somewhat dizzy. He states he last had a shot for his iron 2 weeks ago. He denies weakness.  PCP: Delorse Lek, MD   Past Medical History  Diagnosis Date  . Shortness of breath with exertion  . Chronic kidney disease     not on dialysis yet  . Coronary artery disease 05/14/2010    stress test - no scintigraphic evidence of inducible myocardial ischemia,; normal study  . Hypertension   . Arthritis   . Blood dyscrasia     one time had low platlet count  . Dysrhythmia     atrial fib  . SSS (sick sinus syndrome) 08/27/2011    echo EF >55%, severe LAE, moderate RAE, tissue AVR w/ gradients 35 and  . Pericardial effusion 03/12/2011    echo EF 55-60%  . Claudication 02/19/2012    LE doppler no evidence of arterial insufficiency, lower extremities demonstrate normal values  w/ no evidence of insufficiency  . Lupus nephritis   . Pacemaker 09/19/2004    implanted  . Restless leg syndrome   . Cellulitis   . Anemia   . Benign prostatic hypertrophy   . Constipation   . Shoulder joint pain   . Acute on chronic diastolic CHF (congestive heart failure), NYHA class 3 12/07/2013   Past Surgical History  Procedure Laterality Date  . Back surgery     . Appendectomy    . Hernia repair    . Pacemaker insertion  2006    [employer] EnRhythm  . Av fistula placement  02/24/2011  . Aortic valve replacement  02/24/2011    pericardial tissue valve [facility] Ease  . Coronary artery bypass graft  02/24/2011    LIMA to LAD, SVG to distal RCA  . Cardiac catheterization  02/13/2011    mod/severe aortic valve stenosis w peak to peak gradient 25-32 mmHg,  70% stenosis LAD, 30-40%proximal followed by 90% sstenosis in RCA prior to anterior RV margin branch  . Lumbar laminectomy/decompression microdiscectomy Right 04/16/2012    Procedure: DECOMPRESSIVE L4 - L5/ MICRODISCECTOMY ON THE RIGHT 1 LEVEL;  Surgeon: Jacki Cones, MD;  Location: [facility] ORS;  Service: Orthopedics;  Laterality: Right;  . Esophagogastroduodenoscopy N/A 06/21/2013    Procedure: ESOPHAGOGASTRODUODENOSCOPY (EGD);  Surgeon: Theda Belfast, MD;  Location: [facility] ENDOSCOPY;  Service: Endoscopy;  Laterality: N/A;  . Givens capsule study N/A 06/21/2013    Procedure: GIVENS CAPSULE STUDY;  Surgeon: Theda Belfast, MD;  Location: [facility] ENDOSCOPY;  Service: Endoscopy;  Laterality: N/A;  . Cardioversion N/A 12/09/2013    Procedure: CARDIOVERSION;  Surgeon: Thurmon Fair, MD;  Location: [facility] ENDOSCOPY;  Service: Cardiovascular;  Laterality: N/A;  .  Left and right heart catheterization with coronary angiogram N/A 02/13/2011    Procedure: LEFT AND RIGHT HEART CATHETERIZATION WITH CORONARY ANGIOGRAM;  Surgeon: Lennette Biharihomas A Kelly, MD;  [facility];  Service: Cardiovascular;  Laterality: N/A;  . Permanent pacemaker generator change N/A 05/10/2013    Procedure: PERMANENT PACEMAKER GENERATOR CHANGE;  Surgeon: Thurmon FairMihai Croitoru, MD;  Location: [facility] CATH LAB;  Service: Cardiovascular;  Laterality: N/A;   Family History  Problem Relation Age of Onset  . Hypertension Mother   . Kidney disease Brother    History  Substance Use Topics  . Smoking status: Former Smoker    Types: Cigarettes    Quit date: 02/11/1957  .  Smokeless tobacco: Never Used  . Alcohol Use: No    Review of Systems  Skin: Positive for wound.  Neurological: Positive for dizziness. Negative for weakness.  Hematological: Bruises/bleeds easily.  All other systems reviewed and are negative.     Allergies  Statins; Aspirin; Feraheme; and Penicillins  Home Medications   Prior to Admission medications   Medication Sig Start Date End Date Taking? Authorizing Provider  amiodarone (PACERONE) 200 MG tablet Take 2 tablets (400 mg total) by mouth daily. 12/09/13  Yes Mihai Croitoru, MD  aspirin EC 81 MG tablet Take 81 mg by mouth daily.   Yes Historical Provider, MD  calcitRIOL (ROCALTROL) 0.25 MCG capsule Take 0.25 mcg by mouth daily before breakfast.    Yes Historical Provider, MD  calcium acetate (PHOSLO) 667 MG capsule Take 667 mg by mouth 3 (three) times daily with meals.   Yes Historical Provider, MD  Cyanocobalamin (VITAMIN B 12 PO) Take 1 tablet by mouth daily.   Yes Historical Provider, MD  cycloSPORINE (RESTASIS) 0.05 % ophthalmic emulsion Place 1 drop into both eyes 2 (two) times daily.    Yes Historical Provider, MD  finasteride (PROSCAR) 5 MG tablet Take 5 mg by mouth daily.   Yes Historical Provider, MD  fish oil-omega-3 fatty acids 1000 MG capsule Take 1 g by mouth daily.    Yes Historical Provider, MD  furosemide (LASIX) 40 MG tablet Take 2 tablets (80 mg total) by mouth 2 (two) times daily. Takes 4 tablets Patient taking differently: Take 160 mg by mouth 2 (two) times daily.  12/25/13  Yes Leroy SeaPrashant K Singh, MD  metoprolol (LOPRESSOR) 50 MG tablet Take 50 mg by mouth daily before breakfast.    Yes Historical Provider, MD  Multiple Vitamins-Minerals (OCUVITE PRESERVISION PO) Take 1 tablet by mouth daily.   Yes Historical Provider, MD  pantoprazole (PROTONIX) 40 MG tablet Take 1 tablet (40 mg total) by mouth 2 (two) times daily. 06/23/13  Yes Joseph ArtJessica U Vann, DO  rOPINIRole (REQUIP) 2 MG tablet Take 1 tablet (2 mg total) by mouth  2 (two) times daily. 12/13/13  Yes Mihai Croitoru, MD  warfarin (COUMADIN) 5 MG tablet Take 1 tablet (5 mg total) by mouth daily. Patient taking differently: Take 2.5-5 mg by mouth daily at 6 PM. Patient takes 1 tablet daily except 1/2 tablet(2.5mg ) on Monday and Friday 11/18/13  Yes Kristin Alvstad, RPH-CPP   BP 130/73 mmHg  Pulse 78  Temp(Src) 98.4 F (36.9 C) (Oral)  Resp 18  Ht 5\' 7"  (1.702 m)  Wt 166 lb (75.297 kg)  BMI 25.99 kg/m2  SpO2 94% Physical Exam  Constitutional: He is oriented to person, place, and time. He appears well-developed and well-nourished.  HENT:  Head: Normocephalic and atraumatic.  Right Ear: External ear normal.  Left Ear:  External ear normal.  Eyes: Conjunctivae and EOM are normal. Pupils are equal, round, and reactive to light.  Neck: Normal range of motion and phonation normal. Neck supple.  Cardiovascular: Normal rate, regular rhythm and normal heart sounds.   Pulmonary/Chest: Effort normal and breath sounds normal. He exhibits no bony tenderness.  Abdominal: Soft. There is no tenderness.  Musculoskeletal: Normal range of motion.  Normal range of motion right arm  Neurological: He is alert and oriented to person, place, and time. No cranial nerve deficit or sensory deficit. He exhibits normal muscle tone. Coordination normal.  Skin: Skin is warm, dry and intact.  Wound on right elbow, normal ROM of right arm, no deformity   Psychiatric: He has a normal mood and affect. His behavior is normal. Judgment and thought content normal.  Nursing note and vitals reviewed.   ED Course  Procedures (including critical care time)  DIAGNOSTIC STUDIES: Oxygen Saturation is 95% on room air, adequate by my interpretation.    COORDINATION OF CARE: 10:03 AM Discussed treatment plan with patient at beside, the patient agrees with the plan and has no further questions at this time.  Wound care by nursing- warm soaks and gentle scrub.  Wounds assessed after  cleansing.  There is an approximately 4 x 4 centimeter, slightly raised, irregular wound with several areas of open drainage and bleeding.  The blood that extrudes from the wound appears old.  There is ecchymosis above and below the open wounds, consistent with a large contusion associated with the injury.  There is no discharge or proximal streaking from the wounds.  There is granulation tissue indicating partial wound healing.   Labs Review Labs Reviewed - No data to display  Imaging Review No results found.   EKG Interpretation None      MDM   Final diagnoses:  Elbow wound, right, subsequent encounter    Healing wound, right elbow without signs of infection.  He likely has a subcutaneous hematoma which is slowly resolving and leaking blood through the open areas.  There is no indication for elbow joint involvement.  Nursing Notes Reviewed/ Care Coordinated Applicable Imaging Reviewed Interpretation of Laboratory Data incorporated into ED treatment  The patient appears reasonably screened and/or stabilized for discharge and I doubt any other medical condition or other [facility] requiring further screening, evaluation, or treatment in the ED at this time prior to discharge.  Plan: Home Medications- usual; Home Treatments- rest; return here if the recommended treatment, does not improve the symptoms; Recommended follow up- PCP prn  I personally performed the services described in this documentation, which was scribed in my presence. The recorded information has been reviewed and is accurate.      Flint Melter, MD 02/13/14 [phone]

## 2014-02-13 NOTE — ED Notes (Signed)
PT obtained a large skin tear on 01/31/14 and is in for a wound recheck. PT right elow bruised through the upper right arm and blood clotted noted to right elbow.

## 2014-02-13 NOTE — Discharge Instructions (Signed)
The wound on your right elbow, is healing slowly, without signs of infection. The wound is bleeding because of a collection of blood underneath the skin. The treatment for this type of problem is a warm compress 3 or 4 times a day for 30-40 minutes. After using the warm compress, clean the wound with soap and water. Keep a light gauze bandage on the wound, until it heals.    Wound Check Your wound appears healthy today. Your wound will heal gradually over time. Eventually a scar will form that will fade with time. FACTORS THAT AFFECT SCAR FORMATION:  People differ in the severity in which they scar.  Scar severity varies according to location, size, and the traits you inherited from your parents (genetic predisposition).  Irritation to the wound from infection, rubbing, or chemical exposure will increase the amount of scar formation. HOME CARE INSTRUCTIONS   If you were given a dressing, you should change it at least once a day or as instructed by your caregiver. If the bandage sticks, soak it off with a solution of hydrogen peroxide.  If the bandage becomes wet, dirty, or develops a bad smell, change it as soon as possible.  Look for signs of infection.  Only take over-the-counter or prescription medicines for pain, discomfort, or fever as directed by your caregiver. SEEK IMMEDIATE MEDICAL CARE IF:   You have redness, swelling, or increasing pain in the wound.  You notice pus coming from the wound.  You have a fever.  You notice a bad smell coming from the wound or dressing. Document Released: 10/13/2003 Document Revised: 03/31/2011 Document Reviewed: 01/06/2005 [facility] Patient Information 2015 [employer] [place]. This information is not intended to replace advice given to you by your health care provider. Make sure you discuss any questions you have with your health care provider.

## 2014-02-13 NOTE — ED Notes (Signed)
Wound cleaned, wound oozing blood; warm, moist compress applied to remove any remaining clots.

## 2014-02-13 NOTE — ED Notes (Signed)
Patient ambulatory to restroom  ?

## 2014-02-16 ENCOUNTER — Ambulatory Visit (INDEPENDENT_AMBULATORY_CARE_PROVIDER_SITE_OTHER): Payer: Medicare Other | Admitting: *Deleted

## 2014-02-16 ENCOUNTER — Ambulatory Visit (INDEPENDENT_AMBULATORY_CARE_PROVIDER_SITE_OTHER): Payer: Medicare Other | Admitting: Cardiovascular Disease

## 2014-02-16 ENCOUNTER — Encounter: Payer: Self-pay | Admitting: Cardiovascular Disease

## 2014-02-16 VITALS — BP 130/86 | HR 89 | Resp 16 | Ht 67.0 in | Wt 173.2 lb

## 2014-02-16 DIAGNOSIS — Z952 Presence of prosthetic heart valve: Secondary | ICD-10-CM

## 2014-02-16 DIAGNOSIS — Z950 Presence of cardiac pacemaker: Secondary | ICD-10-CM

## 2014-02-16 DIAGNOSIS — I495 Sick sinus syndrome: Secondary | ICD-10-CM

## 2014-02-16 DIAGNOSIS — I480 Paroxysmal atrial fibrillation: Secondary | ICD-10-CM

## 2014-02-16 DIAGNOSIS — R001 Bradycardia, unspecified: Secondary | ICD-10-CM

## 2014-02-16 DIAGNOSIS — Z951 Presence of aortocoronary bypass graft: Secondary | ICD-10-CM

## 2014-02-16 DIAGNOSIS — I4892 Unspecified atrial flutter: Secondary | ICD-10-CM

## 2014-02-16 DIAGNOSIS — N185 Chronic kidney disease, stage 5: Secondary | ICD-10-CM

## 2014-02-16 DIAGNOSIS — Z7901 Long term (current) use of anticoagulants: Secondary | ICD-10-CM

## 2014-02-16 DIAGNOSIS — Z954 Presence of other heart-valve replacement: Secondary | ICD-10-CM

## 2014-02-16 DIAGNOSIS — I5033 Acute on chronic diastolic (congestive) heart failure: Secondary | ICD-10-CM

## 2014-02-16 LAB — MDC_IDC_ENUM_SESS_TYPE_INCLINIC
Battery Impedance: 100 Ohm
Battery Voltage: 2.78 V
Brady Statistic AP VS Percent: 0.2 %
Brady Statistic AS VS Percent: 0.1 % — CL
Lead Channel Impedance Value: 529 Ohm
Lead Channel Pacing Threshold Amplitude: 1.75 V
Lead Channel Pacing Threshold Pulse Width: 0.4 ms
Lead Channel Pacing Threshold Pulse Width: 0.4 ms
Lead Channel Setting Pacing Amplitude: 2 V
Lead Channel Setting Pacing Amplitude: 3.75 V
Lead Channel Setting Sensing Sensitivity: 2.8 mV
MDC IDC MSMT LEADCHNL RA IMPEDANCE VALUE: 378 Ohm
MDC IDC MSMT LEADCHNL RA PACING THRESHOLD AMPLITUDE: 1 V
MDC IDC SET LEADCHNL RV PACING PULSEWIDTH: 0.46 ms
MDC IDC STAT BRADY AP VP PERCENT: 99.4 %
MDC IDC STAT BRADY AS VP PERCENT: 0.3 %

## 2014-02-16 MED ORDER — AMIODARONE HCL 200 MG PO TABS: 200.0000 mg | ORAL_TABLET | Freq: Every day | ORAL | Status: DC

## 2014-02-16 NOTE — Patient Instructions (Signed)
DECREASE Amiodarone to 200mg  daily.  Remote monitoring is used to monitor your Pacemaker or ICD from home. This monitoring reduces the number of office visits required to check your device to one time per year. It allows us to monitor the functioning of your device to ensure it is working properly. You are scheduled for a device check from home on May 19, 2014. You may send your transmission at any time that day. If you have a wireless device, the transmission will be sent automatically. After your physician reviews your transmission, you will receive a postcard with your next transmission date.  Dr. Royann Shiversroitoru recommends that you schedule a follow-up appointment in: 6 months with pacemaker check - [employer].

## 2014-02-16 NOTE — Progress Notes (Signed)
Patient ID: Nathaniel Steele, male   DOB: 23-May-1926, 81 y.o.   MRN: [id number]     Reason for office visit Persistent atrial flutter status post cardioversion, sinus node dysfunction status post pacemaker, chronic diastolic heart failure, CAD status post CABG  Nathaniel Steele returns in follow-up after 3 months since elective cardioversion for a lengthy episode of persistent atrial flutter. He has no cardiac complaints. These make her interrogation shows that he has been almost 100% arrhythmia free since the cardioversion. He had several hours of atrial flutter about a week after the procedure, but no arrhythmia since. He is virtually 100% atrial paced and ventricular paced without any detectable P waves or R waves today. He is on amiodarone 400 mg daily since the cardioversion  A few weeks ago he was briefly hospitalized for dehydration with acute on chronic renal failure. 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  His pacemaker was initially implanted in 2006 for sinus node dysfunction with symptomatic bradycardia and he underwent a generator change out in April of 2015. Although not technically pacemaker dependent his underlying rhythm is extremely slow sinus bradycardia at about 30 beats per minute. He also has a very long AV conduction time at almost 700 ms so that he paces the ventricle 100% of the time. We attempted to discontinue amiodarone to see if this would reduce the frequency of ventricular pacing, unsuccessfully. Amiodarone was restarted secondary to increased burden of atrial fibrillation.  He has a past history of coronary disease and previous two-vessel bypass surgery, aortic valve replacement with a biological prosthesis in 2013, recurrent paroxysmal atrial tachycardia and fibrillation on chronic warfarin anticoagulation. He has hyperlipidemia and hypertension. His last echo in August 2014 showed LVEF  50-55% and normal bioprosthetic valve function (mean gradient 15 mm Hg), PA pressure 39 mm Hg.  Nathaniel Steele was hospitalized in April 2015 with acute anemia related to spontaneous bleeding in his right lower extremity and again hospitalized in June 2015 with anemia, presumably related to gastrointestinal bleeding. Stools were Hemoccult positive but upper endoscopy, capsule enteroscopy were not revealing. He had a remote normal colonoscopy except for some diverticulosis. He required a total of 2 units of PRBC transfusion in April and 6 units of transfusion in June. On both occasions his warfarin anticoagulation was only slightly above target range. As far as I know he has never had a stroke/TIA or other embolic events.  He gets many of his medications from the [facility] hospital and prefers to have the labs drawn there.  He has stage IV chronic kidney disease and already has a well-developed, mature a fistula in his left forearm. He has not yet started dialysis. His kidney disease is felt to be secondary to lupus nephritis, with a collagen vascular disease in remission for several years. He has a history of neuropathy and restless leg syndrome and had frostbite while in [place] during the war. He has had surgery twice for lumbar spine disease.   Allergies  Allergen Reactions  . Statins Other (See Comments)    myalgias  . Aspirin Hives    Patient states that he can take the enteric coated but not the uncoated  . Feraheme [Ferumoxytol] Rash and Other (See Comments)    Abdominal pain  . Penicillins Hives    Current Outpatient Prescriptions  Medication Sig Dispense Refill  . amiodarone (PACERONE) 200 MG tablet Take 1 tablet (200 mg total) by  mouth daily. 60 tablet 11  . aspirin EC 81 MG tablet Take 81 mg by mouth daily.    . calcitRIOL (ROCALTROL) 0.25 MCG capsule Take 0.25 mcg by mouth daily before breakfast.     . calcium acetate (PHOSLO) 667 MG capsule Take 667 mg by mouth 3 (three) times daily with meals.     . Cyanocobalamin (VITAMIN B 12 PO) Take 1 tablet by mouth daily.    . cycloSPORINE (RESTASIS) 0.05 % ophthalmic emulsion Place 1 drop into both eyes 2 (two) times daily.     . finasteride (PROSCAR) 5 MG tablet Take 5 mg by mouth daily.    . fish oil-omega-3 fatty acids 1000 MG capsule Take 1 g by mouth daily.     . furosemide (LASIX) 40 MG tablet Take 2 tablets (80 mg total) by mouth 2 (two) times daily. Takes 4 tablets (Patient taking differently: Take 160 mg by mouth 2 (two) times daily. ) 60 tablet 0  . metoprolol (LOPRESSOR) 50 MG tablet Take 50 mg by mouth daily before breakfast.     . Multiple Vitamins-Minerals (OCUVITE PRESERVISION PO) Take 1 tablet by mouth daily.    . pantoprazole (PROTONIX) 40 MG tablet Take 1 tablet (40 mg total) by mouth 2 (two) times daily. 60 tablet 0  . rOPINIRole (REQUIP) 2 MG tablet Take 1 tablet (2 mg total) by mouth 2 (two) times daily. 30 tablet 0  . warfarin (COUMADIN) 5 MG tablet Take 1 tablet (5 mg total) by mouth daily. (Patient taking differently: Take 2.5-5 mg by mouth daily at 6 PM. Patient takes 1 tablet daily except 1/2 tablet(2.5mg ) on Monday and Friday) 90 tablet 1  . [DISCONTINUED] polysaccharide iron (NIFEREX) 150 MG CAPS capsule Take 1 capsule (150 mg total) by mouth daily. For one month then stop. 30 each 0   No current facility-administered medications for this visit.    Past Medical History  Diagnosis Date  . Shortness of breath with exertion  . Chronic kidney disease     not on dialysis yet  . Coronary artery disease 05/14/2010    stress test - no scintigraphic evidence of inducible myocardial ischemia,; normal study  . Hypertension   . Arthritis   . Blood dyscrasia     one time had low platlet count  . Dysrhythmia     atrial fib  . SSS (sick sinus syndrome) 08/27/2011    echo EF >55%, severe LAE, moderate RAE, tissue AVR w/ gradients 35 and 17mmHg  . Pericardial effusion 03/12/2011    echo EF 55-60%  . Claudication 02/19/2012     LE doppler no evidence of arterial insufficiency, lower extremities demonstrate normal values  w/ no evidence of insufficiency  . Lupus nephritis   . Pacemaker 09/19/2004    implanted  . Restless leg syndrome   . Cellulitis   . Anemia   . Benign prostatic hypertrophy   . Constipation   . Shoulder joint pain   . Acute on chronic diastolic CHF (congestive heart failure), NYHA class 3 12/07/2013    Past Surgical History  Procedure Laterality Date  . Back surgery    . Appendectomy    . Hernia repair    . Pacemaker insertion  2006    Medtronic EnRhythm  . Av fistula placement  02/24/2011  . Aortic valve replacement  02/24/2011    pericardial tissue valve [facility]ase  . Coronary artery bypass graft  02/24/2011    LIMA to LAD, SVG to distal  RCA  . Cardiac catheterization  02/13/2011    mod/severe aortic valve stenosis w peak to peak gradient 25-32 mmHg,  70% stenosis LAD, 30-40%proximal followed by 90% sstenosis in RCA prior to anterior RV margin branch  . Lumbar laminectomy/decompression microdiscectomy Right 04/16/2012    Procedure: DECOMPRESSIVE L4 - L5/ MICRODISCECTOMY ON THE RIGHT 1 LEVEL;  Surgeon: Jacki Cones, MD;  Location: [facility] ORS;  Service: Orthopedics;  Laterality: Right;  . Esophagogastroduodenoscopy N/A 06/21/2013    Procedure: ESOPHAGOGASTRODUODENOSCOPY (EGD);  Surgeon: Theda Belfast, MD;  Location: [facility] ENDOSCOPY;  Service: Endoscopy;  Laterality: N/A;  . Givens capsule study N/A 06/21/2013    Procedure: GIVENS CAPSULE STUDY;  Surgeon: Theda Belfast, MD;  Location: [facility] ENDOSCOPY;  Service: Endoscopy;  Laterality: N/A;  . Cardioversion N/A 12/09/2013    Procedure: CARDIOVERSION;  Surgeon: Thurmon Fair, MD;  Location: [facility] ENDOSCOPY;  Service: Cardiovascular;  Laterality: N/A;  . Left and right heart catheterization with coronary angiogram N/A 02/13/2011    Procedure: LEFT AND RIGHT HEART CATHETERIZATION WITH CORONARY ANGIOGRAM;  Surgeon: Lennette Bihari, MD;  Location: [facility] CATH  LAB;  Service: Cardiovascular;  Laterality: N/A;  . Permanent pacemaker generator change N/A 05/10/2013    Procedure: PERMANENT PACEMAKER GENERATOR CHANGE;  Surgeon: Thurmon Fair, MD;  Location: [facility] CATH LAB;  Service: Cardiovascular;  Laterality: N/A;    Family History  Problem Relation Age of Onset  . Hypertension Mother   . Kidney disease Brother     History   Social History  . Marital Status: Married    Spouse Name: [name]    Number of Children: 1  . Years of Education: 6   Occupational History  . Not on file.   Social History Main Topics  . Smoking status: Former Smoker    Types: Cigarettes    Quit date: 02/11/1957  . Smokeless tobacco: Never Used  . Alcohol Use: No  . Drug Use: No  . Sexual Activity: Not on file   Other Topics Concern  . Not on file   Social History Narrative   Patient is married ([name]).   Patient has one child.   Patient does not drink caffeine.   Patient is right-handed.   Patient is retired.   Patient has a 6th grade education.          Review of systems: NYHA class II exertional dyspnea. Edema has improved. Denies angina or syncope or recent bleeding. He has not been weighing himself at home.  The patient specifically denies any chest pain at rest or with exertion, dyspnea at rest, orthopnea, paroxysmal nocturnal dyspnea, syncope, palpitations, focal neurological deficits, intermittent claudication, cough, hemoptysis or wheezing.  The patient also denies abdominal pain, nausea, vomiting, dysphagia, diarrhea, constipation, polyuria, polydipsia, dysuria, hematuria, frequency, urgency, abnormal bleeding or bruising, fever, chills, unexpected weight changes, mood swings, change in skin or hair texture, change in voice quality, auditory or visual problems, allergic reactions or rashes, new musculoskeletal complaints other than usual "aches and pains".  PHYSICAL EXAM BP 130/86 mmHg  Pulse 89  Resp 16  Ht  (1.702 m)  Wt 173 lb 3.2 oz  (78.563 kg)  BMI 27.12 kg/m2 General: Alert, oriented x3, no distress  Head: no evidence of trauma, PERRL, EOMI, no exophtalmos or lid lag, no myxedema, no xanthelasma; normal ears, nose and oropharynx  Neck: normal jugular venous pulsations and no hepatojugular reflux; brisk carotid pulses without delay and faint bilateral carotid bruits  Chest: clear to auscultation, no signs  of consolidation by percussion or palpation, normal fremitus, symmetrical and full respiratory excursions; healed sternotomy, healthy right subclavian pacemaker site. Small (4-6 cm diameter) ecchymoses on left lateral chest. No tenderness. Cardiovascular: normal position and quality of the apical impulse, regular rhythm, normal first and paradoxically split second heart sounds, no rubs or gallops, early peaking grade 2/6 systolic ejection murmur, no diastolic murmur  Abdomen: moderate distention - looks like ascites, no masses by palpation, no abnormal pulsatility or arterial bruits, normal bowel sounds, unable to palpate liver  Extremities: Large tortuous left forearm AV fistula with excellent thrill and bruit ; no clubbing, cyanosis; 3-4+ symmetrical soft pitting edema; 2+ radial, ulnar and brachial pulses bilaterally; 2+ right femoral, posterior tibial and dorsalis pedis pulses; 2+ left femoral, posterior tibial and dorsalis pedis pulses; no subclavian or femoral bruits  Neurological: grossly nonfocal except mild resting tremor  EKG: AV sequential paced  Lipid Panel     Component Value Date/Time   CHOL 190 07/08/2012 0955   TRIG 164* 07/08/2012 0955   HDL 28* 07/08/2012 0955   CHOLHDL 6.8 07/08/2012 0955   VLDL 33 07/08/2012 0955   LDLCALC 129* 07/08/2012 0955    BMET    Component Value Date/Time   NA 140 01/11/2014 1100   K 4.5 01/11/2014 1100   CL 107 01/11/2014 1100   CO2 25 01/11/2014 1100   GLUCOSE 84 01/11/2014 1100   BUN 44* 01/11/2014 1100   CREATININE 2.62* 01/11/2014 1100   CREATININE 3.72*  12/25/2013 0553   CALCIUM 9.5 01/11/2014 1100   GFRNONAA 14* 12/25/2013 0553   GFRAA 16* 12/25/2013 0553     ASSESSMENT AND PLAN Chronic diastolic heart failure Dyspnea improved following cardioversion. Metolazone stopped for hypovolemia.  Pacemaker, MDT implanted 08/06, generator change April 2015 Normal device function today. Motor check in 3 months. Office follow-up in 6 months  PAF (paroxysmal atrial fibrillation)and sustained atrial flutter Repeat attempts at overdrive pacing with bursts with cycle lengths starting at 260 ms and progressing all the way down to 180 ms were all unsuccessful. There was never convincing evidence of entrainment of the arrhythmia. He may have a left atrial flutter. Continue anti-coagulation.  Should target keeping the INR on the lower range (2.0-2.5) due to his history of bleeding problems and anemia due to renal disease. normal liver function tests in December and mildly elevated TSH with borderline decreased free T4 and T3 in December. Need to reevaluate thyroid function periodically. No clinical evidence of hypothyroidism at this time.  S/P CABG x 2: (LIMA-LAD SVG - RCA) No symptoms of active coronary insufficiency. Last nuclear study 2012  AVR 23 mm bioprosthesis Normal function by most recent echocardiogram less than one year ago (peak gradient 26, mean gradient 15).  CKD stage 4 Already has a well developed AV fistula. The kidney disease pretty much limits [place] to amiodarone as the only antiarrhythmic choice.  Orders Placed This Encounter  Procedures  . EKG 12-Lead   Meds ordered this encounter  Medications  . amiodarone (PACERONE) 200 MG tablet    Sig: Take 1 tablet (200 mg total) by mouth daily.    Dispense:  60 tablet    Refill:  [address] [place], [facility] [phone] office [phone] pager

## 2014-02-19 LAB — POCT INR: INR: 3.1

## 2014-02-24 ENCOUNTER — Ambulatory Visit: Payer: Medicare Other | Admitting: Pharmacist Clinician (PhC)/ Clinical Pharmacy Specialist

## 2014-02-28 ENCOUNTER — Encounter (HOSPITAL_COMMUNITY)
Admit: 2014-02-28 | Discharge: 2014-02-28 | Disposition: A | Discharge status: Home / Self Care | Payer: Medicare Other | Attending: Chiropractic Medicine | Admitting: Chiropractic Medicine

## 2014-02-28 ENCOUNTER — Encounter (HOSPITAL_COMMUNITY)
Admit: 2014-02-28 | Discharge: 2014-02-28 | Disposition: A | Discharge status: Home / Self Care | Payer: Medicare Other | Source: Ambulatory Visit | Attending: Nephrology | Admitting: Nephrology

## 2014-02-28 ENCOUNTER — Encounter (HOSPITAL_COMMUNITY): Payer: Self-pay

## 2014-02-28 DIAGNOSIS — N184 Chronic kidney disease, stage 4 (severe): Secondary | ICD-10-CM | POA: Diagnosis not present

## 2014-02-28 DIAGNOSIS — D631 Anemia in chronic kidney disease: Secondary | ICD-10-CM | POA: Insufficient documentation

## 2014-02-28 LAB — HEMOGLOBIN AND HEMATOCRIT, BLOOD
HEMATOCRIT: 27.9 % — AB (ref 39.0–52.0)
Hemoglobin: 8.6 g/dL — ABNORMAL LOW (ref 13.0–17.0)

## 2014-02-28 LAB — IRON AND TIBC
Iron: 37 ug/dL — ABNORMAL LOW (ref 42–165)
SATURATION RATIOS: 15 % — AB (ref 20–55)
TIBC: 252 ug/dL (ref 215–435)
UIBC: 215 ug/dL (ref 125–400)

## 2014-02-28 LAB — FERRITIN: Ferritin: 239 ng/mL (ref 22–322)

## 2014-02-28 MED ORDER — DARBEPOETIN ALFA 100 MCG/0.5ML IJ SOSY
200.0000 ug | PREFILLED_SYRINGE | INTRAMUSCULAR | Status: DC
  Administered 2014-02-28: 200 ug via SUBCUTANEOUS
  Filled 2014-02-28: qty 1

## 2014-03-01 ENCOUNTER — Ambulatory Visit (INDEPENDENT_AMBULATORY_CARE_PROVIDER_SITE_OTHER): Payer: Medicare Other | Admitting: Pharmacist Clinician (PhC)/ Clinical Pharmacy Specialist

## 2014-03-01 DIAGNOSIS — I480 Paroxysmal atrial fibrillation: Secondary | ICD-10-CM

## 2014-03-01 DIAGNOSIS — I4892 Unspecified atrial flutter: Secondary | ICD-10-CM

## 2014-03-01 DIAGNOSIS — Z7901 Long term (current) use of anticoagulants: Secondary | ICD-10-CM

## 2014-03-01 LAB — POCT INR: INR: 3.2

## 2014-03-02 ENCOUNTER — Encounter (HOSPITAL_COMMUNITY)
Admission: RE | Admit: 2014-03-02 | Discharge: 2014-03-02 | Disposition: A | Discharge status: Home / Self Care | Payer: Medicare Other | Source: Ambulatory Visit | Attending: Nephrology | Admitting: Nephrology

## 2014-03-02 ENCOUNTER — Other Ambulatory Visit (HOSPITAL_COMMUNITY): Payer: Medicare Other

## 2014-03-02 DIAGNOSIS — D631 Anemia in chronic kidney disease: Secondary | ICD-10-CM | POA: Diagnosis not present

## 2014-03-02 MED ORDER — SODIUM CHLORIDE 0.9 % IV SOLN
INTRAVENOUS | Status: DC
  Administered 2014-03-02: 200 mL via INTRAVENOUS

## 2014-03-02 MED ORDER — SODIUM CHLORIDE 0.9 % IV SOLN
200.0000 mg | Freq: Once | INTRAVENOUS | Status: AC
  Administered 2014-03-02: 200 mg via INTRAVENOUS
  Filled 2014-03-02: qty 10

## 2014-03-09 ENCOUNTER — Encounter (HOSPITAL_COMMUNITY)
Admission: RE | Admit: 2014-03-09 | Discharge: 2014-03-09 | Disposition: A | Discharge status: Home / Self Care | Payer: Medicare Other | Source: Ambulatory Visit | Attending: Nephrology | Admitting: Nephrology

## 2014-03-09 DIAGNOSIS — D631 Anemia in chronic kidney disease: Secondary | ICD-10-CM | POA: Diagnosis not present

## 2014-03-09 MED ORDER — SODIUM CHLORIDE 0.9 % IV SOLN
INTRAVENOUS | Status: DC
  Administered 2014-03-09: 200 mL via INTRAVENOUS

## 2014-03-09 MED ORDER — IRON SUCROSE 20 MG/ML IV SOLN
200.0000 mg | Freq: Once | INTRAVENOUS | Status: AC
  Administered 2014-03-09: 200 mg via INTRAVENOUS
  Filled 2014-03-09: qty 10

## 2014-03-15 ENCOUNTER — Ambulatory Visit (INDEPENDENT_AMBULATORY_CARE_PROVIDER_SITE_OTHER): Payer: Medicare Other | Admitting: Pharmacist Clinician (PhC)/ Clinical Pharmacy Specialist

## 2014-03-15 DIAGNOSIS — I4892 Unspecified atrial flutter: Secondary | ICD-10-CM

## 2014-03-15 DIAGNOSIS — I480 Paroxysmal atrial fibrillation: Secondary | ICD-10-CM

## 2014-03-15 DIAGNOSIS — Z7901 Long term (current) use of anticoagulants: Secondary | ICD-10-CM

## 2014-03-16 ENCOUNTER — Encounter (HOSPITAL_COMMUNITY)
Admission: RE | Admit: 2014-03-16 | Discharge: 2014-03-16 | Disposition: A | Discharge status: Home / Self Care | Payer: Medicare Other | Source: Ambulatory Visit | Attending: Nephrology | Admitting: Nephrology

## 2014-03-16 DIAGNOSIS — D631 Anemia in chronic kidney disease: Secondary | ICD-10-CM | POA: Diagnosis present

## 2014-03-16 DIAGNOSIS — N184 Chronic kidney disease, stage 4 (severe): Secondary | ICD-10-CM | POA: Diagnosis not present

## 2014-03-16 MED ORDER — SODIUM CHLORIDE 0.9 % IV SOLN
Freq: Once | INTRAVENOUS | Status: AC
  Administered 2014-03-16: 200 mL via INTRAVENOUS

## 2014-03-16 MED ORDER — SODIUM CHLORIDE 0.9 % IV SOLN
200.0000 mg | Freq: Once | INTRAVENOUS | Status: AC
  Administered 2014-03-16: 200 mg via INTRAVENOUS
  Filled 2014-03-16: qty 10

## 2014-03-16 NOTE — Progress Notes (Signed)
Arrived to last dose iv venofer. States he is weaker today and had i loose black stool yesterday. Told to inform Dr Eliott Nineunham ASAP.

## 2014-03-16 NOTE — Discharge Instructions (Signed)
Iron Sucrose injection  What is this medicine?  IRON SUCROSE (AHY ern SOO krohs) is an iron complex. Iron is used to make healthy red blood cells, which carry oxygen and nutrients throughout the body. This medicine is used to treat iron deficiency anemia in people with chronic kidney disease.  This medicine may be used for other purposes; ask your health care provider or pharmacist if you have questions.  COMMON BRAND NAME(S): Venofer  What should I tell my health care provider before I take this medicine?  They need to know if you have any of these conditions:  -anemia not caused by low iron levels  -heart disease  -high levels of iron in the blood  -kidney disease  -liver disease  -an unusual or allergic reaction to iron, other medicines, foods, dyes, or preservatives  -pregnant or trying to get pregnant  -breast-feeding  How should I use this medicine?  This medicine is for infusion into a vein. It is given by a health care professional in a hospital or clinic setting.  Talk to your pediatrician regarding the use of this medicine in children. While this drug may be prescribed for children as young as 2 years for selected conditions, precautions do apply.  Overdosage: If you think you have taken too much of this medicine contact a poison control center or emergency room at once.  NOTE: This medicine is only for you. Do not share this medicine with others.  What if I miss a dose?  It is important not to miss your dose. Call your doctor or health care professional if you are unable to keep an appointment.  What may interact with this medicine?  Do not take this medicine with any of the following medications:  -deferoxamine  -dimercaprol  -other iron products  This medicine may also interact with the following medications:  -chloramphenicol  -deferasirox  This list may not describe all possible interactions. Give your health care provider a list of all the medicines, herbs, non-prescription drugs, or dietary  supplements you use. Also tell them if you smoke, drink alcohol, or use illegal drugs. Some items may interact with your medicine.  What should I watch for while using this medicine?  Visit your doctor or healthcare professional regularly. Tell your doctor or healthcare professional if your symptoms do not start to get better or if they get worse. You may need blood work done while you are taking this medicine.  You may need to follow a special diet. Talk to your doctor. Foods that contain iron include: whole grains/cereals, dried fruits, beans, or peas, leafy green vegetables, and organ meats (liver, kidney).  What side effects may I notice from receiving this medicine?  Side effects that you should report to your doctor or health care professional as soon as possible:  -allergic reactions like skin rash, itching or hives, swelling of the face, lips, or tongue  -breathing problems  -changes in blood pressure  -cough  -fast, irregular heartbeat  -feeling faint or lightheaded, falls  -fever or chills  -flushing, sweating, or hot feelings  -joint or muscle aches/pains  -seizures  -swelling of the ankles or feet  -unusually weak or tired  Side effects that usually do not require medical attention (report to your doctor or health care professional if they continue or are bothersome):  -diarrhea  -feeling achy  -headache  -irritation at site where injected  -nausea, vomiting  -stomach upset  -tiredness  This list may not describe all possible   side effects. Call your doctor for medical advice about side effects. You may report side effects to FDA at 1-800-FDA-1088.  Where should I keep my medicine?  This drug is given in a hospital or clinic and will not be stored at home.  NOTE: This sheet is a summary. It may not cover all possible information. If you have questions about this medicine, talk to your doctor, pharmacist, or health care provider.   2015, Elsevier/Gold Standard. (2010-10-17 17:14:35)

## 2014-03-17 ENCOUNTER — Inpatient Hospital Stay (HOSPITAL_COMMUNITY)
Admission: EM | Admit: 2014-03-17 | Discharge: 2014-03-18 | DRG: 812 | Disposition: A | Discharge status: Home / Self Care | Payer: Medicare Other | Attending: Internal Medicine | Admitting: Internal Medicine

## 2014-03-17 ENCOUNTER — Encounter (HOSPITAL_COMMUNITY): Payer: Self-pay | Admitting: Emergency Medicine

## 2014-03-17 DIAGNOSIS — N400 Enlarged prostate without lower urinary tract symptoms: Secondary | ICD-10-CM | POA: Diagnosis present

## 2014-03-17 DIAGNOSIS — G2581 Restless legs syndrome: Secondary | ICD-10-CM | POA: Diagnosis present

## 2014-03-17 DIAGNOSIS — D649 Anemia, unspecified: Secondary | ICD-10-CM | POA: Diagnosis present

## 2014-03-17 DIAGNOSIS — Z7982 Long term (current) use of aspirin: Secondary | ICD-10-CM

## 2014-03-17 DIAGNOSIS — Z87891 Personal history of nicotine dependence: Secondary | ICD-10-CM | POA: Diagnosis not present

## 2014-03-17 DIAGNOSIS — Z951 Presence of aortocoronary bypass graft: Secondary | ICD-10-CM

## 2014-03-17 DIAGNOSIS — M1990 Unspecified osteoarthritis, unspecified site: Secondary | ICD-10-CM | POA: Diagnosis present

## 2014-03-17 DIAGNOSIS — Z7901 Long term (current) use of anticoagulants: Secondary | ICD-10-CM | POA: Diagnosis not present

## 2014-03-17 DIAGNOSIS — I129 Hypertensive chronic kidney disease with stage 1 through stage 4 chronic kidney disease, or unspecified chronic kidney disease: Secondary | ICD-10-CM | POA: Diagnosis present

## 2014-03-17 DIAGNOSIS — I2510 Atherosclerotic heart disease of native coronary artery without angina pectoris: Secondary | ICD-10-CM | POA: Diagnosis present

## 2014-03-17 DIAGNOSIS — Z950 Presence of cardiac pacemaker: Secondary | ICD-10-CM

## 2014-03-17 DIAGNOSIS — N184 Chronic kidney disease, stage 4 (severe): Secondary | ICD-10-CM | POA: Diagnosis present

## 2014-03-17 DIAGNOSIS — M329 Systemic lupus erythematosus, unspecified: Secondary | ICD-10-CM | POA: Diagnosis present

## 2014-03-17 DIAGNOSIS — E876 Hypokalemia: Secondary | ICD-10-CM | POA: Diagnosis present

## 2014-03-17 DIAGNOSIS — Z952 Presence of prosthetic heart valve: Secondary | ICD-10-CM

## 2014-03-17 DIAGNOSIS — N179 Acute kidney failure, unspecified: Secondary | ICD-10-CM | POA: Diagnosis present

## 2014-03-17 DIAGNOSIS — I480 Paroxysmal atrial fibrillation: Secondary | ICD-10-CM | POA: Diagnosis present

## 2014-03-17 DIAGNOSIS — Z8249 Family history of ischemic heart disease and other diseases of the circulatory system: Secondary | ICD-10-CM

## 2014-03-17 DIAGNOSIS — I4821 Permanent atrial fibrillation: Secondary | ICD-10-CM | POA: Diagnosis present

## 2014-03-17 LAB — CBC WITH DIFFERENTIAL/PLATELET
Basophils Absolute: 0 10*3/uL (ref 0.0–0.1)
Basophils Relative: 0 % (ref 0–1)
EOS PCT: 2 % (ref 0–5)
Eosinophils Absolute: 0.1 10*3/uL (ref 0.0–0.7)
HCT: 18.4 % — ABNORMAL LOW (ref 39.0–52.0)
Hemoglobin: 5.9 g/dL — CL (ref 13.0–17.0)
LYMPHS ABS: 0.7 10*3/uL (ref 0.7–4.0)
Lymphocytes Relative: 17 % (ref 12–46)
MCH: 28.9 pg (ref 26.0–34.0)
MCHC: 32.1 g/dL (ref 30.0–36.0)
MCV: 90.2 fL (ref 78.0–100.0)
MONO ABS: 0.3 10*3/uL (ref 0.1–1.0)
Monocytes Relative: 7 % (ref 3–12)
Neutro Abs: 2.9 10*3/uL (ref 1.7–7.7)
Neutrophils Relative %: 73 % (ref 43–77)
Platelets: 91 10*3/uL — ABNORMAL LOW (ref 150–400)
RBC: 2.04 MIL/uL — AB (ref 4.22–5.81)
RDW: 18 % — ABNORMAL HIGH (ref 11.5–15.5)
WBC: 4 10*3/uL (ref 4.0–10.5)

## 2014-03-17 LAB — PROTIME-INR
INR: 4.02 — ABNORMAL HIGH (ref 0.00–1.49)
Prothrombin Time: 39.4 seconds — ABNORMAL HIGH (ref 11.6–15.2)

## 2014-03-17 LAB — BASIC METABOLIC PANEL
Anion gap: 15 (ref 5–15)
BUN: 204 mg/dL — ABNORMAL HIGH (ref 6–23)
CHLORIDE: 90 mmol/L — AB (ref 96–112)
CO2: 29 mmol/L (ref 19–32)
Calcium: 9 mg/dL (ref 8.4–10.5)
Creatinine, Ser: 4.9 mg/dL — ABNORMAL HIGH (ref 0.50–1.35)
GFR calc non Af Amer: 10 mL/min — ABNORMAL LOW (ref >90)
GFR, EST AFRICAN AMERICAN: 11 mL/min — AB (ref >90)
Glucose, Bld: 112 mg/dL — ABNORMAL HIGH (ref 70–99)
POTASSIUM: 3.1 mmol/L — AB (ref 3.5–5.1)
Sodium: 134 mmol/L — ABNORMAL LOW (ref 135–145)

## 2014-03-17 LAB — URINE MICROSCOPIC-ADD ON

## 2014-03-17 LAB — URINALYSIS, ROUTINE W REFLEX MICROSCOPIC
BILIRUBIN URINE: NEGATIVE
Glucose, UA: NEGATIVE mg/dL
Ketones, ur: NEGATIVE mg/dL
NITRITE: NEGATIVE
Protein, ur: NEGATIVE mg/dL
SPECIFIC GRAVITY, URINE: 1.01 (ref 1.005–1.030)
Urobilinogen, UA: 0.2 mg/dL (ref 0.0–1.0)
pH: 5.5 (ref 5.0–8.0)

## 2014-03-17 LAB — RETICULOCYTES
RBC.: 2.01 MIL/uL — ABNORMAL LOW (ref 4.22–5.81)
Retic Count, Absolute: 78.4 10*3/uL (ref 19.0–186.0)
Retic Ct Pct: 3.9 % — ABNORMAL HIGH (ref 0.4–3.1)

## 2014-03-17 LAB — ABO/RH: ABO/RH(D): A POS

## 2014-03-17 LAB — PREPARE RBC (CROSSMATCH)

## 2014-03-17 MED ORDER — METOPROLOL TARTRATE 50 MG PO TABS
50.0000 mg | ORAL_TABLET | Freq: Every day | ORAL | Status: DC
  Administered 2014-03-18: 50 mg via ORAL
  Filled 2014-03-17: qty 1

## 2014-03-17 MED ORDER — OMEGA-3 FATTY ACIDS 1000 MG PO CAPS: 1.0000 g | ORAL_CAPSULE | Freq: Every day | ORAL | Status: DC

## 2014-03-17 MED ORDER — ROPINIROLE HCL 1 MG PO TABS
ORAL_TABLET | ORAL | Status: AC
  Filled 2014-03-17: qty 2

## 2014-03-17 MED ORDER — OXYCODONE HCL 5 MG PO TABS
5.0000 mg | ORAL_TABLET | Freq: Once | ORAL | Status: AC
  Administered 2014-03-18: 5 mg via ORAL
  Filled 2014-03-17: qty 1

## 2014-03-17 MED ORDER — ONDANSETRON HCL 4 MG PO TABS: 4.0000 mg | ORAL_TABLET | Freq: Four times a day (QID) | ORAL | Status: DC | PRN

## 2014-03-17 MED ORDER — WARFARIN SODIUM 5 MG PO TABS: 2.5000 mg | ORAL_TABLET | Freq: Every day | ORAL | Status: DC

## 2014-03-17 MED ORDER — FUROSEMIDE 80 MG PO TABS
80.0000 mg | ORAL_TABLET | Freq: Two times a day (BID) | ORAL | Status: DC
  Administered 2014-03-18 (×2): 80 mg via ORAL
  Filled 2014-03-17 (×2): qty 1

## 2014-03-17 MED ORDER — PANTOPRAZOLE SODIUM 40 MG PO TBEC
40.0000 mg | DELAYED_RELEASE_TABLET | Freq: Two times a day (BID) | ORAL | Status: DC
  Administered 2014-03-17 – 2014-03-18 (×2): 40 mg via ORAL
  Filled 2014-03-17 (×2): qty 1

## 2014-03-17 MED ORDER — CALCIUM ACETATE 667 MG PO CAPS
667.0000 mg | ORAL_CAPSULE | Freq: Every day | ORAL | Status: DC
  Administered 2014-03-18: 667 mg via ORAL
  Filled 2014-03-17 (×4): qty 1

## 2014-03-17 MED ORDER — ASPIRIN EC 81 MG PO TBEC
81.0000 mg | DELAYED_RELEASE_TABLET | Freq: Every morning | ORAL | Status: DC
  Administered 2014-03-18: 81 mg via ORAL
  Filled 2014-03-17: qty 1

## 2014-03-17 MED ORDER — CALCITRIOL 0.25 MCG PO CAPS
0.2500 ug | ORAL_CAPSULE | Freq: Every day | ORAL | Status: DC
  Administered 2014-03-18: 0.25 ug via ORAL
  Filled 2014-03-17: qty 1

## 2014-03-17 MED ORDER — ONDANSETRON HCL 4 MG/2ML IJ SOLN: 4.0000 mg | Freq: Four times a day (QID) | INTRAMUSCULAR | Status: DC | PRN

## 2014-03-17 MED ORDER — SODIUM CHLORIDE 0.9 % IV SOLN: Freq: Once | INTRAVENOUS | Status: DC

## 2014-03-17 MED ORDER — SODIUM CHLORIDE 0.9 % IV BOLUS (SEPSIS)
500.0000 mL | Freq: Once | INTRAVENOUS | Status: AC
  Administered 2014-03-17: 500 mL via INTRAVENOUS

## 2014-03-17 MED ORDER — ROPINIROLE HCL 1 MG PO TABS
2.0000 mg | ORAL_TABLET | Freq: Two times a day (BID) | ORAL | Status: DC
  Administered 2014-03-17 – 2014-03-18 (×2): 2 mg via ORAL
  Filled 2014-03-17 (×6): qty 2

## 2014-03-17 MED ORDER — MORPHINE SULFATE 2 MG/ML IJ SOLN
2.0000 mg | Freq: Once | INTRAMUSCULAR | Status: AC
  Administered 2014-03-17: 2 mg via INTRAVENOUS
  Filled 2014-03-17: qty 1

## 2014-03-17 MED ORDER — FINASTERIDE 5 MG PO TABS
5.0000 mg | ORAL_TABLET | Freq: Every day | ORAL | Status: DC
  Administered 2014-03-18: 5 mg via ORAL
  Filled 2014-03-17 (×4): qty 1

## 2014-03-17 MED ORDER — AMIODARONE HCL 200 MG PO TABS
200.0000 mg | ORAL_TABLET | Freq: Every day | ORAL | Status: DC
  Administered 2014-03-18: 200 mg via ORAL
  Filled 2014-03-17: qty 1

## 2014-03-17 MED ORDER — CYCLOSPORINE 0.05 % OP EMUL
1.0000 [drp] | Freq: Two times a day (BID) | OPHTHALMIC | Status: DC
  Administered 2014-03-18: 1 [drp] via OPHTHALMIC
  Filled 2014-03-17 (×6): qty 1

## 2014-03-17 MED ORDER — OMEGA-3-ACID ETHYL ESTERS 1 G PO CAPS
1.0000 g | ORAL_CAPSULE | Freq: Every day | ORAL | Status: DC
Start: 2014-03-17 — End: 2014-03-18
  Administered 2014-03-17: 1 g via ORAL
  Filled 2014-03-17: qty 1

## 2014-03-17 NOTE — H&P (Signed)
[facility] History and Physical  [name] XLK:[id number] DOB: 10/11/1926 DOA: 03/17/2014  Referring physician: ER PCP: Delorse Lek, MD   Chief Complaint: Weakness  HPI: Nathaniel Steele is a 81 y.o. male  This is an 81 year old man who presents with one-week history of weakness. He went to see his primary care physician and blood work showed that he had a hemoglobin around 5. He was given iron infusion yesterday in the short stay department. Apparently this is the third round of our infusion has been given. He needed 4 units of blood transfusion approximately one year ago for similar symptoms. Patient denies hematuria, hematemesis. He does have blood motions but he is taking iron. He denies any abdominal pain, nausea or vomiting. He has no chest pain, dyspnea or palpitations. He does have a history of chronic kidney disease and access for dialysis but dialysis has not been started yet. Evaluation in the emergency room shows him to have a hemoglobin of 5.9. His creatinine is also elevated at 4.9. He is now being admitted for further management.   Review of Systems:  Apart from symptoms above, all systems negative.  Past Medical History  Diagnosis Date  . Shortness of breath with exertion  . Chronic kidney disease     not on dialysis yet  . Coronary artery disease 05/14/2010    stress test - no scintigraphic evidence of inducible myocardial ischemia,; normal study  . Hypertension   . Arthritis   . Blood dyscrasia     one time had low platlet count  . Dysrhythmia     atrial fib  . SSS (sick sinus syndrome) 08/27/2011    echo EF >55%, severe LAE, moderate RAE, tissue AVR w/ gradients 35 and  . Pericardial effusion 03/12/2011    echo EF 55-60%  . Claudication 02/19/2012    LE doppler no evidence of arterial insufficiency, lower extremities demonstrate normal values  w/ no evidence of insufficiency  . Lupus nephritis   . Pacemaker 09/19/2004    implanted  . Restless  leg syndrome   . Cellulitis   . Anemia   . Benign prostatic hypertrophy   . Constipation   . Shoulder joint pain   . Acute on chronic diastolic CHF (congestive heart failure), NYHA class 3 12/07/2013   Past Surgical History  Procedure Laterality Date  . Back surgery    . Appendectomy    . Hernia repair    . Pacemaker insertion  2006    Medtronic EnRhythm  . Av fistula placement  02/24/2011  . Aortic valve replacement  02/24/2011    pericardial tissue valve [facility] Ease  . Coronary artery bypass graft  02/24/2011    LIMA to LAD, SVG to distal RCA  . Cardiac catheterization  02/13/2011    mod/severe aortic valve stenosis w peak to peak gradient 25-32 mmHg,  70% stenosis LAD, 30-40%proximal followed by 90% sstenosis in RCA prior to anterior RV margin branch  . Lumbar laminectomy/decompression microdiscectomy Right 04/16/2012    Procedure: DECOMPRESSIVE L4 - L5/ MICRODISCECTOMY ON THE RIGHT 1 LEVEL;  Surgeon: Jacki Cones, MD;  Location: [facility] ORS;  Service: Orthopedics;  Laterality: Right;  . Esophagogastroduodenoscopy N/A 06/21/2013    Procedure: ESOPHAGOGASTRODUODENOSCOPY (EGD);  Surgeon: Theda Belfast, MD;  Location: [facility] ENDOSCOPY;  Service: Endoscopy;  Laterality: N/A;  . Givens capsule study N/A 06/21/2013    Procedure: GIVENS CAPSULE STUDY;  Surgeon: Theda Belfast, MD;  Location: [facility] ENDOSCOPY;  Service: Endoscopy;  Laterality: N/A;  . Cardioversion N/A 12/09/2013    Procedure: CARDIOVERSION;  Surgeon: Thurmon FairMihai Croitoru, MD;  Location: [facility] ENDOSCOPY;  Service: Cardiovascular;  Laterality: N/A;  . Left and right heart catheterization with coronary angiogram N/A 02/13/2011    Procedure: LEFT AND RIGHT HEART CATHETERIZATION WITH CORONARY ANGIOGRAM;  Surgeon: Lennette Biharihomas A Kelly, MD;  [facility];  Service: Cardiovascular;  Laterality: N/A;  . Permanent pacemaker generator change N/A 05/10/2013    Procedure: PERMANENT PACEMAKER GENERATOR CHANGE;  Surgeon: Thurmon FairMihai Croitoru, MD;  Location: [facility]  CATH LAB;  Service: Cardiovascular;  Laterality: N/A;   Social History:  reports that he quit smoking about 57 years ago. His smoking use included Cigarettes. He has never used smokeless tobacco. He reports that he does not drink alcohol or use illicit drugs.  Allergies  Allergen Reactions  . Statins Other (See Comments)    myalgias  . Aspirin Hives    Patient states that he can take the enteric coated but not the uncoated  . Feraheme [Ferumoxytol] Rash and Other (See Comments)    Abdominal pain  . Penicillins Hives    Family History  Problem Relation Age of Onset  . Hypertension Mother   . Kidney disease Brother     Prior to Admission medications   Medication Sig Start Date End Date Taking? Authorizing Provider  amiodarone (PACERONE) 200 MG tablet Take 1 tablet (200 mg total) by mouth daily. 02/16/14  Yes Mihai Croitoru, MD  aspirin EC 81 MG tablet Take 81 mg by mouth every morning.    Yes Historical Provider, MD  calcitRIOL (ROCALTROL) 0.25 MCG capsule Take 0.25 mcg by mouth daily before breakfast.    Yes Historical Provider, MD  calcium acetate (PHOSLO) 667 MG capsule Take 667 mg by mouth every morning.    Yes Historical Provider, MD  Cyanocobalamin (VITAMIN B 12 PO) Take 1 tablet by mouth daily.   Yes Historical Provider, MD  cycloSPORINE (RESTASIS) 0.05 % ophthalmic emulsion Place 1 drop into both eyes 2 (two) times daily.    Yes Historical Provider, MD  finasteride (PROSCAR) 5 MG tablet Take 5 mg by mouth daily.   Yes Historical Provider, MD  fish oil-omega-3 fatty acids 1000 MG capsule Take 1 g by mouth daily.    Yes Historical Provider, MD  furosemide (LASIX) 40 MG tablet Take 2 tablets (80 mg total) by mouth 2 (two) times daily. Takes 4 tablets Patient taking differently: Take 160 mg by mouth 2 (two) times daily.  12/25/13  Yes Leroy SeaPrashant K Singh, MD  metoprolol (LOPRESSOR) 50 MG tablet Take 50 mg by mouth daily before breakfast.    Yes Historical Provider, MD  Multiple  Vitamins-Minerals (OCUVITE PRESERVISION PO) Take 1 tablet by mouth daily.   Yes Historical Provider, MD  pantoprazole (PROTONIX) 40 MG tablet Take 1 tablet (40 mg total) by mouth 2 (two) times daily. 06/23/13  Yes Joseph ArtJessica U Vann, DO  rOPINIRole (REQUIP) 2 MG tablet Take 1 tablet (2 mg total) by mouth 2 (two) times daily. 12/13/13  Yes Mihai Croitoru, MD  warfarin (COUMADIN) 5 MG tablet Take 1 tablet (5 mg total) by mouth daily. Patient taking differently: Take 2.5-5 mg by mouth daily at 6 PM. Patient takes 1 tablet daily except 1/2 tablet(2.5mg ) on Monday Wednesday, and Friday 11/18/13  Yes Phillips HayKristin Alvstad, RPH-CPP   Physical Exam: Filed Vitals:   03/17/14 2007 03/17/14 2015 03/17/14 2030 03/17/14 2045  BP: 102/62  95/53   Pulse: 74  70  Temp:      TempSrc:      Resp: 15  16   SpO2: 93% 96% 87% 95%    Wt Readings from Last 3 Encounters:  02/16/14 78.563 kg (173 lb 3.2 oz)  02/13/14 75.297 kg (166 lb)  01/31/14 73.936 kg (163 lb)    General:  Appears calm and comfortable. Looks pale. Somewhat clinically dehydrated.  Eyes: PERRL, normal lids, irises & conjunctiva ENT: grossly normal hearing, lips & tongue Neck: no LAD, masses or thyromegaly Cardiovascular: RRR, no m/r/g. No LE edema. Telemetry: SR, no arrhythmias  Respiratory: CTA bilaterally, no w/r/r. Normal respiratory effort. Abdomen: soft, ntnd Skin: no rash or induration seen on limited exam Musculoskeletal: grossly normal tone BUE/BLE Psychiatric: grossly normal mood and affect, speech fluent and appropriate Neurologic: grossly non-focal.          Labs on Admission:  Basic Metabolic Panel:  Recent Labs Lab 03/17/14 1725  NA 134*  K 3.1*  CL 90*  CO2 29  GLUCOSE 112*  BUN 204*  CREATININE 4.90*  CALCIUM 9.0   Liver Function Tests: No results for input(s): AST, ALT, ALKPHOS, BILITOT, PROT, ALBUMIN in the last 168 hours. No results for input(s): LIPASE, AMYLASE in the last 168 hours. No results for input(s):  AMMONIA in the last 168 hours. CBC:  Recent Labs Lab 03/17/14 1725  WBC 4.0  NEUTROABS 2.9  HGB 5.9*  HCT 18.4*  MCV 90.2  PLT 91*   Cardiac Enzymes: No results for input(s): CKTOTAL, CKMB, CKMBINDEX, TROPONINI in the last 168 hours.  BNP (last 3 results) No results for input(s): BNP in the last 8760 hours.  ProBNP (last 3 results)  Recent Labs  12/22/13 1850  PROBNP 16536.0*    CBG: No results for input(s): GLUCAP in the last 168 hours.  Radiological Exams on Admission: No results found.    Assessment/Plan   1.  Severe anemia. This is a normocytic anemia. There is no obvious sign of GI bleed clinically. We will check an anemia panel. He'll be given 2 units blood transfusion should start and we will repeat his hemoglobin tomorrow. He may well need further blood transfusion tomorrow. We will check stools for fecal occult bloods. He may need gastroenterology consultation again for possible repeat EGD. 2. End-stage renal disease. His last creatinine 2 months ago was 3.72 and this is clearly an increase. His GFR is now 10. He may need dialysis after all. We will ask nephrology to see him. 3. Paroxysmal atrial fibrillation. Patient is on chronic Coumadin therapy. We will ask pharmacy to monitor this. 4. Coronary artery disease, status post CABG. Stable.  Further recommendations will depend on patient's hospital progress.  Code Status:full code.  Family Communication:  I discussed the plan with the patient at the bedside.  Disposition home when medically stable.   Time spent: 60 minutes  [facility] Pager [phone]

## 2014-03-17 NOTE — ED Provider Notes (Signed)
CSN: [id number]     Arrival date & time 03/17/14  1644 History  This chart was scribed for Donnetta Hutching, MD by Abel Presto, ED Scribe. This patient was seen in room APA16A/APA16A and the patient's care was started at 5:36 PM.    Chief Complaint  Patient presents with  . Abnormal Lab   LEVEL 5 CAVEAT- MILD DEMENTIA  The history is provided by the patient, the spouse, a relative and medical records. No language interpreter was used.   HPI Comments: Nathaniel Steele is a 81 y.o. male who presents to the Emergency Department complaining of gereralized weakness for 1 week. Pt was being seen at [facility] today for symptoms with blood work done and pt's Hgb was 5. Pt was seen at [facility] Short Stay yesterday and was administered Fe infusion. Family notes this is his third round of Fe infusion. Pt needed 4 units of blood transfusion about 1 year ago. Pt notes associated melena. Pt denies chest pain.   Past Medical History  Diagnosis Date  . Shortness of breath with exertion  . Chronic kidney disease     not on dialysis yet  . Coronary artery disease 05/14/2010    stress test - no scintigraphic evidence of inducible myocardial ischemia,; normal study  . Hypertension   . Arthritis   . Blood dyscrasia     one time had low platlet count  . Dysrhythmia     atrial fib  . SSS (sick sinus syndrome) 08/27/2011    echo EF >55%, severe LAE, moderate RAE, tissue AVR w/ gradients 35 and  . Pericardial effusion 03/12/2011    echo EF 55-60%  . Claudication 02/19/2012    LE doppler no evidence of arterial insufficiency, lower extremities demonstrate normal values  w/ no evidence of insufficiency  . Lupus nephritis   . Pacemaker 09/19/2004    implanted  . Restless leg syndrome   . Cellulitis   . Anemia   . Benign prostatic hypertrophy   . Constipation   . Shoulder joint pain   . Acute on chronic diastolic CHF (congestive heart failure), NYHA class 3 12/07/2013   Past Surgical History  Procedure  Laterality Date  . Back surgery    . Appendectomy    . Hernia repair    . Pacemaker insertion  2006    Medtronic EnRhythm  . Av fistula placement  02/24/2011  . Aortic valve replacement  02/24/2011    pericardial tissue valve [facility] Ease  . Coronary artery bypass graft  02/24/2011    LIMA to LAD, SVG to distal RCA  . Cardiac catheterization  02/13/2011    mod/severe aortic valve stenosis w peak to peak gradient 25-32 mmHg,  70% stenosis LAD, 30-40%proximal followed by 90% sstenosis in RCA prior to anterior RV margin branch  . Lumbar laminectomy/decompression microdiscectomy Right 04/16/2012    Procedure: DECOMPRESSIVE L4 - L5/ MICRODISCECTOMY ON THE RIGHT 1 LEVEL;  Surgeon: Jacki Cones, MD;  Location: [facility] ORS;  Service: Orthopedics;  Laterality: Right;  . Esophagogastroduodenoscopy N/A 06/21/2013    Procedure: ESOPHAGOGASTRODUODENOSCOPY (EGD);  Surgeon: Theda Belfast, MD;  Location: [facility] ENDOSCOPY;  Service: Endoscopy;  Laterality: N/A;  . Givens capsule study N/A 06/21/2013    Procedure: GIVENS CAPSULE STUDY;  Surgeon: Theda Belfast, MD;  Location: [facility] ENDOSCOPY;  Service: Endoscopy;  Laterality: N/A;  . Cardioversion N/A 12/09/2013    Procedure: CARDIOVERSION;  Surgeon: Thurmon Fair, MD;  Location: [facility] ENDOSCOPY;  Service: Cardiovascular;  Laterality:  N/A;  . Left and right heart catheterization with coronary angiogram N/A 02/13/2011    Procedure: LEFT AND RIGHT HEART CATHETERIZATION WITH CORONARY ANGIOGRAM;  Surgeon: Lennette Biharihomas A Kelly, MD;  [facility];  Service: Cardiovascular;  Laterality: N/A;  . Permanent pacemaker generator change N/A 05/10/2013    Procedure: PERMANENT PACEMAKER GENERATOR CHANGE;  Surgeon: Thurmon FairMihai Croitoru, MD;  Location: [facility] CATH LAB;  Service: Cardiovascular;  Laterality: N/A;   Family History  Problem Relation Age of Onset  . Hypertension Mother   . Kidney disease Brother    History  Substance Use Topics  . Smoking status: Former Smoker    Types:  Cigarettes    Quit date: 02/11/1957  . Smokeless tobacco: Never Used  . Alcohol Use: No    Review of Systems A complete 10 system review of systems was obtained and all systems are negative except as noted in the HPI and PMH.     Allergies  Statins; Aspirin; Feraheme; and Penicillins  Home Medications   Prior to Admission medications   Medication Sig Start Date End Date Taking? Authorizing Provider  amiodarone (PACERONE) 200 MG tablet Take 1 tablet (200 mg total) by mouth daily. 02/16/14   Mihai Croitoru, MD  aspirin EC 81 MG tablet Take 81 mg by mouth daily.    Historical Provider, MD  calcitRIOL (ROCALTROL) 0.25 MCG capsule Take 0.25 mcg by mouth daily before breakfast.     Historical Provider, MD  calcium acetate (PHOSLO) 667 MG capsule Take 667 mg by mouth 3 (three) times daily with meals.    Historical Provider, MD  Cyanocobalamin (VITAMIN B 12 PO) Take 1 tablet by mouth daily.    Historical Provider, MD  cycloSPORINE (RESTASIS) 0.05 % ophthalmic emulsion Place 1 drop into both eyes 2 (two) times daily.     Historical Provider, MD  finasteride (PROSCAR) 5 MG tablet Take 5 mg by mouth daily.    Historical Provider, MD  fish oil-omega-3 fatty acids 1000 MG capsule Take 1 g by mouth daily.     Historical Provider, MD  furosemide (LASIX) 40 MG tablet Take 2 tablets (80 mg total) by mouth 2 (two) times daily. Takes 4 tablets Patient taking differently: Take 160 mg by mouth 2 (two) times daily.  12/25/13   Leroy SeaPrashant K Singh, MD  metoprolol (LOPRESSOR) 50 MG tablet Take 50 mg by mouth daily before breakfast.     Historical Provider, MD  Multiple Vitamins-Minerals (OCUVITE PRESERVISION PO) Take 1 tablet by mouth daily.    Historical Provider, MD  pantoprazole (PROTONIX) 40 MG tablet Take 1 tablet (40 mg total) by mouth 2 (two) times daily. 06/23/13   Joseph ArtJessica U Vann, DO  rOPINIRole (REQUIP) 2 MG tablet Take 1 tablet (2 mg total) by mouth 2 (two) times daily. 12/13/13   Mihai Croitoru, MD   warfarin (COUMADIN) 5 MG tablet Take 1 tablet (5 mg total) by mouth daily. Patient taking differently: Take 2.5-5 mg by mouth daily at 6 PM. Patient takes 1 tablet daily except 1/2 tablet(2.5mg ) on Monday and Friday 11/18/13   Phillips HayKristin Alvstad, RPH-CPP   BP 102/62 mmHg  Pulse 71  Temp(Src) 97.5 F (36.4 C) (Oral)  Resp 15  SpO2 91% Physical Exam  Constitutional: He is oriented to person, place, and time. He appears well-developed and well-nourished. No distress.  HENT:  Head: Normocephalic and atraumatic.  Eyes: Conjunctivae and EOM are normal. Pupils are equal, round, and reactive to light.  Neck: Normal range of motion. Neck supple.  Cardiovascular: Normal rate and regular rhythm.   Pulmonary/Chest: Effort normal and breath sounds normal.  Abdominal: Soft. Bowel sounds are normal.  Musculoskeletal: Normal range of motion.  Neurological: He is alert and oriented to person, place, and time.  Skin: Skin is warm and dry.  Psychiatric: He has a normal mood and affect. His behavior is normal.  Nursing note and vitals reviewed.   ED Course  Procedures (including critical care time) DIAGNOSTIC STUDIES: Oxygen Saturation is 100% on room air, normal by my interpretation.    COORDINATION OF CARE: 5:40 PM Discussed treatment plan with patient at beside, the patient agrees with the plan and has no further questions at this time.   Labs Review Labs Reviewed  CBC WITH DIFFERENTIAL/PLATELET - Abnormal; Notable for the following:    RBC 2.04 (*)    Hemoglobin 5.9 (*)    HCT 18.4 (*)    RDW 18.0 (*)    Platelets 91 (*)    All other components within normal limits  BASIC METABOLIC PANEL - Abnormal; Notable for the following:    Sodium 134 (*)    Potassium 3.1 (*)    Chloride 90 (*)    Glucose, Bld 112 (*)    BUN 204 (*)    Creatinine, Ser 4.90 (*)    GFR calc non Af Amer 10 (*)    GFR calc Af Amer 11 (*)    All other components within normal limits  PROTIME-INR - Abnormal; Notable  for the following:    Prothrombin Time 39.4 (*)    INR 4.02 (*)    All other components within normal limits  URINALYSIS, ROUTINE W REFLEX MICROSCOPIC  POC OCCULT BLOOD, ED  TYPE AND SCREEN  ABO/RH  PREPARE RBC (CROSSMATCH)    Imaging Review No results found.   EKG Interpretation   Date/Time:  Friday March 17 2014 17:15:26 EST Ventricular Rate:  76 PR Interval:  177 QRS Duration: 255 QT Interval:  571 QTC Calculation: 642 R Axis:   -73 Text Interpretation:  Sinus or ectopic atrial rhythm Left bundle branch  block Inferior infarct, acute (RCA) Probable RV involvement, suggest  recording right precordial leads Confirmed by Brookelin Felber  MD, Lendora Keys ([id number]) on  03/17/2014 6:03:45 PM      MDM   Final diagnoses:  Anemia, unspecified anemia type   patient has had long-standing anemia. He has felt weak for 1 week.  Hemoglobin was 5.9.   Will transfuse 2 units of packed cells. Admit to observation   I personally performed the services described in this documentation, which was scribed in my presence. The recorded information has been reviewed and is accurate.     Donnetta Hutching, MD 03/17/14 [phone]

## 2014-03-17 NOTE — ED Notes (Signed)
Pt request pain medication for leg pain. EDP aware

## 2014-03-17 NOTE — ED Notes (Addendum)
Pt reports weakness x2 weeks. pcp office told pt that his hemoglobin was 5. Pt denies cp but reports sob with exertion. Pt denies any known bleeding but reports is taking iron infusion x3 weeks. Last one yesterday in short stay. Pt alert and oriented. nad noted.pt family also reports pt is on coumadin.

## 2014-03-17 NOTE — Progress Notes (Signed)
Pharmacy Note:  Coumadin Protocol  No Coumadin tonight.  INR > 4.   F/U INR tomorrow.  Valrie HartScott Tysha Grismore, PharmD

## 2014-03-17 NOTE — ED Notes (Signed)
CRITICAL VALUE ALERT  Critical value received:  Hemoglobin 5.9  Date of notification:  03/17/2014  Time of notification:  1750  Critical value read back:Yes.    Nurse who received alert:  [facility] RN  MD notified (1st page):  Dr. Adriana Simasook  Time of first page:  1750  MD notified (2nd page):  Time of second page:  Responding MD:  Dr. Adriana Simasook  Time MD responded:  [phone]

## 2014-03-17 NOTE — ED Notes (Addendum)
Pt resting comfortably, blood transfusing. No reaction suspected.

## 2014-03-18 DIAGNOSIS — N189 Chronic kidney disease, unspecified: Secondary | ICD-10-CM

## 2014-03-18 DIAGNOSIS — D631 Anemia in chronic kidney disease: Secondary | ICD-10-CM

## 2014-03-18 DIAGNOSIS — G2581 Restless legs syndrome: Secondary | ICD-10-CM

## 2014-03-18 DIAGNOSIS — I480 Paroxysmal atrial fibrillation: Secondary | ICD-10-CM

## 2014-03-18 LAB — PROTIME-INR
INR: 4.34 — ABNORMAL HIGH (ref 0.00–1.49)
Prothrombin Time: 41.9 seconds — ABNORMAL HIGH (ref 11.6–15.2)

## 2014-03-18 LAB — COMPREHENSIVE METABOLIC PANEL
ALBUMIN: 3.4 g/dL — AB (ref 3.5–5.2)
ALK PHOS: 37 U/L — AB (ref 39–117)
ALT: 17 U/L (ref 0–53)
AST: 24 U/L (ref 0–37)
Anion gap: 17 — ABNORMAL HIGH (ref 5–15)
BUN: 186 mg/dL — ABNORMAL HIGH (ref 6–23)
CO2: 29 mmol/L (ref 19–32)
CREATININE: 4.6 mg/dL — AB (ref 0.50–1.35)
Calcium: 9 mg/dL (ref 8.4–10.5)
Chloride: 90 mmol/L — ABNORMAL LOW (ref 96–112)
GFR calc non Af Amer: 10 mL/min — ABNORMAL LOW (ref >90)
GFR, EST AFRICAN AMERICAN: 12 mL/min — AB (ref >90)
GLUCOSE: 93 mg/dL (ref 70–99)
POTASSIUM: 2.8 mmol/L — AB (ref 3.5–5.1)
SODIUM: 136 mmol/L (ref 135–145)
TOTAL PROTEIN: 5.6 g/dL — AB (ref 6.0–8.3)
Total Bilirubin: 1 mg/dL (ref 0.3–1.2)

## 2014-03-18 LAB — CBC
HEMATOCRIT: 20.6 % — AB (ref 39.0–52.0)
HEMATOCRIT: 24.9 % — AB (ref 39.0–52.0)
HEMOGLOBIN: 8.2 g/dL — AB (ref 13.0–17.0)
Hemoglobin: 6.7 g/dL — CL (ref 13.0–17.0)
MCH: 28.8 pg (ref 26.0–34.0)
MCH: 28.9 pg (ref 26.0–34.0)
MCHC: 32.5 g/dL (ref 30.0–36.0)
MCHC: 32.9 g/dL (ref 30.0–36.0)
MCV: 88.4 fL (ref 78.0–100.0)
MCV: 87.7 fL (ref 78.0–100.0)
Platelets: 84 10*3/uL — ABNORMAL LOW (ref 150–400)
Platelets: 84 10*3/uL — ABNORMAL LOW (ref 150–400)
RBC: 2.33 MIL/uL — AB (ref 4.22–5.81)
RBC: 2.84 MIL/uL — AB (ref 4.22–5.81)
RDW: 18.6 % — ABNORMAL HIGH (ref 11.5–15.5)
RDW: 17.3 % — ABNORMAL HIGH (ref 11.5–15.5)
WBC: 4.3 10*3/uL (ref 4.0–10.5)
WBC: 5.1 10*3/uL (ref 4.0–10.5)

## 2014-03-18 MED ORDER — POTASSIUM CHLORIDE CRYS ER 20 MEQ PO TBCR
40.0000 meq | EXTENDED_RELEASE_TABLET | Freq: Once | ORAL | Status: AC
  Administered 2014-03-18: 40 meq via ORAL
  Filled 2014-03-18: qty 2

## 2014-03-18 MED ORDER — SODIUM CHLORIDE 0.45 % IV SOLN
INTRAVENOUS | Status: DC
  Administered 2014-03-18: 15:00:00 via INTRAVENOUS
  Filled 2014-03-18 (×4): qty 1000

## 2014-03-18 MED ORDER — SODIUM CHLORIDE 0.9 % IV SOLN
Freq: Once | INTRAVENOUS | Status: AC
  Administered 2014-03-18: 10:00:00 via INTRAVENOUS

## 2014-03-18 MED ORDER — OXYCODONE HCL 5 MG PO TABS: 5.0000 mg | ORAL_TABLET | Freq: Four times a day (QID) | ORAL | Status: DC | PRN

## 2014-03-18 MED ORDER — ACETAMINOPHEN 325 MG PO TABS
650.0000 mg | ORAL_TABLET | Freq: Four times a day (QID) | ORAL | Status: DC | PRN
  Administered 2014-03-18: 650 mg via ORAL
  Filled 2014-03-18: qty 2

## 2014-03-18 NOTE — Discharge Summary (Signed)
Physician Discharge Summary  Nathaniel Steele FAO:[id number] DOB: 01/17/1927 DOA: 03/17/2014  PCP: Delorse LekBURNETT,BRENT A, MD  Admit date: 03/17/2014 Discharge date: 03/18/2014  Time spent: 45 minutes  Recommendations for Outpatient Follow-up:  -Will be discharged home today. -Advised to follow up with PCP in 2 weeks.   Discharge Diagnoses:  Active Problems:   SLE (systemic lupus erythematosus)   S/P CABG x 2: (LIMA-LAD  SVG - RCA)   PAF (paroxysmal atrial fibrillation)   RLS (restless legs syndrome)   Symptomatic anemia   CKD (chronic kidney disease) stage 4, GFR 15-29 ml/min   Anemia   Severe anemia   Discharge Condition: Stable and improved  Filed Weights   03/17/14 2255  Weight: 77.928 kg (171 lb 12.8 oz)    History of present illness:  This is an 81 year old man who presents with one-week history of weakness. He went to see his primary care physician and blood work showed that he had a hemoglobin around 5. He was given iron infusion yesterday in the short stay department. Apparently this is the third round of our infusion has been given. He needed 4 units of blood transfusion approximately one year ago for similar symptoms. Patient denies hematuria, hematemesis. He does have blood motions but he is taking iron. He denies any abdominal pain, nausea or vomiting. He has no chest pain, dyspnea or palpitations. He does have a history of chronic kidney disease and access for dialysis but dialysis has not been started yet. Evaluation in the emergency room shows him to have a hemoglobin of 5.9. His creatinine is also elevated at 4.9. He is now being admitted for further management.  Hospital Course:   Severe Anemia -Anemia panel was unfortunately not drawn prior to transfusion. -Hb was 5.2 on admission and has risen to 8.2 s/p 4 units of PRBCs. -He has had no evidence for GI bleed. Has chronic black stools but has been on iron. -He is on ASA/coumadin for a fib and I see no reason to  stop these medications as I have no convincing evidence of an active GI bleed. -On a recent admission he had EGD/colonosocopy that were negative for a source of bleed.  CKD Stage V -Has not yet required HD. -Fistula is in place. -[place]. [place]ott [place] in [place].  PAF -Rate controlled. -on coumadin  Procedures:  None   Consultations:  Renal, Dr. Kristian CoveyBefekadu  Discharge Instructions  Discharge Instructions    Diet - low sodium heart healthy    Complete by:  As directed      Increase activity slowly    Complete by:  As directed             Medication List    TAKE these medications        amiodarone 200 MG tablet  Commonly known as:  PACERONE  Take 1 tablet (200 mg total) by mouth daily.     aspirin EC 81 MG tablet  Take 81 mg by mouth every morning.     calcitRIOL 0.25 MCG capsule  Commonly known as:  ROCALTROL  Take 0.25 mcg by mouth daily before breakfast.     calcium acetate 667 MG capsule  Commonly known as:  PHOSLO  Take 667 mg by mouth every morning.     cycloSPORINE 0.05 % ophthalmic emulsion  Commonly known as:  RESTASIS  Place 1 drop into both eyes 2 (two) times daily.     finasteride 5 MG tablet  Commonly known as:  PROSCAR  Take 5 mg by mouth daily.     fish oil-omega-3 fatty acids 1000 MG capsule  Take 1 g by mouth daily.     furosemide 40 MG tablet  Commonly known as:  LASIX  Take 2 tablets (80 mg total) by mouth 2 (two) times daily. Takes 4 tablets     metoprolol 50 MG tablet  Commonly known as:  LOPRESSOR  Take 50 mg by mouth daily before breakfast.     OCUVITE PRESERVISION PO  Take 1 tablet by mouth daily.     pantoprazole 40 MG tablet  Commonly known as:  PROTONIX  Take 1 tablet (40 mg total) by mouth 2 (two) times daily.     rOPINIRole 2 MG tablet  Commonly known as:  REQUIP  Take 1 tablet (2 mg total) by mouth 2 (two) times daily.     VITAMIN B 12 PO  Take 1 tablet by mouth daily.     warfarin 5 MG tablet  Commonly  known as:  COUMADIN  Take 1 tablet (5 mg total) by mouth daily.       Allergies  Allergen Reactions  . Statins Other (See Comments)    myalgias  . Aspirin Hives    Patient states that he can take the enteric coated but not the uncoated  . Feraheme [Ferumoxytol] Rash and Other (See Comments)    Abdominal pain  . Penicillins Hives       Follow-up Information    Follow up with BURNETT,BRENT A, MD. Schedule an appointment as soon as possible for a visit in 2 weeks.   Specialty:  Family Medicine   Contact information:   [address] [place] [address] [phone]        The results of significant diagnostics from this hospitalization (including imaging, microbiology, ancillary and laboratory) are listed below for reference.    Significant Diagnostic Studies: No results found.  Microbiology: No results found for this or any previous visit (from the past 240 hour(s)).   Labs: Basic Metabolic Panel:  Recent Labs Lab 03/17/14 1725 03/18/14 0632  NA 134* 136  K 3.1* 2.8*  CL 90* 90*  CO2 29 29  GLUCOSE 112* 93  BUN 204* 186*  CREATININE 4.90* 4.60*  CALCIUM 9.0 9.0   Liver Function Tests:  Recent Labs Lab 03/18/14 0632  AST 24  ALT 17  ALKPHOS 37*  BILITOT 1.0  PROT 5.6*  ALBUMIN 3.4*   No results for input(s): LIPASE, AMYLASE in the last 168 hours. No results for input(s): AMMONIA in the last 168 hours. CBC:  Recent Labs Lab 03/17/14 1725 03/18/14 0632 03/18/14 1632  WBC 4.0 4.3 5.1  NEUTROABS 2.9  --   --   HGB 5.9* 6.7* 8.2*  HCT 18.4* 20.6* 24.9*  MCV 90.2 88.4 87.7  PLT 91* 84* 84*   Cardiac Enzymes: No results for input(s): CKTOTAL, CKMB, CKMBINDEX, TROPONINI in the last 168 hours. BNP: BNP (last 3 results) No results for input(s): BNP in the last 8760 hours.  ProBNP (last 3 results)  Recent Labs  12/22/13 1850  PROBNP 16536.0*    CBG: No results for input(s): GLUCAP in the last 168  hours.     SignedChaya Jan  [facility] Pager: [phone] 03/18/2014, 6:14 PM

## 2014-03-18 NOTE — Consult Note (Signed)
Reason for Consult: Renal failure and hypokalemia Referring Physician: Dr. Haig Steele is an 81 y.o. male.  HPI: He is a patient who has history of lupus, chronic renal failure stage 4/5 and history of hypertension presently came with complaints of weakness, lack of energy and exertional dyspnea. When he was evaluated as an outpatient and was found to have hemoglobin of 5 and sent to the hospital for further management. Patient had recurrent anemia and has received blood transfusion about a year ago. Presently patient denies any nausea, vomiting. His appetite is good and denies any difficulty in breathing or orthopnea.  Past Medical History  Diagnosis Date  . Shortness of breath with exertion  . Chronic kidney disease     not on dialysis yet  . Coronary artery disease 05/14/2010    stress test - no scintigraphic evidence of inducible myocardial ischemia,; normal study  . Hypertension   . Arthritis   . Blood dyscrasia     one time had low platlet count  . Dysrhythmia     atrial fib  . SSS (sick sinus syndrome) 08/27/2011    echo EF >55%, severe LAE, moderate RAE, tissue AVR w/ gradients 35 and 35mHg  . Pericardial effusion 03/12/2011    echo EF 55-60%  . Claudication 02/19/2012    LE doppler no evidence of arterial insufficiency, lower extremities demonstrate normal values  w/ no evidence of insufficiency  . Lupus nephritis   . Pacemaker 09/19/2004    implanted  . Restless leg syndrome   . Cellulitis   . Anemia   . Benign prostatic hypertrophy   . Constipation   . Shoulder joint pain   . Acute on chronic diastolic CHF (congestive heart failure), NYHA class 3 12/07/2013    Past Surgical History  Procedure Laterality Date  . Back surgery    . Appendectomy    . Hernia repair    . Pacemaker insertion  2006    [employer] EnRhythm  . Av fistula placement  02/24/2011  . Aortic valve replacement  02/24/2011    [facility]  . Coronary artery  bypass graft  02/24/2011    LIMA to LAD, SVG to distal RCA  . Cardiac catheterization  02/13/2011    mod/severe aortic valve stenosis w peak to peak gradient 25-32 mmHg,  70% stenosis LAD, 30-40%proximal followed by 90% sstenosis in RCA prior to anterior RV margin branch  . Lumbar laminectomy/decompression microdiscectomy Right 04/16/2012    Procedure: DECOMPRESSIVE L4 - L5/ MICRODISCECTOMY ON THE RIGHT 1 LEVEL;  Surgeon: RTobi Bastos MD;  Location: [facility];  Service: Orthopedics;  Laterality: Right;  . Esophagogastroduodenoscopy N/A 06/21/2013    Procedure: ESOPHAGOGASTRODUODENOSCOPY (EGD);  Surgeon: PBeryle Beams MD;  [facility];  Service: Endoscopy;  Laterality: N/A;  . Givens capsule study N/A 06/21/2013    Procedure: GIVENS CAPSULE STUDY;  Surgeon: PBeryle Beams MD;  [place] [place]  Service: Endoscopy;  Laterality: N/A;  . Cardioversion N/A 12/09/2013    Procedure: CARDIOVERSION;  Surgeon: MSanda Klein MD;  Location: [facility]OSCOPY;  Service: Cardiovascular;  Laterality: N/A;  . Left and right heart catheterization with coronary angiogram N/A 02/13/2011    Procedure: LEFT AND RIGHT HEART CATHETERIZATION WITH CORONARY ANGIOGRAM;  Surgeon: TTroy Sine MD;  [facility];  Service: Cardiovascular;  Laterality: N/A;  . Permanent pacemaker generator change N/A 05/10/2013    Procedure: PERMANENT PACEMAKER GENERATOR CHANGE;  Surgeon: MSanda Klein MD;  Location: [place] [facility];  Service: Cardiovascular;  Laterality: N/A;    Family History  Problem Relation Age of Onset  . Hypertension Mother   . Kidney disease Brother     Social History:  reports that he quit smoking about 57 years ago. His smoking use included Cigarettes. He has never used smokeless tobacco. He reports that he does not drink alcohol or use illicit drugs.  Allergies:  Allergies  Allergen Reactions  . Statins Other (See Comments)    myalgias  . Aspirin Hives    Patient states that he can  take the enteric coated but not the uncoated  . Feraheme [Ferumoxytol] Rash and Other (See Comments)    Abdominal pain  . Penicillins Hives    Medications: I have reviewed the patient's current medications.  Results for orders placed or performed during the hospital encounter of 03/17/14 (from the past 48 hour(s))  CBC with Differential     Status: Abnormal   Collection Time: 03/17/14  5:25 PM  Result Value Ref Range   WBC 4.0 4.0 - 10.5 K/uL   RBC 2.04 (L) 4.22 - 5.81 MIL/uL   Hemoglobin 5.9 (LL) 13.0 - 17.0 g/dL    Comment: RESULT REPEATED AND VERIFIED CRITICAL RESULT CALLED TO, READ BACK BY AND VERIFIED WITH:  CARDWELL,L @ 1750 ON 03/17/14 BY WOODIE,J    HCT 18.4 (L) 39.0 - 52.0 %   MCV 90.2 78.0 - 100.0 fL   MCH 28.9 26.0 - 34.0 pg   MCHC 32.1 30.0 - 36.0 g/dL   RDW 18.0 (H) 11.5 - 15.5 %   Platelets 91 (L) 150 - 400 K/uL    Comment: SPECIMEN CHECKED FOR CLOTS   Neutrophils Relative % 73 43 - 77 %   Neutro Abs 2.9 1.7 - 7.7 K/uL   Lymphocytes Relative 17 12 - 46 %   Lymphs Abs 0.7 0.7 - 4.0 K/uL   Monocytes Relative 7 3 - 12 %   Monocytes Absolute 0.3 0.1 - 1.0 K/uL   Eosinophils Relative 2 0 - 5 %   Eosinophils Absolute 0.1 0.0 - 0.7 K/uL   Basophils Relative 0 0 - 1 %   Basophils Absolute 0.0 0.0 - 0.1 K/uL  Basic metabolic panel     Status: Abnormal   Collection Time: 03/17/14  5:25 PM  Result Value Ref Range   Sodium 134 (L) 135 - 145 mmol/L   Potassium 3.1 (L) 3.5 - 5.1 mmol/L   Chloride 90 (L) 96 - 112 mmol/L   CO2 29 19 - 32 mmol/L   Glucose, Bld 112 (H) 70 - 99 mg/dL   BUN 204 (H) 6 - 23 mg/dL    Comment: RESULTS CONFIRMED BY MANUAL DILUTION   Creatinine, Ser 4.90 (H) 0.50 - 1.35 mg/dL   Calcium 9.0 8.4 - 10.5 mg/dL   GFR calc non Af Amer 10 (L) >90 mL/min   GFR calc Af Amer 11 (L) >90 mL/min    Comment: (NOTE) The eGFR has been calculated using the CKD EPI equation. This calculation has not been validated in all clinical situations. eGFR's persistently  <90 mL/min signify possible Chronic Kidney Disease.    Anion gap 15 5 - 15  Protime-INR     Status: Abnormal   Collection Time: 03/17/14  5:25 PM  Result Value Ref Range   Prothrombin Time 39.4 (H) 11.6 - 15.2 seconds   INR 4.02 (H) 0.00 - 1.49  Type and screen     Status: None (Preliminary  result)   Collection Time: 03/17/14  5:25 PM  Result Value Ref Range   ABO/RH(D) A POS    Antibody Screen NEG    Sample Expiration 03/20/2014    Unit Number [id number]    Blood Component Type RED CELLS,LR    Unit division 00    Status of Unit ISSUED    Transfusion Status OK TO TRANSFUSE    Crossmatch Result Compatible    Unit Number [id number]    Blood Component Type RED CELLS,LR    Unit division 00    Status of Unit ALLOCATED    Transfusion Status OK TO TRANSFUSE    Crossmatch Result Compatible    Unit Number [id number]    Blood Component Type RED CELLS,LR    Unit division 00    Status of Unit ISSUED,FINAL    Transfusion Status OK TO TRANSFUSE    Crossmatch Result Compatible   ABO/Rh     Status: None   Collection Time: 03/17/14  5:25 PM  Result Value Ref Range   ABO/RH(D) A POS   Prepare RBC     Status: None   Collection Time: 03/17/14  5:25 PM  Result Value Ref Range   Order Confirmation ORDER PROCESSED BY BLOOD BANK   Reticulocytes     Status: Abnormal   Collection Time: 03/17/14  5:25 PM  Result Value Ref Range   Retic Ct Pct 3.9 (H) 0.4 - 3.1 %   RBC. 2.01 (L) 4.22 - 5.81 MIL/uL   Retic Count, Manual 78.4 19.0 - 186.0 K/uL  Urinalysis, Routine w reflex microscopic     Status: Abnormal   Collection Time: 03/17/14  5:53 PM  Result Value Ref Range   Color, Urine STRAW (A) YELLOW   APPearance HAZY (A) CLEAR   Specific Gravity, Urine 1.010 1.005 - 1.030   pH 5.5 5.0 - 8.0   Glucose, UA NEGATIVE NEGATIVE mg/dL   Hgb urine dipstick TRACE (A) NEGATIVE   Bilirubin Urine NEGATIVE NEGATIVE   Ketones, ur NEGATIVE NEGATIVE mg/dL   Protein, ur NEGATIVE NEGATIVE mg/dL    Urobilinogen, UA 0.2 0.0 - 1.0 mg/dL   Nitrite NEGATIVE NEGATIVE   Leukocytes, UA LARGE (A) NEGATIVE  Urine microscopic-add on     Status: Abnormal   Collection Time: 03/17/14  5:53 PM  Result Value Ref Range   Squamous Epithelial / LPF RARE RARE   WBC, UA 21-50 <3 WBC/hpf   RBC / HPF 0-2 <3 RBC/hpf   Bacteria, UA MANY (A) RARE  Comprehensive metabolic panel     Status: Abnormal   Collection Time: 03/18/14  6:32 AM  Result Value Ref Range   Sodium 136 135 - 145 mmol/L   Potassium 2.8 (L) 3.5 - 5.1 mmol/L   Chloride 90 (L) 96 - 112 mmol/L   CO2 29 19 - 32 mmol/L   Glucose, Bld 93 70 - 99 mg/dL   BUN 186 (H) 6 - 23 mg/dL    Comment: RESULTS CONFIRMED BY MANUAL DILUTION   Creatinine, Ser 4.60 (H) 0.50 - 1.35 mg/dL   Calcium 9.0 8.4 - 10.5 mg/dL   Total Protein 5.6 (L) 6.0 - 8.3 g/dL   Albumin 3.4 (L) 3.5 - 5.2 g/dL   AST 24 0 - 37 U/L   ALT 17 0 - 53 U/L   Alkaline Phosphatase 37 (L) 39 - 117 U/L   Total Bilirubin 1.0 0.3 - 1.2 mg/dL   GFR calc non Af Amer 10 (L) >90 mL/min   GFR calc  Af Amer 12 (L) >90 mL/min    Comment: (NOTE) The eGFR has been calculated using the CKD EPI equation. This calculation has not been validated in all clinical situations. eGFR's persistently <90 mL/min signify possible Chronic Kidney Disease.    Anion gap 17 (H) 5 - 15  CBC     Status: Abnormal   Collection Time: 03/18/14  6:32 AM  Result Value Ref Range   WBC 4.3 4.0 - 10.5 K/uL   RBC 2.33 (L) 4.22 - 5.81 MIL/uL   Hemoglobin 6.7 (LL) 13.0 - 17.0 g/dL    Comment: CRITICAL RESULT CALLED TO, READ BACK BY AND VERIFIED WITH: R.CHAPPELLE AT 5537 ON 03/18/14 BY S.VANHOORNE    HCT 20.6 (L) 39.0 - 52.0 %   MCV 88.4 78.0 - 100.0 fL   MCH 28.8 26.0 - 34.0 pg   MCHC 32.5 30.0 - 36.0 g/dL   RDW 18.6 (H) 11.5 - 15.5 %   Platelets 84 (L) 150 - 400 K/uL    Comment: CONSISTENT WITH PREVIOUS RESULT  Protime-INR     Status: Abnormal   Collection Time: 03/18/14  6:32 AM  Result Value Ref Range    Prothrombin Time 41.9 (H) 11.6 - 15.2 seconds   INR 4.34 (H) 0.00 - 1.49    No results found.  Review of Systems  Constitutional: Positive for malaise/fatigue. Negative for fever and chills.  Respiratory: Negative for shortness of breath.   Cardiovascular: Negative for orthopnea.       Exertional dyspnea  Gastrointestinal: Negative for nausea and vomiting.  Neurological: Positive for weakness.   Blood pressure 90/48, pulse 70, temperature 97.6 F (36.4 C), temperature source Oral, resp. rate 16, height _0  (1.702 m), weight 77.928 kg (171 lb 12.8 oz), SpO2 93 %. Physical Exam  Constitutional: No distress.  Eyes: No scleral icterus.  Neck: No JVD present.  Cardiovascular: Normal rate and regular rhythm.   Murmur heard. Respiratory: No respiratory distress. He has no wheezes.  GI: There is no tenderness. There is no rebound.  Musculoskeletal: He exhibits edema.    Assessment/Plan: Problem #1 acute kidney injury superimposed on chronic. Presently his BUN is significantly high and high BUN to creatinine ratio. This could be secondary to upper GI bleeding versus prerenal syndrome. Presently patient is asymptomatic. Problem #2 chronic renal failure thought to be secondary to lupus nephritis. Patient with underlying late stage IV or early stage V. He has left hand fistula for the last 2 years. Problem #3 anemia: Possible GI bleeding. Patient states that he has workup from before with negative result. He has received also blood transfusion about a year ago. Presently he denies any frank bleeding. Problem #4 hypertension: His blood pressure seems to be somewhat low Problem #5 metabolic bone disease: His calcium is in range. Patient with secondary hyperparathyroidism and he has been on calcitriol and also phosphorus binder. Problem # 6 paroxysmal atrial fibrillation patient on Coumadin Problem #7 hypokalemia Problem #8 history of Aortic valve and pacemaker placement. Plan: We'll start  patient on half normal saline with 40 mEq of KCl at 55 mL per hour We'll check his basic metabolic panel in the morning. We'll check also his phosphorus. If patient becomes symptomatic we'll consider initiating dialysis.  Nathaniel Steele S 03/18/2014, 8:41 AM

## 2014-03-18 NOTE — Progress Notes (Signed)
Utilization Review completed.  

## 2014-03-18 NOTE — Progress Notes (Signed)
Blood transfusion completed. Notified MD earlier regarding leg pain medicated with tylenol po.

## 2014-03-18 NOTE — Progress Notes (Signed)
ANTICOAGULATION CONSULT NOTE - Initial Consult  Pharmacy Consult for Coumadin (chronic Rx PTA) Indication: atrial fibrillation  Allergies  Allergen Reactions  . Statins Other (See Comments)    myalgias  . Aspirin Hives    Patient states that he can take the enteric coated but not the uncoated  . Feraheme [Ferumoxytol] Rash and Other (See Comments)    Abdominal pain  . Penicillins Hives    Patient Measurements: Height:  (170.2 cm) Weight: 171 lb 12.8 oz (77.928 kg) IBW/kg (Calculated) : 66.1  Vital Signs: Temp: 97.6 F (36.4 C) (02/27 0703) Temp Source: Oral (02/27 0703) BP: 90/48 mmHg (02/27 0703) Pulse Rate: 70 (02/27 0703)  Labs:  Recent Labs  03/17/14 1725 03/18/14 0632  HGB 5.9* 6.7*  HCT 18.4* 20.6*  PLT 91* 84*  LABPROT 39.4* 41.9*  INR 4.02* 4.34*  CREATININE 4.90* 4.60*    Estimated Creatinine Clearance: 11 mL/min (by C-G formula based on Cr of 4.6).  Medical History: Past Medical History  Diagnosis Date  . Shortness of breath with exertion  . Chronic kidney disease     not on dialysis yet  . Coronary artery disease 05/14/2010    stress test - no scintigraphic evidence of inducible myocardial ischemia,; normal study  . Hypertension   . Arthritis   . Blood dyscrasia     one time had low platlet count  . Dysrhythmia     atrial fib  . SSS (sick sinus syndrome) 08/27/2011    echo EF >55%, severe LAE, moderate RAE, tissue AVR w/ gradients 35 and  . Pericardial effusion 03/12/2011    echo EF 55-60%  . Claudication 02/19/2012    LE doppler no evidence of arterial insufficiency, lower extremities demonstrate normal values  w/ no evidence of insufficiency  . Lupus nephritis   . Pacemaker 09/19/2004    implanted  . Restless leg syndrome   . Cellulitis   . Anemia   . Benign prostatic hypertrophy   . Constipation   . Shoulder joint pain   . Acute on chronic diastolic CHF (congestive heart failure), NYHA class 3 12/07/2013    Medications:   Prescriptions prior to admission  Medication Sig Dispense Refill Last Dose  . amiodarone (PACERONE) 200 MG tablet Take 1 tablet (200 mg total) by mouth daily. 60 tablet 11 03/17/2014 at Unknown time  . aspirin EC 81 MG tablet Take 81 mg by mouth every morning.    03/17/2014 at Unknown time  . calcitRIOL (ROCALTROL) 0.25 MCG capsule Take 0.25 mcg by mouth daily before breakfast.    03/17/2014 at Unknown time  . calcium acetate (PHOSLO) 667 MG capsule Take 667 mg by mouth every morning.    03/17/2014 at Unknown time  . Cyanocobalamin (VITAMIN B 12 PO) Take 1 tablet by mouth daily.   03/17/2014 at Unknown time  . cycloSPORINE (RESTASIS) 0.05 % ophthalmic emulsion Place 1 drop into both eyes 2 (two) times daily.    03/17/2014 at morning  . finasteride (PROSCAR) 5 MG tablet Take 5 mg by mouth daily.   03/17/2014 at Unknown time  . fish oil-omega-3 fatty acids 1000 MG capsule Take 1 g by mouth daily.    03/17/2014 at Unknown time  . furosemide (LASIX) 40 MG tablet Take 2 tablets (80 mg total) by mouth 2 (two) times daily. Takes 4 tablets (Patient taking differently: Take 160 mg by mouth 2 (two) times daily. ) 60 tablet 0 03/17/2014 at morning  . metoprolol (LOPRESSOR) 50 MG  tablet Take 50 mg by mouth daily before breakfast.    03/17/2014 at 730a  . Multiple Vitamins-Minerals (OCUVITE PRESERVISION PO) Take 1 tablet by mouth daily.   03/17/2014 at Unknown time  . pantoprazole (PROTONIX) 40 MG tablet Take 1 tablet (40 mg total) by mouth 2 (two) times daily. 60 tablet 0 03/17/2014 at Unknown time  . rOPINIRole (REQUIP) 2 MG tablet Take 1 tablet (2 mg total) by mouth 2 (two) times daily. 30 tablet 0 03/17/2014 at morning  . warfarin (COUMADIN) 5 MG tablet Take 1 tablet (5 mg total) by mouth daily. (Patient taking differently: Take 2.5-5 mg by mouth daily at 6 PM. Patient takes 1 tablet daily except 1/2 tablet(2.5mg ) on Monday Wednesday, and Friday) 90 tablet 1 03/16/2014 at Unknown time    Assessment: 81yo male on  chronic Coumadin PTA for h/o afib.  INR supratherapeutic.  Home dose listed above.  Goal of Therapy:  INR 2-3 Monitor platelets by anticoagulation protocol: Yes   Plan:  No Coumadin today (INR elevated) INR daily until stable.  Margo AyeHall, Monique Hefty A 03/18/2014,8:23 AM

## 2014-03-18 NOTE — Progress Notes (Signed)
Discharge instruction reviewed with patient. No distress noted. IV removed. 

## 2014-03-18 NOTE — Progress Notes (Signed)
Utilization review Completed Deborh Pense RN BSN   

## 2014-03-19 LAB — FOLATE: FOLATE: 10.2 ng/mL

## 2014-03-19 LAB — IRON AND TIBC
Iron: 168 ug/dL — ABNORMAL HIGH (ref 42–165)
SATURATION RATIOS: 62 % — AB (ref 20–55)
TIBC: 270 ug/dL (ref 215–435)
UIBC: 102 ug/dL — ABNORMAL LOW (ref 125–400)

## 2014-03-19 LAB — TYPE AND SCREEN
ABO/RH(D): A POS
ANTIBODY SCREEN: NEGATIVE
UNIT DIVISION: 0
Unit division: 0
Unit division: 0
Unit division: 0

## 2014-03-19 LAB — FERRITIN: FERRITIN: 345 ng/mL — AB (ref 22–322)

## 2014-03-19 LAB — VITAMIN B12: Vitamin B-12: 1239 pg/mL — ABNORMAL HIGH (ref 211–911)

## 2014-03-21 ENCOUNTER — Encounter: Payer: Self-pay | Admitting: Cardiovascular Disease

## 2014-03-24 ENCOUNTER — Encounter (HOSPITAL_COMMUNITY)
Admission: EM | Disposition: A | Discharge status: Home / Self Care | Payer: Self-pay | Source: Home / Self Care | Attending: Internal Medicine

## 2014-03-24 ENCOUNTER — Emergency Department (HOSPITAL_COMMUNITY): Payer: Medicare Other

## 2014-03-24 ENCOUNTER — Encounter (HOSPITAL_COMMUNITY): Payer: Self-pay | Admitting: Emergency Medicine

## 2014-03-24 ENCOUNTER — Observation Stay (HOSPITAL_COMMUNITY)
Admission: EM | Admit: 2014-03-24 | Discharge: 2014-03-26 | Disposition: A | Discharge status: Home / Self Care | Payer: Medicare Other | Attending: Internal Medicine | Admitting: Internal Medicine

## 2014-03-24 DIAGNOSIS — I495 Sick sinus syndrome: Secondary | ICD-10-CM | POA: Insufficient documentation

## 2014-03-24 DIAGNOSIS — R748 Abnormal levels of other serum enzymes: Secondary | ICD-10-CM | POA: Insufficient documentation

## 2014-03-24 DIAGNOSIS — N400 Enlarged prostate without lower urinary tract symptoms: Secondary | ICD-10-CM | POA: Insufficient documentation

## 2014-03-24 DIAGNOSIS — R079 Chest pain, unspecified: Principal | ICD-10-CM | POA: Diagnosis present

## 2014-03-24 DIAGNOSIS — I2510 Atherosclerotic heart disease of native coronary artery without angina pectoris: Secondary | ICD-10-CM | POA: Insufficient documentation

## 2014-03-24 DIAGNOSIS — D649 Anemia, unspecified: Secondary | ICD-10-CM | POA: Diagnosis not present

## 2014-03-24 DIAGNOSIS — I5032 Chronic diastolic (congestive) heart failure: Secondary | ICD-10-CM | POA: Insufficient documentation

## 2014-03-24 DIAGNOSIS — N189 Chronic kidney disease, unspecified: Secondary | ICD-10-CM

## 2014-03-24 DIAGNOSIS — E876 Hypokalemia: Secondary | ICD-10-CM | POA: Diagnosis not present

## 2014-03-24 DIAGNOSIS — Z951 Presence of aortocoronary bypass graft: Secondary | ICD-10-CM

## 2014-03-24 DIAGNOSIS — Z7982 Long term (current) use of aspirin: Secondary | ICD-10-CM | POA: Insufficient documentation

## 2014-03-24 DIAGNOSIS — M3214 Glomerular disease in systemic lupus erythematosus: Secondary | ICD-10-CM | POA: Diagnosis not present

## 2014-03-24 DIAGNOSIS — Z952 Presence of prosthetic heart valve: Secondary | ICD-10-CM | POA: Diagnosis not present

## 2014-03-24 DIAGNOSIS — Z950 Presence of cardiac pacemaker: Secondary | ICD-10-CM | POA: Diagnosis present

## 2014-03-24 DIAGNOSIS — D631 Anemia in chronic kidney disease: Secondary | ICD-10-CM

## 2014-03-24 DIAGNOSIS — Z7901 Long term (current) use of anticoagulants: Secondary | ICD-10-CM | POA: Diagnosis not present

## 2014-03-24 DIAGNOSIS — N179 Acute kidney failure, unspecified: Secondary | ICD-10-CM | POA: Diagnosis not present

## 2014-03-24 DIAGNOSIS — N19 Unspecified kidney failure: Secondary | ICD-10-CM

## 2014-03-24 DIAGNOSIS — Z87891 Personal history of nicotine dependence: Secondary | ICD-10-CM | POA: Insufficient documentation

## 2014-03-24 DIAGNOSIS — I480 Paroxysmal atrial fibrillation: Secondary | ICD-10-CM | POA: Diagnosis not present

## 2014-03-24 DIAGNOSIS — Z539 Procedure and treatment not carried out, unspecified reason: Secondary | ICD-10-CM | POA: Diagnosis not present

## 2014-03-24 DIAGNOSIS — K922 Gastrointestinal hemorrhage, unspecified: Secondary | ICD-10-CM | POA: Diagnosis not present

## 2014-03-24 DIAGNOSIS — G2581 Restless legs syndrome: Secondary | ICD-10-CM | POA: Insufficient documentation

## 2014-03-24 DIAGNOSIS — D696 Thrombocytopenia, unspecified: Secondary | ICD-10-CM | POA: Diagnosis not present

## 2014-03-24 DIAGNOSIS — I4821 Permanent atrial fibrillation: Secondary | ICD-10-CM | POA: Diagnosis present

## 2014-03-24 DIAGNOSIS — N185 Chronic kidney disease, stage 5: Secondary | ICD-10-CM | POA: Insufficient documentation

## 2014-03-24 DIAGNOSIS — I209 Angina pectoris, unspecified: Secondary | ICD-10-CM

## 2014-03-24 LAB — TROPONIN I
TROPONIN I: 0.11 ng/mL — AB (ref <0.031)
TROPONIN I: 0.1 ng/mL — AB (ref <0.031)

## 2014-03-24 LAB — CBC WITH DIFFERENTIAL/PLATELET
Basophils Absolute: 0 10*3/uL (ref 0.0–0.1)
Basophils Relative: 0 % (ref 0–1)
Eosinophils Absolute: 0.1 10*3/uL (ref 0.0–0.7)
Eosinophils Relative: 2 % (ref 0–5)
HEMATOCRIT: 22 % — AB (ref 39.0–52.0)
Hemoglobin: 7.2 g/dL — ABNORMAL LOW (ref 13.0–17.0)
LYMPHS ABS: 0.6 10*3/uL — AB (ref 0.7–4.0)
LYMPHS PCT: 13 % (ref 12–46)
MCH: 29.4 pg (ref 26.0–34.0)
MCHC: 32.7 g/dL (ref 30.0–36.0)
MCV: 89.8 fL (ref 78.0–100.0)
MONO ABS: 0.3 10*3/uL (ref 0.1–1.0)
Monocytes Relative: 7 % (ref 3–12)
Neutro Abs: 3.3 10*3/uL (ref 1.7–7.7)
Neutrophils Relative %: 78 % — ABNORMAL HIGH (ref 43–77)
Platelets: 106 10*3/uL — ABNORMAL LOW (ref 150–400)
RBC: 2.45 MIL/uL — AB (ref 4.22–5.81)
RDW: 18.2 % — ABNORMAL HIGH (ref 11.5–15.5)
WBC: 4.2 10*3/uL (ref 4.0–10.5)

## 2014-03-24 LAB — I-STAT TROPONIN, ED: Troponin i, poc: 0.08 ng/mL (ref 0.00–0.08)

## 2014-03-24 LAB — BASIC METABOLIC PANEL
ANION GAP: 18 — AB (ref 5–15)
BUN: 228 mg/dL — ABNORMAL HIGH (ref 6–23)
CHLORIDE: 88 mmol/L — AB (ref 96–112)
CO2: 31 mmol/L (ref 19–32)
Calcium: 9.3 mg/dL (ref 8.4–10.5)
Creatinine, Ser: 4.55 mg/dL — ABNORMAL HIGH (ref 0.50–1.35)
GFR calc Af Amer: 12 mL/min — ABNORMAL LOW (ref >90)
GFR, EST NON AFRICAN AMERICAN: 11 mL/min — AB (ref >90)
Glucose, Bld: 103 mg/dL — ABNORMAL HIGH (ref 70–99)
Potassium: 2.5 mmol/L — CL (ref 3.5–5.1)
SODIUM: 137 mmol/L (ref 135–145)

## 2014-03-24 LAB — PROTIME-INR
INR: 1.88 — ABNORMAL HIGH (ref 0.00–1.49)
PROTHROMBIN TIME: 21.8 s — AB (ref 11.6–15.2)

## 2014-03-24 LAB — APTT: APTT: 42 s — AB (ref 24–37)

## 2014-03-24 LAB — PREPARE RBC (CROSSMATCH)

## 2014-03-24 LAB — POC OCCULT BLOOD, ED: FECAL OCCULT BLD: POSITIVE — AB

## 2014-03-24 SURGERY — CANCELLED PROCEDURE

## 2014-03-24 SURGERY — EGD (ESOPHAGOGASTRODUODENOSCOPY)
Anesthesia: Moderate Sedation

## 2014-03-24 MED ORDER — MORPHINE SULFATE 2 MG/ML IJ SOLN
2.0000 mg | INTRAMUSCULAR | Status: DC | PRN
  Administered 2014-03-24 (×2): 2 mg via INTRAVENOUS
  Filled 2014-03-24 (×2): qty 1

## 2014-03-24 MED ORDER — ACETAMINOPHEN 325 MG PO TABS: 650.0000 mg | ORAL_TABLET | ORAL | Status: DC | PRN

## 2014-03-24 MED ORDER — AMIODARONE HCL 200 MG PO TABS
200.0000 mg | ORAL_TABLET | Freq: Every day | ORAL | Status: DC
  Administered 2014-03-24 – 2014-03-26 (×3): 200 mg via ORAL
  Filled 2014-03-24 (×3): qty 1

## 2014-03-24 MED ORDER — SODIUM CHLORIDE 0.9 % IV SOLN: INTRAVENOUS | Status: DC

## 2014-03-24 MED ORDER — PANTOPRAZOLE SODIUM 40 MG IV SOLR
40.0000 mg | Freq: Two times a day (BID) | INTRAVENOUS | Status: DC
  Administered 2014-03-24 – 2014-03-25 (×3): 40 mg via INTRAVENOUS
  Filled 2014-03-24 (×5): qty 40

## 2014-03-24 MED ORDER — POTASSIUM CHLORIDE CRYS ER 20 MEQ PO TBCR
40.0000 meq | EXTENDED_RELEASE_TABLET | ORAL | Status: AC
  Administered 2014-03-24 (×2): 40 meq via ORAL
  Filled 2014-03-24 (×2): qty 2

## 2014-03-24 MED ORDER — ROPINIROLE HCL 1 MG PO TABS
2.0000 mg | ORAL_TABLET | Freq: Two times a day (BID) | ORAL | Status: DC
  Administered 2014-03-24 – 2014-03-26 (×4): 2 mg via ORAL
  Filled 2014-03-24 (×5): qty 2

## 2014-03-24 MED ORDER — ASPIRIN 81 MG PO CHEW
324.0000 mg | CHEWABLE_TABLET | Freq: Once | ORAL | Status: AC
  Administered 2014-03-24: 324 mg via ORAL
  Filled 2014-03-24: qty 4

## 2014-03-24 MED ORDER — POTASSIUM CHLORIDE 10 MEQ/100ML IV SOLN: 10.0000 meq | INTRAVENOUS | Status: DC

## 2014-03-24 MED ORDER — SODIUM CHLORIDE 0.9 % IV SOLN
INTRAVENOUS | Status: DC
  Administered 2014-03-24: 16:00:00 via INTRAVENOUS

## 2014-03-24 MED ORDER — POTASSIUM CHLORIDE CRYS ER 20 MEQ PO TBCR
40.0000 meq | EXTENDED_RELEASE_TABLET | Freq: Once | ORAL | Status: AC
  Administered 2014-03-24: 40 meq via ORAL
  Filled 2014-03-24: qty 2

## 2014-03-24 MED ORDER — FUROSEMIDE 10 MG/ML IJ SOLN
40.0000 mg | Freq: Once | INTRAMUSCULAR | Status: AC
  Administered 2014-03-24: 40 mg via INTRAVENOUS
  Filled 2014-03-24: qty 4

## 2014-03-24 MED ORDER — SODIUM CHLORIDE 0.9 % IV SOLN: 10.0000 mL/h | Freq: Once | INTRAVENOUS | Status: DC

## 2014-03-24 MED ORDER — ONDANSETRON HCL 4 MG/2ML IJ SOLN
4.0000 mg | Freq: Four times a day (QID) | INTRAMUSCULAR | Status: DC | PRN
Start: 2014-03-24 — End: 2014-03-26

## 2014-03-24 NOTE — ED Notes (Signed)
Pt here for a few days of chest pain on the right side between shoulder blades. 18 RAC. Given nitro in route. Bp 110/56. HR 80s paced. Pain 0 after nitro. sts pain is intermittent. BP dropped slightly after nitro,. Pt has AV fistula but not on dialysis.

## 2014-03-24 NOTE — ED Provider Notes (Addendum)
TIME SEEN: 8:30 AM  CHIEF COMPLAINT: Chest pain  HPI: Pt is a 81 y.o. with history of coronary artery disease, chronic kidney disease, hypertension, sick sinus syndrome status post pacemaker, CHF, recent symptomatic anemia requiring iron and blood transfusion who presents to the emergency department with complains of left-sided chest pain that started around 6 AM today when he was lying in bed. Describes it as a tightness that radiates into his left arm. He did have nausea, dizziness but no shortness of breath or diaphoresis. States he was feeling better after his discharge for recent symptomatic anemia but has now been having lightheadedness with standing, dyspnea with exertion, generalized weakness again. Reports he has had a two-vessel CABG and aortic valve replacement secondary to aortic stenosis in February 2013. He is chronically on Coumadin. Denies hematochezia or melena. Does have dark stool but states he chronically takes iron. No vomiting. No fever. No cough. States he is now chest pain-free. Received 81 mg of aspirin with his wife at home. Was given nitroglycerin by EMS. He is not sure if this is what relieved his pain. History is limited as patient is a poor historian.  PCP is Dr. Rosezetta SchlatterBurnette with [facility] in [place] Cardiologist is Dr. Royann Shiversroitoru  ROS: See HPI Constitutional: no fever  Eyes: no drainage  ENT: no runny nose   Cardiovascular:  no chest pain  Resp: no SOB  GI: no vomiting GU: no dysuria Integumentary: no rash  Allergy: no hives  Musculoskeletal: no leg swelling  Neurological: no slurred speech ROS otherwise negative  PAST MEDICAL HISTORY/PAST SURGICAL HISTORY:  Past Medical History  Diagnosis Date  . Shortness of breath with exertion  . Chronic kidney disease     not on dialysis yet  . Coronary artery disease 05/14/2010    stress test - no scintigraphic evidence of inducible myocardial ischemia,; normal study  . Hypertension   . Arthritis   . Blood  dyscrasia     one time had low platlet count  . Dysrhythmia     atrial fib  . SSS (sick sinus syndrome) 08/27/2011    echo EF >55%, severe LAE, moderate RAE, tissue AVR w/ gradients 35 and 17mmHg  . Pericardial effusion 03/12/2011    echo EF 55-60%  . Claudication 02/19/2012    LE doppler no evidence of arterial insufficiency, lower extremities demonstrate normal values  w/ no evidence of insufficiency  . Lupus nephritis   . Pacemaker 09/19/2004    implanted  . Restless leg syndrome   . Cellulitis   . Anemia   . Benign prostatic hypertrophy   . Constipation   . Shoulder joint pain   . Acute on chronic diastolic CHF (congestive heart failure), NYHA class 3 12/07/2013    MEDICATIONS:  Prior to Admission medications   Medication Sig Start Date End Date Taking? Authorizing Provider  amiodarone (PACERONE) 200 MG tablet Take 1 tablet (200 mg total) by mouth daily. 02/16/14   Mihai Croitoru, MD  aspirin EC 81 MG tablet Take 81 mg by mouth every morning.     Historical Provider, MD  calcitRIOL (ROCALTROL) 0.25 MCG capsule Take 0.25 mcg by mouth daily before breakfast.     Historical Provider, MD  calcium acetate (PHOSLO) 667 MG capsule Take 667 mg by mouth every morning.     Historical Provider, MD  Cyanocobalamin (VITAMIN B 12 PO) Take 1 tablet by mouth daily.    Historical Provider, MD  cycloSPORINE (RESTASIS) 0.05 % ophthalmic emulsion Place 1 drop  into both eyes 2 (two) times daily.     Historical Provider, MD  finasteride (PROSCAR) 5 MG tablet Take 5 mg by mouth daily.    Historical Provider, MD  fish oil-omega-3 fatty acids 1000 MG capsule Take 1 g by mouth daily.     Historical Provider, MD  furosemide (LASIX) 40 MG tablet Take 2 tablets (80 mg total) by mouth 2 (two) times daily. Takes 4 tablets Patient taking differently: Take 160 mg by mouth 2 (two) times daily.  12/25/13   Leroy Sea, MD  metoprolol (LOPRESSOR) 50 MG tablet Take 50 mg by mouth daily before breakfast.      Historical Provider, MD  Multiple Vitamins-Minerals (OCUVITE PRESERVISION PO) Take 1 tablet by mouth daily.    Historical Provider, MD  pantoprazole (PROTONIX) 40 MG tablet Take 1 tablet (40 mg total) by mouth 2 (two) times daily. 06/23/13   Joseph Art, DO  rOPINIRole (REQUIP) 2 MG tablet Take 1 tablet (2 mg total) by mouth 2 (two) times daily. 12/13/13   Mihai Croitoru, MD  warfarin (COUMADIN) 5 MG tablet Take 1 tablet (5 mg total) by mouth daily. Patient taking differently: Take 2.5-5 mg by mouth daily at 6 PM. Patient takes 1 tablet daily except 1/2 tablet(2.5mg ) on Monday Wednesday, and Friday 11/18/13   Phillips Hay, RPH-CPP    ALLERGIES:  Allergies  Allergen Reactions  . Statins Other (See Comments)    myalgias  . Aspirin Hives    Patient states that he can take the enteric coated but not the uncoated  . Feraheme [Ferumoxytol] Rash and Other (See Comments)    Abdominal pain  . Penicillins Hives    SOCIAL HISTORY:  History  Substance Use Topics  . Smoking status: Former Smoker    Types: Cigarettes    Quit date: 02/11/1957  . Smokeless tobacco: Never Used  . Alcohol Use: No    FAMILY HISTORY: Family History  Problem Relation Age of Onset  . Hypertension Mother   . Kidney disease Brother     EXAM: BP 116/60 mmHg  Pulse 71  Temp(Src) 97.6 F (36.4 C) (Oral)  Resp 16  SpO2 99% CONSTITUTIONAL: Alert and oriented and responds appropriately to questions. Well-appearing; well-nourished, elderly, chronically ill-appearing HEAD: Normocephalic EYES: Conjunctivae clear, PERRL ENT: normal nose; no rhinorrhea; moist mucous membranes; pharynx without lesions noted NECK: Supple, no meningismus, no LAD  CARD: RRR; S1 and S2 appreciated; no murmurs, no clicks, no rubs, no gallops RESP: Normal chest excursion without splinting or tachypnea; breath sounds clear and equal bilaterally; no wheezes, no rhonchi, no rales, no hypoxia or respiratory distress, speaking full  sentences ABD/GI: Normal bowel sounds; non-distended; soft, non-tender, no rebound, no guarding RECTAL:  Normal rectal tone, no gross blood or melena, no hemorrhoids BACK:  The back appears normal and is non-tender to palpation, there is no CVA tenderness EXT: Normal ROM in all joints; non-tender to palpation; no edema; normal capillary refill; no cyanosis; AV fistula in the left upper extremity, 2+ radial pulses bilaterally, component soft, no joint effusions    SKIN: Normal color for age and race; warm NEURO: Moves all extremities equally PSYCH: The patient's mood and manner are appropriate. Grooming and personal hygiene are appropriate.  MEDICAL DECISION MAKING: Patient here with chest pain. His EKG shows left bundle branch block that seems similar to his recent EKG during his last admission. He is currently chest pain-free and has no shortness of breath. Pain gone after aspirin and nitroglycerin. He  was recently admitted for symptomatic anemia. Will check labs, chest x-ray. We'll keep on monitor. Anticipate admission in his significant cardiac history.  ED PROGRESS: Patient's hemoglobin is 7.2. He is mildly guaiac positive on exam and has an elevated INR 1.88. He does not have a significant GI bleed and I do not feel that his INR needs to be reversed at this time. I suspect his anemia is more likely secondary to his chronic kidney disease. His potassium is low at 2.5 but he does have a history of chronic kidney disease is not yet being dialyzed. Will replace this slowly use of that we do not overshoot and make him hyperkalemic. No significant interval changes on his EKG. He has a left bundle branch block which was present during his last admission. Troponin is 0.08. Discussed with Dr. Doran Stabler I recommend admission for blood transfusion given his history of coronary artery disease and ACS rule out. Patient is still asymptomatic. Discussed this with patient and family. They verbalize understanding  and are comfortable with plan. Will admit to telemetry, observation.    EKG Interpretation  Date/Time:  Friday March 24 2014 08:26:56 EST Ventricular Rate:  72 PR Interval:  183 QRS Duration: 212 QT Interval:  571 QTC Calculation: 625 R Axis:   -74 Text Interpretation:  Sinus or ectopic atrial rhythm Left bundle branch block Artifact in lead(s) I aVR V1 Confirmed by [facility]  MD, APRIL ([id number]) on 03/24/2014 8:29:34 AM        CRITICAL CARE Performed by: Raelyn Number   Total critical care time: 40 minutes  Critical care time was exclusive of separately billable procedures and treating other patients.  Critical care was necessary to treat or prevent imminent or life-threatening deterioration.  Critical care was time spent personally by me on the following activities: development of treatment plan with patient and/or surrogate as well as nursing, discussions with consultants, evaluation of patient's response to treatment, examination of patient, obtaining history from patient or surrogate, ordering and performing treatments and interventions, ordering and review of laboratory studies, ordering and review of radiographic studies, pulse oximetry and re-evaluation of patient's condition.    Layla Maw Ward, DO 03/24/14 1054  Kristen N Ward, DO 03/24/14 1131

## 2014-03-24 NOTE — Consult Note (Signed)
Reason for Consult: Anemia and Heme positive stool Referring Physician: [facility]  Nathaniel Steele HPI: This is an 81 year old male with a PMH of GI bleed who presents to the ER with complaints of chest pain. Associated with his chest pain was some nausea, but he did not have any issues with hematemesis.  During the work up he was found to be in acute renal failure and he was anemic as well as heme positive.  In June last year he underwent work up for his anemia, but the EGD, colonoscopy, and capsule endoscopy failed to isolate the site of bleeding.  Since that time he has remained on iron supplementation and he has black stools.  No reports of hematochezia.  I had discussed with him in the past that if he rebleeds then a repeat capsule endoscopy will be pursued.  Past Medical History  Diagnosis Date  . Shortness of breath with exertion  . Chronic kidney disease     not on dialysis yet  . Coronary artery disease 05/14/2010    stress test - no scintigraphic evidence of inducible myocardial ischemia,; normal study  . Hypertension   . Arthritis   . Blood dyscrasia     one time had low platlet count  . Dysrhythmia     atrial fib  . SSS (sick sinus syndrome) 08/27/2011    echo EF >55%, severe LAE, moderate RAE, tissue AVR w/ gradients 35 and 17mmHg  . Pericardial effusion 03/12/2011    echo EF 55-60%  . Claudication 02/19/2012    LE doppler no evidence of arterial insufficiency, lower extremities demonstrate normal values  w/ no evidence of insufficiency  . Lupus nephritis   . Pacemaker 09/19/2004    implanted  . Restless leg syndrome   . Cellulitis   . Anemia   . Benign prostatic hypertrophy   . Constipation   . Shoulder joint pain   . Acute on chronic diastolic CHF (congestive heart failure), NYHA class 3 12/07/2013    Past Surgical History  Procedure Laterality Date  . Appendectomy    . Hernia repair    . Pacemaker insertion  2006    [employer] EnRhythm  . Av fistula  placement  02/24/2011  . Aortic valve replacement  02/24/2011    pericardial tissue valve [facility]a Ease  . Coronary artery bypass graft  02/24/2011    LIMA to LAD, SVG to distal RCA  . Cardiac catheterization  02/13/2011    mod/severe aortic valve stenosis w peak to peak gradient 25-32 mmHg,  70% stenosis LAD, 30-40%proximal followed by 90% sstenosis in RCA prior to anterior RV margin branch  . Lumbar laminectomy/decompression microdiscectomy Right 04/16/2012    Procedure: DECOMPRESSIVE L4 - L5/ MICRODISCECTOMY ON THE RIGHT 1 LEVEL;  Surgeon: Jacki Conesonald A Gioffre, MD;  Location: [facility];  Service: Orthopedics;  Laterality: Right;  . Esophagogastroduodenoscopy N/A 06/21/2013    Procedure: ESOPHAGOGASTRODUODENOSCOPY (EGD);  Surgeon: Theda BelfastPatrick D Seyon Strader, MD;  [facility];  Service: Endoscopy;  Laterality: N/A;  . Givens capsule study N/A 06/21/2013    Procedure: GIVENS CAPSULE STUDY;  Surgeon: Theda BelfastPatrick D Courtez Twaddle, MD;  [facility];  Service: Endoscopy;  Laterality: N/A;  . Cardioversion N/A 12/09/2013    Procedure: CARDIOVERSION;  Surgeon: Thurmon FairMihai Croitoru, MD;  Location: [facility] ENDOSCOPY;  Service: Cardiovascular;  Laterality: N/A;  . Left and right heart catheterization with coronary angiogram N/A 02/13/2011    Procedure: LEFT AND RIGHT HEART CATHETERIZATION WITH CORONARY ANGIOGRAM;  Surgeon: Maisie Fushomas  Alphonsus Sias, MD;  Location: [facility] CATH LAB;  Service: Cardiovascular;  Laterality: N/A;  . Permanent pacemaker generator change N/A 05/10/2013    Procedure: PERMANENT PACEMAKER GENERATOR CHANGE;  Surgeon: Thurmon Fair, MD;  Location: [facility] CATH LAB;  Service: Cardiovascular;  Laterality: N/A;    Family History  Problem Relation Age of Onset  . Hypertension Mother   . Kidney disease Brother     Social History:  reports that he quit smoking about 57 years ago. His smoking use included Cigarettes. He has never used smokeless tobacco. He reports that he does not drink alcohol or use illicit drugs.  Allergies:   Allergies  Allergen Reactions  . Statins Other (See Comments)    myalgias  . Aspirin Hives    Patient states that he can take the enteric coated but not the uncoated  . Feraheme [Ferumoxytol] Rash and Other (See Comments)    Abdominal pain  . Penicillins Hives    Medications:  Scheduled: . sodium chloride  10 mL/hr Intravenous Once  . amiodarone  200 mg Oral Daily  . pantoprazole (PROTONIX) IV  40 mg Intravenous Q12H  . potassium chloride  40 mEq Oral Q4H  . rOPINIRole  2 mg Oral BID   Continuous: . sodium chloride 100 mL/hr at 03/24/14 1548    Results for orders placed or performed during the hospital encounter of 03/24/14 (from the past 24 hour(s))  CBC with Differential     Status: Abnormal   Collection Time: 03/24/14  9:05 AM  Result Value Ref Range   WBC 4.2 4.0 - 10.5 K/uL   RBC 2.45 (L) 4.22 - 5.81 MIL/uL   Hemoglobin 7.2 (L) 13.0 - 17.0 g/dL   HCT 04.5 (L) 40.9 - 81.1 %   MCV 89.8 78.0 - 100.0 fL   MCH 29.4 26.0 - 34.0 pg   MCHC 32.7 30.0 - 36.0 g/dL   RDW 91.4 (H) 78.2 - 95.6 %   Platelets 106 (L) 150 - 400 K/uL   Neutrophils Relative % 78 (H) 43 - 77 %   Neutro Abs 3.3 1.7 - 7.7 K/uL   Lymphocytes Relative 13 12 - 46 %   Lymphs Abs 0.6 (L) 0.7 - 4.0 K/uL   Monocytes Relative 7 3 - 12 %   Monocytes Absolute 0.3 0.1 - 1.0 K/uL   Eosinophils Relative 2 0 - 5 %   Eosinophils Absolute 0.1 0.0 - 0.7 K/uL   Basophils Relative 0 0 - 1 %   Basophils Absolute 0.0 0.0 - 0.1 K/uL  Basic metabolic panel     Status: Abnormal   Collection Time: 03/24/14  9:05 AM  Result Value Ref Range   Sodium 137 135 - 145 mmol/L   Potassium 2.5 (LL) 3.5 - 5.1 mmol/L   Chloride 88 (L) 96 - 112 mmol/L   CO2 31 19 - 32 mmol/L   Glucose, Bld 103 (H) 70 - 99 mg/dL   BUN 213 (H) 6 - 23 mg/dL   Creatinine, Ser 0.86 (H) 0.50 - 1.35 mg/dL   Calcium 9.3 8.4 - 57.8 mg/dL   GFR calc non Af Amer 11 (L) >90 mL/min   GFR calc Af Amer 12 (L) >90 mL/min   Anion gap 18 (H) 5 - 15  APTT      Status: Abnormal   Collection Time: 03/24/14  9:10 AM  Result Value Ref Range   aPTT 42 (H) 24 - 37 seconds  Protime-INR     Status: Abnormal  Collection Time: 03/24/14  9:10 AM  Result Value Ref Range   Prothrombin Time 21.8 (H) 11.6 - 15.2 seconds   INR 1.88 (H) 0.00 - 1.49  I-stat troponin, ED     Status: None   Collection Time: 03/24/14  9:12 AM  Result Value Ref Range   Troponin i, poc 0.08 0.00 - 0.08 ng/mL   Comment NOTIFIED PHYSICIAN    Comment 3          Type and screen     Status: None (Preliminary result)   Collection Time: 03/24/14 10:10 AM  Result Value Ref Range   ABO/RH(D) A POS    Antibody Screen NEG    Sample Expiration 03/27/2014    Unit Number [id number]    Blood Component Type RED CELLS,LR    Unit division 00    Status of Unit ALLOCATED    Transfusion Status OK TO TRANSFUSE    Crossmatch Result Compatible    Unit Number [id number]    Blood Component Type RED CELLS,LR    Unit division 00    Status of Unit ISSUED    Transfusion Status OK TO TRANSFUSE    Crossmatch Result Compatible   Prepare RBC     Status: None   Collection Time: 03/24/14 10:10 AM  Result Value Ref Range   Order Confirmation ORDER PROCESSED BY BLOOD BANK   POC occult blood, ED Provider will collect     Status: Abnormal   Collection Time: 03/24/14 10:13 AM  Result Value Ref Range   Fecal Occult Bld POSITIVE (A) NEGATIVE     Dg Chest 2 View  03/24/2014   CLINICAL DATA:  Chest pain and cough, 2 days duration.  EXAM: CHEST  2 VIEW  COMPARISON:  12/22/2013.  06/21/2013.  Multiple previous.  FINDINGS: There is been previous median sternotomy and aortic valve replacement. Dual lead pacemaker remains in place. Cardiomegaly is again seen. The aorta shows atherosclerosis an unfolding. Pulmonary edema seen on the previous study is improved, but there is probably minimal residual interstitial edema. No effusions. No consolidation or collapse. Small nodular shadows on each side of the chest may  relate to the edema pattern but should be observed on subsequent imaging. No acute bony finding.  IMPRESSION: Previous CABG, AVR and pacemaker. Cardiomegaly. Aortic atherosclerosis. Mild interstitial edema. Edema pattern is improved compared to the study of December 2015.  Small pulmonary nodular shadows bilaterally, most likely relating to the edema pattern. Follow-up on subsequent exams.   Electronically Signed   By: Paulina Fusi M.D.   On: 03/24/2014 09:32    ROS:  As stated above in the HPI otherwise negative.  Blood pressure 119/57, pulse 71, temperature 97.6 F (36.4 C), temperature source Oral, resp. rate 16, height  (1.702 m), SpO2 97 %.    PE: Gen: NAD, Alert and Oriented HEENT:  [place]/AT, EOMI Neck: Supple, no LAD Lungs: CTA Bilaterally CV: RRR without M/G/R ABM: Soft, NTND, +BS Ext: No C/C/E  Assessment/Plan: 1) Anemia. 2) Heme positive stool. 3) ARF.   At this time, I think it will be prudent to repeat the capsule endoscopy.  His BUN elevation may be from bleeding, but it may also be from his ARF.  Hopefully the repeat capsule will help to yield the site of bleeding.  Plan: 1) Capsule endoscopy in the AM. 2) Follow HGB and transfuse as necessary.  Nathaniel Steele D 03/24/2014, 4:31 PM

## 2014-03-24 NOTE — Progress Notes (Signed)
Unit CM UR Completed by [facility] ED CM  W. Caydn Justen RN  

## 2014-03-24 NOTE — H&P (Signed)
History and Physical  Nathaniel Steele ZOX:[id number] DOB: Sep 01, 1926 DOA: 03/24/2014  Referring physician: Dr. Elesa Massed in ED PCP: Delorse Lek, MD  Cardiologist: Dr. Royann Shivers Nephrologisd: Dr. Eliott Nine  Chief Complaint: Chest pain  HPI:  81 year old man with history of coronary artery disease, CABG, chronic from cytopenia and chronic anemia presented to the emergency department with three-day history of chest pain, relieved with aspirin and nitroglycerin. Initial evaluation revealed acute on chronic anemia, markedly elevated BUN, acute renal failure. Troponin was within normal limits, EKG nonacute and patient was chest pain-free. He was referred for further evaluation of symptomatic anemia, chest pain and plans were made for transfusion in the emergency department.  Patient reports 3 day history of intermittent chest pain lasting one or 2 minutes, described as a pressure-like sensation with some radiation to his left arm. Some associated shortness of breath with this. No diaphoresis. Some nausea and one or 2 episodes of vomiting. He has a history of anemia and takes iron for this so his stools chronically dark. He has noticed no bleeding. He does take injections for anemia per his nephrologist.  Discharge 2/27 after being evaluated and treated for severe/symptomatic anemia with hemoglobin of 5.2 on admission, transfused 4 units packed red blood cells. No evidence of GI bleed according to notes. Etiology of anemia not clear.   In the emergency department afebrile, vital signs stable. No hypoxia. Potassium 2.5, creatinine stable or 0.55. BUN significantly elevated 228 (186 February 27), creatinine above baseline 4.55 but improved since 2/27. Point of care troponin was within normal limits. Hemoglobin again slightly lower 7.2, platelet count 106. INR 1.88. Chest x-ray mild interstitial edema improved compared to previous study. EKG shows sinus rhythm with prominent left bundle branch block, compared to  previous study 03/17/2014, no acute changes seen.   Review of Systems:  Negative for fever, visual changes, sore throat, rash, new muscle aches, dysuria, bleeding, n/v/abdominal pain.  Past Medical History  Diagnosis Date  . Shortness of breath with exertion  . Chronic kidney disease     not on dialysis yet  . Coronary artery disease 05/14/2010    stress test - no scintigraphic evidence of inducible myocardial ischemia,; normal study  . Hypertension   . Arthritis   . Blood dyscrasia     one time had low platlet count  . Dysrhythmia     atrial fib  . SSS (sick sinus syndrome) 08/27/2011    echo EF >55%, severe LAE, moderate RAE, tissue AVR w/ gradients 35 and  . Pericardial effusion 03/12/2011    echo EF 55-60%  . Claudication 02/19/2012    LE doppler no evidence of arterial insufficiency, lower extremities demonstrate normal values  w/ no evidence of insufficiency  . Lupus nephritis   . Pacemaker 09/19/2004    implanted  . Restless leg syndrome   . Cellulitis   . Anemia   . Benign prostatic hypertrophy   . Constipation   . Shoulder joint pain   . Acute on chronic diastolic CHF (congestive heart failure), NYHA class 3 12/07/2013    Past Surgical History  Procedure Laterality Date  . Back surgery    . Appendectomy    . Hernia repair    . Pacemaker insertion  2006    [employer] EnRhythm  . Av fistula placement  02/24/2011  . Aortic valve replacement  02/24/2011    pericardial tissue valve [facility] Ease  . Coronary artery bypass graft  02/24/2011    LIMA to LAD,  SVG to distal RCA  . Cardiac catheterization  02/13/2011    mod/severe aortic valve stenosis w peak to peak gradient 25-32 mmHg,  70% stenosis LAD, 30-40%proximal followed by 90% sstenosis in RCA prior to anterior RV margin branch  . Lumbar laminectomy/decompression microdiscectomy Right 04/16/2012    Procedure: DECOMPRESSIVE L4 - L5/ MICRODISCECTOMY ON THE RIGHT 1 LEVEL;  Surgeon: Jacki Cones, MD;  Location:  [facility] ORS;  Service: Orthopedics;  Laterality: Right;  . Esophagogastroduodenoscopy N/A 06/21/2013    Procedure: ESOPHAGOGASTRODUODENOSCOPY (EGD);  Surgeon: Theda Belfast, MD;  Location: [facility] ENDOSCOPY;  Service: Endoscopy;  Laterality: N/A;  . Givens capsule study N/A 06/21/2013    Procedure: GIVENS CAPSULE STUDY;  Surgeon: Theda Belfast, MD;  Location: [facility] ENDOSCOPY;  Service: Endoscopy;  Laterality: N/A;  . Cardioversion N/A 12/09/2013    Procedure: CARDIOVERSION;  Surgeon: Thurmon Fair, MD;  Location: [facility] ENDOSCOPY;  Service: Cardiovascular;  Laterality: N/A;  . Left and right heart catheterization with coronary angiogram N/A 02/13/2011    Procedure: LEFT AND RIGHT HEART CATHETERIZATION WITH CORONARY ANGIOGRAM;  Surgeon: Lennette Bihari, MD;  Location: [facility] CATH LAB;  Service: Cardiovascular;  Laterality: N/A;  . Permanent pacemaker generator change N/A 05/10/2013    Procedure: PERMANENT PACEMAKER GENERATOR CHANGE;  Surgeon: Thurmon Fair, MD;  Location: [facility] CATH LAB;  Service: Cardiovascular;  Laterality: N/A;    Social History:  reports that he quit smoking about 57 years ago. His smoking use included Cigarettes. He has never used smokeless tobacco. He reports that he does not drink alcohol or use illicit drugs.  Allergies  Allergen Reactions  . Statins Other (See Comments)    myalgias  . Aspirin Hives    Patient states that he can take the enteric coated but not the uncoated  . Feraheme [Ferumoxytol] Rash and Other (See Comments)    Abdominal pain  . Penicillins Hives    Family History  Problem Relation Age of Onset  . Hypertension Mother   . Kidney disease Brother      Prior to Admission medications   Medication Sig Start Date End Date Taking? Authorizing Provider  amiodarone (PACERONE) 200 MG tablet Take 1 tablet (200 mg total) by mouth daily. 02/16/14  Yes Mihai Croitoru, MD  aspirin EC 81 MG tablet Take 81 mg by mouth every morning.    Yes Historical Provider, MD  calcitRIOL  (ROCALTROL) 0.25 MCG capsule Take 0.25 mcg by mouth daily before breakfast.    Yes Historical Provider, MD  calcium acetate (PHOSLO) 667 MG capsule Take 667 mg by mouth every morning.    Yes Historical Provider, MD  Cyanocobalamin (VITAMIN B 12 PO) Take 1 tablet by mouth daily.   Yes Historical Provider, MD  cycloSPORINE (RESTASIS) 0.05 % ophthalmic emulsion Place 1 drop into both eyes 2 (two) times daily.    Yes Historical Provider, MD  finasteride (PROSCAR) 5 MG tablet Take 5 mg by mouth daily.   Yes Historical Provider, MD  fish oil-omega-3 fatty acids 1000 MG capsule Take 1 g by mouth daily.    Yes Historical Provider, MD  furosemide (LASIX) 40 MG tablet Take 2 tablets (80 mg total) by mouth 2 (two) times daily. Takes 4 tablets Patient taking differently: Take 160 mg by mouth 2 (two) times daily.  12/25/13  Yes Leroy Sea, MD  metoprolol (LOPRESSOR) 50 MG tablet Take 50 mg by mouth daily before breakfast.    Yes Historical Provider, MD  Multiple Vitamins-Minerals (OCUVITE PRESERVISION PO) Take  1 tablet by mouth daily.   Yes Historical Provider, MD  pantoprazole (PROTONIX) 40 MG tablet Take 1 tablet (40 mg total) by mouth 2 (two) times daily. 06/23/13  Yes Joseph ArtJessica U Vann, DO  rOPINIRole (REQUIP) 2 MG tablet Take 1 tablet (2 mg total) by mouth 2 (two) times daily. 12/13/13  Yes Mihai Croitoru, MD  warfarin (COUMADIN) 5 MG tablet Take 1 tablet (5 mg total) by mouth daily. Patient taking differently: Take 2.5-5 mg by mouth daily at 6 PM. Patient takes 1 tablet daily except 1/2 tablet(2.5mg ) on Monday Wednesday, and Friday 11/18/13  Yes Phillips HayKristin Alvstad, RPH-CPP   Physical Exam: Filed Vitals:   03/24/14 0823  BP: 116/60  Pulse: 71  Temp: 97.6 F (36.4 C)  TempSrc: Oral  Resp: 16  SpO2: 99%   General: Examined in the emergency department. Appears calm and comfortable Eyes: PERRL, normal lids, irises  ENT: grossly normal hearing, lips & tongue Neck: Appears grossly normal Cardiovascular:  RRR, no m/r/g. No LE edema. Respiratory: CTA bilaterally, no w/r/r. Normal respiratory effort. Abdomen: soft, ntnd Skin: no rash or induration noted Musculoskeletal: grossly normal tone BUE/BLE Psychiatric: grossly normal mood and affect, speech fluent and appropriate Neurologic: grossly non-focal.  Wt Readings from Last 3 Encounters:  03/17/14 77.928 kg (171 lb 12.8 oz)  02/16/14 78.563 kg (173 lb 3.2 oz)  02/13/14 75.297 kg (166 lb)    Labs on Admission:  Basic Metabolic Panel:  Recent Labs Lab 03/17/14 1725 03/18/14 0632 03/24/14 0905  NA 134* 136 137  K 3.1* 2.8* 2.5*  CL 90* 90* 88*  CO2 29 29 31   GLUCOSE 112* 93 103*  BUN 204* 186* 228*  CREATININE 4.90* 4.60* 4.55*  CALCIUM 9.0 9.0 9.3    Liver Function Tests:  Recent Labs Lab 03/18/14 0632  AST 24  ALT 17  ALKPHOS 37*  BILITOT 1.0  PROT 5.6*  ALBUMIN 3.4*   CBC:  Recent Labs Lab 03/17/14 1725 03/18/14 0632 03/18/14 1632 03/24/14 0905  WBC 4.0 4.3 5.1 4.2  NEUTROABS 2.9  --   --  3.3  HGB 5.9* 6.7* 8.2* 7.2*  HCT 18.4* 20.6* 24.9* 22.0*  MCV 90.2 88.4 87.7 89.8  PLT 91* 84* 84* 106*    Recent Labs  03/24/14 0912  TROPIPOC 0.08    Radiological Exams on Admission: Dg Chest 2 View  03/24/2014   CLINICAL DATA:  Chest pain and cough, 2 days duration.  EXAM: CHEST  2 VIEW  COMPARISON:  12/22/2013.  06/21/2013.  Multiple previous.  FINDINGS: There is been previous median sternotomy and aortic valve replacement. Dual lead pacemaker remains in place. Cardiomegaly is again seen. The aorta shows atherosclerosis an unfolding. Pulmonary edema seen on the previous study is improved, but there is probably minimal residual interstitial edema. No effusions. No consolidation or collapse. Small nodular shadows on each side of the chest may relate to the edema pattern but should be observed on subsequent imaging. No acute bony finding.  IMPRESSION: Previous CABG, AVR and pacemaker. Cardiomegaly. Aortic  atherosclerosis. Mild interstitial edema. Edema pattern is improved compared to the study of December 2015.  Small pulmonary nodular shadows bilaterally, most likely relating to the edema pattern. Follow-up on subsequent exams.   Electronically Signed   By: Paulina FusiMark  Shogry M.D.   On: 03/24/2014 09:32    Principal Problem:   Chest pain Active Problems:   S/P CABG x 2: (LIMA-LAD  SVG - RCA)   PAF (paroxysmal atrial fibrillation)   Pacemaker,  MDT implanted 08/06, generator change April 2015   Chronic anticoagulation, on Coumadin prior to admission   Symptomatic anemia   CKD (chronic kidney disease) stage 5, GFR less than 15 ml/min   ARF (acute renal failure)   Anemia   Assessment/Plan 1. Chest pain, atypical, suspect symptomatic anemia. EKG with left bundle bread known acute features seen, initial troponin negative. Doubt ACS. Has already received aspirin today. 2. Coronary artery disease, status post CABG with pericardial tissue valve aortic valve replacement for aortic stenosis 2013. 3. Normocytic anemia, acute on chronic, suspect symptomatic. Long-standing, chronic with baseline 7-9. Etiology unclear, certainly suspect anemia of chronic disease/renal disease given long-standing history (on injections per nephrology), however significantly elevated BUN does suggest subacute upper GI bleed (seen 02/2014). Fecal occult blood positive. Plan GI consultation. He was evaluated June 2015 by Dr. Elnoria Howard with EGD and capsule study which was unremarkable. Anemia studies 02/2014 suggested chronic disease and received transfusion during that hospitalization 4. Possible GI bleed, suspect subacute/chronic. 5. History of sick sinus syndrome, status post pacemaker placement; according to chart paroxysmal atrial fibrillation ablation on amiodarone and warfarin 6. Chronic diastolic congestive heart failure, NYHA class 3. Appears compensated. 7. Chronic kidney disease, lupus nephritis with acute renal failure  superimposed with markedly elevated BUN. CO2 normal, 31.  8. Chronic thrombocytopenia, etiology unclear. Stable.  9. Hypokalemia. Replete.   Plan admission to telemetry. Suspect his chest pain is actually symptomatic anemia. Cycle troponin, however if no recurrent pain and troponin remains within normal limits do not think further cardiology evaluation warranted.  Plan transfusion 2 units packed red blood cells. GI consultation (Dr. Elnoria Howard) for further evaluation. INR 1.88 (on warfarin for atrial fibrillation). Resume warfarin after GI evaluation complete.  Empiric PPI. No evidence of ongoing bleeding. Suspect slow chronic bleed.  Replace potassium.  Discussed in detail with nephrologist Dr. Arrie Aran; markedly elevated BUN likely related to GI bleed, also consider obstruction. Clinical picture suggests bleeding. Not primarily related to the patient's kidney function. If fails to improve, consider renal imaging.  Code Status: full code  DVT prophylaxis: SCDs Family Communication: discussed in detail with wife and son at bedside Disposition Plan/Anticipated LOS: admit, 2 days  Time spent: 89 minutes  Brendia Sacks, MD  [facility] Pager [phone] 03/24/2014, 10:36 AM

## 2014-03-24 NOTE — ED Notes (Signed)
Gus PumaSon, Rodney:  [phone]

## 2014-03-24 NOTE — ED Notes (Signed)
Troponin results given to Dr. Elesa MassedWard

## 2014-03-24 NOTE — Progress Notes (Signed)
Paged Dr. Irene LimboGoodrich concerning pt home medication lasix and metoprolol. Awaiting callback.

## 2014-03-25 ENCOUNTER — Observation Stay (HOSPITAL_COMMUNITY): Payer: Medicare Other

## 2014-03-25 ENCOUNTER — Encounter (HOSPITAL_COMMUNITY)
Admission: EM | Disposition: A | Discharge status: Home / Self Care | Payer: Self-pay | Source: Home / Self Care | Attending: Internal Medicine

## 2014-03-25 ENCOUNTER — Encounter (HOSPITAL_COMMUNITY): Payer: Self-pay | Admitting: *Deleted

## 2014-03-25 DIAGNOSIS — R079 Chest pain, unspecified: Secondary | ICD-10-CM | POA: Diagnosis not present

## 2014-03-25 DIAGNOSIS — R072 Precordial pain: Secondary | ICD-10-CM | POA: Diagnosis not present

## 2014-03-25 DIAGNOSIS — I480 Paroxysmal atrial fibrillation: Secondary | ICD-10-CM | POA: Diagnosis not present

## 2014-03-25 DIAGNOSIS — D500 Iron deficiency anemia secondary to blood loss (chronic): Secondary | ICD-10-CM | POA: Diagnosis not present

## 2014-03-25 DIAGNOSIS — N185 Chronic kidney disease, stage 5: Secondary | ICD-10-CM | POA: Diagnosis not present

## 2014-03-25 DIAGNOSIS — I209 Angina pectoris, unspecified: Secondary | ICD-10-CM | POA: Diagnosis not present

## 2014-03-25 DIAGNOSIS — D649 Anemia, unspecified: Secondary | ICD-10-CM

## 2014-03-25 HISTORY — PX: GIVENS CAPSULE STUDY: SHX5432

## 2014-03-25 LAB — BASIC METABOLIC PANEL
ANION GAP: 17 — AB (ref 5–15)
BUN: 204 mg/dL — AB (ref 6–23)
CHLORIDE: 96 mmol/L (ref 96–112)
CO2: 29 mmol/L (ref 19–32)
Calcium: 9.3 mg/dL (ref 8.4–10.5)
Creatinine, Ser: 4.33 mg/dL — ABNORMAL HIGH (ref 0.50–1.35)
GFR calc Af Amer: 13 mL/min — ABNORMAL LOW (ref >90)
GFR, EST NON AFRICAN AMERICAN: 11 mL/min — AB (ref >90)
Glucose, Bld: 99 mg/dL (ref 70–99)
POTASSIUM: 3.2 mmol/L — AB (ref 3.5–5.1)
Sodium: 142 mmol/L (ref 135–145)

## 2014-03-25 LAB — TYPE AND SCREEN
ABO/RH(D): A POS
ANTIBODY SCREEN: NEGATIVE
Unit division: 0
Unit division: 0

## 2014-03-25 LAB — HEPATIC FUNCTION PANEL
ALK PHOS: 59 U/L (ref 39–117)
ALT: 26 U/L (ref 0–53)
AST: 53 U/L — AB (ref 0–37)
Albumin: 3.9 g/dL (ref 3.5–5.2)
BILIRUBIN INDIRECT: 0.7 mg/dL (ref 0.3–0.9)
Bilirubin, Direct: 0.3 mg/dL (ref 0.0–0.5)
Total Bilirubin: 1 mg/dL (ref 0.3–1.2)
Total Protein: 7 g/dL (ref 6.0–8.3)

## 2014-03-25 LAB — GLUCOSE, CAPILLARY: GLUCOSE-CAPILLARY: 142 mg/dL — AB (ref 70–99)

## 2014-03-25 LAB — CBC
HCT: 29.9 % — ABNORMAL LOW (ref 39.0–52.0)
HEMOGLOBIN: 9.8 g/dL — AB (ref 13.0–17.0)
MCH: 29.1 pg (ref 26.0–34.0)
MCHC: 32.8 g/dL (ref 30.0–36.0)
MCV: 88.7 fL (ref 78.0–100.0)
PLATELETS: 123 10*3/uL — AB (ref 150–400)
RBC: 3.37 MIL/uL — ABNORMAL LOW (ref 4.22–5.81)
RDW: 17.9 % — ABNORMAL HIGH (ref 11.5–15.5)
WBC: 5 10*3/uL (ref 4.0–10.5)

## 2014-03-25 LAB — TROPONIN I: TROPONIN I: 0.12 ng/mL — AB (ref <0.031)

## 2014-03-25 LAB — PROTIME-INR
INR: 1.8 — ABNORMAL HIGH (ref 0.00–1.49)
Prothrombin Time: 21 seconds — ABNORMAL HIGH (ref 11.6–15.2)

## 2014-03-25 SURGERY — IMAGING PROCEDURE, GI TRACT, INTRALUMINAL, VIA CAPSULE
Anesthesia: LOCAL

## 2014-03-25 MED ORDER — METOPROLOL TARTRATE 50 MG PO TABS
50.0000 mg | ORAL_TABLET | Freq: Every day | ORAL | Status: DC
  Administered 2014-03-26: 50 mg via ORAL
  Filled 2014-03-25 (×2): qty 1

## 2014-03-25 MED ORDER — CYCLOSPORINE 0.05 % OP EMUL
1.0000 [drp] | Freq: Two times a day (BID) | OPHTHALMIC | Status: DC
  Administered 2014-03-25 – 2014-03-26 (×2): 1 [drp] via OPHTHALMIC
  Filled 2014-03-25 (×4): qty 1

## 2014-03-25 MED ORDER — PANTOPRAZOLE SODIUM 40 MG PO TBEC: 40.0000 mg | DELAYED_RELEASE_TABLET | Freq: Two times a day (BID) | ORAL | Status: DC

## 2014-03-25 SURGICAL SUPPLY — 1 items: TOWEL COTTON PACK 4EA (MISCELLANEOUS) ×4 IMPLANT

## 2014-03-25 NOTE — Progress Notes (Signed)
     Progress Note   Subjective  *No further complaints of chest pain.  Undergoing capsule endoscopy.**   Objective  Vital signs in last 24 hours: Temp:  [97.5 F (36.4 C)-98.1 F (36.7 C)] 98.1 F (36.7 C) (03/04 2014) Pulse Rate:  [69-73] 72 (03/04 2014) Resp:  [12-20] 18 (03/04 2014) BP: (105-126)/(54-66) 113/54 mmHg (03/04 2014) SpO2:  [96 %-99 %] 97 % (03/04 1430) Weight:  [157 lb (71.215 kg)-157 lb 13.6 oz (71.6 kg)] 157 lb (71.215 kg) (03/05 0827) Last BM Date: 03/23/14 General:   Alert,  Well-developed,   in NAD Heart:  Regular rate and rhythm; no murmurs Abdomen:  Soft, nontender and nondistended. Normal bowel sounds, without guarding, and without rebound.   Extremities:  Without edema. Neurologic:  Alert and  oriented x4;  grossly normal neurologically. Psych:  Alert and cooperative. Normal mood and affect.  Intake/Output from previous day: 03/04 0701 - 03/05 0700 In: 965 [P.O.:600; Blood:365] Out: 1575 [Urine:1575] Intake/Output this shift:    Lab Results:  Recent Labs  03/24/14 0905 03/25/14 0930  WBC 4.2 5.0  HGB 7.2* 9.8*  HCT 22.0* 29.9*  PLT 106* 123*   BMET  Recent Labs  03/24/14 0905  NA 137  K 2.5*  CL 88*  CO2 31  GLUCOSE 103*  BUN 228*  CREATININE 4.55*  CALCIUM 9.3   LFT No results for input(s): PROT, ALBUMIN, AST, ALT, ALKPHOS, BILITOT, BILIDIR, IBILI in the last 72 hours. PT/INR  Recent Labs  03/24/14 0910 03/25/14 0930  LABPROT 21.8* 21.0*  INR 1.88* 1.80*   Hepatitis Panel No results for input(s): HEPBSAG, HCVAB, HEPAIGM, HEPBIGM in the last 72 hours.  Studies/Results: Dg Chest 2 View  03/24/2014   CLINICAL DATA:  Chest pain and cough, 2 days duration.  EXAM: CHEST  2 VIEW  COMPARISON:  12/22/2013.  06/21/2013.  Multiple previous.  FINDINGS: There is been previous median sternotomy and aortic valve replacement. Dual lead pacemaker remains in place. Cardiomegaly is again seen. The aorta shows atherosclerosis an  unfolding. Pulmonary edema seen on the previous study is improved, but there is probably minimal residual interstitial edema. No effusions. No consolidation or collapse. Small nodular shadows on each side of the chest may relate to the edema pattern but should be observed on subsequent imaging. No acute bony finding.  IMPRESSION: Previous CABG, AVR and pacemaker. Cardiomegaly. Aortic atherosclerosis. Mild interstitial edema. Edema pattern is improved compared to the study of December 2015.  Small pulmonary nodular shadows bilaterally, most likely relating to the edema pattern. Follow-up on subsequent exams.   Electronically Signed   By: Paulina FusiMark  Shogry M.D.   On: 03/24/2014 09:32      Assessment & Plan  1.  *Chronic GI blood loss.  Currently undergoing capsule endoscopy looking for a small bowel bleeding source 2.  Chest pain-probably demand ischemia  Await results of capsule endoscopy Principal Problem:   Chest pain Active Problems:   S/P CABG x 2: (LIMA-LAD  SVG - RCA)   PAF (paroxysmal atrial fibrillation)   Pacemaker, MDT implanted 08/06, generator change April 2015   Chronic anticoagulation, on Coumadin prior to admission   Symptomatic anemia   CKD (chronic kidney disease) stage 5, GFR less than 15 ml/min   ARF (acute renal failure)   Anemia     LOS: 1 day   Melvia HeapsRobert Adria Costley  03/25/2014, 11:20 AM

## 2014-03-25 NOTE — Progress Notes (Signed)
03/25/2014 10:13 AM Jackie Russman, Justice DeedsJUDITH SPARKS, RN,BSN Utilization review completed.

## 2014-03-25 NOTE — Progress Notes (Signed)
[facility] PROGRESS NOTE  Nathaniel Steele ZOX:[id number] DOB: [birth year] DOA: 03/24/2014 PCP: Nathaniel Lek, MD  Assessment/Plan: 1-Chest pain, mild elevated troponin: probably demand ischemia in setting of anemia.  Resolved after blood transfusion. Pending hb for this morning.  Check ECHO.  Cardio consulted.  Resume metoprolol, holder parameter for hypotension.   2-Acute on chronic Anemia, presume chronic GI bleed:  S/P 2 units of PRBC.  HB for this morning pending.  Plan for capsule endoscopy today.  Continue to hold coumadin. INR pending. Cardiology consulted for recommendation regarding long term coumadin.  Check LFT, bilirubin level.   3-Possible GI bleed, suspect subacute/chronic. Capsule endoscopy today. See problem 2.  Continue with PPI.   History of sick sinus syndrome, status post pacemaker placement; according to chart paroxysmal atrial fibrillation ablation on amiodarone  -hold coumadin. Cardiology consulted for long term anticoagulation recommendation.   Chronic kidney disease, lupus nephritis with acute renal failure superimposed with markedly elevated BUN. CO2 normal, 31.  Cr baseline 3 to 2.6.  BUN, Cr on 2-26 was at 204/4.9.  Labs pending for this morning.  If no significant improvement will check renal US.   History of CABG, Aortic Valve replacement pericardial tissue;   Chronic thrombocytopenia, etiology unclear. Stable.   Hypokalemia:  labs pending for this morning.   Code Status: Full Code.  Family Communication: care discussed with patient  Disposition Plan: remain inpatient for work up of anemia.    Consultants:  GI, Dr Nathaniel Steele  Cardiology  Procedures:  ECHO ordered.   Antibiotics:  none  HPI/Subjective: He relates chest pain has resolved. Got better after blood transfusion.  He report black stool, but he is on iron.   Objective: Filed Vitals:   03/24/14 2014  BP: 113/54  Pulse: 72  Temp: 98.1 F (36.7 C)  Resp: 18     Intake/Output Summary (Last 24 hours) at 03/25/14 0822 Last data filed at 03/25/14 8119  Gross per 24 hour  Intake    965 ml  Output   1575 ml  Net   -610 ml   Filed Weights   03/25/14 0537  Weight: 71.6 kg (157 lb 13.6 oz)    Exam:   General:  Alert in no distress  Cardiovascular: S 1, S 2 RRR  Respiratory: Bilateral crackles.   Abdomen: BS present, distended.   Musculoskeletal: trace edema.   Data Reviewed: Basic Metabolic Panel:  Recent Labs Lab 03/24/14 0905  NA 137  K 2.5*  CL 88*  CO2 31  GLUCOSE 103*  BUN 228*  CREATININE 4.55*  CALCIUM 9.3   Liver Function Tests: No results for input(s): AST, ALT, ALKPHOS, BILITOT, PROT, ALBUMIN in the last 168 hours. No results for input(s): LIPASE, AMYLASE in the last 168 hours. No results for input(s): AMMONIA in the last 168 hours. CBC:  Recent Labs Lab 03/18/14 1632 03/24/14 0905  WBC 5.1 4.2  NEUTROABS  --  3.3  HGB 8.2* 7.2*  HCT 24.9* 22.0*  MCV 87.7 89.8  PLT 84* 106*   Cardiac Enzymes:  Recent Labs Lab 03/24/14 1515 03/24/14 2115 03/25/14 0310  TROPONINI 0.11* 0.10* 0.12*   BNP (last 3 results) No results for input(s): BNP in the last 8760 hours.  ProBNP (last 3 results)  Recent Labs  12/22/13 1850  PROBNP 16536.0*    CBG: No results for input(s): GLUCAP in the last 168 hours.  No results found for this or any previous visit (from the past 240 hour(s)).   Studies: Dg  Chest 2 View  03/24/2014   CLINICAL DATA:  Chest pain and cough, 2 days duration.  EXAM: CHEST  2 VIEW  COMPARISON:  12/22/2013.  06/21/2013.  Multiple previous.  FINDINGS: There is been previous median sternotomy and aortic valve replacement. Dual lead pacemaker remains in place. Cardiomegaly is again seen. The aorta shows atherosclerosis an unfolding. Pulmonary edema seen on the previous study is improved, but there is probably minimal residual interstitial edema. No effusions. No consolidation or collapse. Small  nodular shadows on each side of the chest may relate to the edema pattern but should be observed on subsequent imaging. No acute bony finding.  IMPRESSION: Previous CABG, AVR and pacemaker. Cardiomegaly. Aortic atherosclerosis. Mild interstitial edema. Edema pattern is improved compared to the study of December 2015.  Small pulmonary nodular shadows bilaterally, most likely relating to the edema pattern. Follow-up on subsequent exams.   Electronically Signed   By: Nathaniel FusiMark  Steele M.D.   On: 03/24/2014 09:32    Scheduled Meds: . sodium chloride  10 mL/hr Intravenous Once  . amiodarone  200 mg Oral Daily  . pantoprazole (PROTONIX) IV  40 mg Intravenous Q12H  . rOPINIRole  2 mg Oral BID   Continuous Infusions: . sodium chloride 100 mL/hr at 03/24/14 1548  . sodium chloride      Principal Problem:   Chest pain Active Problems:   S/P CABG x 2: (LIMA-LAD  SVG - RCA)   PAF (paroxysmal atrial fibrillation)   Pacemaker, MDT implanted 08/06, generator change April 2015   Chronic anticoagulation, on Coumadin prior to admission   Symptomatic anemia   CKD (chronic kidney disease) stage 5, GFR less than 15 ml/min   ARF (acute renal failure)   Anemia    Time spent: 35 minutes.     Nathaniel Steele  [facility] Pager [phone]. If 7PM-7AM, please contact night-coverage at [email or web address], password [email or web address] 03/25/2014, 8:22 AM  LOS: 1 day

## 2014-03-25 NOTE — Progress Notes (Signed)
  Echocardiogram 2D Echocardiogram has been performed.  Nathaniel Steele, Nathaniel Steele A 03/25/2014, 12:56 PM

## 2014-03-25 NOTE — Consult Note (Signed)
Primary cardiologist: Dr Sallyanne Kuster  HPI: 81 year old male with past medical history of coronary artery disease status post coronary artery bypass graft/AVR 6222, diastolic congestive heart failure, prior pacemaker placement, history of atrial flutter for evaluation of chest pain and congestive heart failure. Patient apparently was hospitalized in April 2015 with acute anemia secondary to spontaneous bleeding in his right lower extremity. He was also hospitalized in June 2015 with anemia felt secondary to GI blood loss. Evaluation unremarkable other than diverticulosis. Patient required transfusions on both occasions. Last echocardiogram November 2015 showed normal LV function, grade 3 diastolic dysfunction with elevated filling pressures, bioprosthetic aortic valve with mean gradient of 15 mmHg, trace aortic insufficiency and mild mitral regurgitation. There was severe biatrial enlargement and moderate tricuspid regurgitation. Patient was admitted with chest pain. The pain lasts for 1 minute and resolves spontaneously. Not pleuritic, positional or exertional. He has also noticed fatigue. He is anemic and has severe elevation in BUN and creatinine possibly suggesting GI bleeding. Cardiology asked to evaluate.  Medications Prior to Admission  Medication Sig Dispense Refill  . amiodarone (PACERONE) 200 MG tablet Take 1 tablet (200 mg total) by mouth daily. 60 tablet 11  . aspirin EC 81 MG tablet Take 81 mg by mouth every morning.     . calcitRIOL (ROCALTROL) 0.25 MCG capsule Take 0.25 mcg by mouth daily before breakfast.     . calcium acetate (PHOSLO) 667 MG capsule Take 667 mg by mouth every morning.     . Cyanocobalamin (VITAMIN B 12 PO) Take 1 tablet by mouth daily.    . cycloSPORINE (RESTASIS) 0.05 % ophthalmic emulsion Place 1 drop into both eyes 2 (two) times daily.     . finasteride (PROSCAR) 5 MG tablet Take 5 mg by mouth daily.    . fish oil-omega-3 fatty acids 1000 MG capsule Take 1 g by  mouth daily.     . furosemide (LASIX) 40 MG tablet Take 2 tablets (80 mg total) by mouth 2 (two) times daily. Takes 4 tablets (Patient taking differently: Take 160 mg by mouth 2 (two) times daily. ) 60 tablet 0  . metoprolol (LOPRESSOR) 50 MG tablet Take 50 mg by mouth daily before breakfast.     . Multiple Vitamins-Minerals (OCUVITE PRESERVISION PO) Take 1 tablet by mouth daily.    . pantoprazole (PROTONIX) 40 MG tablet Take 1 tablet (40 mg total) by mouth 2 (two) times daily. 60 tablet 0  . rOPINIRole (REQUIP) 2 MG tablet Take 1 tablet (2 mg total) by mouth 2 (two) times daily. 30 tablet 0  . warfarin (COUMADIN) 5 MG tablet Take 1 tablet (5 mg total) by mouth daily. (Patient taking differently: Take 2.5-5 mg by mouth daily at 6 PM. Patient takes 1 tablet daily except 1/2 tablet(2.21m) on Monday Wednesday, and Friday) 90 tablet 1    Allergies  Allergen Reactions  . Statins Other (See Comments)    myalgias  . Aspirin Hives    Patient states that he can take the enteric coated but not the uncoated  . Feraheme [Ferumoxytol] Rash and Other (See Comments)    Abdominal pain  . Penicillins Hives    Past Medical History  Diagnosis Date  . Shortness of breath with exertion  . Chronic kidney disease     not on dialysis yet  . Coronary artery disease 05/14/2010    stress test - no scintigraphic evidence of inducible myocardial ischemia,; normal study  . Hypertension   . Arthritis   .  Blood dyscrasia     one time had low platlet count  . Dysrhythmia     atrial fib  . SSS (sick sinus syndrome) 08/27/2011    echo EF >55%, severe LAE, moderate RAE, tissue AVR w/ gradients 35 and 63mHg  . Pericardial effusion 03/12/2011    echo EF 55-60%  . Claudication 02/19/2012    LE doppler no evidence of arterial insufficiency, lower extremities demonstrate normal values  w/ no evidence of insufficiency  . Lupus nephritis   . Pacemaker 09/19/2004    implanted  . Restless leg syndrome   . Cellulitis   .  Anemia   . Benign prostatic hypertrophy   . Constipation   . Shoulder joint pain   . Acute on chronic diastolic CHF (congestive heart failure), NYHA class 3 12/07/2013    Past Surgical History  Procedure Laterality Date  . Appendectomy    . Hernia repair    . Pacemaker insertion  2006    [employer] EnRhythm  . Av fistula placement  02/24/2011  . Aortic valve replacement  02/24/2011    [facility]  . Coronary artery bypass graft  02/24/2011    LIMA to LAD, SVG to distal RCA  . Cardiac catheterization  02/13/2011    mod/severe aortic valve stenosis w peak to peak gradient 25-32 mmHg,  70% stenosis LAD, 30-40%proximal followed by 90% sstenosis in RCA prior to anterior RV margin branch  . Lumbar laminectomy/decompression microdiscectomy Right 04/16/2012    Procedure: DECOMPRESSIVE L4 - L5/ MICRODISCECTOMY ON THE RIGHT 1 LEVEL;  Surgeon: RTobi Bastos MD;  Location: [facility];  Service: Orthopedics;  Laterality: Right;  . Esophagogastroduodenoscopy N/A 06/21/2013    Procedure: ESOPHAGOGASTRODUODENOSCOPY (EGD);  Surgeon: PBeryle Beams MD;  [facility];  Service: Endoscopy;  Laterality: N/A;  . Givens capsule study N/A 06/21/2013    Procedure: GIVENS CAPSULE STUDY;  Surgeon: PBeryle Beams MD;  [place] [facility]  Service: Endoscopy;  Laterality: N/A;  . Cardioversion N/A 12/09/2013    Procedure: CARDIOVERSION;  Surgeon: MSanda Klein MD;  Location: [facility];  Service: Cardiovascular;  Laterality: N/A;  . Left and right heart catheterization with coronary angiogram N/A 02/13/2011    Procedure: LEFT AND RIGHT HEART CATHETERIZATION WITH CORONARY ANGIOGRAM;  Surgeon: TTroy Sine MD;  [facility];  Service: Cardiovascular;  Laterality: N/A;  . Permanent pacemaker generator change N/A 05/10/2013    Procedure: PERMANENT PACEMAKER GENERATOR CHANGE;  Surgeon: MSanda Klein MD;  [facility];  Service: Cardiovascular;  Laterality:  N/A;    History   Social History  . Marital Status: Married    Spouse Name: [name]  . Number of Children: 1  . Years of Education: 6   Occupational History  . Not on file.   Social History Main Topics  . Smoking status: Former Smoker    Types: Cigarettes    Quit date: 02/11/1957  . Smokeless tobacco: Never Used  . Alcohol Use: No  . Drug Use: No  . Sexual Activity: Not on file   Other Topics Concern  . Not on file   Social History Narrative   Patient is married ([name]).   Patient has one child.   Patient does not drink caffeine.   Patient is right-handed.   Patient is retired.   Patient has a 6th grade education.          Family History  Problem Relation Age of Onset  . Hypertension  Mother   . Kidney disease Brother     ROS:  Weakness, black stools from iron but no fevers or chills, productive cough, hemoptysis, dysphasia, odynophagia,  hematochezia, dysuria, hematuria, rash, seizure activity, orthopnea, PND, pedal edema, claudication. Remaining systems are negative.  Physical Exam:   Blood pressure 115/51, pulse 71, temperature 98.8 F (37.1 C), temperature source Oral, resp. rate 16, height _0  (1.702 m), weight 157 lb (71.215 kg), SpO2 96 %.  General:  Well developed/frail in NAD Skin warm/dry, ecchymoses Patient not depressed No peripheral clubbing Back-normal HEENT-normal/normal eyelids Neck supple/normal carotid upstroke bilaterally; no bruits; no JVD; no thyromegaly chest - CTA/ normal expansion CV - RRR/normal S1 and S2; no rubs or gallops;  PMI nondisplaced, 3/6 systolic murmur apex; 2/6 systolic murmur LSB Abdomen -NT/ND, no HSM, no mass, + bowel sounds, no bruit 2+ femoral pulses, no bruits Ext-no edema, chords; diminished distal pulses Neuro-grossly nonfocal  ECG sinus with ventricular paced rhythm  Results for orders placed or performed during the hospital encounter of 03/24/14 (from the past 48 hour(s))  CBC with Differential     Status:  Abnormal   Collection Time: 03/24/14  9:05 AM  Result Value Ref Range   WBC 4.2 4.0 - 10.5 K/uL   RBC 2.45 (L) 4.22 - 5.81 MIL/uL   Hemoglobin 7.2 (L) 13.0 - 17.0 g/dL   HCT 22.0 (L) 39.0 - 52.0 %   MCV 89.8 78.0 - 100.0 fL   MCH 29.4 26.0 - 34.0 pg   MCHC 32.7 30.0 - 36.0 g/dL   RDW 18.2 (H) 11.5 - 15.5 %   Platelets 106 (L) 150 - 400 K/uL    Comment: SPECIMEN CHECKED FOR CLOTS REPEATED TO VERIFY PLATELET COUNT CONFIRMED BY SMEAR    Neutrophils Relative % 78 (H) 43 - 77 %   Neutro Abs 3.3 1.7 - 7.7 K/uL   Lymphocytes Relative 13 12 - 46 %   Lymphs Abs 0.6 (L) 0.7 - 4.0 K/uL   Monocytes Relative 7 3 - 12 %   Monocytes Absolute 0.3 0.1 - 1.0 K/uL   Eosinophils Relative 2 0 - 5 %   Eosinophils Absolute 0.1 0.0 - 0.7 K/uL   Basophils Relative 0 0 - 1 %   Basophils Absolute 0.0 0.0 - 0.1 K/uL  Basic metabolic panel     Status: Abnormal   Collection Time: 03/24/14  9:05 AM  Result Value Ref Range   Sodium 137 135 - 145 mmol/L   Potassium 2.5 (LL) 3.5 - 5.1 mmol/L    Comment: REPEATED TO VERIFY CRITICAL RESULT CALLED TO, READ BACK BY AND VERIFIED WITH: K.COBB,RN 1019 03/24/14 CLARK,S    Chloride 88 (L) 96 - 112 mmol/L   CO2 31 19 - 32 mmol/L   Glucose, Bld 103 (H) 70 - 99 mg/dL   BUN 228 (H) 6 - 23 mg/dL   Creatinine, Ser 4.55 (H) 0.50 - 1.35 mg/dL   Calcium 9.3 8.4 - 10.5 mg/dL   GFR calc non Af Amer 11 (L) >90 mL/min   GFR calc Af Amer 12 (L) >90 mL/min    Comment: (NOTE) The eGFR has been calculated using the CKD EPI equation. This calculation has not been validated in all clinical situations. eGFR's persistently <90 mL/min signify possible Chronic Kidney Disease.    Anion gap 18 (H) 5 - 15  APTT     Status: Abnormal   Collection Time: 03/24/14  9:10 AM  Result Value Ref Range   aPTT 42 (  H) 24 - 37 seconds    Comment:        IF BASELINE aPTT IS ELEVATED, SUGGEST PATIENT RISK ASSESSMENT BE USED TO DETERMINE APPROPRIATE ANTICOAGULANT THERAPY.   Protime-INR      Status: Abnormal   Collection Time: 03/24/14  9:10 AM  Result Value Ref Range   Prothrombin Time 21.8 (H) 11.6 - 15.2 seconds   INR 1.88 (H) 0.00 - 1.49  I-stat troponin, ED     Status: None   Collection Time: 03/24/14  9:12 AM  Result Value Ref Range   Troponin i, poc 0.08 0.00 - 0.08 ng/mL   Comment NOTIFIED PHYSICIAN    Comment 3            Comment: Due to the release kinetics of cTnI, a negative result within the first hours of the onset of symptoms does not rule out myocardial infarction with certainty. If myocardial infarction is still suspected, repeat the test at appropriate intervals.   Type and screen     Status: None   Collection Time: 03/24/14 10:10 AM  Result Value Ref Range   ABO/RH(D) A POS    Antibody Screen NEG    Sample Expiration 03/27/2014    Unit Number [id number]    Blood Component Type RED CELLS,LR    Unit division 00    Status of Unit ISSUED,FINAL    Transfusion Status OK TO TRANSFUSE    Crossmatch Result Compatible    Unit Number [id number]    Blood Component Type RED CELLS,LR    Unit division 00    Status of Unit ISSUED,FINAL    Transfusion Status OK TO TRANSFUSE    Crossmatch Result Compatible   Prepare RBC     Status: None   Collection Time: 03/24/14 10:10 AM  Result Value Ref Range   Order Confirmation ORDER PROCESSED BY BLOOD BANK   POC occult blood, ED Provider will collect     Status: Abnormal   Collection Time: 03/24/14 10:13 AM  Result Value Ref Range   Fecal Occult Bld POSITIVE (A) NEGATIVE  Troponin I (q 6hr x 3)     Status: Abnormal   Collection Time: 03/24/14  3:15 PM  Result Value Ref Range   Troponin I 0.11 (H) <0.031 ng/mL    Comment:        PERSISTENTLY INCREASED TROPONIN VALUES IN THE RANGE OF 0.04-0.49 ng/mL CAN BE SEEN IN:       -UNSTABLE ANGINA       -CONGESTIVE HEART FAILURE       -MYOCARDITIS       -CHEST TRAUMA       -ARRYHTHMIAS       -LATE PRESENTING MYOCARDIAL INFARCTION       -COPD   CLINICAL  FOLLOW-UP RECOMMENDED.   Troponin I (q 6hr x 3)     Status: Abnormal   Collection Time: 03/24/14  9:15 PM  Result Value Ref Range   Troponin I 0.10 (H) <0.031 ng/mL    Comment:        PERSISTENTLY INCREASED TROPONIN VALUES IN THE RANGE OF 0.04-0.49 ng/mL CAN BE SEEN IN:       -UNSTABLE ANGINA       -CONGESTIVE HEART FAILURE       -MYOCARDITIS       -CHEST TRAUMA       -ARRYHTHMIAS       -LATE PRESENTING MYOCARDIAL INFARCTION       -COPD   CLINICAL FOLLOW-UP RECOMMENDED.   Troponin  I (q 6hr x 3)     Status: Abnormal   Collection Time: 03/25/14  3:10 AM  Result Value Ref Range   Troponin I 0.12 (H) <0.031 ng/mL    Comment:        PERSISTENTLY INCREASED TROPONIN VALUES IN THE RANGE OF 0.04-0.49 ng/mL CAN BE SEEN IN:       -UNSTABLE ANGINA       -CONGESTIVE HEART FAILURE       -MYOCARDITIS       -CHEST TRAUMA       -ARRYHTHMIAS       -LATE PRESENTING MYOCARDIAL INFARCTION       -COPD   CLINICAL FOLLOW-UP RECOMMENDED.   Basic metabolic panel     Status: Abnormal   Collection Time: 03/25/14  9:30 AM  Result Value Ref Range   Sodium 142 135 - 145 mmol/L   Potassium 3.2 (L) 3.5 - 5.1 mmol/L    Comment: DELTA CHECK NOTED   Chloride 96 96 - 112 mmol/L    Comment: DELTA CHECK NOTED   CO2 29 19 - 32 mmol/L   Glucose, Bld 99 70 - 99 mg/dL   BUN 204 (H) 6 - 23 mg/dL   Creatinine, Ser 4.33 (H) 0.50 - 1.35 mg/dL   Calcium 9.3 8.4 - 10.5 mg/dL   GFR calc non Af Amer 11 (L) >90 mL/min   GFR calc Af Amer 13 (L) >90 mL/min    Comment: (NOTE) The eGFR has been calculated using the CKD EPI equation. This calculation has not been validated in all clinical situations. eGFR's persistently <90 mL/min signify possible Chronic Kidney Disease.    Anion gap 17 (H) 5 - 15  CBC     Status: Abnormal   Collection Time: 03/25/14  9:30 AM  Result Value Ref Range   WBC 5.0 4.0 - 10.5 K/uL   RBC 3.37 (L) 4.22 - 5.81 MIL/uL   Hemoglobin 9.8 (L) 13.0 - 17.0 g/dL    Comment: POST TRANSFUSION  SPECIMEN REPEATED TO VERIFY    HCT 29.9 (L) 39.0 - 52.0 %   MCV 88.7 78.0 - 100.0 fL   MCH 29.1 26.0 - 34.0 pg   MCHC 32.8 30.0 - 36.0 g/dL   RDW 17.9 (H) 11.5 - 15.5 %   Platelets 123 (L) 150 - 400 K/uL  Protime-INR     Status: Abnormal   Collection Time: 03/25/14  9:30 AM  Result Value Ref Range   Prothrombin Time 21.0 (H) 11.6 - 15.2 seconds   INR 1.80 (H) 0.00 - 1.49  Hepatic function panel     Status: Abnormal   Collection Time: 03/25/14  9:30 AM  Result Value Ref Range   Total Protein 7.0 6.0 - 8.3 g/dL   Albumin 3.9 3.5 - 5.2 g/dL   AST 53 (H) 0 - 37 U/L   ALT 26 0 - 53 U/L   Alkaline Phosphatase 59 39 - 117 U/L   Total Bilirubin 1.0 0.3 - 1.2 mg/dL   Bilirubin, Direct 0.3 0.0 - 0.5 mg/dL   Indirect Bilirubin 0.7 0.3 - 0.9 mg/dL    Dg Chest 2 View  03/24/2014   CLINICAL DATA:  Chest pain and cough, 2 days duration.  EXAM: CHEST  2 VIEW  COMPARISON:  12/22/2013.  06/21/2013.  Multiple previous.  FINDINGS: There is been previous median sternotomy and aortic valve replacement. Dual lead pacemaker remains in place. Cardiomegaly is again seen. The aorta shows atherosclerosis an unfolding. Pulmonary edema seen on  the previous study is improved, but there is probably minimal residual interstitial edema. No effusions. No consolidation or collapse. Small nodular shadows on each side of the chest may relate to the edema pattern but should be observed on subsequent imaging. No acute bony finding.  IMPRESSION: Previous CABG, AVR and pacemaker. Cardiomegaly. Aortic atherosclerosis. Mild interstitial edema. Edema pattern is improved compared to the study of December 2015.  Small pulmonary nodular shadows bilaterally, most likely relating to the edema pattern. Follow-up on subsequent exams.   Electronically Signed   By: Nelson Chimes M.D.   On: 03/24/2014 09:32    Assessment/Plan 1 chest pain-symptoms are extremely atypical. Troponin not diagnostic of acute coronary syndrome. Would not pursue  further cardiac evaluation. 2 history of atrial fibrillation-patient appears to be in sinus rhythm. Continue amiodarone and metoprolol. CHADS vasc 5. He would benefit from continuing Coumadin. However he has had multiple transfusions in the past and there is a question of GI blood loss. I feel the risk outweighs the benefit at this point. Discontinue Coumadin. He can follow-up with Dr. Sallyanne Kuster following discharge for further discussion about this issue. I would favor discontinuing long-term. They understand the high risk of CVA off of anticoagulation but I think there is no other choice at this point. 3 acute renal failure-patient's BUN and creatinine markedly elevated. Would hold on diuresis. Some of his elevated BUN may be related to GI blood loss. Further evaluation per primary care. 4 acute on chronic anemia-patient is being evaluated by gastroenterology with capsule endoscopy. Further evaluation per primary care. 5 preop pacemaker placement  Kirk Ruths MD 03/25/2014, 1:37 PM

## 2014-03-26 DIAGNOSIS — R079 Chest pain, unspecified: Secondary | ICD-10-CM | POA: Diagnosis not present

## 2014-03-26 DIAGNOSIS — R072 Precordial pain: Secondary | ICD-10-CM | POA: Diagnosis not present

## 2014-03-26 DIAGNOSIS — D500 Iron deficiency anemia secondary to blood loss (chronic): Secondary | ICD-10-CM | POA: Diagnosis not present

## 2014-03-26 DIAGNOSIS — D649 Anemia, unspecified: Secondary | ICD-10-CM | POA: Insufficient documentation

## 2014-03-26 DIAGNOSIS — I209 Angina pectoris, unspecified: Secondary | ICD-10-CM | POA: Diagnosis not present

## 2014-03-26 DIAGNOSIS — N185 Chronic kidney disease, stage 5: Secondary | ICD-10-CM | POA: Diagnosis not present

## 2014-03-26 LAB — PROTIME-INR
INR: 1.83 — ABNORMAL HIGH (ref 0.00–1.49)
Prothrombin Time: 21.4 seconds — ABNORMAL HIGH (ref 11.6–15.2)

## 2014-03-26 LAB — BASIC METABOLIC PANEL
ANION GAP: 12 (ref 5–15)
BUN: 187 mg/dL — ABNORMAL HIGH (ref 6–23)
CO2: 30 mmol/L (ref 19–32)
Calcium: 9.2 mg/dL (ref 8.4–10.5)
Chloride: 97 mmol/L (ref 96–112)
Creatinine, Ser: 4.08 mg/dL — ABNORMAL HIGH (ref 0.50–1.35)
GFR calc Af Amer: 14 mL/min — ABNORMAL LOW (ref >90)
GFR, EST NON AFRICAN AMERICAN: 12 mL/min — AB (ref >90)
Glucose, Bld: 104 mg/dL — ABNORMAL HIGH (ref 70–99)
Potassium: 3 mmol/L — ABNORMAL LOW (ref 3.5–5.1)
SODIUM: 139 mmol/L (ref 135–145)

## 2014-03-26 LAB — CBC
HEMATOCRIT: 26.7 % — AB (ref 39.0–52.0)
HEMOGLOBIN: 8.7 g/dL — AB (ref 13.0–17.0)
MCH: 29.1 pg (ref 26.0–34.0)
MCHC: 32.6 g/dL (ref 30.0–36.0)
MCV: 89.3 fL (ref 78.0–100.0)
Platelets: 112 10*3/uL — ABNORMAL LOW (ref 150–400)
RBC: 2.99 MIL/uL — ABNORMAL LOW (ref 4.22–5.81)
RDW: 17.8 % — ABNORMAL HIGH (ref 11.5–15.5)
WBC: 4.5 10*3/uL (ref 4.0–10.5)

## 2014-03-26 MED ORDER — FUROSEMIDE 40 MG PO TABS: ORAL_TABLET | ORAL | Status: DC

## 2014-03-26 MED ORDER — POTASSIUM CHLORIDE CRYS ER 20 MEQ PO TBCR: 20.0000 meq | EXTENDED_RELEASE_TABLET | Freq: Once | ORAL | Status: DC

## 2014-03-26 MED ORDER — POLYSACCHARIDE IRON COMPLEX 150 MG PO CAPS: 150.0000 mg | ORAL_CAPSULE | Freq: Every day | ORAL | Status: DC

## 2014-03-26 MED ORDER — POLYSACCHARIDE IRON COMPLEX 150 MG PO CAPS
150.0000 mg | ORAL_CAPSULE | Freq: Every day | ORAL | Status: DC
  Filled 2014-03-26: qty 1

## 2014-03-26 MED ORDER — ROPINIROLE HCL 2 MG PO TABS: 2.0000 mg | ORAL_TABLET | Freq: Two times a day (BID) | ORAL | Status: DC

## 2014-03-26 MED ORDER — PANTOPRAZOLE SODIUM 40 MG PO TBEC: 40.0000 mg | DELAYED_RELEASE_TABLET | Freq: Two times a day (BID) | ORAL | Status: AC

## 2014-03-26 NOTE — Progress Notes (Signed)
    Subjective:  Denies CP or dyspnea   Objective:  Filed Vitals:   03/25/14 0827 03/25/14 1318 03/25/14 2025 03/26/14 0458  BP:  115/51 119/59 130/69  Pulse:  71 73 80  Temp:  98.8 F (37.1 C) 98.1 F (36.7 C) 98.4 F (36.9 C)  TempSrc:  Oral Oral Oral  Resp:  16 17 17   Height: 5\' 7"  (1.702 m)     Weight: 157 lb (71.215 kg)   155 lb 4.8 oz (70.444 kg)  SpO2:  96% 97% 96%    Intake/Output from previous day:  Intake/Output Summary (Last 24 hours) at 03/26/14 0733 Last data filed at 03/26/14 [id number]  Gross per 24 hour  Intake    120 ml  Output   2350 ml  Net  -2230 ml    Physical Exam: Physical exam: Well-developed well-nourished in no acute distress.  Skin is warm and dry.  HEENT is normal.  Neck is supple.  Chest is clear to auscultation with normal expansion.  Cardiovascular exam is regular rate and rhythm. 3/6 systolic murmur LSB Abdominal exam nontender or distended. No masses palpated. Extremities show no edema. neuro grossly intact    Lab Results: Basic Metabolic Panel:  Recent Labs  54/09/8101/05/16 0930 03/26/14 0429  NA 142 139  K 3.2* 3.0*  CL 96 97  CO2 29 30  GLUCOSE 99 104*  BUN 204* 187*  CREATININE 4.33* 4.08*  CALCIUM 9.3 9.2   CBC:  Recent Labs  03/24/14 0905 03/25/14 0930 03/26/14 0429  WBC 4.2 5.0 4.5  NEUTROABS 3.3  --   --   HGB 7.2* 9.8* 8.7*  HCT 22.0* 29.9* 26.7*  MCV 89.8 88.7 89.3  PLT 106* 123* 112*   Cardiac Enzymes:  Recent Labs  03/24/14 1515 03/24/14 2115 03/25/14 0310  TROPONINI 0.11* 0.10* 0.12*     Assessment/Plan:  1 chest pain-symptoms are extremely atypical. Troponin not diagnostic of acute coronary syndrome. Would not pursue further cardiac evaluation. 2 history of atrial fibrillation-patient remains in sinus rhythm. Continue amiodarone and metoprolol. CHADS vasc 5. He would benefit from continuing Coumadin. However he has had multiple transfusions in the past and there is a question of GI blood loss. I  feel the risk outweighs the benefit at this point. Discontinue Coumadin. He can follow-up with Dr. Royann Shiversroitoru following discharge for further discussion about this issue. I would favor discontinuing long-term. Patient understands the higher risk of CVA off of anticoagulation but I think there is no other choice at this point. 3 acute renal failure-patient's BUN and creatinine markedly elevated. Would continue to hold on diuresis. Some of his elevated BUN may be related to GI blood loss. Further evaluation per primary care. 4 acute on chronic anemia-patient is being evaluated by gastroenterology with capsule endoscopy. Further evaluation per primary care. 5 preop pacemaker placement 6 Possible abnormalities on chest xray and renal ultrasound-per IM  Nathaniel Steele 03/26/2014, 7:33 AM

## 2014-03-26 NOTE — Progress Notes (Signed)
Progress Note   Subjective  *No further complaints of chest pain.  Hassell study complete  Objective  Vital signs in last 24 hours: Temp:  [98.1 F (36.7 C)-98.8 F (37.1 C)] 98.4 F (36.9 C) (03/06 0458) Pulse Rate:  [71-80] 80 (03/06 0458) Resp:  [16-17] 17 (03/06 0458) BP: (115-130)/(51-69) 130/69 mmHg (03/06 0458) SpO2:  [96 %-97 %] 96 % (03/06 0458) Weight:  [155 lb 4.8 oz (70.444 kg)] 155 lb 4.8 oz (70.444 kg) (03/06 0458) Last BM Date: 03/25/14 General:   Alert,  Well-developed,   in NAD Heart:  Regular rate and rhythm; no murmurs Abdomen:  Soft, nontender and nondistended. Normal bowel sounds, without guarding, and without rebound.   Extremities:  Without edema. Neurologic:  Alert and  oriented x4;  grossly normal neurologically. Psych:  Alert and cooperative. Normal mood and affect.  Intake/Output from previous day: 03/05 0701 - 03/06 0700 In: 120 [P.O.:120] Out: 2550 [Urine:2550] Intake/Output this shift:    Lab Results:  Recent Labs  03/24/14 0905 03/25/14 0930 03/26/14 0429  WBC 4.2 5.0 4.5  HGB 7.2* 9.8* 8.7*  HCT 22.0* 29.9* 26.7*  PLT 106* 123* 112*   BMET  Recent Labs  03/24/14 0905 03/25/14 0930 03/26/14 0429  NA 137 142 139  K 2.5* 3.2* 3.0*  CL 88* 96 97  CO2 GLUCOSE 103* 99 104*  BUN 228* 204* 187*  CREATININE 4.55* 4.33* 4.08*  CALCIUM 9.3 9.3 9.2   LFT  Recent Labs  03/25/14 0930  PROT 7.0  ALBUMIN 3.9  AST 53*  ALT 26  ALKPHOS 59  BILITOT 1.0  BILIDIR 0.3  IBILI 0.7   PT/INR  Recent Labs  03/25/14 0930 03/26/14 0429  LABPROT 21.0* 21.4*  INR 1.80* 1.83*   Hepatitis Panel No results for input(s): HEPBSAG, HCVAB, HEPAIGM, HEPBIGM in the last 72 hours.  Studies/Results: US Renal  03/25/2014   CLINICAL DATA:  Initial evaluation for renal failure congestive heart failure chronic renal disease, lupus kidney disease  EXAM: RENAL/URINARY TRACT ULTRASOUND COMPLETE  COMPARISON:  None.  FINDINGS: Right  Kidney:  Length: 8.9 cm. Increased echogenicity with no hydronephrosis. 3.5 cm upper pole cyst. 3.6 cm lower pole cyst. 2.0 cm cystic lesion midpole with thick internal septations.  Left Kidney:  Length: 5.2 cm. Extremely limited evaluation due to overlying bowel contents.  Bladder:  Appears normal for degree of bladder distention.  IMPRESSION: Evidence of medical renal disease based on renal atrophy and echogenicity.  Extremely limited evaluation of the left kidney. Size measurement is uncertain. No detail of the left kidney available.  Complex cystic lesion midpole right kidney. A hyper attenuating cyst of similar size is seen in this area on September 18, 2008 CT scan. This lesion is likely a complicated cyst but would merit sonographic followup given the appearance.   Electronically Signed   By: Esperanza Heir M.D.   On: 03/25/2014 20:48      Assessment & Plan  1.  *Chronic GI blood loss.  Study will be reviewed by Dr. Elnoria Howard.  Okay to discharge on iron supplementation  2.  Chest pain-probably demand ischemia  Await results of capsule endoscopy Principal Problem:   Chest pain Active Problems:   S/P CABG x 2: (LIMA-LAD  SVG - RCA)   PAF (paroxysmal atrial fibrillation)   Pacemaker, MDT implanted 08/06, generator change April 2015   Chronic anticoagulation, on Coumadin prior to admission   Symptomatic anemia   CKD (  chronic kidney disease) stage 5, GFR less than 15 ml/min   ARF (acute renal failure)   Anemia     LOS: 2 days   Nathaniel HeapsRobert Taiwo Steele  03/26/2014, 11:35 AM

## 2014-03-26 NOTE — Progress Notes (Signed)
Discharged to home with family office visits in place teaching done  

## 2014-03-26 NOTE — Discharge Summary (Addendum)
Physician Discharge Summary  SHIGEO BAUGH AST:[id number] DOB: Nov 09, 1926 DOA: 03/24/2014  PCP: Stephens Shire, MD  Admit date: 03/24/2014 Discharge date: 03/26/2014  Time spent: 35 minutes  Recommendations for Outpatient Follow-up:  follow up with Dr Benson Norway for results of capsule endoscopy.  Need cbc to follow up hb level.  follow up with Dr Lorrene Reid, nephrologist, needs B-met. Consider resuming lasix.  Follow up with Dr Sallyanne Kuster to discussed further regarding resuming coumadin.  Need chest x ray to follow lung nodule.   Discharge Diagnoses:    Symptomatic anemia, presume to GI bleed.    Chest pain, demand ischemia.    S/P CABG x 2: (LIMA-LAD  SVG - RCA)   PAF (paroxysmal atrial fibrillation)   Pacemaker, MDT implanted 08/06, generator change April 2015   Chronic anticoagulation, on Coumadin prior to admission      Acute on CKD (chronic kidney disease) stage 5, GFR less than 15 ml/min   Discharge Condition: Stable.   Diet recommendation: Heart Healthy  Filed Weights   03/25/14 0537 03/25/14 0827 03/26/14 0458  Weight: 71.6 kg (157 lb 13.6 oz) 71.215 kg (157 lb) 70.444 kg (155 lb 4.8 oz)    History of present illness:  81 year old man with history of coronary artery disease, CABG, chronic from cytopenia and chronic anemia presented to the emergency department with three-day history of chest pain, relieved with aspirin and nitroglycerin. Initial evaluation revealed acute on chronic anemia, markedly elevated BUN, acute renal failure. Troponin was within normal limits, EKG nonacute and patient was chest pain-free. He was referred for further evaluation of symptomatic anemia, chest pain and plans were made for transfusion in the emergency department.  Patient reports 3 day history of intermittent chest pain lasting one or 2 minutes, described as a pressure-like sensation with some radiation to his left arm. Some associated shortness of breath with this. No diaphoresis. Some nausea and one or  2 episodes of vomiting. He has a history of anemia and takes iron for this so his stools chronically dark. He has noticed no bleeding. He does take injections for anemia per his nephrologist.  Discharge 2/27 after being evaluated and treated for severe/symptomatic anemia with hemoglobin of 5.2 on admission, transfused 4 units packed red blood cells. No evidence of GI bleed according to notes. Etiology of anemia not clear.   Hospital Course:  1-Chest pain, mild elevated troponin: probably demand ischemia in setting of anemia.  Resolved after blood transfusion. Pending hb for this morning.  ECHO. Normal Ef.  Cardio consulted.  Resume metoprolol, holder parameter for hypotension.   2-Acute on chronic Anemia, presume chronic GI bleed:  S/P 2 units of PRBC.  HB stable. .  Had capsule endoscopy. Results pending.  Continue to hold coumadin. INR pending. Cardiology consulted for recommendation regarding long term coumadin.  Continue to hold Coumadin for now, patient to discussed with Dr Tivis Ringer resuming this medication.  Discharge on iron supplement.  Patient feeling well, wants to go home/  \ 3-Possible GI bleed, suspect subacute/chronic. Capsule endoscopy results pending. . See problem 2.  Continue with PPI.   History of sick sinus syndrome, status post pacemaker placement; according to chart paroxysmal atrial fibrillation ablation on amiodarone  -hold coumadin.   Chronic kidney disease, lupus nephritis with acute renal failure superimposed with markedly elevated BUN. CO2 normal, 31. Cr baseline 3 to 2.6.  BUN, Cr on 2-26 was at 204/4.9.  Renal function stable, good urine out put, 2 L on (3-5). Hold lasix at discharge.  Discussed renal US results with Dr Dorothy Spark, probably chronic. Follow up with nephrologist outpatient.   History of CABG, Aortic Valve replacement pericardial tissue;   Chronic thrombocytopenia, etiology unclear. Stable.   Hypokalemia: replaced.     Procedures: Capsule endoscopy  Consultations:  Cardiology  GI  Discharge Exam: Filed Vitals:   03/26/14 0458  BP: 130/69  Pulse: 80  Temp: 98.4 F (36.9 C)  Resp: 17    General: Alert in no distress.  Cardiovascular: S 1, S 2 RRR Respiratory: CTA  Discharge Instructions   Discharge Instructions    Diet - low sodium heart healthy    Complete by:  As directed      Increase activity slowly    Complete by:  As directed           Current Discharge Medication List    CONTINUE these medications which have CHANGED   Details  rOPINIRole (REQUIP) 2 MG tablet Take 1 tablet (2 mg total) by mouth 2 (two) times daily. Qty: 30 tablet, Refills: 0      CONTINUE these medications which have NOT CHANGED   Details  amiodarone (PACERONE) 200 MG tablet Take 1 tablet (200 mg total) by mouth daily. Qty: 60 tablet, Refills: 11    calcitRIOL (ROCALTROL) 0.25 MCG capsule Take 0.25 mcg by mouth daily before breakfast.     calcium acetate (PHOSLO) 667 MG capsule Take 667 mg by mouth every morning.     Cyanocobalamin (VITAMIN B 12 PO) Take 1 tablet by mouth daily.    cycloSPORINE (RESTASIS) 0.05 % ophthalmic emulsion Place 1 drop into both eyes 2 (two) times daily.     finasteride (PROSCAR) 5 MG tablet Take 5 mg by mouth daily.    fish oil-omega-3 fatty acids 1000 MG capsule Take 1 g by mouth daily.     metoprolol (LOPRESSOR) 50 MG tablet Take 50 mg by mouth daily before breakfast.     Multiple Vitamins-Minerals (OCUVITE PRESERVISION PO) Take 1 tablet by mouth daily.    pantoprazole (PROTONIX) 40 MG tablet Take 1 tablet (40 mg total) by mouth 2 (two) times daily. Qty: 60 tablet, Refills: 0      STOP taking these medications     aspirin EC 81 MG tablet      warfarin (COUMADIN) 5 MG tablet      furosemide (LASIX) 40 MG tablet        Allergies  Allergen Reactions  . Statins Other (See Comments)    myalgias  . Aspirin Hives    Patient states that he can take  the enteric coated but not the uncoated  . Feraheme [Ferumoxytol] Rash and Other (See Comments)    Abdominal pain  . Penicillins Hives   Follow-up Information    Follow up with BURNETT,BRENT A, MD In 1 week.   Specialty:  Family Medicine   Contact information:   [address] [place] [address] [phone]       Follow up with CROITORU,MIHAI, MD In 1 week.   Specialty:  Cardiology   Contact information:   [address] [place] [address] [phone]       Follow up with Beryle Beams, MD In 3 days.   Specialty:  Gastroenterology   Contact information:   [address] [place] [address] [phone]        The results of significant diagnostics from this hospitalization (including imaging, microbiology, ancillary and laboratory) are listed below for reference.  Significant Diagnostic Studies: Dg Chest 2 View  03/24/2014   CLINICAL DATA:  Chest pain and cough, 2 days duration.  EXAM: CHEST  2 VIEW  COMPARISON:  12/22/2013.  06/21/2013.  Multiple previous.  FINDINGS: There is been previous median sternotomy and aortic valve replacement. Dual lead pacemaker remains in place. Cardiomegaly is again seen. The aorta shows atherosclerosis an unfolding. Pulmonary edema seen on the previous study is improved, but there is probably minimal residual interstitial edema. No effusions. No consolidation or collapse. Small nodular shadows on each side of the chest may relate to the edema pattern but should be observed on subsequent imaging. No acute bony finding.  IMPRESSION: Previous CABG, AVR and pacemaker. Cardiomegaly. Aortic atherosclerosis. Mild interstitial edema. Edema pattern is improved compared to the study of December 2015.  Small pulmonary nodular shadows bilaterally, most likely relating to the edema pattern. Follow-up on subsequent exams.   Electronically Signed   By: Nelson Chimes M.D.   On: 03/24/2014 09:32   US  Renal  03/25/2014   CLINICAL DATA:  Initial evaluation for renal failure congestive heart failure chronic renal disease, lupus kidney disease  EXAM: RENAL/URINARY TRACT ULTRASOUND COMPLETE  COMPARISON:  None.  FINDINGS: Right Kidney:  Length: 8.9 cm. Increased echogenicity with no hydronephrosis. 3.5 cm upper pole cyst. 3.6 cm lower pole cyst. 2.0 cm cystic lesion midpole with thick internal septations.  Left Kidney:  Length: 5.2 cm. Extremely limited evaluation due to overlying bowel contents.  Bladder:  Appears normal for degree of bladder distention.  IMPRESSION: Evidence of medical renal disease based on renal atrophy and echogenicity.  Extremely limited evaluation of the left kidney. Size measurement is uncertain. No detail of the left kidney available.  Complex cystic lesion midpole right kidney. A hyper attenuating cyst of similar size is seen in this area on September 18, 2008 CT scan. This lesion is likely a complicated cyst but would merit sonographic followup given the appearance.   Electronically Signed   By: Skipper Cliche M.D.   On: 03/25/2014 20:48    Microbiology: No results found for this or any previous visit (from the past 240 hour(s)).   Labs: Basic Metabolic Panel:  Recent Labs Lab 03/24/14 0905 03/25/14 0930 03/26/14 0429  NA 137 142 139  K 2.5* 3.2* 3.0*  CL 88* 96 97  CO2 $Re'31 29 30  'HBU$ GLUCOSE 103* 99 104*  BUN 228* 204* 187*  CREATININE 4.55* 4.33* 4.08*  CALCIUM 9.3 9.3 9.2   Liver Function Tests:  Recent Labs Lab 03/25/14 0930  AST 53*  ALT 26  ALKPHOS 59  BILITOT 1.0  PROT 7.0  ALBUMIN 3.9   No results for input(s): LIPASE, AMYLASE in the last 168 hours. No results for input(s): AMMONIA in the last 168 hours. CBC:  Recent Labs Lab 03/24/14 0905 03/25/14 0930 03/26/14 0429  WBC 4.2 5.0 4.5  NEUTROABS 3.3  --   --   HGB 7.2* 9.8* 8.7*  HCT 22.0* 29.9* 26.7*  MCV 89.8 88.7 89.3  PLT 106* 123* 112*   Cardiac Enzymes:  Recent Labs Lab  03/24/14 1515 03/24/14 2115 03/25/14 0310  TROPONINI 0.11* 0.10* 0.12*   BNP: BNP (last 3 results) No results for input(s): BNP in the last 8760 hours.  ProBNP (last 3 results)  Recent Labs  12/22/13 1850  PROBNP 16536.0*    CBG:  Recent Labs Lab 03/25/14 2128  GLUCAP 142*       Signed:  Regalado, Belkys A  [facility]  03/26/2014, 11:25 AM

## 2014-03-26 NOTE — Progress Notes (Signed)
Utilization review completed.  P.J. Amaal Dimartino,RN,BSN Case Manager 

## 2014-03-27 ENCOUNTER — Encounter (HOSPITAL_COMMUNITY): Payer: Self-pay | Admitting: Gastroenterology

## 2014-03-28 ENCOUNTER — Encounter (HOSPITAL_COMMUNITY)
Admission: RE | Admit: 2014-03-28 | Discharge: 2014-03-28 | Disposition: A | Discharge status: Home / Self Care | Payer: Medicare Other | Source: Ambulatory Visit | Attending: Nephrology | Admitting: Nephrology

## 2014-03-28 DIAGNOSIS — N184 Chronic kidney disease, stage 4 (severe): Secondary | ICD-10-CM | POA: Insufficient documentation

## 2014-03-28 DIAGNOSIS — D638 Anemia in other chronic diseases classified elsewhere: Secondary | ICD-10-CM | POA: Insufficient documentation

## 2014-03-28 LAB — HEMOGLOBIN AND HEMATOCRIT, BLOOD
HCT: 29.1 % — ABNORMAL LOW (ref 39.0–52.0)
Hemoglobin: 9.1 g/dL — ABNORMAL LOW (ref 13.0–17.0)

## 2014-03-28 LAB — IRON AND TIBC
Iron: 66 ug/dL (ref 42–165)
Saturation Ratios: 20 % (ref 20–55)
TIBC: 322 ug/dL (ref 215–435)
UIBC: 256 ug/dL (ref 125–400)

## 2014-03-28 LAB — FERRITIN: Ferritin: 354 ng/mL — ABNORMAL HIGH (ref 22–322)

## 2014-03-28 MED ORDER — DARBEPOETIN ALFA 100 MCG/0.5ML IJ SOSY
200.0000 ug | PREFILLED_SYRINGE | Freq: Once | INTRAMUSCULAR | Status: AC
  Administered 2014-03-28: 200 ug via SUBCUTANEOUS
  Filled 2014-03-28: qty 1

## 2014-03-28 NOTE — Progress Notes (Addendum)
Results for [name], Nathaniel C (MRN [id number]) as of 03/28/2014 14:07  Ref. Range 03/28/2014 09:29  Hemoglobin Latest Range: 13.0-17.0 g/dL 9.1 (L)  HCT Latest Range: 39.0-52.0 % 29.1 (L)  Pt. To [facility] received Aranesp 200 mcg SQ tolerated well

## 2014-03-31 ENCOUNTER — Telehealth (HOSPITAL_COMMUNITY): Payer: Self-pay | Admitting: Cardiology

## 2014-03-31 NOTE — Telephone Encounter (Signed)
Received records from [facility] for appointment with Boyce MediciBrittany Simmons, PA on 04/12/14.  Records given to [employer] (medical records) for Brittany's schedule on 04/12/14.  lp

## 2014-04-11 ENCOUNTER — Telehealth (HOSPITAL_COMMUNITY): Payer: Self-pay | Admitting: Cardiovascular Disease

## 2014-04-11 ENCOUNTER — Ambulatory Visit (HOSPITAL_COMMUNITY): Payer: Medicare Other

## 2014-04-11 ENCOUNTER — Inpatient Hospital Stay (HOSPITAL_COMMUNITY): Admission: RE | Admit: 2014-04-11 | Payer: Medicare Other | Source: Ambulatory Visit

## 2014-04-11 NOTE — Telephone Encounter (Signed)
[facility] with Dr Royann Shiversroitoru on 04/19/14.  [facility] Dr Croitoru's schedule on 04/19/14.  lp

## 2014-04-12 ENCOUNTER — Ambulatory Visit: Payer: Medicare Other | Admitting: Pharmacist Clinician (PhC)/ Clinical Pharmacy Specialist

## 2014-04-12 ENCOUNTER — Ambulatory Visit: Payer: Medicare Other | Admitting: Cardiology

## 2014-04-19 ENCOUNTER — Ambulatory Visit (INDEPENDENT_AMBULATORY_CARE_PROVIDER_SITE_OTHER): Payer: Medicare Other | Admitting: Cardiovascular Disease

## 2014-04-19 ENCOUNTER — Encounter: Payer: Self-pay | Admitting: Cardiovascular Disease

## 2014-04-19 VITALS — BP 120/65 | HR 75 | Resp 16 | Ht 67.0 in | Wt 162.7 lb

## 2014-04-19 DIAGNOSIS — I4892 Unspecified atrial flutter: Secondary | ICD-10-CM | POA: Diagnosis not present

## 2014-04-19 DIAGNOSIS — Z954 Presence of other heart-valve replacement: Secondary | ICD-10-CM

## 2014-04-19 DIAGNOSIS — R001 Bradycardia, unspecified: Secondary | ICD-10-CM | POA: Diagnosis not present

## 2014-04-19 DIAGNOSIS — Z7901 Long term (current) use of anticoagulants: Secondary | ICD-10-CM

## 2014-04-19 DIAGNOSIS — Z952 Presence of prosthetic heart valve: Secondary | ICD-10-CM

## 2014-04-19 DIAGNOSIS — I480 Paroxysmal atrial fibrillation: Secondary | ICD-10-CM | POA: Diagnosis not present

## 2014-04-19 DIAGNOSIS — I5033 Acute on chronic diastolic (congestive) heart failure: Secondary | ICD-10-CM | POA: Diagnosis not present

## 2014-04-19 DIAGNOSIS — I495 Sick sinus syndrome: Secondary | ICD-10-CM | POA: Diagnosis not present

## 2014-04-19 DIAGNOSIS — Z950 Presence of cardiac pacemaker: Secondary | ICD-10-CM

## 2014-04-19 DIAGNOSIS — Z79899 Other long term (current) drug therapy: Secondary | ICD-10-CM

## 2014-04-19 NOTE — Patient Instructions (Addendum)
DECREASE Metoprolol to 1/2 tablet daily..    Your physician recommends that you return for lab work in: June 2016 at dialysis.   Remote monitoring is used to monitor your pacemaker from home. This monitoring reduces the number of office visits required to check your device to one time per year. It allows us to keep an eye on the functioning of your device to ensure it is working properly. You are scheduled for a device check from home on 07/19/2014. You may send your transmission at any time that day. If you have a wireless device, the transmission will be sent automatically. After your physician reviews your transmission, you will receive a postcard with your next transmission date.  Your physician recommends that you schedule a follow-up appointment in: 6 months with Dr.Croitoru

## 2014-04-19 NOTE — Progress Notes (Signed)
Patient ID: Nathaniel Steele, male   DOB: October 06, 1926, 81 y.o.   MRN: [id number]     Cardiology Office Note   Date:  04/23/2014   ID:  Nathaniel Steele, DOB 18-Jan-1927, MRN [id number]  PCP:  Delorse Lek, MD  Cardiologist:   Thurmon Fair, MD   Chief Complaint  Patient presents with  . Follow-up    has recently started dialysis. MWF      History of Present Illness: Nathaniel Steele is a 81 y.o. male who presents for CAD s/p CABG, pacemaker follow up (SSS), persistent atrial fibrillation and atrial flutter s/p cardioversion, aortic valve bioprosthesis and chronic diastolic heart failure. He started hemodialysis just 3 weeks ago (presumed "burned-out" lupus nephritis). Subsequently, his diuretic and most of his BP meds were stopped. Occasional issues with low BP on dialysis. He is no longer taking warfarin due to anemia and previous GI bleeeding.  As always, Nathaniel Steele is very stoic and relaxed and has no complaints.  Device interrogation shows normal function with 100% AV sequential pacing. Good heart rate histogram. Generator longevity estimated at 7 years.  His pacemaker was initially implanted in 2006 for sinus node dysfunction with symptomatic bradycardia and he underwent a generator change out in April of 2015. Although not technically pacemaker dependent, his underlying rhythm is extremely slow sinus bradycardia at about 30 beats per minute. He also has a very long AV conduction time at almost 700 ms so that he paces the ventricle 100% of the time. Attempts to discontinue amiodarone led to increased burden of atrial fibrillation.  He has a past history of coronary disease and previous two-vessel bypass surgery, aortic valve replacement with a biological prosthesis in 2013. He has hyperlipidemia and hypertension. His last echo in March 2016 showed LVEF 55% and normal bioprosthetic valve function (mean gradient 15 mm Hg).  [name] was hospitalized in April 2015 with acute anemia related to spontaneous  bleeding in his right lower extremity and again hospitalized in June 2015 with anemia, presumably related to gastrointestinal bleeding. Stools were Hemoccult positive but upper endoscopy, capsule enteroscopy were not revealing. He had a remote normal colonoscopy except for some diverticulosis. He required a total of 2 units of PRBC transfusion in April and 6 units of transfusion in June. On both occasions his warfarin anticoagulation was only slightly above target range. As far as I know he has never had a stroke/TIA or other embolic events.  He has a history of neuropathy and restless leg syndrome and had frostbite while in [place] during the war. He has had surgery twice for lumbar spine disease.He gets many of his medications from the [facility] hospital and prefers to have the labs drawn there.   Past Medical History  Diagnosis Date  . Shortness of breath with exertion  . Chronic kidney disease     not on dialysis yet  . Coronary artery disease 05/14/2010    stress test - no scintigraphic evidence of inducible myocardial ischemia,; normal study  . Hypertension   . Arthritis   . Blood dyscrasia     one time had low platlet count  . Dysrhythmia     atrial fib  . SSS (sick sinus syndrome) 08/27/2011    echo EF >55%, severe LAE, moderate RAE, tissue AVR w/ gradients 35 and  . Pericardial effusion 03/12/2011    echo EF 55-60%  . Claudication 02/19/2012    LE doppler no evidence of arterial insufficiency, lower extremities demonstrate normal values  w/ no evidence  of insufficiency  . Lupus nephritis   . Pacemaker 09/19/2004    implanted  . Restless leg syndrome   . Cellulitis   . Anemia   . Benign prostatic hypertrophy   . Constipation   . Shoulder joint pain   . Acute on chronic diastolic CHF (congestive heart failure), NYHA class 3 12/07/2013    Past Surgical History  Procedure Laterality Date  . Appendectomy    . Hernia repair    . Pacemaker insertion  2006    Med[employer] EnRhythm  .  Av fistula placement  02/24/2011  . Aortic valve replacement  02/24/2011    pericardial tissue valve [facility]  . Coronary artery bypass graft  02/24/2011    LIMA to LAD, SVG to distal RCA  . Cardiac catheterization  02/13/2011    mod/severe aortic valve stenosis w peak to peak gradient 25-32 mmHg,  70% stenosis LAD, 30-40%proximal followed by 90% sstenosis in RCA prior to anterior RV margin branch  . Lumbar laminectomy/decompression microdiscectomy Right 04/16/2012    Procedure: DECOMPRESSIVE L4 - L5/ MICRODISCECTOMY ON THE RIGHT 1 LEVEL;  Surgeon: Jacki Conesonald A Gioffre, MD;  Location: [facility];  Service: Orthopedics;  Laterality: Right;  . Esophagogastroduodenoscopy N/A 06/21/2013    Procedure: ESOPHAGOGASTRODUODENOSCOPY (EGD);  Surgeon: Theda BelfastPatrick D Hung, MD;  [facility];  Service: Endoscopy;  Laterality: N/A;  . Givens capsule study N/A 06/21/2013    Procedure: GIVENS CAPSULE STUDY;  Surgeon: Theda BelfastPatrick D Hung, MD;  [facility];  Service: Endoscopy;  Laterality: N/A;  . Cardioversion N/A 12/09/2013    Procedure: CARDIOVERSION;  Surgeon: Thurmon FairMihai Marcellene Shivley, MD;  Location: [facility] ENDOSCOPY;  Service: Cardiovascular;  Laterality: N/A;  . Left and right heart catheterization with coronary angiogram N/A 02/13/2011    Procedure: LEFT AND RIGHT HEART CATHETERIZATION WITH CORONARY ANGIOGRAM;  Surgeon: Lennette Biharihomas A Kelly, MD;  [facility];  Service: Cardiovascular;  Laterality: N/A;  . Permanent pacemaker generator change N/A 05/10/2013    Procedure: PERMANENT PACEMAKER GENERATOR CHANGE;  Surgeon: Thurmon FairMihai Paeton Studer, MD;  Location: [facility] CATH LAB;  Service: Cardiovascular;  Laterality: N/A;  . Givens capsule study N/A 03/25/2014    Procedure: GIVENS CAPSULE STUDY;  Surgeon: Theda BelfastPatrick D Hung, MD;  [facility];  Service: Endoscopy;  Laterality: N/A;     Current Outpatient Prescriptions  Medication Sig Dispense Refill  . amiodarone (PACERONE) 200 MG tablet Take 1 tablet (200 mg total) by mouth  daily. 60 tablet 11  . calcitRIOL (ROCALTROL) 0.25 MCG capsule Take 0.25 mcg by mouth daily before breakfast.     . calcium acetate (PHOSLO) 667 MG capsule Take 667 mg by mouth every morning.     . Cyanocobalamin (VITAMIN B 12 PO) Take 1 tablet by mouth daily.    . cycloSPORINE (RESTASIS) 0.05 % ophthalmic emulsion Place 1 drop into both eyes 2 (two) times daily.     . finasteride (PROSCAR) 5 MG tablet Take 5 mg by mouth daily.    . fish oil-omega-3 fatty acids 1000 MG capsule Take 1 g by mouth daily.     . metoprolol (LOPRESSOR) 50 MG tablet Take 25 mg by mouth daily before breakfast.    . Multiple Vitamins-Minerals (OCUVITE PRESERVISION PO) Take 1 tablet by mouth daily.    . pantoprazole (PROTONIX) 40 MG tablet Take 1 tablet (40 mg total) by mouth 2 (two) times daily. 60 tablet 0  . rOPINIRole (REQUIP) 2 MG tablet Take 1 tablet (2 mg total) by mouth 2 (two)  times daily. 30 tablet 0   No current facility-administered medications for this visit.    Allergies:   Statins; Aspirin; Feraheme; and Penicillins    Social History:  The patient  reports that he quit smoking about 57 years ago. His smoking use included Cigarettes. He has never used smokeless tobacco. He reports that he does not drink alcohol or use illicit drugs.   Family History:  The patient's family history includes Hypertension in his mother; Kidney disease in his brother.    ROS:  Please see the history of present illness.    The patient specifically denies any chest pain at rest exertion, dyspnea at rest or with exertion, orthopnea, paroxysmal nocturnal dyspnea, syncope, palpitations, focal neurological deficits, intermittent claudication, lower extremity edema, unexplained weight gain, cough, hemoptysis or wheezing.    All other systems are reviewed and negative.    PHYSICAL EXAM: VS:  BP 120/65 mmHg  Pulse 75  Resp 16  Ht  (1.702 m)  Wt 162 lb 11.2 oz (73.8 kg)  BMI 25.48 kg/m2 , BMI Body mass index is 25.48  kg/(m^2).  General: Alert, oriented x3, no distress  Head: no evidence of trauma, PERRL, EOMI, no exophtalmos or lid lag, no myxedema, no xanthelasma; normal ears, nose and oropharynx  Neck: normal jugular venous pulsations and no hepatojugular reflux; brisk carotid pulses without delay and faint bilateral carotid bruits  Chest: clear to auscultation, no signs of consolidation by percussion or palpation, normal fremitus, symmetrical and full respiratory excursions; healed sternotomy, healthy right subclavian pacemaker site. Small (4-6 cm diameter) ecchymoses on left lateral chest. No tenderness. Cardiovascular: normal position and quality of the apical impulse, regular rhythm, normal first and paradoxically split second heart sounds, no rubs or gallops, early peaking grade 2/6 systolic ejection murmur, no diastolic murmur  Abdomen: moderate distention - looks like ascites, no masses by palpation, no abnormal pulsatility or arterial bruits, normal bowel sounds, unable to palpate liver  Extremities: Large tortuous left forearm AV fistula with excellent thrill and bruit ; no clubbing, cyanosis; 3-4+ symmetrical soft pitting edema; 2+ radial, ulnar and brachial pulses bilaterally; 2+ right femoral, posterior tibial and dorsalis pedis pulses; 2+ left femoral, posterior tibial and dorsalis pedis pulses; no subclavian or femoral bruits  Neurological: grossly nonfocal except mild resting tremor Psych: euthymic mood, full affect   EKG:  EKG is ordered today. The ekg ordered today demonstrates 100% AV paced   Recent Labs: 12/22/2013: Pro B Natriuretic peptide (BNP) 16536.0* 12/23/2013: TSH 8.820* 03/25/2014: ALT 26 03/26/2014: BUN 187*; Creatinine 4.08*; Platelets 112*; Potassium 3.0*; Sodium 139 03/28/2014: Hemoglobin 9.1*      Wt Readings from Last 3 Encounters:  04/19/14 162 lb 11.2 oz (73.8 kg)  03/26/14 155 lb 4.8 oz (70.444 kg)  03/17/14 171 lb 12.8 oz (77.928 kg)     ASSESSMENT AND  PLAN:  Chronic diastolic heart failure No on HD, off diuretics. Clinically euvolemic. Asymptomatic, sedentary.  Pacemaker, MDT implanted 08/06, generator change April 2015 Normal device function today. Remote check in 3 months. Office follow-up in 6 months.  PAF (paroxysmal atrial fibrillation)and sustained atrial flutter Staying out of AF on amiodarone. normal liver function tests in March (borderline AST increase) and mildly elevated TSH with borderline decreased free T4 and T3 in December. Need to reevaluate thyroid and liver function periodically. No clinical evidence of hypothyroidism at this time. Risk of bleeding has been deemed to outweigh embolic risk. At least for now, with no AF for 6 months, I  think this is a reasonable decision. However, keep in mind very high embolic risk (CHADSVasc 5).  S/P CABG x 2: (LIMA-LAD SVG - RCA) No symptoms of active coronary insufficiency. Last nuclear study 2012  AVR 23 mm bioprosthesis Normal function by most recent echocardiogram earlier this month ( mean gradient 15). Needs endocarditis prevention.  ESRD on HD Already has a well developed AV fistula. The kidney disease pretty much limits [place] to amiodarone as the only antiarrhythmic choice.  Current medicines are reviewed at length with the patient today.  The patient does not haveconcerns regarding medicines.  The following changes have been made:  Reduce metoprolol to 25 mg daily  Labs/ tests ordered today include:  Orders Placed This Encounter  Procedures  . Hepatic function panel  . TSH  . Implantable device - remote  . EKG 12-Lead    Patient Instructions  DECREASE Metoprolol to 1/2 tablet daily..    Your physician recommends that you return for lab work in: June 2016 at dialysis.   Remote monitoring is used to monitor your pacemaker from home. This monitoring reduces the number of office visits required to check your device to one time per year. It allows [place] to keep an eye on  the functioning of your device to ensure it is working properly. You are scheduled for a device check from home on 07/19/2014. You may send your transmission at any time that day. If you have a wireless device, the transmission will be sent automatically. After your physician reviews your transmission, you will receive a postcard with your next transmission date.  Your physician recommends that you schedule a follow-up appointment in: 6 months with Dr.Christna Kulick      Signed, Thurmon Fair, MD  04/23/2014 9:02 AM    Thurmon Fair, MD, [facility] [phone] office [phone] pager

## 2014-04-21 LAB — MDC_IDC_ENUM_SESS_TYPE_REMOTE
Battery Impedance: 113 Ohm
Battery Remaining Longevity: 87 mo
Brady Statistic AP VP Percent: 100 %
Brady Statistic AS VP Percent: 0 %
Brady Statistic AS VS Percent: 0 %
Date Time Interrogation Session: 20160330113938
Lead Channel Impedance Value: 378 Ohm
Lead Channel Pacing Threshold Pulse Width: 0.4 ms
Lead Channel Setting Pacing Amplitude: 2 V
Lead Channel Setting Pacing Pulse Width: 0.4 ms
Lead Channel Setting Sensing Sensitivity: 2.8 mV
MDC IDC MSMT BATTERY VOLTAGE: 2.79 V
MDC IDC MSMT LEADCHNL RV IMPEDANCE VALUE: 532 Ohm
MDC IDC MSMT LEADCHNL RV PACING THRESHOLD AMPLITUDE: 2 V
MDC IDC SET LEADCHNL RV PACING AMPLITUDE: 4 V
MDC IDC STAT BRADY AP VS PERCENT: 0 %

## 2014-05-02 ENCOUNTER — Encounter: Payer: Self-pay | Admitting: Cardiovascular Disease

## 2014-06-24 LAB — HEPATIC FUNCTION PANEL
ALT: 9 U/L (ref 0–53)
AST: 17 U/L (ref 0–37)
Albumin: 3.9 g/dL (ref 3.5–5.2)
Alkaline Phosphatase: 59 U/L (ref 39–117)
Bilirubin, Direct: 0.1 mg/dL (ref 0.0–0.3)
Indirect Bilirubin: 0.5 mg/dL (ref 0.2–1.2)
Total Bilirubin: 0.6 mg/dL (ref 0.2–1.2)
Total Protein: 6 g/dL (ref 6.0–8.3)

## 2014-06-24 LAB — TSH: TSH: 2.945 u[IU]/mL (ref 0.350–4.500)

## 2014-07-19 ENCOUNTER — Encounter: Payer: Self-pay | Admitting: Cardiovascular Disease

## 2014-07-19 ENCOUNTER — Ambulatory Visit (INDEPENDENT_AMBULATORY_CARE_PROVIDER_SITE_OTHER): Payer: Medicare Other | Admitting: *Deleted

## 2014-07-19 DIAGNOSIS — I495 Sick sinus syndrome: Secondary | ICD-10-CM

## 2014-07-19 NOTE — Progress Notes (Signed)
Remote pacemaker transmission.   

## 2014-07-24 LAB — CUP PACEART REMOTE DEVICE CHECK
Battery Impedance: 162 Ohm
Battery Remaining Longevity: 96 mo
Battery Voltage: 2.79 V
Brady Statistic AP VP Percent: 100 %
Brady Statistic AP VS Percent: 0 %
Brady Statistic AS VP Percent: 0 %
Brady Statistic AS VS Percent: 0 %
Lead Channel Impedance Value: 587 Ohm
Lead Channel Pacing Threshold Amplitude: 1.5 V
Lead Channel Pacing Threshold Pulse Width: 0.4 ms
Lead Channel Setting Pacing Amplitude: 2 V
Lead Channel Setting Pacing Pulse Width: 0.4 ms
Lead Channel Setting Sensing Sensitivity: 2.8 mV
MDC IDC MSMT LEADCHNL RA IMPEDANCE VALUE: 392 Ohm
MDC IDC SESS DTM: 20160629113251
MDC IDC SET LEADCHNL RV PACING AMPLITUDE: 3.25 V

## 2014-08-07 ENCOUNTER — Encounter: Payer: Self-pay | Admitting: Cardiology

## 2014-08-15 ENCOUNTER — Ambulatory Visit: Payer: Self-pay | Admitting: Pharmacist Clinician (PhC)/ Clinical Pharmacy Specialist

## 2014-10-17 ENCOUNTER — Ambulatory Visit (INDEPENDENT_AMBULATORY_CARE_PROVIDER_SITE_OTHER): Payer: Medicare Other | Admitting: Cardiovascular Disease

## 2014-10-17 ENCOUNTER — Encounter: Payer: Self-pay | Admitting: Cardiovascular Disease

## 2014-10-17 VITALS — BP 124/62 | HR 82 | Resp 16 | Ht 67.0 in | Wt 163.0 lb

## 2014-10-17 DIAGNOSIS — Z951 Presence of aortocoronary bypass graft: Secondary | ICD-10-CM

## 2014-10-17 DIAGNOSIS — I480 Paroxysmal atrial fibrillation: Secondary | ICD-10-CM

## 2014-10-17 DIAGNOSIS — R001 Bradycardia, unspecified: Secondary | ICD-10-CM

## 2014-10-17 DIAGNOSIS — Z952 Presence of prosthetic heart valve: Secondary | ICD-10-CM

## 2014-10-17 DIAGNOSIS — I495 Sick sinus syndrome: Secondary | ICD-10-CM

## 2014-10-17 DIAGNOSIS — I4892 Unspecified atrial flutter: Secondary | ICD-10-CM

## 2014-10-17 DIAGNOSIS — Z954 Presence of other heart-valve replacement: Secondary | ICD-10-CM

## 2014-10-17 DIAGNOSIS — Z950 Presence of cardiac pacemaker: Secondary | ICD-10-CM

## 2014-10-17 NOTE — Patient Instructions (Addendum)
MAKE SURE YOU ARE NOT TAKING AMLODIPINE.  TAKE MIDODRINE ONE TABLET IN THE AM AND 2ND DOSE 6 HOURS LATER.  DO NOT LIE FLAT FOR 6 HOURS AFTER EACH DOSE.  Dr. Royann Shivers recommends that you schedule a follow-up appointment in: 3 MONTHS WITH PACEMAKER CHECK ([employer]).

## 2014-10-17 NOTE — Progress Notes (Signed)
Patient ID: Nathaniel Steele, male   DOB: July 20, 1926, 81 y.o.   MRN: [id number]     Cardiology Office Note   Date:  10/17/2014   ID:  Nathaniel Steele, DOB July 17, 1926, MRN [id number]  PCP:  Delorse Lek, MD  Cardiologist:   Thurmon Fair, MD   Chief Complaint  Patient presents with  . 6 MONTHS  . Dizziness      History of Present Illness: Nathaniel Steele is a 81 y.o. male who presents for pacemaker check, paroxysmal atrial fibrillation and atrial flutter, coronary artery disease status post remote bypass surgery, history of aortic valve replacement with biological prosthesis.  Since his last appointment, Diar has started hemodialysis and after about 6 months has adjusted quite well. He has had problems with recurrent hypotension and is now receiving Midodrine. He is taking this medication twice daily, morning and night. Amlodipine was still listed on his medication list, but he reports that he stopped taking that. He is not receiving any other blood pressure medications. He has not had syncope.  After his most recent episode of gastrointestinal bleeding in March 2016, he has not received any anticoagulation. Upper endoscopy, colonoscopy and even capsule enteroscopy have failed to show the source of bleeding. He is no longer taking iron supplements. He had previously had severe anemia related to spontaneous bleeding in his right leg in 2015 and again GI bleeding in June 2015. In November 2015 he underwent electrical cardioversion for atypical atrial flutter (possibly left atrial flutter), after overdrive pacing was unsuccessful. He does not have a history of stroke or transient ischemic attack or other embolic events.  Pacemaker interrogation shows normal device function. The current generator, implant in 2015 has an estimated longevity of another 7 years. There is 91% atrial pacing (he had 100% atrial pacing until the onset of atrial flutter 6 weeks ago) and 100% ventricular pacing. He has not  noticed any change in his stamina or other symptoms over the last 6 weeks since the initiation of atrial flutter.  He has a past history of coronary disease and previous two-vessel bypass surgery, aortic valve replacement with a biological prosthesis in 2013. He has hyperlipidemia and hypertension. His last echo in March 2016 showed LVEF 55% and normal bioprosthetic valve function (mean gradient 15 mm Hg). 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 He has a history of neuropathy and restless leg syndrome and had frostbite while in [place] during the war. He has had surgery twice for lumbar spine disease.He gets many of his medications from the [facility] hospital and prefers to have the labs drawn there.  Past Medical History  Diagnosis Date  . Shortness of breath with exertion  . Chronic kidney disease     not on dialysis yet  . Coronary artery disease 05/14/2010    stress test - no scintigraphic evidence of inducible myocardial ischemia,; normal study  . Hypertension   . Arthritis   . Blood dyscrasia     one time had low platlet count  . Dysrhythmia     atrial fib  . SSS (sick sinus syndrome) 08/27/2011    echo EF >55%, severe LAE, moderate RAE, tissue AVR w/ gradients 35 and  . Pericardial effusion 03/12/2011    echo EF 55-60%  . Claudication 02/19/2012    LE doppler no evidence of arterial insufficiency, lower extremities demonstrate normal values  w/ no evidence of insufficiency  . Lupus nephritis   . Pacemaker  09/19/2004    implanted  . Restless leg syndrome   . Cellulitis   . Anemia   . Benign prostatic hypertrophy   . Constipation   . Shoulder joint pain   . Acute on chronic diastolic CHF (congestive heart failure), NYHA class 3 12/07/2013    Past Surgical History  Procedure Laterality Date  . Appendectomy    . Hernia repair    . Pacemaker insertion  2006    Medtronic EnRhythm  . Av fistula placement  02/24/2011  . Aortic valve  replacement  02/24/2011    pericardial tissue valve [facility] Ease  . Coronary artery bypass graft  02/24/2011    LIMA to LAD, SVG to distal RCA  . Cardiac catheterization  02/13/2011    mod/severe aortic valve stenosis w peak to peak gradient 25-32 mmHg,  70% stenosis LAD, 30-40%proximal followed by 90% sstenosis in RCA prior to anterior RV margin branch  . Lumbar laminectomy/decompression microdiscectomy Right 04/16/2012    Procedure: DECOMPRESSIVE L4 - L5/ MICRODISCECTOMY ON THE RIGHT 1 LEVEL;  Surgeon: Jacki Cones, MD;  Location: [facility] ORS;  Service: Orthopedics;  Laterality: Right;  . Esophagogastroduodenoscopy N/A 06/21/2013    Procedure: ESOPHAGOGASTRODUODENOSCOPY (EGD);  Surgeon: Theda Belfast, MD;  Location: [facility] ENDOSCOPY;  Service: Endoscopy;  Laterality: N/A;  . Givens capsule study N/A 06/21/2013    Procedure: GIVENS CAPSULE STUDY;  Surgeon: Theda Belfast, MD;  Location: [facility] ENDOSCOPY;  Service: Endoscopy;  Laterality: N/A;  . Cardioversion N/A 12/09/2013    Procedure: CARDIOVERSION;  Surgeon: Thurmon Fair, MD;  Location: [facility] ENDOSCOPY;  Service: Cardiovascular;  Laterality: N/A;  . Left and right heart catheterization with coronary angiogram N/A 02/13/2011    Procedure: LEFT AND RIGHT HEART CATHETERIZATION WITH CORONARY ANGIOGRAM;  Surgeon: Lennette Bihari, MD;  Location: [facility] CATH LAB;  Service: Cardiovascular;  Laterality: N/A;  . Permanent pacemaker generator change N/A 05/10/2013    Procedure: PERMANENT PACEMAKER GENERATOR CHANGE;  Surgeon: Thurmon Fair, MD;  Location: [facility] CATH LAB;  Service: Cardiovascular;  Laterality: N/A;  . Givens capsule study N/A 03/25/2014    Procedure: GIVENS CAPSULE STUDY;  Surgeon: Theda Belfast, MD;  Location: [facility] ENDOSCOPY;  Service: Endoscopy;  Laterality: N/A;     Current Outpatient Prescriptions  Medication Sig Dispense Refill  . amiodarone (PACERONE) 200 MG tablet Take 1 tablet (200 mg total) by mouth daily. 60 tablet 11  . calcium acetate (PHOSLO)  667 MG capsule Take 667 mg by mouth every morning.     . Cyanocobalamin (VITAMIN B 12 PO) Take 1 tablet by mouth daily.    . cycloSPORINE (RESTASIS) 0.05 % ophthalmic emulsion Place 1 drop into both eyes 2 (two) times daily.     . fish oil-omega-3 fatty acids 1000 MG capsule Take 1 g by mouth daily.     . midodrine (PROAMATINE) 2.5 MG tablet Take 2.5 mg by mouth 2 (two) times daily with a meal. TAKE ONE TABLET IN THE am AND SECOND DOSE 6 HOURS LATER    . Multiple Vitamins-Minerals (OCUVITE PRESERVISION PO) Take 1 tablet by mouth daily.    . pantoprazole (PROTONIX) 40 MG tablet Take 1 tablet (40 mg total) by mouth 2 (two) times daily. 60 tablet 0  . rOPINIRole (REQUIP) 2 MG tablet Take 1 tablet (2 mg total) by mouth 2 (two) times daily. 30 tablet 0   No current facility-administered medications for this visit.    Allergies:   Statins; Aspirin; Feraheme; and Penicillins  Social History:  The patient  reports that he quit smoking about 57 years ago. His smoking use included Cigarettes. He has never used smokeless tobacco. He reports that he does not drink alcohol or use illicit drugs.   Family History:  The patient's family history includes Hypertension in his mother; Kidney disease in his brother.    ROS:  Please see the history of present illness.    Otherwise, review of systems positive for none.   All other systems are reviewed and negative.    PHYSICAL EXAM: VS:  BP 124/62 mmHg  Pulse 82  Resp 16  Ht  (1.702 m)  Wt 163 lb (73.936 kg)  BMI 25.52 kg/m2 , BMI Body mass index is 25.52 kg/(m^2).  General: Alert, oriented x3, no distress Head: no evidence of trauma, PERRL, EOMI, no exophtalmos or lid lag, no myxedema, no xanthelasma; normal ears, nose and oropharynx Neck: normal jugular venous pulsations and no hepatojugular reflux; brisk carotid pulses without delay and no carotid bruits Chest: clear to auscultation, no signs of consolidation by percussion or palpation, normal  fremitus, symmetrical and full respiratory excursions Cardiovascular: normal position and quality of the apical impulse, regular rhythm, normal first and paradoxically split second heart sounds, 2/6 early peaking systolic murmur in the aortic focus, no diastolic murmurs, rubs or gallops Abdomen: no tenderness or distention, no masses by palpation, no abnormal pulsatility or arterial bruits, normal bowel sounds, no hepatosplenomegaly Extremities: no clubbing, cyanosis or edema; 2+ radial, ulnar and brachial pulses bilaterally; 2+ right femoral, posterior tibial and dorsalis pedis pulses; 2+ left femoral, posterior tibial and dorsalis pedis pulses; no subclavian or femoral bruits Large tortuous left forearm AV fistula with excellent thrill and bruit  Neurological: grossly nonfocal Psych: euthymic mood, full affect   EKG:  EKG is ordered today. The ekg ordered today demonstrates atrial flutter and 100% ventricular pacing   Recent Labs: 12/22/2013: Pro B Natriuretic peptide (BNP) 16536.0* 03/26/2014: BUN 187*; Creatinine, Ser 4.08*; Platelets 112*; Potassium 3.0*; Sodium 139 03/28/2014: Hemoglobin 9.1* 06/23/2014: ALT 9; TSH 2.945    Lipid Panel    Component Value Date/Time   CHOL 190 07/08/2012 0955   TRIG 164* 07/08/2012 0955   HDL 28* 07/08/2012 0955   CHOLHDL 6.8 07/08/2012 0955   VLDL 33 07/08/2012 0955   LDLCALC 129* 07/08/2012 0955      Wt Readings from Last 3 Encounters:  10/17/14 163 lb (73.936 kg)  04/19/14 162 lb 11.2 oz (73.8 kg)  03/26/14 155 lb 4.8 oz (70.444 kg)      Other studies Reviewed: Additional studies/ records that were reviewed today include: Records of hospitalization March 2016.   ASSESSMENT AND PLAN:  Chronic diastolic heart failure This has become much easier to manage after initiation of hemodialysis. He appears to be euvolemic today. He could well tolerate a little extra fluid to prevent his hypotension. I don't think the atrial arrhythmia has led to  any decompensation. Normal EF by echo in March 2016.  Orthostatic hypotension I have advised him to take the Midodrine first thing in the morning and then 6 hours later, rather than every 12 hours. His evening dose of Midodrine should be at least 6 hours before he plans to go to bed. He should not lie supine after taking this medication since it would possibly cause severe supine hypertension  Pacemaker, MDT implanted 08/06, generator change April 2015 Normal device function today. The device shows atrial flutter, sustained for the last 6 weeks. If he  remains in this rhythm at his next pacemaker check, will consider this to be a permanent arrhythmia and will plan to switch to device to VVIR mode. We'll plan to stop the amiodarone if he is still in atrial flutter at his next appointment.  PAF (paroxysmal atrial fibrillation)and sustained atrial flutter  He may have a left atrial flutter. His left atrium is severely dilated. I don't think there are any good options for long-term maintenance of normal rhythm area. Unable to continue anticoagulation due to repeated episodes of bleeding that required transfusion. I would even advise against use of aspirin, since this will provide very little protection from embolic events and might still precipitate bleeding. For the same reasons, he is not a good candidate for another attempt at cardioversion.  S/P CABG x 2: (LIMA-LAD SVG - RCA) No symptoms of active coronary insufficiency. Last nuclear study 2012, normal.  AVR 23 mm bioprosthesis Normal function by most recent echocardiogram March 2016  (mean gradient 16 mmHg).  End-stage renal disease on hemodialysis (Dr. Arrie Aran) The kidney disease pretty much limits [place] to amiodarone as the only antiarrhythmic choice.   Current medicines are reviewed at length with the patient today.  The patient does not have concerns regarding medicines.   Labs/ tests ordered today include:  Orders Placed This Encounter   Procedures  . EKG 12-Lead    Patient Instructions  MAKE SURE YOU ARE NOT TAKING AMLODIPINE.  TAKE MIDODRINE ONE TABLET IN THE AM AND 2ND DOSE 6 HOURS LATER.  DO NOT LIE FLAT FOR 6 HOURS AFTER EACH DOSE.  Dr. Royann Shivers recommends that you schedule a follow-up appointment in: 3 MONTHS WITH PACEMAKER CHECK ([facility]).       Joie Bimler, MD  10/17/2014 5:48 PM    Thurmon Fair, MD, [facility] [phone] office [phone] pager

## 2014-11-07 LAB — CUP PACEART INCLINIC DEVICE CHECK
Battery Voltage: 2.79 V
Brady Statistic AP VP Percent: 91.3 %
Brady Statistic AP VS Percent: 0.1 % — CL
Brady Statistic AS VP Percent: 8.7 %
Brady Statistic AS VS Percent: 0.1 % — CL
Implantable Lead Implant Date: 20060831
Implantable Lead Location: 753860
Implantable Lead Model: 5594
Lead Channel Impedance Value: 398 Ohm
Lead Channel Impedance Value: 563 Ohm
Lead Channel Pacing Threshold Amplitude: 1.75 V
Lead Channel Sensing Intrinsic Amplitude: 1 mV
Lead Channel Sensing Intrinsic Amplitude: 11.2 mV
Lead Channel Setting Pacing Amplitude: 2 V
Lead Channel Setting Pacing Amplitude: 3.75 V
Lead Channel Setting Pacing Pulse Width: 0.4 ms
MDC IDC LEAD IMPLANT DT: 20060831
MDC IDC LEAD LOCATION: 753859
MDC IDC MSMT BATTERY REMAINING LONGEVITY: 84 mo
MDC IDC MSMT LEADCHNL RV PACING THRESHOLD PULSEWIDTH: 0.4 ms
MDC IDC SESS DTM: 20161018161155
MDC IDC SET LEADCHNL RV SENSING SENSITIVITY: 2.8 mV

## 2015-01-09 ENCOUNTER — Ambulatory Visit (INDEPENDENT_AMBULATORY_CARE_PROVIDER_SITE_OTHER): Payer: Medicare Other | Admitting: Cardiovascular Disease

## 2015-01-09 ENCOUNTER — Encounter: Payer: Self-pay | Admitting: Cardiovascular Disease

## 2015-01-09 VITALS — BP 130/70 | HR 86 | Ht 67.5 in | Wt 168.0 lb

## 2015-01-09 DIAGNOSIS — R001 Bradycardia, unspecified: Secondary | ICD-10-CM

## 2015-01-09 DIAGNOSIS — I495 Sick sinus syndrome: Secondary | ICD-10-CM | POA: Diagnosis not present

## 2015-01-09 DIAGNOSIS — Z950 Presence of cardiac pacemaker: Secondary | ICD-10-CM | POA: Diagnosis not present

## 2015-01-09 DIAGNOSIS — Z953 Presence of xenogenic heart valve: Secondary | ICD-10-CM | POA: Insufficient documentation

## 2015-01-09 DIAGNOSIS — I480 Paroxysmal atrial fibrillation: Secondary | ICD-10-CM

## 2015-01-09 DIAGNOSIS — I5032 Chronic diastolic (congestive) heart failure: Secondary | ICD-10-CM

## 2015-01-09 DIAGNOSIS — I4892 Unspecified atrial flutter: Secondary | ICD-10-CM

## 2015-01-09 DIAGNOSIS — N186 End stage renal disease: Secondary | ICD-10-CM

## 2015-01-09 DIAGNOSIS — Z954 Presence of other heart-valve replacement: Secondary | ICD-10-CM

## 2015-01-09 DIAGNOSIS — I25118 Atherosclerotic heart disease of native coronary artery with other forms of angina pectoris: Secondary | ICD-10-CM

## 2015-01-09 NOTE — Patient Instructions (Signed)
Your physician has recommended you make the following change in your medication: STOP AMIODARONE  Remote monitoring is used to monitor your Pacemaker or from home. This monitoring reduces the number of office visits required to check your device to one time per year. It allows us to monitor the functioning of your device to ensure it is working properly. You are scheduled for a device check from home on April 10, 2015. You may send your transmission at any time that day. If you have a wireless device, the transmission will be sent automatically. After your physician reviews your transmission, you will receive a postcard with your next transmission date.  Dr. Royann Shiversroitoru recommends that you schedule a follow-up appointment in: 6 MONTHS  WITH A PACEMAKER CHECK ([facility]).

## 2015-01-09 NOTE — Progress Notes (Signed)
Patient ID: Nathaniel Steele, male   DOB: 21-Sep-1926, 81 y.o.   MRN: [id number]    Cardiology Office Note    Date:  01/09/2015   ID:  VICTORIA EUCEDA, DOB 1926/11/25, MRN [id number]  PCP:  Delorse Lek, MD  Cardiologist:   Thurmon Fair, MD   Chief Complaint  Patient presents with  . Follow-up    no chest pain, no shortness of breath, has pain in knee, no lightheaded or dizziness, no edmea    History of Present Illness:  ROMMIE DUNN is a 81 y.o. male with chronic diastolic heart failure, recent problems with symptomatic hypotension, incisional disease on hemodialysis, sinus node dysfunction status post pacemaker, paroxysmal atrial fibrillation and atrial flutter, coronary artery disease status post remote bypass surgery, history of aortic valve replacement with biological prosthesis.  Since his last appointment he has adjusted better to hemodialysis. Treatment with Midodrine has led to fewer episodes of symptomatic hypotension during dialysis. Still makes urine roughly 3 times a day, and he gains a little weight between dialysis sessions. He has not had overt bleeding problems. He is not receiving anticoagulation due to recurrent episodes of gastrointestinal bleeding without a clear cause despite upper and lower endoscopy and capsule enteroscopy leading to severe anemia. He also had a spontaneous hemorrhage into his right leg in 2015.  As always he is very stoic and actually seems to be feeling well. He denies complaints of dyspnea, angina, syncope, palpitations, lower extremity edema.  Pacemaker interrogation shows normal device function. The current generator, implant in 2015 has an estimated longevity of another 8.5 years. There is 100% atrial fibrillation since July and 100% ventricular pacing. Lead parameters remain excellent.   He is still on amiodarone. He is not on anticoagulants due to severe bleeding problems. He appears to be oblivious to the atrial fibrillation and this has not  made any significant change in his stamina. Heart failure management has become much easier after initiation of hemodialysis.  He is not receiving anticoagulation due to recurrent episodes of gastrointestinal bleeding without a clear cause despite upper and lower endoscopy and capsule enteroscopy leading to severe anemia. He also had a spontaneous hemorrhage into his right leg in 2015.  He has a past history of coronary disease and previous two-vessel bypass surgery, aortic valve replacement with a biological prosthesis in 2013. He has hyperlipidemia and hypertension. His last echo in March 2016 showed LVEF 55% and normal bioprosthetic valve function (mean gradient 15 mm Hg). 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 He gets hemodialysis Monday Wednesday and Friday, Dr. Casimiro Needle. He has a history of neuropathy and restless leg syndrome and had frostbite while in [place] during the war. He has had surgery twice for lumbar spine disease.He gets many of his medications from the [facility] hospital and prefers to have the labs drawn there.  Past Medical History  Diagnosis Date  . Shortness of breath with exertion  . Chronic kidney disease     not on dialysis yet  . Coronary artery disease 05/14/2010    stress test - no scintigraphic evidence of inducible myocardial ischemia,; normal study  . Hypertension   . Arthritis   . Blood dyscrasia     one time had low platlet count  . Dysrhythmia     atrial fib  . SSS (sick sinus syndrome) (HCC) 08/27/2011    echo EF >55%, severe LAE, moderate RAE, tissue AVR w/ gradients 35 and  .  Pericardial effusion 03/12/2011    echo EF 55-60%  . Claudication (HCC) 02/19/2012    LE doppler no evidence of arterial insufficiency, lower extremities demonstrate normal values  w/ no evidence of insufficiency  . Lupus nephritis (HCC)   . Pacemaker 09/19/2004    implanted  . Restless leg syndrome   . Cellulitis   . Anemia   .  Benign prostatic hypertrophy   . Constipation   . Shoulder joint pain   . Acute on chronic diastolic CHF (congestive heart failure), NYHA class 3 (HCC) 12/07/2013    Past Surgical History  Procedure Laterality Date  . Appendectomy    . Hernia repair    . Pacemaker insertion  2006    Medtronic EnRhythm  . Av fistula placement  02/24/2011  . Aortic valve replacement  02/24/2011    pericardial tissue valve [facility] Ease  . Coronary artery bypass graft  02/24/2011    LIMA to LAD, SVG to distal RCA  . Cardiac catheterization  02/13/2011    mod/severe aortic valve stenosis w peak to peak gradient 25-32 mmHg,  70% stenosis LAD, 30-40%proximal followed by 90% sstenosis in RCA prior to anterior RV margin branch  . Lumbar laminectomy/decompression microdiscectomy Right 04/16/2012    Procedure: DECOMPRESSIVE L4 - L5/ MICRODISCECTOMY ON THE RIGHT 1 LEVEL;  Surgeon: Jacki Conesonald A Gioffre, MD;  Location: [facility];  Service: Orthopedics;  Laterality: Right;  . Esophagogastroduodenoscopy N/A 06/21/2013    Procedure: ESOPHAGOGASTRODUODENOSCOPY (EGD);  Surgeon: Theda BelfastPatrick D Hung, MD;  [facility];  Service: Endoscopy;  Laterality: N/A;  . Givens capsule study N/A 06/21/2013    Procedure: GIVENS CAPSULE STUDY;  Surgeon: Theda BelfastPatrick D Hung, MD;  [facility];  Service: Endoscopy;  Laterality: N/A;  . Cardioversion N/A 12/09/2013    Procedure: CARDIOVERSION;  Surgeon: Thurmon FairMihai Sybil Shrader, MD;  Location: [facility] ENDOSCOPY;  Service: Cardiovascular;  Laterality: N/A;  . Left and right heart catheterization with coronary angiogram N/A 02/13/2011    Procedure: LEFT AND RIGHT HEART CATHETERIZATION WITH CORONARY ANGIOGRAM;  Surgeon: Lennette Biharihomas A Kelly, MD;  [facility];  Service: Cardiovascular;  Laterality: N/A;  . Permanent pacemaker generator change N/A 05/10/2013    Procedure: PERMANENT PACEMAKER GENERATOR CHANGE;  Surgeon: Thurmon FairMihai Mykelle Cockerell, MD;  Location: [facility] CATH LAB;  Service: Cardiovascular;  Laterality: N/A;  .  Givens capsule study N/A 03/25/2014    Procedure: GIVENS CAPSULE STUDY;  Surgeon: Theda BelfastPatrick D Hung, MD;  [facility];  Service: Endoscopy;  Laterality: N/A;    Current Outpatient Prescriptions  Medication Sig Dispense Refill  . calcium acetate (PHOSLO) 667 MG capsule Take 667 mg by mouth every morning.     . Cyanocobalamin (VITAMIN B 12 PO) Take 1 tablet by mouth daily.    . cycloSPORINE (RESTASIS) 0.05 % ophthalmic emulsion Place 1 drop into both eyes 2 (two) times daily.     . fish oil-omega-3 fatty acids 1000 MG capsule Take 1 g by mouth daily.     . midodrine (PROAMATINE) 2.5 MG tablet Take 2.5 mg by mouth 2 (two) times daily with a meal. TAKE ONE TABLET IN THE am AND SECOND DOSE 6 HOURS LATER    . Multiple Vitamins-Minerals (OCUVITE PRESERVISION PO) Take 1 tablet by mouth daily.    . pantoprazole (PROTONIX) 40 MG tablet Take 1 tablet (40 mg total) by mouth 2 (two) times daily. 60 tablet 0  . rOPINIRole (REQUIP) 2 MG tablet Take 1 tablet (2 mg total) by mouth 2 (two) times daily.  30 tablet 0   No current facility-administered medications for this visit.    Allergies:   Statins; Aspirin; Feraheme; and Penicillins   Social History   Social History  . Marital Status: Married    Spouse Name: [name]  . Number of Children: 1  . Years of Education: 6   Social History Main Topics  . Smoking status: Former Smoker    Types: Cigarettes    Quit date: 02/11/1957  . Smokeless tobacco: Never Used  . Alcohol Use: No  . Drug Use: No  . Sexual Activity: Not Asked   Other Topics Concern  . None   Social History Narrative   Patient is married Interior and spatial designer).   Patient has one child.   Patient does not drink caffeine.   Patient is right-handed.   Patient is retired.   Patient has a 6th grade education.           Family History:  The patient's family history includes Hypertension in his mother; Kidney disease in his brother.   ROS:   Please see the history of present illness.      Review of Systems  All other systems reviewed and are negative.    PHYSICAL EXAM:   VS:  BP 130/70 mmHg  Pulse 86  Ht 5' 7.5" (1.715 m)  Wt 168 lb (76.204 kg)  BMI 25.91 kg/m2   GEN: Well nourished, well developed, in no acute distress HEENT: normal Neck: no JVD, carotid bruits, or masses Cardiac: Paradoxically split second heart sound, RRR; grade 2/6 early peaking aortic ejection murmur no diastolic murmurs, rubs, or gallops,no edema  Respiratory:  clear to auscultation bilaterally, normal work of breathing GI: soft, nontender, nondistended, + BS MS: no deformity or atrophy Skin: warm and dry, no rash Neuro:  Alert and Oriented x 3, Strength and sensation are intact Psych: euthymic mood, full affect  Wt Readings from Last 3 Encounters:  01/09/15 168 lb (76.204 kg)  10/17/14 163 lb (73.936 kg)  04/19/14 162 lb 11.2 oz (73.8 kg)      Studies/Labs Reviewed:   EKG:  EKG is ordered today.  The ekg ordered today demonstrates atrial sensed ventricular paced rhythm (background atrial fibrillation)  Recent Labs: 03/26/2014: BUN 187*; Creatinine, Ser 4.08*; Platelets 112*; Potassium 3.0*; Sodium 139 03/28/2014: Hemoglobin 9.1* 06/23/2014: ALT 9; TSH 2.945   Lipid Panel    Component Value Date/Time   CHOL 190 07/08/2012 0955   TRIG 164* 07/08/2012 0955   HDL 28* 07/08/2012 0955   CHOLHDL 6.8 07/08/2012 0955   VLDL 33 07/08/2012 0955   LDLCALC 129* 07/08/2012 0955      ASSESSMENT:    1. Chronic diastolic heart failure (HCC)   2. PAF (paroxysmal atrial fibrillation) (HCC)   3. SSS (sick sinus syndrome) (HCC)   4. Pacemaker, MDT implanted 08/06, generator change April 2015   5. Coronary artery disease involving native coronary artery of native heart with other form of angina pectoris (HCC)   6. ESRD (end stage renal disease) (HCC)   7. History of aortic valve replacement with bioprosthetic valve      PLAN:  In order of problems listed above:  1. Heart failure  management has become much easier following initiation of hemodialysis. He appears to be clinically euvolemic, NYHA functional class I-II (he is quite sedentary). He still has decent urine output 2. Long-term persistent atrial fibrillation may be settling into a pattern of permanent atrial fibrillation. He has a severely dilated left atrium. Amiodarone seems to  be serving very little purpose. Will stop it has dyspnea lead to a reduction the need for ventricular pacing. At least we will avoid its multiple possible side effects. Despite his high embolic risk(CHADSvasc score 4) he has had very serious bleeding complications and is not receiving even aspirin for stroke prevention. 3. If the atrial fibrillation seems to settle into a pattern of permanent arrhythmia, will change pacemaker settings to VVIR 4. Normal pacemaker function. Repeat pacemaker download every 3 months via [facility] and office visit in 6 months 5. No symptoms of active coronary insufficiency at this time 6. Chronic hemodialysis Monday, Wednesday, Friday 7. Normally functioning aortic valve biological prosthesis. Most recent echo in March 2016 showed a mean gradient of 16 mmHg in the setting of normal left ventricular systolic function.   Medication Adjustments/Labs and Tests Ordered: Current medicines are reviewed at length with the patient today.  Concerns regarding medicines are outlined above.  Medication changes, Labs and Tests ordered today are listed below. Patient Instructions  Your physician has recommended you make the following change in your medication: STOP AMIODARONE  Remote monitoring is used to monitor your Pacemaker or from home. This monitoring reduces the number of office visits required to check your device to one time per year. It allows [place] to monitor the functioning of your device to ensure it is working properly. You are scheduled for a device check from home on April 10, 2015. You may send your transmission at any  time that day. If you have a wireless device, the transmission will be sent automatically. After your physician reviews your transmission, you will receive a postcard with your next transmission date.  Dr. Royann Shivers recommends that you schedule a follow-up appointment in: 6 MONTHS  WITH A PACEMAKER CHECK (MEDTRONIC - GRAY).         Joie Bimler, MD  01/09/2015 6:13 PM    [facility] [address] [place], [place], [place]  [address] Phone: [phone]; Fax: [phone]

## 2015-01-17 LAB — CUP PACEART INCLINIC DEVICE CHECK
Battery Impedance: 162 Ohm
Battery Remaining Longevity: 104 mo
Battery Voltage: 2.79 V
Brady Statistic AP VP Percent: 14 %
Brady Statistic AP VS Percent: 0 %
Brady Statistic AS VP Percent: 86 %
Brady Statistic AS VS Percent: 0 %
Implantable Lead Implant Date: 20060831
Implantable Lead Location: 753859
Implantable Lead Location: 753860
Implantable Lead Model: 5092
Lead Channel Impedance Value: 404 Ohm
Lead Channel Setting Pacing Amplitude: 2 V
Lead Channel Setting Pacing Pulse Width: 0.4 ms
Lead Channel Setting Sensing Sensitivity: 2.8 mV
MDC IDC LEAD IMPLANT DT: 20060831
MDC IDC MSMT LEADCHNL RV IMPEDANCE VALUE: 545 Ohm
MDC IDC SESS DTM: 20161220145824
MDC IDC SET LEADCHNL RV PACING AMPLITUDE: 3.25 V

## 2015-01-19 ENCOUNTER — Encounter: Payer: Self-pay | Admitting: Cardiovascular Disease

## 2015-04-10 ENCOUNTER — Encounter: Payer: Medicare Other | Admitting: *Deleted

## 2015-04-10 ENCOUNTER — Telehealth: Payer: Self-pay | Admitting: Cardiology

## 2015-04-10 NOTE — Telephone Encounter (Signed)
LMOVM reminding pt to send remote transmission.   

## 2015-04-12 ENCOUNTER — Encounter: Payer: Self-pay | Admitting: *Deleted

## 2015-04-12 ENCOUNTER — Encounter: Payer: Self-pay | Admitting: Cardiovascular Disease

## 2015-04-12 ENCOUNTER — Ambulatory Visit (INDEPENDENT_AMBULATORY_CARE_PROVIDER_SITE_OTHER): Payer: Medicare Other | Admitting: Cardiovascular Disease

## 2015-04-12 DIAGNOSIS — I4891 Unspecified atrial fibrillation: Secondary | ICD-10-CM

## 2015-04-12 DIAGNOSIS — I4821 Permanent atrial fibrillation: Secondary | ICD-10-CM

## 2015-04-12 DIAGNOSIS — I482 Chronic atrial fibrillation: Secondary | ICD-10-CM

## 2015-04-12 LAB — CUP PACEART INCLINIC DEVICE CHECK
Battery Remaining Longevity: 93 mo
Battery Voltage: 2.79 V
Brady Statistic RV Percent Paced: 100 %
Implantable Lead Implant Date: 20060831
Implantable Lead Implant Date: 20060831
Implantable Lead Location: 753859
Implantable Lead Location: 753860
Implantable Lead Model: 5092
Implantable Lead Model: 5594
MDC IDC MSMT BATTERY IMPEDANCE: 162 Ohm
MDC IDC MSMT LEADCHNL RA IMPEDANCE VALUE: 67 Ohm
MDC IDC MSMT LEADCHNL RV IMPEDANCE VALUE: 511 Ohm
MDC IDC SESS DTM: 20170324202552
MDC IDC SET LEADCHNL RV PACING AMPLITUDE: 3.75 V
MDC IDC SET LEADCHNL RV PACING PULSEWIDTH: 0.4 ms
MDC IDC SET LEADCHNL RV SENSING SENSITIVITY: 2.8 mV

## 2015-04-12 NOTE — Progress Notes (Signed)
Patient ID: Nathaniel Steele, male   DOB: 09/18/1926, 82 y.o.   MRN: [id number]  Renae Fickleaul was unable to perform a remote download from his Medtronic pacemaker. He was in the office today accompanying his wife [name] for her appointment so we checked his device. He remains in atrial fibrillation without interruption. Estimated generator longevity is about 7 years. He has high-grade AV block and has 100% ventricular pacing even after stopping amiodarone. The lead parameters remain good. There are no episodes of high ventricular response. Heart rate histogram distribution remains favorable. His pacemaker was reprogrammed to VVIR mode. Plan remote download in 3 months.  Thurmon FairMihai Patricie Geeslin, MD, [facility] [phone] office [phone] pager

## 2015-04-13 ENCOUNTER — Ambulatory Visit (INDEPENDENT_AMBULATORY_CARE_PROVIDER_SITE_OTHER): Payer: Medicare Other | Admitting: *Deleted

## 2015-04-13 ENCOUNTER — Encounter: Payer: Self-pay | Admitting: Cardiology

## 2015-04-13 DIAGNOSIS — I495 Sick sinus syndrome: Secondary | ICD-10-CM

## 2015-04-16 NOTE — Progress Notes (Signed)
Remote pacemaker transmission.   

## 2015-04-18 NOTE — Addendum Note (Signed)
Addended by: Melvern SampleATES-LAND, Takeru Bose M on: 04/18/2015 02:01 PM   Modules accepted: Level of Service

## 2015-07-16 ENCOUNTER — Telehealth: Payer: Self-pay | Admitting: Cardiology

## 2015-07-16 ENCOUNTER — Ambulatory Visit (INDEPENDENT_AMBULATORY_CARE_PROVIDER_SITE_OTHER): Payer: Medicare Other | Admitting: *Deleted

## 2015-07-16 DIAGNOSIS — I495 Sick sinus syndrome: Secondary | ICD-10-CM

## 2015-07-16 LAB — CUP PACEART REMOTE DEVICE CHECK
Brady Statistic RV Percent Paced: 100 %
Implantable Lead Implant Date: 20060831
Implantable Lead Location: 753860
Implantable Lead Model: 5092
Lead Channel Impedance Value: 541 Ohm
Lead Channel Impedance Value: 67 Ohm
Lead Channel Setting Pacing Pulse Width: 0.4 ms
MDC IDC LEAD IMPLANT DT: 20060831
MDC IDC LEAD LOCATION: 753859
MDC IDC MSMT BATTERY IMPEDANCE: 211 Ohm
MDC IDC MSMT BATTERY REMAINING LONGEVITY: 103 mo
MDC IDC MSMT BATTERY VOLTAGE: 2.79 V
MDC IDC MSMT LEADCHNL RV PACING THRESHOLD AMPLITUDE: 1.75 V
MDC IDC MSMT LEADCHNL RV PACING THRESHOLD PULSEWIDTH: 0.4 ms
MDC IDC SESS DTM: 20170626193831
MDC IDC SET LEADCHNL RV PACING AMPLITUDE: 3.5 V
MDC IDC SET LEADCHNL RV SENSING SENSITIVITY: 2.8 mV

## 2015-07-16 NOTE — Telephone Encounter (Signed)
Confirmed remote transmission w/ pt son.    

## 2015-07-16 NOTE — Progress Notes (Signed)
Remote pacemaker transmission.   

## 2015-07-18 ENCOUNTER — Encounter: Payer: Self-pay | Admitting: Cardiology

## 2015-07-26 ENCOUNTER — Ambulatory Visit: Payer: Medicare Other | Admitting: Pediatrics

## 2015-08-06 ENCOUNTER — Telehealth: Payer: Self-pay | Admitting: Cardiovascular Disease

## 2015-08-06 ENCOUNTER — Telehealth: Payer: Self-pay | Admitting: Cardiology

## 2015-08-06 NOTE — Telephone Encounter (Signed)
Transmission received. 2 "VHR" episodes- previously reviewed, rates/markers suggest noise. Histogram appropriate, HRs ranging from 70-130bpm. Amiodarone discontinued in December. Mrs. Nathaniel Steele says that in the last 2-3 weeks Mr. Nathaniel Steele has noticed his heart rate being elevated. He denies other symptoms such as shortness of breath or chest pain. Due to see Dr. Salena Steele in September. Will route to Dr. Royann Shiversroitoru for further recommendations.

## 2015-08-06 NOTE — Telephone Encounter (Signed)
Error

## 2015-08-06 NOTE — Telephone Encounter (Signed)
I would not restart amiodarone. Let's discuss in September

## 2015-08-06 NOTE — Telephone Encounter (Signed)
Ms. Nathaniel Steele made aware- no changes at this time. She is agreeable.

## 2015-08-06 NOTE — Telephone Encounter (Signed)
Pt wife stated that pt stopped amiodarone in December 2016 when pt last saw MD. Pt wife wants to know if pt should start amiodarone back since pt is starting to feel racing heart rates. Instructed pt wife to send a remote transmission and someone would call back. Pt wife verbalized understanding.

## 2015-10-16 ENCOUNTER — Ambulatory Visit (INDEPENDENT_AMBULATORY_CARE_PROVIDER_SITE_OTHER): Payer: Medicare Other | Admitting: Cardiovascular Disease

## 2015-10-16 ENCOUNTER — Encounter: Payer: Self-pay | Admitting: Cardiovascular Disease

## 2015-10-16 VITALS — BP 142/76 | HR 78 | Ht 67.0 in | Wt 157.2 lb

## 2015-10-16 DIAGNOSIS — Z950 Presence of cardiac pacemaker: Secondary | ICD-10-CM

## 2015-10-16 DIAGNOSIS — I25118 Atherosclerotic heart disease of native coronary artery with other forms of angina pectoris: Secondary | ICD-10-CM

## 2015-10-16 DIAGNOSIS — I480 Paroxysmal atrial fibrillation: Secondary | ICD-10-CM

## 2015-10-16 DIAGNOSIS — Z952 Presence of prosthetic heart valve: Secondary | ICD-10-CM

## 2015-10-16 DIAGNOSIS — N186 End stage renal disease: Secondary | ICD-10-CM

## 2015-10-16 DIAGNOSIS — I953 Hypotension of hemodialysis: Secondary | ICD-10-CM

## 2015-10-16 DIAGNOSIS — I959 Hypotension, unspecified: Secondary | ICD-10-CM | POA: Insufficient documentation

## 2015-10-16 DIAGNOSIS — Z954 Presence of other heart-valve replacement: Secondary | ICD-10-CM

## 2015-10-16 DIAGNOSIS — I5032 Chronic diastolic (congestive) heart failure: Secondary | ICD-10-CM | POA: Diagnosis not present

## 2015-10-16 NOTE — Patient Instructions (Signed)
Dr Croitoru recommends that you continue on your current medications as directed. Please refer to the Current Medication list given to you today.  Remote monitoring is used to monitor your Pacemaker of ICD from home. This monitoring reduces the number of office visits required to check your device to one time per year. It allows us to keep an eye on the functioning of your device to ensure it is working properly. You are scheduled for a device check from home on Tuesday, December 26th, 2017. You may send your transmission at any time that day. If you have a wireless device, the transmission will be sent automatically. After your physician reviews your transmission, you will receive a postcard with your next transmission date.  Dr Croitoru recommends that you schedule a follow-up appointment in 6 months with a pacemaker check. You will receive a reminder letter in the mail two months in advance. If you don't receive a letter, please call our office to schedule the follow-up appointment.  If you need a refill on your cardiac medications before your next appointment, please call your pharmacy. 
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
## 2015-10-25 LAB — CUP PACEART INCLINIC DEVICE CHECK
Battery Remaining Longevity: 108 mo
Implantable Lead Implant Date: 20060831
Implantable Lead Location: 753860
Implantable Lead Model: 5092
Lead Channel Setting Pacing Amplitude: 3.5 V
Lead Channel Setting Pacing Pulse Width: 0.4 ms
Lead Channel Setting Sensing Sensitivity: 2.8 mV
MDC IDC LEAD IMPLANT DT: 20060831
MDC IDC LEAD LOCATION: 753859
MDC IDC SESS DTM: 20171005095051

## 2016-01-10 ENCOUNTER — Telehealth: Payer: Self-pay | Admitting: Cardiovascular Disease

## 2016-01-10 NOTE — Telephone Encounter (Signed)
Pt's son called to make phys pacer ck with Dr. Salena Saner in March, however he has dialysis on Mon-Wed-and Fridays, can he be scheduled on a Tues or Friday and  call in industry?

## 2016-01-11 ENCOUNTER — Observation Stay (HOSPITAL_COMMUNITY)
Admission: EM | Admit: 2016-01-11 | Discharge: 2016-01-13 | Disposition: A | Discharge status: Home / Self Care | Payer: Medicare Other | Attending: Cardiovascular Disease | Admitting: Cardiovascular Disease

## 2016-01-11 ENCOUNTER — Encounter (HOSPITAL_COMMUNITY): Payer: Self-pay

## 2016-01-11 ENCOUNTER — Emergency Department (HOSPITAL_COMMUNITY): Payer: Medicare Other

## 2016-01-11 DIAGNOSIS — I132 Hypertensive heart and chronic kidney disease with heart failure and with stage 5 chronic kidney disease, or end stage renal disease: Secondary | ICD-10-CM | POA: Diagnosis not present

## 2016-01-11 DIAGNOSIS — I5032 Chronic diastolic (congestive) heart failure: Secondary | ICD-10-CM | POA: Diagnosis present

## 2016-01-11 DIAGNOSIS — R072 Precordial pain: Principal | ICD-10-CM | POA: Insufficient documentation

## 2016-01-11 DIAGNOSIS — I5033 Acute on chronic diastolic (congestive) heart failure: Secondary | ICD-10-CM | POA: Diagnosis not present

## 2016-01-11 DIAGNOSIS — Z950 Presence of cardiac pacemaker: Secondary | ICD-10-CM | POA: Diagnosis not present

## 2016-01-11 DIAGNOSIS — Z951 Presence of aortocoronary bypass graft: Secondary | ICD-10-CM

## 2016-01-11 DIAGNOSIS — M329 Systemic lupus erythematosus, unspecified: Secondary | ICD-10-CM | POA: Diagnosis present

## 2016-01-11 DIAGNOSIS — I2510 Atherosclerotic heart disease of native coronary artery without angina pectoris: Secondary | ICD-10-CM | POA: Insufficient documentation

## 2016-01-11 DIAGNOSIS — N186 End stage renal disease: Secondary | ICD-10-CM | POA: Diagnosis present

## 2016-01-11 DIAGNOSIS — Z953 Presence of xenogenic heart valve: Secondary | ICD-10-CM

## 2016-01-11 DIAGNOSIS — Z87891 Personal history of nicotine dependence: Secondary | ICD-10-CM | POA: Diagnosis not present

## 2016-01-11 DIAGNOSIS — R079 Chest pain, unspecified: Secondary | ICD-10-CM | POA: Diagnosis present

## 2016-01-11 DIAGNOSIS — I4821 Permanent atrial fibrillation: Secondary | ICD-10-CM | POA: Diagnosis present

## 2016-01-11 LAB — BASIC METABOLIC PANEL
ANION GAP: 12 (ref 5–15)
BUN: 71 mg/dL — ABNORMAL HIGH (ref 6–20)
CHLORIDE: 102 mmol/L (ref 101–111)
CO2: 28 mmol/L (ref 22–32)
Calcium: 9.4 mg/dL (ref 8.9–10.3)
Creatinine, Ser: 5.1 mg/dL — ABNORMAL HIGH (ref 0.61–1.24)
GFR calc non Af Amer: 9 mL/min — ABNORMAL LOW (ref >60)
GFR, EST AFRICAN AMERICAN: 11 mL/min — AB (ref >60)
Glucose, Bld: 116 mg/dL — ABNORMAL HIGH (ref 65–99)
Potassium: 4.1 mmol/L (ref 3.5–5.1)
Sodium: 142 mmol/L (ref 135–145)

## 2016-01-11 LAB — CBC
HCT: 26.7 % — ABNORMAL LOW (ref 39.0–52.0)
HCT: 25.8 % — ABNORMAL LOW (ref 39.0–52.0)
HEMOGLOBIN: 8.7 g/dL — AB (ref 13.0–17.0)
Hemoglobin: 8.4 g/dL — ABNORMAL LOW (ref 13.0–17.0)
MCH: 30.5 pg (ref 26.0–34.0)
MCH: 30.3 pg (ref 26.0–34.0)
MCHC: 32.6 g/dL (ref 30.0–36.0)
MCHC: 32.6 g/dL (ref 30.0–36.0)
MCV: 93.7 fL (ref 78.0–100.0)
MCV: 93.1 fL (ref 78.0–100.0)
Platelets: 112 10*3/uL — ABNORMAL LOW (ref 150–400)
Platelets: 104 10*3/uL — ABNORMAL LOW (ref 150–400)
RBC: 2.85 MIL/uL — ABNORMAL LOW (ref 4.22–5.81)
RBC: 2.77 MIL/uL — ABNORMAL LOW (ref 4.22–5.81)
RDW: 16.2 % — ABNORMAL HIGH (ref 11.5–15.5)
RDW: 16.2 % — ABNORMAL HIGH (ref 11.5–15.5)
WBC: 6.6 10*3/uL (ref 4.0–10.5)
WBC: 6.5 10*3/uL (ref 4.0–10.5)

## 2016-01-11 LAB — CREATININE, SERUM
Creatinine, Ser: 5.03 mg/dL — ABNORMAL HIGH (ref 0.61–1.24)
GFR calc Af Amer: 11 mL/min — ABNORMAL LOW (ref >60)
GFR calc non Af Amer: 9 mL/min — ABNORMAL LOW (ref >60)

## 2016-01-11 LAB — I-STAT TROPONIN, ED: Troponin i, poc: 0.04 ng/mL (ref 0.00–0.08)

## 2016-01-11 LAB — PROTIME-INR
INR: 1.1
Prothrombin Time: 14.2 seconds (ref 11.4–15.2)

## 2016-01-11 LAB — TROPONIN I
Troponin I: 0.05 ng/mL (ref <0.03)
Troponin I: 0.05 ng/mL (ref <0.03)

## 2016-01-11 MED ORDER — PREDNISONE 10 MG PO TABS
10.0000 mg | ORAL_TABLET | Freq: Every day | ORAL | Status: DC
  Administered 2016-01-12 – 2016-01-13 (×2): 10 mg via ORAL
  Filled 2016-01-11 (×2): qty 1

## 2016-01-11 MED ORDER — METOPROLOL TARTRATE 12.5 MG HALF TABLET
12.5000 mg | ORAL_TABLET | Freq: Two times a day (BID) | ORAL | Status: DC
  Filled 2016-01-11 (×2): qty 1

## 2016-01-11 MED ORDER — PENTAFLUOROPROP-TETRAFLUOROETH EX AERO: 1.0000 "application " | INHALATION_SPRAY | CUTANEOUS | Status: DC | PRN

## 2016-01-11 MED ORDER — PREDNISONE 10 MG PO TABS
10.0000 mg | ORAL_TABLET | Freq: Every day | ORAL | Status: DC
  Administered 2016-01-11 – 2016-01-12 (×2): 10 mg via ORAL
  Filled 2016-01-11 (×2): qty 1

## 2016-01-11 MED ORDER — NITROGLYCERIN 0.4 MG SL SUBL: 0.4000 mg | SUBLINGUAL_TABLET | SUBLINGUAL | Status: DC | PRN

## 2016-01-11 MED ORDER — NITROGLYCERIN 0.2 MG/HR TD PT24
0.2000 mg | MEDICATED_PATCH | TRANSDERMAL | Status: DC
  Administered 2016-01-12: 0.2 mg via TRANSDERMAL
  Filled 2016-01-11 (×3): qty 1

## 2016-01-11 MED ORDER — ONDANSETRON HCL 4 MG/2ML IJ SOLN
4.0000 mg | Freq: Four times a day (QID) | INTRAMUSCULAR | Status: DC | PRN
  Filled 2016-01-11: qty 2

## 2016-01-11 MED ORDER — SODIUM CHLORIDE 0.9 % IV SOLN: 100.0000 mL | INTRAVENOUS | Status: DC | PRN

## 2016-01-11 MED ORDER — ASPIRIN EC 81 MG PO TBEC
81.0000 mg | DELAYED_RELEASE_TABLET | Freq: Every day | ORAL | Status: DC
  Administered 2016-01-12 – 2016-01-13 (×2): 81 mg via ORAL
  Filled 2016-01-11 (×2): qty 1

## 2016-01-11 MED ORDER — LIDOCAINE-PRILOCAINE 2.5-2.5 % EX CREA: 1.0000 "application " | TOPICAL_CREAM | CUTANEOUS | Status: DC | PRN

## 2016-01-11 MED ORDER — HEPARIN SODIUM (PORCINE) 1000 UNIT/ML DIALYSIS: 1000.0000 [IU] | INTRAMUSCULAR | Status: DC | PRN

## 2016-01-11 MED ORDER — OMEGA-3-ACID ETHYL ESTERS 1 G PO CAPS
1.0000 g | ORAL_CAPSULE | Freq: Every day | ORAL | Status: DC
  Administered 2016-01-11 – 2016-01-13 (×3): 1 g via ORAL
  Filled 2016-01-11 (×3): qty 1

## 2016-01-11 MED ORDER — PANTOPRAZOLE SODIUM 40 MG PO TBEC
40.0000 mg | DELAYED_RELEASE_TABLET | Freq: Two times a day (BID) | ORAL | Status: DC
  Administered 2016-01-11 – 2016-01-13 (×4): 40 mg via ORAL
  Filled 2016-01-11 (×5): qty 1

## 2016-01-11 MED ORDER — LANTHANUM CARBONATE 500 MG PO CHEW
1000.0000 mg | CHEWABLE_TABLET | Freq: Three times a day (TID) | ORAL | Status: DC
  Administered 2016-01-11 – 2016-01-13 (×5): 1000 mg via ORAL
  Filled 2016-01-11 (×5): qty 2

## 2016-01-11 MED ORDER — OMEGA-3 FATTY ACIDS 1000 MG PO CAPS
1.0000 g | ORAL_CAPSULE | Freq: Every day | ORAL | Status: DC
Start: 2016-01-12 — End: 2016-01-11

## 2016-01-11 MED ORDER — PREDNISONE 10 MG PO TABS
10.0000 mg | ORAL_TABLET | Freq: Every day | ORAL | Status: AC
  Administered 2016-01-12: 10 mg via ORAL
  Filled 2016-01-11: qty 1

## 2016-01-11 MED ORDER — GABAPENTIN 300 MG PO CAPS
300.0000 mg | ORAL_CAPSULE | Freq: Every evening | ORAL | Status: DC
  Administered 2016-01-11 – 2016-01-12 (×2): 300 mg via ORAL
  Filled 2016-01-11 (×3): qty 1

## 2016-01-11 MED ORDER — PREDNISONE 10 MG (21) PO TBPK: 10.0000 mg | ORAL_TABLET | Freq: Four times a day (QID) | ORAL | Status: DC

## 2016-01-11 MED ORDER — ALTEPLASE 2 MG IJ SOLR: 2.0000 mg | Freq: Once | INTRAMUSCULAR | Status: DC | PRN

## 2016-01-11 MED ORDER — CYCLOSPORINE 0.05 % OP EMUL
1.0000 [drp] | Freq: Two times a day (BID) | OPHTHALMIC | Status: DC
  Administered 2016-01-11 – 2016-01-13 (×4): 1 [drp] via OPHTHALMIC
  Filled 2016-01-11 (×5): qty 1

## 2016-01-11 MED ORDER — HEPARIN SODIUM (PORCINE) 5000 UNIT/ML IJ SOLN
5000.0000 [IU] | Freq: Three times a day (TID) | INTRAMUSCULAR | Status: DC
  Administered 2016-01-11 – 2016-01-13 (×4): 5000 [IU] via SUBCUTANEOUS
  Filled 2016-01-11 (×4): qty 1

## 2016-01-11 MED ORDER — CALCIUM ACETATE (PHOS BINDER) 667 MG PO CAPS
667.0000 mg | ORAL_CAPSULE | Freq: Every morning | ORAL | Status: DC
  Administered 2016-01-11 – 2016-01-13 (×3): 667 mg via ORAL
  Filled 2016-01-11 (×3): qty 1

## 2016-01-11 MED ORDER — ROPINIROLE HCL 1 MG PO TABS
2.0000 mg | ORAL_TABLET | Freq: Three times a day (TID) | ORAL | Status: DC
  Administered 2016-01-11 – 2016-01-12 (×2): 2 mg via ORAL
  Filled 2016-01-11 (×3): qty 2

## 2016-01-11 MED ORDER — MIDODRINE HCL 5 MG PO TABS
2.5000 mg | ORAL_TABLET | Freq: Two times a day (BID) | ORAL | Status: DC
  Administered 2016-01-12 – 2016-01-13 (×3): 2.5 mg via ORAL
  Filled 2016-01-11 (×4): qty 1

## 2016-01-11 MED ORDER — ACETAMINOPHEN 325 MG PO TABS: 650.0000 mg | ORAL_TABLET | ORAL | Status: DC | PRN

## 2016-01-11 MED ORDER — LIDOCAINE HCL (PF) 1 % IJ SOLN: 5.0000 mL | INTRAMUSCULAR | Status: DC | PRN

## 2016-01-11 NOTE — ED Notes (Signed)
[facility] at [facility]

## 2016-01-11 NOTE — ED Triage Notes (Addendum)
Per pT, Pt was at home drying the dishes when he started to have left-sided chest pain. Pt reports it being a pressure. Reports the last two days having some times when his legs would give out on him. Pt has HX of Pacemaker. Pt is a dialysis patient MWF. Missed today due to coming in.

## 2016-01-11 NOTE — Procedures (Signed)
Patient was seen on dialysis and the procedure was supervised.  BFR 400  Via AVF BP is  138/73.   Patient appears to be tolerating treatment well  Nathaniel Steele A 01/11/2016

## 2016-01-11 NOTE — ED Notes (Signed)
This RN interrogated the pts medtronic pacemaker. Awaiting report.

## 2016-01-11 NOTE — H&P (Signed)
Patient ID: Rutherford Guysaul C Mcneece MRN: [id number], DOB/AGE: 80/09/1926   Admit date: 01/11/2016   Primary Physician: Delorse LekBURNETT,BRENT A, MD Primary Cardiologist: Dr Royann Shiversroitoru  HPI: 82 y/o male followed at the [facility] in [place] with a history of ESRD-on HD, CAD and AOV disease-s/p CABG and tissue AVR 2013, CAF- not a candidate for anticoagulation secondary to GI bleeding history, SSS s/p MDT PTVDP-last gen change 2015, and orthostatic hypotension limiting medical Rx.   The [facility] (did not get scheduled dialysis) with compaints of sudden Lt upper chest "sharp" pain. He denies any exacerbation with deep breathing, or movement. He did admit to a history of exertional discomfort between his shoulders and dyspnea for the past few weeks.    Problem List: Past Medical History:  Diagnosis Date  . Acute on chronic diastolic CHF (congestive heart failure), NYHA class 3 (HCC) 12/07/2013  . Anemia   . Arthritis   . Benign prostatic hypertrophy   . Blood dyscrasia    one time had low platlet count  . Cellulitis   . Chronic kidney disease    not on dialysis yet  . Claudication (HCC) 02/19/2012   LE doppler no evidence of arterial insufficiency, lower extremities demonstrate normal values  w/ no evidence of insufficiency  . Constipation   . Coronary artery disease 05/14/2010   stress test - no scintigraphic evidence of inducible myocardial ischemia,; normal study  . Dysrhythmia    atrial fib  . Hypertension   . Lupus nephritis (HCC)   . Pacemaker 09/19/2004   implanted  . Pacemaker   . Pericardial effusion 03/12/2011   echo EF 55-60%  . Restless leg syndrome   . Shortness of breath with exertion  . Shoulder joint pain   . SSS (sick sinus syndrome) (HCC) 08/27/2011   echo EF >55%, severe LAE, moderate RAE, tissue AVR w/ gradients 35 and 17mmHg    Past Surgical History:  Procedure Laterality Date  . AORTIC VALVE REPLACEMENT  02/24/2011   pericardial tissue valve [facility]  . APPENDECTOMY    . AV FISTULA PLACEMENT  02/24/2011  . CARDIAC CATHETERIZATION  02/13/2011   mod/severe aortic valve stenosis w peak to peak gradient 25-32 mmHg,  70% stenosis LAD, 30-40%proximal followed by 90% sstenosis in RCA prior to anterior RV margin branch  . CARDIOVERSION N/A 12/09/2013   Procedure: CARDIOVERSION;  Surgeon: Thurmon FairMihai Croitoru, MD;  Location: [facility] ENDOSCOPY;  Service: Cardiovascular;  Laterality: N/A;  . CORONARY ARTERY BYPASS GRAFT  02/24/2011   LIMA to LAD, SVG to distal RCA  . ESOPHAGOGASTRODUODENOSCOPY N/A 06/21/2013   Procedure: ESOPHAGOGASTRODUODENOSCOPY (EGD);  Surgeon: Theda BelfastPatrick D Hung, MD;  [facility];  Service: Endoscopy;  Laterality: N/A;  . GIVENS CAPSULE STUDY N/A 06/21/2013   Procedure: GIVENS CAPSULE STUDY;  Surgeon: Theda BelfastPatrick D Hung, MD;  [facility];  Service: Endoscopy;  Laterality: N/A;  . GIVENS CAPSULE STUDY N/A 03/25/2014   Procedure: GIVENS CAPSULE STUDY;  Surgeon: Theda BelfastPatrick D Hung, MD;  [facility];  Service: Endoscopy;  Laterality: N/A;  . HERNIA REPAIR    . LEFT AND RIGHT HEART CATHETERIZATION WITH CORONARY ANGIOGRAM N/A 02/13/2011   Procedure: LEFT AND RIGHT HEART CATHETERIZATION WITH CORONARY ANGIOGRAM;  Surgeon: Lennette Biharihomas A Kelly, MD;  [facility];  Service: Cardiovascular;  Laterality: N/A;  . LUMBAR LAMINECTOMY/DECOMPRESSION MICRODISCECTOMY Right 04/16/2012   Procedure: DECOMPRESSIVE L4 - L5/ MICRODISCECTOMY ON THE RIGHT 1 LEVEL;  Surgeon: Jacki Conesonald A Gioffre, MD;  Location: [facility] ORS;  Service: Orthopedics;  Laterality: Right;  . PACEMAKER INSERTION  2006   Medtronic EnRhythm  . PERMANENT PACEMAKER GENERATOR CHANGE N/A 05/10/2013   Procedure: PERMANENT PACEMAKER GENERATOR CHANGE;  Surgeon: Thurmon FairMihai Croitoru, MD;  Location: [facility] CATH LAB;  Service: Cardiovascular;  Laterality: N/A;     Allergies:  Allergies  Allergen Reactions  . Rosuvastatin Calcium Other (See Comments)    myalgias  . Statins Other (See Comments)     myalgias  . Aspirin Hives    Patient states that he can take the enteric coated but not the uncoated  . Feraheme [Ferumoxytol] Rash and Other (See Comments)    Abdominal pain  . Penicillins Hives     Home Medications Prior to Admission medications   Medication Sig Start Date End Date Taking? Authorizing Provider  calcium acetate (PHOSLO) 667 MG capsule Take 667 mg by mouth every morning.    Yes Historical Provider, MD  Cyanocobalamin (VITAMIN B 12 PO) Take 1 tablet by mouth daily.   Yes Historical Provider, MD  cycloSPORINE (RESTASIS) 0.05 % ophthalmic emulsion Place 1 drop into both eyes 2 (two) times daily.    Yes Historical Provider, MD  fish oil-omega-3 fatty acids 1000 MG capsule Take 1 g by mouth daily.    Yes Historical Provider, MD  gabapentin (NEURONTIN) 300 MG capsule Take 300 mg by mouth every evening.   Yes Historical Provider, MD  lanthanum (FOSRENOL) 1000 MG chewable tablet Chew 1,000 mg by mouth 3 (three) times daily with meals.   Yes Historical Provider, MD  midodrine (PROAMATINE) 2.5 MG tablet Take 2.5 mg by mouth 2 (two) times daily with a meal. TAKE ONE TABLET IN THE am AND SECOND DOSE 6 HOURS LATER   Yes Historical Provider, MD  Multiple Vitamins-Minerals (OCUVITE PRESERVISION PO) Take 1 tablet by mouth daily.   Yes Historical Provider, MD  pantoprazole (PROTONIX) 40 MG tablet Take 1 tablet (40 mg total) by mouth 2 (two) times daily. 03/26/14  Yes Belkys A Regalado, MD  predniSONE (STERAPRED UNI-PAK 21 TAB) 10 MG (21) TBPK tablet Take 10 mg by mouth taper from 4 doses each day to 1 dose and stop.   Yes Historical Provider, MD  rOPINIRole (REQUIP) 2 MG tablet Take 1 tablet (2 mg total) by mouth 2 (two) times daily. Patient taking differently: Take 2 mg by mouth 3 (three) times daily.  03/26/14  Yes Alba CoryBelkys A Regalado, MD     Family History  Problem Relation Age of Onset  . Hypertension Mother   . Kidney disease Brother      Social History   Social History  . Marital  status: Married    Spouse name: [name]  . Number of children: 1  . Years of education: 6   Occupational History  . Not on file.   Social History Main Topics  . Smoking status: Former Smoker    Types: Cigarettes    Quit date: 02/11/1957  . Smokeless tobacco: Never Used  . Alcohol use No  . Drug use: No  . Sexual activity: Not on file   Other Topics Concern  . Not on file   Social History Narrative   Patient is married ([name]).   Patient has one child.   Patient does not drink caffeine.   Patient is right-handed.   Patient is retired.   Patient has a 6th grade education.           Review of Systems: General: negative for chills, fever,  night sweats or weight changes.  Cardiovascular: negative for palpitations, paroxysmal nocturnal dyspnea  HEENT: negative for any visual disturbances, blindness, glaucoma Dermatological: negative for rash Respiratory: negative for cough, hemoptysis, or wheezing Urologic: negative for hematuria or dysuria Abdominal: negative for nausea, vomiting, diarrhea, bright red blood per rectum, melena, or hematemesis Neurologic: negative for visual changes, syncope, or dizziness Musculoskeletal: negative for back pain, joint pain, or swelling Psych: cooperative and appropriate All other systems reviewed and are otherwise negative except as noted above.  Physical Exam: Blood pressure 125/73, pulse 73, temperature 98.5 F (36.9 C), temperature source Oral, resp. rate 17, height 5' 7.5" (1.715 m), weight 157 lb (71.2 kg), SpO2 99 %.  General appearance: alert, cooperative, appears stated age and no distress Neck: no JVD and bilateral carotid bruits Lungs: clear Heart: regular rate and rhythm and 2/6 systolic murmur, loudest at AOV Abdomen: soft, non-tender; bowel sounds normal; no masses,  no organomegaly Extremities: bilateral trace edema, intact posterior tibial pulses Pulses: 2+ and symmetric Skin: Skin color, texture, turgor normal. No rashes or  lesions Neurologic: Grossly normal, resting tremor noted   Labs:   Results for orders placed or performed during the hospital encounter of 01/11/16 (from the past 24 hour(s))  Basic metabolic panel     Status: Abnormal   Collection Time: 01/11/16 10:37 AM  Result Value Ref Range   Sodium 142 135 - 145 mmol/L   Potassium 4.1 3.5 - 5.1 mmol/L   Chloride 102 101 - 111 mmol/L   CO2 28 22 - 32 mmol/L   Glucose, Bld 116 (H) 65 - 99 mg/dL   BUN 71 (H) 6 - 20 mg/dL   Creatinine, Ser 1.61 (H) 0.61 - 1.24 mg/dL   Calcium 9.4 8.9 - 09.6 mg/dL   GFR calc non Af Amer 9 (L) >60 mL/min   GFR calc Af Amer 11 (L) >60 mL/min   Anion gap 12 5 - 15  CBC     Status: Abnormal   Collection Time: 01/11/16 10:37 AM  Result Value Ref Range   WBC 6.6 4.0 - 10.5 K/uL   RBC 2.85 (L) 4.22 - 5.81 MIL/uL   Hemoglobin 8.7 (L) 13.0 - 17.0 g/dL   HCT 04.5 (L) 40.9 - 81.1 %   MCV 93.7 78.0 - 100.0 fL   MCH 30.5 26.0 - 34.0 pg   MCHC 32.6 30.0 - 36.0 g/dL   RDW 91.4 (H) 78.2 - 95.6 %   Platelets 112 (L) 150 - 400 K/uL     Radiology/Studies: Dg Chest 2 View  Result Date: 01/11/2016 CLINICAL DATA:  Chest pain with lower extremity weakness EXAM: CHEST  2 VIEW COMPARISON:  January 08, 2016 FINDINGS: There is no appreciable edema or consolidation. Heart is enlarged with pulmonary vascular within normal limits. Pacemaker leads are attached to the right atrium and right ventricle. There is atherosclerotic calcification aorta. No adenopathy. Patient is status post coronary artery bypass grafting and aortic valve replacement. Bones are osteoporotic. There is anterior wedging of the L1 vertebral body. IMPRESSION: Stable cardiomegaly. Aortic atherosclerosis. No edema or consolidation. Bones osteoporotic. Electronically Signed   By: Bretta Bang III M.D.   On: 01/11/2016 11:22    EKG: paced  ASSESSMENT AND PLAN:  Principal Problem:   Chest pain Active Problems:   S/P CABG x 2: (LIMA-LAD  SVG - RCA)   End-stage  renal disease (ESRD) (HCC)   Chronic diastolic heart failure (HCC)   History of aortic valve replacement with bioprosthetic valve  SLE (systemic lupus erythematosus) (HCC)   Permanent atrial fibrillation [facility])   Pacemaker, MDT implanted 08/06, generator change April 2015   PLAN: Will discuss with MD, may be best to admit, add ? 0.2mg  Nitro Dur patch. If he stays we'll need to contact renal service for dialysis.    Signed, Corine Shelter, PA-C 01/11/2016, 12:17 PM [phone]  As above, patient seen and examined. Briefly he is an 82 year old male with past medical history of end-stage renal disease dialysis dependent, prior aortic valve replacement with tissue valve, coronary artery disease status post coronary artery bypass graft, permanent atrial flutter not on anticoagulation secondary to GI bleeding, sick sinus syndrome status post pacemaker placement and orthostatic hypotension with chest and back pain. Echocardiogram March 2016 showed normal LV function, restrictive filling, bioprosthetic aortic valve with mean gradient 16 mmHg, moderate mitral regurgitation, mild mitral stenosis, biatrial enlargement and moderate tricuspid regurgitation. Patient presents with complaints of dyspnea on exertion for 2 weeks. He also has pain in his shoulders and mid back with ambulation relieved with rest. He had a sharp pain in his chest today lasting approximately 2 minutes. No radiation or associated symptoms. He presented for further evaluation. Electrocardiogram shows ventricular pacing with underlying atrial flutter.  1 exertional back pain-patient's chest pain is atypical and unlikely to be cardiac. I am more concerned about his exertional dyspnea and exertional back and shoulder pain. However he apparently has had significant GI bleeding in the past and is not even taking aspirin. Therefore if he had PCI it would be difficult as he would require dual antiplatelet therapy. We will cycle enzymes. Check  echocardiogram to assess mitral regurgitation, mitral stenosis and aortic valve replacement. Would arrange Lexiscan nuclear study tomorrow morning. If low risk would treat medically. If high risk he may need to be challenged with dual antiplatelet therapy and if he tolerates proceed with catheterization to assess lesion possibly amenable to PCI. We will add enteric-coated aspirin 81 mg daily. He is not a great candidate for antianginals given history of orthostatic hypotension. He is on midodrine. I will try low-dose metoprolol to see if he tolerates.  2 end-stage renal disease-we will contact nephrology to arrange dialysis. He is due today.  3 coronary artery disease-we will add aspirin 81 mg daily. He is intolerant to statins.  4 orthostatic hypotension-continue midodrine.  5 permanent pacemaker  6 s/p AVR.  7 Permanent atrial flutter-patient is felt to high risk for anticoagulation given history of GI bleeding. CHADSvasc 5.  Olga Millers, MD

## 2016-01-11 NOTE — Progress Notes (Signed)
CCDM contacted RN at 2146 about 15 beats of Vtach, RN checked on pt and was asymptomatic. BP was also  83/53. Pt states that he runs that low after diaylsis. Notified MD, New orders to hold Nitrodur patch and get a BNP and magnesium lab. Will continue to monitor.

## 2016-01-11 NOTE — Consult Note (Signed)
Referring Provider: No ref. provider found Primary Care Physician:  Delorse Lek, MD Primary Nephrologist:  Lenn Sink  Reason for Consultation:  Medical management of ESRD  Anemia  Secondary Hyperkalemia    HPI: ESRD  MWF dialysis patient followed at [facility]  Has a history of lupus   Has a history of coronary artery disease and was admitted with chest pain  S/p CABG and AVR He was in the ER and so missed dialysis today  Past Medical History:  Diagnosis Date  . Acute on chronic diastolic CHF (congestive heart failure), NYHA class 3 (HCC) 12/07/2013  . Anemia   . Arthritis   . Benign prostatic hypertrophy   . Blood dyscrasia    one time had low platlet count  . Cellulitis   . Chronic kidney disease    not on dialysis yet  . Claudication (HCC) 02/19/2012   LE doppler no evidence of arterial insufficiency, lower extremities demonstrate normal values  w/ no evidence of insufficiency  . Constipation   . Coronary artery disease 05/14/2010   stress test - no scintigraphic evidence of inducible myocardial ischemia,; normal study  . Dysrhythmia    atrial fib  . Hypertension   . Lupus nephritis (HCC)   . Pacemaker 09/19/2004   implanted  . Pacemaker   . Pericardial effusion 03/12/2011   echo EF 55-60%  . Restless leg syndrome   . Shortness of breath with exertion  . Shoulder joint pain   . SSS (sick sinus syndrome) (HCC) 08/27/2011   echo EF >55%, severe LAE, moderate RAE, tissue AVR w/ gradients 35 and    Past Surgical History:  Procedure Laterality Date  . AORTIC VALVE REPLACEMENT  02/24/2011   pericardial tissue valve [facility] Ease  . APPENDECTOMY    . AV FISTULA PLACEMENT  02/24/2011  . CARDIAC CATHETERIZATION  02/13/2011   mod/severe aortic valve stenosis w peak to peak gradient 25-32 mmHg,  70% stenosis LAD, 30-40%proximal followed by 90% sstenosis in RCA prior to anterior RV margin branch  . CARDIOVERSION N/A 12/09/2013   Procedure: CARDIOVERSION;   Surgeon: Thurmon Fair, MD;  Location: [facility] ENDOSCOPY;  Service: Cardiovascular;  Laterality: N/A;  . CORONARY ARTERY BYPASS GRAFT  02/24/2011   LIMA to LAD, SVG to distal RCA  . ESOPHAGOGASTRODUODENOSCOPY N/A 06/21/2013   Procedure: ESOPHAGOGASTRODUODENOSCOPY (EGD);  Surgeon: Theda Belfast, MD;  Location: [facility] ENDOSCOPY;  Service: Endoscopy;  Laterality: N/A;  . GIVENS CAPSULE STUDY N/A 06/21/2013   Procedure: GIVENS CAPSULE STUDY;  Surgeon: Theda Belfast, MD;  Location: [facility] ENDOSCOPY;  Service: Endoscopy;  Laterality: N/A;  . GIVENS CAPSULE STUDY N/A 03/25/2014   Procedure: GIVENS CAPSULE STUDY;  Surgeon: Theda Belfast, MD;  Location: [facility] ENDOSCOPY;  Service: Endoscopy;  Laterality: N/A;  . HERNIA REPAIR    . LEFT AND RIGHT HEART CATHETERIZATION WITH CORONARY ANGIOGRAM N/A 02/13/2011   Procedure: LEFT AND RIGHT HEART CATHETERIZATION WITH CORONARY ANGIOGRAM;  Surgeon: Lennette Bihari, MD;  Location: [facility] CATH LAB;  Service: Cardiovascular;  Laterality: N/A;  . LUMBAR LAMINECTOMY/DECOMPRESSION MICRODISCECTOMY Right 04/16/2012   Procedure: DECOMPRESSIVE L4 - L5/ MICRODISCECTOMY ON THE RIGHT 1 LEVEL;  Surgeon: Jacki Cones, MD;  Location: [facility] ORS;  Service: Orthopedics;  Laterality: Right;  . PACEMAKER INSERTION  2006   Medtronic EnRhythm  . PERMANENT PACEMAKER GENERATOR CHANGE N/A 05/10/2013   Procedure: PERMANENT PACEMAKER GENERATOR CHANGE;  Surgeon: Thurmon Fair, MD;  Location: [facility] CATH LAB;  Service: Cardiovascular;  Laterality: N/A;  Prior to Admission medications   Medication Sig Start Date End Date Taking? Authorizing Provider  calcium acetate (PHOSLO) 667 MG capsule Take 667 mg by mouth every morning.    Yes Historical Provider, MD  Cyanocobalamin (VITAMIN B 12 PO) Take 1 tablet by mouth daily.   Yes Historical Provider, MD  cycloSPORINE (RESTASIS) 0.05 % ophthalmic emulsion Place 1 drop into both eyes 2 (two) times daily.    Yes Historical Provider, MD  fish oil-omega-3 fatty acids 1000 MG  capsule Take 1 g by mouth daily.    Yes Historical Provider, MD  gabapentin (NEURONTIN) 300 MG capsule Take 300 mg by mouth every evening.   Yes Historical Provider, MD  lanthanum (FOSRENOL) 1000 MG chewable tablet Chew 1,000 mg by mouth 3 (three) times daily with meals.   Yes Historical Provider, MD  midodrine (PROAMATINE) 2.5 MG tablet Take 2.5 mg by mouth 2 (two) times daily with a meal. TAKE ONE TABLET IN THE am AND SECOND DOSE 6 HOURS LATER   Yes Historical Provider, MD  Multiple Vitamins-Minerals (OCUVITE PRESERVISION PO) Take 1 tablet by mouth daily.   Yes Historical Provider, MD  pantoprazole (PROTONIX) 40 MG tablet Take 1 tablet (40 mg total) by mouth 2 (two) times daily. 03/26/14  Yes Belkys A Regalado, MD  predniSONE (STERAPRED UNI-PAK 21 TAB) 10 MG (21) TBPK tablet Take 10 mg by mouth taper from 4 doses each day to 1 dose and stop.   Yes Historical Provider, MD  rOPINIRole (REQUIP) 2 MG tablet Take 1 tablet (2 mg total) by mouth 2 (two) times daily. Patient taking differently: Take 2 mg by mouth 3 (three) times daily.  03/26/14  Yes Alba CoryBelkys A Regalado, MD    Current Facility-Administered Medications  Medication Dose Route Frequency Provider Last Rate Last Dose  . acetaminophen (TYLENOL) tablet 650 mg  650 mg Oral Q4H PRN Abelino DerrickLuke K Kilroy, PA-C      . [START ON 01/12/2016] aspirin EC tablet 81 mg  81 mg Oral Daily Luke K Kilroy, PA-C      . calcium acetate (PHOSLO) capsule 667 mg  667 mg Oral q morning - 10a Luke K Kilroy, PA-C      . cycloSPORINE (RESTASIS) 0.05 % ophthalmic emulsion 1 drop  1 drop Both Eyes BID Luke K Kilroy, PA-C      . [START ON 01/12/2016] fish oil-omega-3 fatty acids capsule 1 g  1 g Oral Daily Luke K Kilroy, PA-C      . gabapentin (NEURONTIN) capsule 300 mg  300 mg Oral QPM Luke K Kilroy, PA-C      . heparin injection 5,000 Units  5,000 Units Subcutaneous Q8H Luke K Kilroy, PA-C      . lanthanum [facility](FOSRENOL) chewable tablet 1,000 mg  1,000 mg Oral TID WC Luke K Kilroy,  PA-C      . metoprolol tartrate (LOPRESSOR) tablet 12.5 mg  12.5 mg Oral BID Luke K Kilroy, PA-C      . [START ON 01/12/2016] midodrine (PROAMATINE) tablet 2.5 mg  2.5 mg Oral BID WC Luke K Kilroy, PA-C      . nitroGLYCERIN (NITRODUR - Dosed in mg/24 hr) patch 0.2 mg  0.2 mg Transdermal Daily Luke K Kilroy, PA-C      . nitroGLYCERIN (NITROSTAT) SL tablet 0.4 mg  0.4 mg Sublingual Q5 Min x 3 PRN Luke K Kilroy, PA-C      . ondansetron [facility](ZOFRAN) injection 4 mg  4 mg Intravenous Q6H PRN Abelino DerrickLuke K Kilroy, PA-C      .  pantoprazole (PROTONIX) EC tablet 40 mg  40 mg Oral BID Luke K Kilroy, PA-C      . rOPINIRole (REQUIP) tablet 2 mg  2 mg Oral TID Abelino Derrick, PA-C       Current Outpatient Prescriptions  Medication Sig Dispense Refill  . calcium acetate (PHOSLO) 667 MG capsule Take 667 mg by mouth every morning.     . Cyanocobalamin (VITAMIN B 12 PO) Take 1 tablet by mouth daily.    . cycloSPORINE (RESTASIS) 0.05 % ophthalmic emulsion Place 1 drop into both eyes 2 (two) times daily.     . fish oil-omega-3 fatty acids 1000 MG capsule Take 1 g by mouth daily.     Marland Kitchen gabapentin (NEURONTIN) 300 MG capsule Take 300 mg by mouth every evening.    Marland Kitchen lanthanum (FOSRENOL) 1000 MG chewable tablet Chew 1,000 mg by mouth 3 (three) times daily with meals.    . midodrine (PROAMATINE) 2.5 MG tablet Take 2.5 mg by mouth 2 (two) times daily with a meal. TAKE ONE TABLET IN THE am AND SECOND DOSE 6 HOURS LATER    . Multiple Vitamins-Minerals (OCUVITE PRESERVISION PO) Take 1 tablet by mouth daily.    . pantoprazole (PROTONIX) 40 MG tablet Take 1 tablet (40 mg total) by mouth 2 (two) times daily. 60 tablet 0  . predniSONE (STERAPRED UNI-PAK 21 TAB) 10 MG (21) TBPK tablet Take 10 mg by mouth taper from 4 doses each day to 1 dose and stop.    Marland Kitchen rOPINIRole (REQUIP) 2 MG tablet Take 1 tablet (2 mg total) by mouth 2 (two) times daily. (Patient taking differently: Take 2 mg by mouth 3 (three) times daily. ) 30 tablet 0     Allergies as of 01/11/2016 - Review Complete 01/11/2016  Allergen Reaction Noted  . Rosuvastatin calcium Other (See Comments) 02/22/2013  . Statins Other (See Comments) 02/22/2013  . Aspirin Nausea Only 01/31/2011  . Feraheme [ferumoxytol] Rash and Other (See Comments) 04/04/2011  . Penicillins Hives 01/31/2011    Family History  Problem Relation Age of Onset  . Hypertension Mother   . Kidney disease Brother     Social History   Social History  . Marital status: Married    Spouse name: [name]  . Number of children: 1  . Years of education: 6   Occupational History  . Not on file.   Social History Main Topics  . Smoking status: Former Smoker    Types: Cigarettes    Quit date: 02/11/1957  . Smokeless tobacco: Never Used  . Alcohol use No  . Drug use: No  . Sexual activity: Not on file   Other Topics Concern  . Not on file   Social History Narrative   Patient is married ([name]).   Patient has one child.   Patient does not drink caffeine.   Patient is right-handed.   Patient is retired.   Patient has a 6th grade education.          Review of Systems: Gen: Denies any fever, chills, sweats, anorexia, fatigue, weakness, malaise, weight loss, and sleep disorder HEENT: No visual complaints, No history of Retinopathy. Normal external appearance No Epistaxis or Sore throat. No sinusitis.   CV:  + chest pain, angina, palpitations, syncope, orthopnea, PND, peripheral edema, and claudication. Resp: Denies dyspnea at rest, dyspnea with exercise, cough, sputum, wheezing, coughing up blood, and pleurisy. GI: Denies vomiting blood, jaundice, and fecal incontinence.   Denies dysphagia or odynophagia. GU : Denies  urinary burning, blood in urine, urinary frequency, urinary hesitancy, nocturnal urination, and urinary incontinence.  No renal calculi. MS: Denies joint pain, limitation of movement, and swelling, stiffness, low back pain, extremity pain. Denies muscle weakness,  cramps, atrophy.  No use of non steroidal antiinflammatory drugs. Derm: Denies rash, itching, dry skin, hives, moles, warts, or unhealing ulcers.  Psych: Denies depression, anxiety, memory loss, suicidal ideation, hallucinations, paranoia, and confusion. Heme: Denies bruising, bleeding, and enlarged lymph nodes. Neuro: No headache.  No diplopia. No dysarthria.  No dysphasia.  No history of CVA.  No Seizures. No paresthesias.  No weakness. Endocrine No DM.  No Thyroid disease.  No Adrenal disease.  Physical Exam: Vital signs in last 24 hours: Temp:  [98.5 F (36.9 C)] 98.5 F (36.9 C) (12/22 1033) Pulse Rate:  [72-85] 72 (12/22 1315) Resp:  [14-20] 19 (12/22 1315) BP: (117-145)/(60-77) 122/61 (12/22 1315) SpO2:  [93 %-99 %] 99 % (12/22 1315) Weight:  [71.2 kg (157 lb)] 71.2 kg (157 lb) (12/22 1034)   General:   Alert,  Well-developed, well-nourished, pleasant and cooperative in NAD Head:  Normocephalic and atraumatic. Eyes:  Sclera clear, no icterus.   Conjunctiva pink. Ears:  Normal auditory acuity. Nose:  No deformity, discharge,  or lesions. Mouth:  No deformity or lesions, dentition normal. Neck:  Supple; no masses or thyromegaly. JVP not elevated Lungs:  Clear throughout to auscultation.   No wheezes, crackles, or rhonchi. No acute distress. Heart:  Regular rate and rhythm; no murmurs, clicks, rubs,  or gallops. Abdomen:  Soft, nontender and nondistended. No masses, hepatosplenomegaly or hernias noted. Normal bowel sounds, without guarding, and without rebound.   Msk:  Symmetrical without gross deformities. Normal posture. Pulses:  No carotid, renal, femoral bruits. DP and PT symmetrical and equal Extremities:  Without clubbing or edema. Neurologic:  Alert and  oriented x4;  grossly normal neurologically. Skin:  Intact without significant lesions or rashes. Cervical Nodes:  No significant cervical adenopathy. Psych:  Alert and cooperative. Normal mood and  affect.  Intake/Output from previous day: No intake/output data recorded. Intake/Output this shift: No intake/output data recorded.  Lab Results:  Recent Labs  01/11/16 1037  WBC 6.6  HGB 8.7*  HCT 26.7*  PLT 112*   BMET  Recent Labs  01/11/16 1037  NA 142  K 4.1  CL 102  CO2 28  GLUCOSE 116*  BUN 71*  CREATININE 5.10*  CALCIUM 9.4   LFT No results for input(s): PROT, ALBUMIN, AST, ALT, ALKPHOS, BILITOT, BILIDIR, IBILI in the last 72 hours. PT/INR No results for input(s): LABPROT, INR in the last 72 hours. Hepatitis Panel No results for input(s): HEPBSAG, HCVAB, HEPAIGM, HEPBIGM in the last 72 hours.  Studies/Results: Dg Chest 2 View  Result Date: 01/11/2016 CLINICAL DATA:  Chest pain with lower extremity weakness EXAM: CHEST  2 VIEW COMPARISON:  January 08, 2016 FINDINGS: There is no appreciable edema or consolidation. Heart is enlarged with pulmonary vascular within normal limits. Pacemaker leads are attached to the right atrium and right ventricle. There is atherosclerotic calcification aorta. No adenopathy. Patient is status post coronary artery bypass grafting and aortic valve replacement. Bones are osteoporotic. There is anterior wedging of the L1 vertebral body. IMPRESSION: Stable cardiomegaly. Aortic atherosclerosis. No edema or consolidation. Bones osteoporotic. Electronically Signed   By: Bretta BangWilliam  Woodruff III M.D.   On: 01/11/2016 11:22    Assessment/Plan:  ESRD- plan dialysis today MWF    ANEMIA- stable no ESA  MBD- resume  vitamin D and binders  HTN/VOL- chronic hypotension on midodrine  Chest pain  -- work up per cardiology   LOS: 0 Juliano Mceachin W @TODAY @1 :22 PM

## 2016-01-11 NOTE — ED Provider Notes (Signed)
[facility] DEPT Provider Note   CSN: [id number] Arrival date & time: 01/11/16  1024     History   Chief Complaint Chief Complaint  Patient presents with  . Chest Pain    HPI Nathaniel Steele is a 82 y.o. male.  HPI For several days, the patient has noted increased shortness of breath and fatigue. He has noticed aching and heaviness across the back of his shoulders. Today he got several sharp chest pains while doing the dishes. Patient took a 325 mg aspirin today. He was supposed to go to dialysis but due to his symptoms of chest pain he came to the emergency department. He denies fevers, chills, cough, sputum production. He denies swelling or pain in his calves. Past Medical History:  Diagnosis Date  . Acute on chronic diastolic CHF (congestive heart failure), NYHA class 3 (HCC) 12/07/2013  . Anemia   . Arthritis   . Benign prostatic hypertrophy   . Blood dyscrasia    one time had low platlet count  . Cellulitis   . Chronic kidney disease    not on dialysis yet  . Claudication (HCC) 02/19/2012   LE doppler no evidence of arterial insufficiency, lower extremities demonstrate normal values  w/ no evidence of insufficiency  . Constipation   . Coronary artery disease 05/14/2010   stress test - no scintigraphic evidence of inducible myocardial ischemia,; normal study  . Dysrhythmia    atrial fib  . Hypertension   . Lupus nephritis (HCC)   . Pacemaker 09/19/2004   implanted  . Pacemaker   . Pericardial effusion 03/12/2011   echo EF 55-60%  . Restless leg syndrome   . Shortness of breath with exertion  . Shoulder joint pain   . SSS (sick sinus syndrome) (HCC) 08/27/2011   echo EF >55%, severe LAE, moderate RAE, tissue AVR w/ gradients 35 and    Patient Active Problem List   Diagnosis Date Noted  . Chest pain with moderate risk of acute coronary syndrome 01/11/2016  . Hypotension (arterial) 10/16/2015  . Chronic diastolic heart failure (HCC) 01/09/2015  . ESRD  (end stage renal disease) (HCC) 01/09/2015  . History of aortic valve replacement with bioprosthetic valve 01/09/2015  . Absolute anemia   . Chest pain 03/24/2014  . Anemia 03/17/2014  . Severe anemia 03/17/2014  . Acute renal failure syndrome (HCC)   . CHF exacerbation (HCC)   . ARF (acute renal failure) (HCC) 12/23/2013  . Atrial flutter (HCC) 12/09/2013  . Acute on chronic diastolic CHF (congestive heart failure), NYHA class 3 (HCC) 12/07/2013  . Symptomatic anemia 06/21/2013  . Guaiac positive stools 06/21/2013  . GI bleed 06/21/2013  . SSS (sick sinus syndrome) (HCC) 05/10/2013  . Symptomatic bradycardia 05/10/2013  . Pacemaker battery depletion 05/10/2013  . Hematoma of right lower extremity 05/08/2013  . Warfarin-induced coagulopathy (HCC) 05/08/2013  . Hematoma of lower limb 05/08/2013  . Dyspnea, began 04/19/13 04/25/2013  . Epigastric discomfort/chest discomfort 04/25/2013  . RLS (restless legs syndrome) 12/01/2012  . Unspecified hereditary and idiopathic peripheral neuropathy 12/01/2012  . Tobacco abuse 11/30/2012  . Spinal stenosis, lumbar region, with neurogenic claudication 04/16/2012  . Permanent atrial fibrillation (HCC) 02/26/2011  . Pacemaker, MDT implanted 08/06, generator change April 2015 02/26/2011  . Acute post-hemorrhagic anemia 02/26/2011  . Respiratory failure, hypoxia post op, on bipap currently 02/26/2011  . S/P CABG x 2: (LIMA-LAD  SVG - RCA) 02/25/2011  . End-stage renal disease (ESRD) (HCC) 02/12/2011    Class:  Chronic  . SLE (systemic lupus erythematosus) (HCC) 02/12/2011    Past Surgical History:  Procedure Laterality Date  . AORTIC VALVE REPLACEMENT  02/24/2011   pericardial tissue valve [facility] Ease  . APPENDECTOMY    . AV FISTULA PLACEMENT  02/24/2011  . CARDIAC CATHETERIZATION  02/13/2011   mod/severe aortic valve stenosis w peak to peak gradient 25-32 mmHg,  70% stenosis LAD, 30-40%proximal followed by 90% sstenosis in RCA prior to  anterior RV margin branch  . CARDIOVERSION N/A 12/09/2013   Procedure: CARDIOVERSION;  Surgeon: Thurmon Fair, MD;  Location: [facility] ENDOSCOPY;  Service: Cardiovascular;  Laterality: N/A;  . CORONARY ARTERY BYPASS GRAFT  02/24/2011   LIMA to LAD, SVG to distal RCA  . ESOPHAGOGASTRODUODENOSCOPY N/A 06/21/2013   Procedure: ESOPHAGOGASTRODUODENOSCOPY (EGD);  Surgeon: Theda Belfast, MD;  Location: [facility] ENDOSCOPY;  Service: Endoscopy;  Laterality: N/A;  . GIVENS CAPSULE STUDY N/A 06/21/2013   Procedure: GIVENS CAPSULE STUDY;  Surgeon: Theda Belfast, MD;  Location: [facility] ENDOSCOPY;  Service: Endoscopy;  Laterality: N/A;  . GIVENS CAPSULE STUDY N/A 03/25/2014   Procedure: GIVENS CAPSULE STUDY;  Surgeon: Theda Belfast, MD;  Location: [facility] ENDOSCOPY;  Service: Endoscopy;  Laterality: N/A;  . HERNIA REPAIR    . LEFT AND RIGHT HEART CATHETERIZATION WITH CORONARY ANGIOGRAM N/A 02/13/2011   Procedure: LEFT AND RIGHT HEART CATHETERIZATION WITH CORONARY ANGIOGRAM;  Surgeon: Lennette Bihari, MD;  Location: [facility] CATH LAB;  Service: Cardiovascular;  Laterality: N/A;  . LUMBAR LAMINECTOMY/DECOMPRESSION MICRODISCECTOMY Right 04/16/2012   Procedure: DECOMPRESSIVE L4 - L5/ MICRODISCECTOMY ON THE RIGHT 1 LEVEL;  Surgeon: Jacki Cones, MD;  Location: [facility] ORS;  Service: Orthopedics;  Laterality: Right;  . PACEMAKER INSERTION  2006   Medtronic EnRhythm  . PERMANENT PACEMAKER GENERATOR CHANGE N/A 05/10/2013   Procedure: PERMANENT PACEMAKER GENERATOR CHANGE;  Surgeon: Thurmon Fair, MD;  Location: [facility] CATH LAB;  Service: Cardiovascular;  Laterality: N/A;       Home Medications    Prior to Admission medications   Medication Sig Start Date End Date Taking? Authorizing Provider  calcium acetate (PHOSLO) 667 MG capsule Take 667 mg by mouth every morning.    Yes Historical Provider, MD  Cyanocobalamin (VITAMIN B 12 PO) Take 1 tablet by mouth daily.   Yes Historical Provider, MD  cycloSPORINE (RESTASIS) 0.05 % ophthalmic emulsion Place  1 drop into both eyes 2 (two) times daily.    Yes Historical Provider, MD  fish oil-omega-3 fatty acids 1000 MG capsule Take 1 g by mouth daily.    Yes Historical Provider, MD  gabapentin (NEURONTIN) 300 MG capsule Take 300 mg by mouth every evening.   Yes Historical Provider, MD  lanthanum (FOSRENOL) 1000 MG chewable tablet Chew 1,000 mg by mouth 3 (three) times daily with meals.   Yes Historical Provider, MD  midodrine (PROAMATINE) 2.5 MG tablet Take 2.5 mg by mouth 2 (two) times daily with a meal. TAKE ONE TABLET IN THE am AND SECOND DOSE 6 HOURS LATER   Yes Historical Provider, MD  Multiple Vitamins-Minerals (OCUVITE PRESERVISION PO) Take 1 tablet by mouth daily.   Yes Historical Provider, MD  pantoprazole (PROTONIX) 40 MG tablet Take 1 tablet (40 mg total) by mouth 2 (two) times daily. 03/26/14  Yes Belkys A Regalado, MD  predniSONE (STERAPRED UNI-PAK 21 TAB) 10 MG (21) TBPK tablet Take 10 mg by mouth taper from 4 doses each day to 1 dose and stop.   Yes Historical Provider, MD  rOPINIRole (REQUIP)  2 MG tablet Take 1 tablet (2 mg total) by mouth 2 (two) times daily. Patient taking differently: Take 2 mg by mouth 3 (three) times daily.  03/26/14  Yes Alba CoryBelkys A Regalado, MD    Family History Family History  Problem Relation Age of Onset  . Hypertension Mother   . Kidney disease Brother     Social History Social History  Substance Use Topics  . Smoking status: Former Smoker    Types: Cigarettes    Quit date: 02/11/1957  . Smokeless tobacco: Never Used  . Alcohol use No     Allergies   Rosuvastatin calcium; Statins; Aspirin; Feraheme [ferumoxytol]; and Penicillins   Review of Systems Review of Systems 10 Systems reviewed and are negative for acute change except as noted in the HPI.   Physical Exam Updated Vital Signs BP 122/61   Pulse 72   Temp 98.5 F (36.9 C) (Oral)   Resp 19   Ht 5' 7.5" (1.715 m)   Wt 157 lb (71.2 kg)   SpO2 99%   BMI 24.23 kg/m   Physical Exam    Constitutional: He is oriented to person, place, and time. He appears well-developed and well-nourished.  HENT:  Head: Normocephalic and atraumatic.  Mouth/Throat: Oropharynx is clear and moist.  Eyes: Conjunctivae are normal.  Neck: Neck supple.  Cardiovascular: Normal rate and regular rhythm.   No murmur heard. 3\6 systolic ejection murmur.   Pulmonary/Chest: Effort normal and breath sounds normal. No respiratory distress.  Abdominal: Soft. There is no tenderness.  Musculoskeletal: He exhibits edema.  Trace pitting edema bilateral lower extremities.  Neurological: He is alert and oriented to person, place, and time. He exhibits normal muscle tone. Coordination normal.  Skin: Skin is warm and dry.  Psychiatric: He has a normal mood and affect.  Nursing note and vitals reviewed.    ED Treatments / Results  Labs (all labs ordered are listed, but only abnormal results are displayed) Labs Reviewed  BASIC METABOLIC PANEL - Abnormal; Notable for the following:       Result Value   Glucose, Bld 116 (*)    BUN 71 (*)    Creatinine, Ser 5.10 (*)    GFR calc non Af Amer 9 (*)    GFR calc Af Amer 11 (*)    All other components within normal limits  CBC - Abnormal; Notable for the following:    RBC 2.85 (*)    Hemoglobin 8.7 (*)    HCT 26.7 (*)    RDW 16.2 (*)    Platelets 112 (*)    All other components within normal limits  CBC  CREATININE, SERUM  TROPONIN I  TROPONIN I  TROPONIN I  PROTIME-INR  I-STAT TROPOININ, ED    EKG  EKG Interpretation  Date/Time:  Friday January 11 2016 10:37:17 EST Ventricular Rate:  76 PR Interval:    QRS Duration: 194 QT Interval:  480 QTC Calculation: 540 R Axis:   -69 Text Interpretation:  Ventricular-paced rhythm Abnormal ECG agree. no change from old Confirmed by Donnald GarrePfeiffer, MD, [place] [phone]([id number]) on 01/11/2016 11:27:15 AM       Radiology Dg Chest 2 View  Result Date: 01/11/2016 CLINICAL DATA:  Chest pain with lower extremity  weakness EXAM: CHEST  2 VIEW COMPARISON:  January 08, 2016 FINDINGS: There is no appreciable edema or consolidation. Heart is enlarged with pulmonary vascular within normal limits. Pacemaker leads are attached to the right atrium and right ventricle. There is atherosclerotic calcification aorta.  No adenopathy. Patient is status post coronary artery bypass grafting and aortic valve replacement. Bones are osteoporotic. There is anterior wedging of the L1 vertebral body. IMPRESSION: Stable cardiomegaly. Aortic atherosclerosis. No edema or consolidation. Bones osteoporotic. Electronically Signed   By: Bretta BangWilliam  Woodruff III M.D.   On: 01/11/2016 11:22    Procedures Procedures (including critical care time)  Medications Ordered in ED Medications  aspirin EC tablet 81 mg (not administered)  nitroGLYCERIN (NITROSTAT) SL tablet 0.4 mg (not administered)  acetaminophen (TYLENOL) tablet 650 mg (not administered)  ondansetron (ZOFRAN) injection 4 mg (not administered)  heparin injection 5,000 Units (not administered)  metoprolol tartrate (LOPRESSOR) tablet 12.5 mg (not administered)  nitroGLYCERIN (NITRODUR - Dosed in mg/24 hr) patch 0.2 mg (not administered)  calcium acetate (PHOSLO) capsule 667 mg (not administered)  cycloSPORINE (RESTASIS) 0.05 % ophthalmic emulsion 1 drop (not administered)  fish oil-omega-3 fatty acids capsule 1 g (not administered)  gabapentin (NEURONTIN) capsule 300 mg (not administered)  lanthanum (FOSRENOL) chewable tablet 1,000 mg (not administered)  midodrine (PROAMATINE) tablet 2.5 mg (not administered)  pantoprazole (PROTONIX) EC tablet 40 mg (not administered)  rOPINIRole (REQUIP) tablet 2 mg (not administered)     Initial Impression / Assessment and Plan / ED Course  I have reviewed the triage vital signs and the nursing notes.  Pertinent labs & imaging results that were available during my care of the patient were reviewed by me and considered in my medical decision  making (see chart for details).  Clinical Course    Consult: cardiology  Final Clinical Impressions(s) / ED Diagnoses   Final diagnoses:  Precordial pain  Patient presents with chest pain as well as several days of fatigue and discomfort across her shoulders. Concerning for anginal type of chest pain. Patient has significant risk factors and comorbid illness. Cardiology has evaluated and admitted. The patient took aspirin prior to arrival, 325 mg.  New Prescriptions New Prescriptions   No medications on file     Arby BarretteMarcy Kaycee Mcgaugh, MD 01/11/16 1329

## 2016-01-11 NOTE — ED Notes (Addendum)
Yellow socks and band applied to pt.  Pink "restricted extremity" band applied to left arm.

## 2016-01-11 NOTE — ED Notes (Signed)
Patient transported to X-ray 

## 2016-01-12 ENCOUNTER — Observation Stay (HOSPITAL_COMMUNITY): Payer: Medicare Other

## 2016-01-12 ENCOUNTER — Observation Stay (HOSPITAL_BASED_OUTPATIENT_CLINIC_OR_DEPARTMENT_OTHER): Payer: Medicare Other

## 2016-01-12 DIAGNOSIS — R079 Chest pain, unspecified: Secondary | ICD-10-CM | POA: Diagnosis not present

## 2016-01-12 DIAGNOSIS — Z950 Presence of cardiac pacemaker: Secondary | ICD-10-CM

## 2016-01-12 DIAGNOSIS — I482 Chronic atrial fibrillation: Secondary | ICD-10-CM

## 2016-01-12 DIAGNOSIS — N186 End stage renal disease: Secondary | ICD-10-CM | POA: Diagnosis not present

## 2016-01-12 DIAGNOSIS — R072 Precordial pain: Secondary | ICD-10-CM | POA: Diagnosis not present

## 2016-01-12 DIAGNOSIS — I5032 Chronic diastolic (congestive) heart failure: Secondary | ICD-10-CM

## 2016-01-12 DIAGNOSIS — Z953 Presence of xenogenic heart valve: Secondary | ICD-10-CM

## 2016-01-12 DIAGNOSIS — Z951 Presence of aortocoronary bypass graft: Secondary | ICD-10-CM

## 2016-01-12 LAB — COMPREHENSIVE METABOLIC PANEL
ALT: 13 U/L — ABNORMAL LOW (ref 17–63)
AST: 22 U/L (ref 15–41)
Albumin: 3.5 g/dL (ref 3.5–5.0)
Alkaline Phosphatase: 59 U/L (ref 38–126)
Anion gap: 11 (ref 5–15)
BUN: 24 mg/dL — ABNORMAL HIGH (ref 6–20)
CO2: 30 mmol/L (ref 22–32)
Calcium: 9.2 mg/dL (ref 8.9–10.3)
Chloride: 96 mmol/L — ABNORMAL LOW (ref 101–111)
Creatinine, Ser: 2.51 mg/dL — ABNORMAL HIGH (ref 0.61–1.24)
GFR calc Af Amer: 25 mL/min — ABNORMAL LOW (ref >60)
GFR calc non Af Amer: 22 mL/min — ABNORMAL LOW (ref >60)
Glucose, Bld: 128 mg/dL — ABNORMAL HIGH (ref 65–99)
Potassium: 3.1 mmol/L — ABNORMAL LOW (ref 3.5–5.1)
Sodium: 137 mmol/L (ref 135–145)
Total Bilirubin: 0.8 mg/dL (ref 0.3–1.2)
Total Protein: 6.3 g/dL — ABNORMAL LOW (ref 6.5–8.1)

## 2016-01-12 LAB — TROPONIN I: Troponin I: 0.05 ng/mL (ref <0.03)

## 2016-01-12 LAB — NM MYOCAR MULTI W/SPECT W/WALL MOTION / EF
Estimated workload: 1 METS
Exercise duration (min): 0 min
Exercise duration (sec): 0 s
MPHR: 133 {beats}/min
Peak HR: 75 {beats}/min
Percent HR: 56 %
RPE: 0
Rest HR: 73 {beats}/min

## 2016-01-12 LAB — MAGNESIUM: Magnesium: 2 mg/dL (ref 1.7–2.4)

## 2016-01-12 MED ORDER — REGADENOSON 0.4 MG/5ML IV SOLN
INTRAVENOUS | Status: AC
  Filled 2016-01-12: qty 5

## 2016-01-12 MED ORDER — MAGNESIUM GLUCONATE 500 MG PO TABS
500.0000 mg | ORAL_TABLET | Freq: Once | ORAL | Status: AC
  Administered 2016-01-12: 500 mg via ORAL
  Filled 2016-01-12: qty 1

## 2016-01-12 MED ORDER — TECHNETIUM TC 99M TETROFOSMIN IV KIT
10.0000 | PACK | Freq: Once | INTRAVENOUS | Status: AC | PRN
  Administered 2016-01-12: 10 via INTRAVENOUS

## 2016-01-12 MED ORDER — TECHNETIUM TC 99M TETROFOSMIN IV KIT
30.0000 | PACK | Freq: Once | INTRAVENOUS | Status: AC | PRN
  Administered 2016-01-12: 30 via INTRAVENOUS

## 2016-01-12 MED ORDER — ROPINIROLE HCL 1 MG PO TABS
2.0000 mg | ORAL_TABLET | Freq: Three times a day (TID) | ORAL | Status: DC
  Administered 2016-01-12 – 2016-01-13 (×2): 2 mg via ORAL
  Filled 2016-01-12 (×2): qty 2

## 2016-01-12 MED ORDER — REGADENOSON 0.4 MG/5ML IV SOLN
0.4000 mg | Freq: Once | INTRAVENOUS | Status: AC
  Administered 2016-01-12: 0.4 mg via INTRAVENOUS
  Filled 2016-01-12: qty 5

## 2016-01-12 MED ORDER — METOPROLOL TARTRATE 12.5 MG HALF TABLET
12.5000 mg | ORAL_TABLET | Freq: Two times a day (BID) | ORAL | Status: DC
  Administered 2016-01-12 – 2016-01-13 (×3): 12.5 mg via ORAL
  Filled 2016-01-12 (×2): qty 1

## 2016-01-12 MED ORDER — POTASSIUM CHLORIDE CRYS ER 20 MEQ PO TBCR
40.0000 meq | EXTENDED_RELEASE_TABLET | Freq: Once | ORAL | Status: AC
  Administered 2016-01-12: 40 meq via ORAL
  Filled 2016-01-12: qty 2

## 2016-01-12 NOTE — Progress Notes (Signed)
Patient Name: Nathaniel Steele Date of Encounter: 01/12/2016  [facility]  Hospital Problem List     Principal Problem:   Chest pain Active Problems:   End-stage renal disease (ESRD) (HCC)   SLE (systemic lupus erythematosus) (HCC)   S/P CABG x 2: (LIMA-LAD  SVG - RCA)   Permanent atrial fibrillation (HCC)   Pacemaker, MDT implanted 08/06, generator change April 2015   Chronic diastolic heart failure (HCC)   History of aortic valve replacement with bioprosthetic valve   Chest pain with moderate risk of acute coronary syndrome     Subjective   Mild chest discomfort lying back improved with sitting up  Inpatient Medications    Scheduled Meds: . aspirin EC  81 mg Oral Daily  . calcium acetate  667 mg Oral q morning - 10a  . cycloSPORINE  1 drop Both Eyes BID  . gabapentin  300 mg Oral QPM  . heparin  5,000 Units Subcutaneous Q8H  . lanthanum  1,000 mg Oral TID WC  . metoprolol tartrate  12.5 mg Oral BID  . midodrine  2.5 mg Oral BID WC  . nitroGLYCERIN  0.2 mg Transdermal Q24H  . omega-3 acid ethyl esters  1 g Oral Daily  . pantoprazole  40 mg Oral BID  . predniSONE  10 mg Oral QPC supper  . predniSONE  10 mg Oral Q breakfast  . predniSONE  10 mg Oral Q lunch  . regadenoson      . rOPINIRole  2 mg Oral TID   Continuous Infusions:  PRN Meds: acetaminophen, nitroGLYCERIN, ondansetron (ZOFRAN) IV   Vital Signs    Vitals:   01/12/16 0300 01/12/16 0857 01/12/16 0905 01/12/16 0908  BP: 114/63 124/69 (!) 79/48 (!) 93/50  Pulse: 74  71   Resp: 16     Temp: 97.9 F (36.6 C)     TempSrc: Oral     SpO2: 98%     Weight: 155 lb (70.3 kg)     Height:        Intake/Output Summary (Last 24 hours) at 01/12/16 0921 Last data filed at 01/12/16 0600  Gross per 24 hour  Intake              200 ml  Output             2283 ml  Net            -2083 ml   Filed Weights   01/11/16 1034 01/11/16 1852 01/12/16 0300  Weight: 157 lb (71.2 kg) 152 lb  4.8 oz (69.1 kg) 155 lb (70.3 kg)    Physical Exam   GEN: Thin male,  in no acute distress.  HEENT: normocephalic, sclera clear, mucus membranes moist.  Neck: Supple, + JVD, or masses. Cardiac: RRR, no murmurs, rubs, or gallops. No clubbing, cyanosis, edema.  Radials/DP/PT 1+ and equal bilaterally.  Respiratory:  Respirations regular and unlabored, clear to auscultation bilaterally without rales, rhonchi or wheezes. GI: Abd -Soft, nontender, nondistended, BS + x 4. MS: no deformity or atrophy. Skin: warm and dry, brisk capillary refill, no obvious rash Neuro:  Alert and oriented X 3 MAE, follows commands Psych: answers questions appropriately,Normal and pleasant affect.   Labs    CBC  Recent Labs  01/11/16 1037 01/11/16 1322  WBC 6.6 6.5  HGB 8.7* 8.4*  HCT 26.7* 25.8*  MCV 93.7 93.1  PLT 112* 104*   Basic Metabolic Panel  Recent Labs  01/11/16 1037  01/11/16 1322 01/12/16 0020  NA 142  --  137  K 4.1  --  3.1*  CL 102  --  96*  CO2 28  --  30  GLUCOSE 116*  --  128*  BUN 71*  --  24*  CREATININE 5.10* 5.03* 2.51*  CALCIUM 9.4  --  9.2  MG  --   --  2.0   Liver Function Tests  Recent Labs  01/12/16 0020  AST 22  ALT 13*  ALKPHOS 59  BILITOT 0.8  PROT 6.3*  ALBUMIN 3.5   No results for input(s): LIPASE, AMYLASE in the last 72 hours. Cardiac Enzymes  Recent Labs  01/11/16 1322 01/11/16 1853 01/12/16 0020  TROPONINI 0.05* 0.05* 0.05*   BNP Invalid input(s): POCBNP D-Dimer No results for input(s): DDIMER in the last 72 hours. Hemoglobin A1C No results for input(s): HGBA1C in the last 72 hours. Fasting Lipid Panel No results for input(s): CHOL, HDL, LDLCALC, TRIG, CHOLHDL, LDLDIRECT in the last 72 hours. Thyroid Function Tests No results for input(s): TSH, T4TOTAL, T3FREE, THYROIDAB in the last 72 hours.  Invalid input(s): FREET3  Telemetry    Pacing with NSVT 15 beats last PM - Personally Reviewed  ECG    A fib with V pacing -  Personally Reviewed  Radiology    Dg Chest 2 View  Result Date: 01/11/2016 CLINICAL DATA:  Chest pain with lower extremity weakness EXAM: CHEST  2 VIEW COMPARISON:  January 08, 2016 FINDINGS: There is no appreciable edema or consolidation. Heart is enlarged with pulmonary vascular within normal limits. Pacemaker leads are attached to the right atrium and right ventricle. There is atherosclerotic calcification aorta. No adenopathy. Patient is status post coronary artery bypass grafting and aortic valve replacement. Bones are osteoporotic. There is anterior wedging of the L1 vertebral body. IMPRESSION: Stable cardiomegaly. Aortic atherosclerosis. No edema or consolidation. Bones osteoporotic. Electronically Signed   By: Bretta BangWilliam  Woodruff III M.D.   On: 01/11/2016 11:22    Cardiac Studies   nuc pending  Patient Profile     82 [facility] [facility] [place] a history of ESRD-on HD, CAD and AOV disease-s/p CABG and tissue AVR 2013, CAF- not a candidate for anticoagulation secondary to GI bleeding history, SSS s/p MDT PTVDP-last gen change 2015, and orthostatic hypotension limiting medical Rx.   Now with chest pain.   Assessment & Plan   Chest pain - nuc completed, minimal rise in troponin with ESRD and flat 0.05, per Dr. Jens Somrenshaw " Lexiscan nuclear study  If low risk would treat medically. If high risk he may need to be challenged with dual antiplatelet therapy and if he tolerates proceed with catheterization to assess lesion possibly amenable to PCI. We will add enteric-coated aspirin 81 mg daily. He is not a great candidate for antianginals given history of orthostatic hypotension. He is on midodrine. I will try low-dose metoprolol to see if he tolerates  NSVT last pm,  None currently  Permanent A fib s/p PPM pacing.not on anticoagulation secondary to GI bleeding  end-stage renal disease- nephrology following  coronary artery disease-we will add aspirin 81 mg daily. He  is intolerant to statins  orthostatic hypotension-continue midodrine  Hypokalemia with NSVT replaced in night  anemia hgb at 8.4 was 9.1 in March  Signed, Nada BoozerLaura Ingold, NP  01/12/2016, 9:21 AM  [facility] Pager [phone]  After 5 or weekends [phone]  The patient was seen, examined and discussed with  Nada Boozer, NP and I agree with the above.   The patient is currently chest pain free and post nuclear stress test this morning, results are pending. Troponin elevation was minimal with flat trend. If negative stress test he can be discharged home. We will replace potassium. The patient had a run of nsVT - 16 beats total the last night that was asymptomatic, his BB was held sec to hypotension. We will resume diet and give metoprolol later today.  Tobias Alexander 01/12/2016

## 2016-01-12 NOTE — Progress Notes (Signed)
nuc study stress portion complete without complications.

## 2016-01-12 NOTE — Progress Notes (Signed)
[facility] ROUNDING NOTE   Subjective:   Interval History: appears to be doing well no complaints   Objective:  Vital signs in last 24 hours:  Temp:  [97.8 F (36.6 C)-98 F (36.7 C)] 97.9 F (36.6 C) (12/23 0300) Pulse Rate:  [49-111] 71 (12/23 0905) Resp:  [15-18] 16 (12/23 0300) BP: (79-124)/(48-71) 93/50 (12/23 0908) SpO2:  [93 %-98 %] 98 % (12/23 0300) Weight:  [69.1 kg (152 lb 4.8 oz)-70.3 kg (155 lb)] 70.3 kg (155 lb) (12/23 0300)  Weight change:  Filed Weights   01/11/16 1034 01/11/16 1852 01/12/16 0300  Weight: 71.2 kg (157 lb) 69.1 kg (152 lb 4.8 oz) 70.3 kg (155 lb)    Intake/Output: I/O last 3 completed shifts: In: 200 [P.O.:200] Out: 2283 [Other:2283]   Intake/Output this shift:  No intake/output data recorded.  CVS- RRR RS- CTA ABD- BS present soft non-distended EXT- no edema   Basic Metabolic Panel:  Recent Labs Lab 01/11/16 1037 01/11/16 1322 01/12/16 0020  NA 142  --  137  K 4.1  --  3.1*  CL 102  --  96*  CO2 28  --  30  GLUCOSE 116*  --  128*  BUN 71*  --  24*  CREATININE 5.10* 5.03* 2.51*  CALCIUM 9.4  --  9.2  MG  --   --  2.0    Liver Function Tests:  Recent Labs Lab 01/12/16 0020  AST 22  ALT 13*  ALKPHOS 59  BILITOT 0.8  PROT 6.3*  ALBUMIN 3.5   No results for input(s): LIPASE, AMYLASE in the last 168 hours. No results for input(s): AMMONIA in the last 168 hours.  CBC:  Recent Labs Lab 01/11/16 1037 01/11/16 1322  WBC 6.6 6.5  HGB 8.7* 8.4*  HCT 26.7* 25.8*  MCV 93.7 93.1  PLT 112* 104*    Cardiac Enzymes:  Recent Labs Lab 01/11/16 1322 01/11/16 1853 01/12/16 0020  TROPONINI 0.05* 0.05* 0.05*    BNP: Invalid input(s): POCBNP  CBG: No results for input(s): GLUCAP in the last 168 hours.  Microbiology: Results for orders placed or performed during the hospital encounter of 05/08/13  Surgical pcr screen     Status: None   Collection Time: 05/10/13  1:56 AM  Result Value Ref  Range Status   MRSA, PCR NEGATIVE NEGATIVE Final   Staphylococcus aureus NEGATIVE NEGATIVE Final    Comment:        The Xpert SA Assay (FDA approved for NASAL specimens in patients over 80 years of age), is one component of a comprehensive surveillance program.  Test performance has been validated by [facility] for patients greater than or equal to 80 year old. It is not intended to diagnose infection nor to guide or monitor treatment.    Coagulation Studies:  Recent Labs  01/11/16 1322  LABPROT 14.2  INR 1.10    Urinalysis: No results for input(s): COLORURINE, LABSPEC, PHURINE, GLUCOSEU, HGBUR, BILIRUBINUR, KETONESUR, PROTEINUR, UROBILINOGEN, NITRITE, LEUKOCYTESUR in the last 72 hours.  Invalid input(s): APPERANCEUR    Imaging: Dg Chest 2 View  Result Date: 01/11/2016 CLINICAL DATA:  Chest pain with lower extremity weakness EXAM: CHEST  2 VIEW COMPARISON:  January 08, 2016 FINDINGS: There is no appreciable edema or consolidation. Heart is enlarged with pulmonary vascular within normal limits. Pacemaker leads are attached to the right atrium and right ventricle. There is atherosclerotic calcification aorta. No adenopathy. Patient is status post coronary artery bypass grafting and aortic valve replacement.  Bones are osteoporotic. There is anterior wedging of the L1 vertebral body. IMPRESSION: Stable cardiomegaly. Aortic atherosclerosis. No edema or consolidation. Bones osteoporotic. Electronically Signed   By: Bretta BangWilliam  Woodruff III M.D.   On: 01/11/2016 11:22   Nm Myocar Multi W/spect W/wall Motion / Ef  Result Date: 01/12/2016 CLINICAL DATA:  Chest pain EXAM: MYOCARDIAL IMAGING WITH SPECT (REST AND PHARMACOLOGIC-STRESS) GATED LEFT VENTRICULAR WALL MOTION STUDY LEFT VENTRICULAR EJECTION FRACTION TECHNIQUE: Standard myocardial SPECT imaging was performed after resting intravenous injection of 10 mCi Tc-1130m tetrofosmin. Subsequently, intravenous infusion of Lexiscan was  performed under the supervision of the Cardiology staff. At peak effect of the drug, 30 mCi Tc-7930m tetrofosmin was injected intravenously and standard myocardial SPECT imaging was performed. Quantitative gated imaging was also performed to evaluate left ventricular wall motion, and estimate left ventricular ejection fraction. COMPARISON:  None. FINDINGS: Perfusion: There is attenuation artifact just inferior to the apex. No definite perfusion defects are identified. No stress-induced ischemia. Wall Motion: Severe global hypokinesis Left Ventricular Ejection Fraction: 15 % End diastolic volume 125 ml End systolic volume 107 ml IMPRESSION: 1. No perfusion defects allowing for inferior apical attenuation artifact. 2. Global hypokinesis. 3. Left ventricular ejection fraction 15% 4. Non invasive risk stratification*: High risk. *2012 Appropriate Use Criteria for Coronary Revascularization Focused Update: J Am Coll Cardiol. 2012;59(9):857-881. http://content.dementiazones.comonlinejacc.org/article.aspx?articleid=1201161 Electronically Signed   By: Jolaine ClickArthur  Hoss M.D.   On: 01/12/2016 11:42     Medications:    . aspirin EC  81 mg Oral Daily  . calcium acetate  667 mg Oral q morning - 10a  . cycloSPORINE  1 drop Both Eyes BID  . gabapentin  300 mg Oral QPM  . heparin  5,000 Units Subcutaneous Q8H  . lanthanum  1,000 mg Oral TID WC  . metoprolol tartrate  12.5 mg Oral BID  . midodrine  2.5 mg Oral BID WC  . nitroGLYCERIN  0.2 mg Transdermal Q24H  . omega-3 acid ethyl esters  1 g Oral Daily  . pantoprazole  40 mg Oral BID  . predniSONE  10 mg Oral QPC supper  . predniSONE  10 mg Oral Q breakfast  . regadenoson      . rOPINIRole  2 mg Oral TID   acetaminophen, nitroGLYCERIN, ondansetron (ZOFRAN) IV  Assessment/ Plan:   ESRD- plan dialysis today MWF  usually   Will plan dialysis tomorrow due to holiday  ANEMIA- stable no ESA  MBD- resume vitamin D and binders  HTN/VOL- chronic hypotension on midodrine   Chest  pain  --stress test today    LOS: 0 Doneen Ollinger W @TODAY @3 :12 PM

## 2016-01-13 ENCOUNTER — Observation Stay (HOSPITAL_BASED_OUTPATIENT_CLINIC_OR_DEPARTMENT_OTHER): Payer: Medicare Other

## 2016-01-13 DIAGNOSIS — R072 Precordial pain: Secondary | ICD-10-CM | POA: Diagnosis not present

## 2016-01-13 DIAGNOSIS — I200 Unstable angina: Secondary | ICD-10-CM

## 2016-01-13 DIAGNOSIS — Z953 Presence of xenogenic heart valve: Secondary | ICD-10-CM | POA: Diagnosis not present

## 2016-01-13 DIAGNOSIS — R079 Chest pain, unspecified: Secondary | ICD-10-CM | POA: Diagnosis not present

## 2016-01-13 DIAGNOSIS — I2510 Atherosclerotic heart disease of native coronary artery without angina pectoris: Secondary | ICD-10-CM | POA: Diagnosis not present

## 2016-01-13 DIAGNOSIS — Z950 Presence of cardiac pacemaker: Secondary | ICD-10-CM | POA: Diagnosis not present

## 2016-01-13 LAB — ECHOCARDIOGRAM COMPLETE
E decel time: 204 msec
E/e' ratio: 15.54
FS: 19 % — AB (ref 28–44)
Height: 67 in
IVS/LV PW RATIO, ED: 1.09
LA ID, A-P, ES: 49 mm
LA diam end sys: 49 mm
LA diam index: 2.74 cm/m2
LA vol A4C: 151 ml
LA vol index: 98.3 mL/m2
LA vol: 176 mL
LV E/e' medial: 15.54
LV E/e'average: 15.54
LV PW d: 14.6 mm — AB (ref 0.6–1.1)
LV e' LATERAL: 9.46 cm/s
LVOT area: 3.46 cm2
LVOT diameter: 21 mm
Lateral S' vel: 6.42 cm/s
MV Dec: 204
MV Peak grad: 9 mmHg
MV pk A vel: 85.4 m/s
MV pk E vel: 147 m/s
P 1/2 time: 417 ms
TAPSE: 11 mm
TDI e' lateral: 9.46
TDI e' medial: 5.22
Weight: 2391.55 oz

## 2016-01-13 LAB — CBC
HCT: 26.4 % — ABNORMAL LOW (ref 39.0–52.0)
HEMOGLOBIN: 8.8 g/dL — AB (ref 13.0–17.0)
MCH: 30.6 pg (ref 26.0–34.0)
MCHC: 33.3 g/dL (ref 30.0–36.0)
MCV: 91.7 fL (ref 78.0–100.0)
Platelets: 118 10*3/uL — ABNORMAL LOW (ref 150–400)
RBC: 2.88 MIL/uL — AB (ref 4.22–5.81)
RDW: 15.7 % — ABNORMAL HIGH (ref 11.5–15.5)
WBC: 6.3 10*3/uL (ref 4.0–10.5)

## 2016-01-13 LAB — RENAL FUNCTION PANEL
ANION GAP: 13 (ref 5–15)
Albumin: 3.6 g/dL (ref 3.5–5.0)
BUN: 63 mg/dL — ABNORMAL HIGH (ref 6–20)
CALCIUM: 9.4 mg/dL (ref 8.9–10.3)
CO2: 25 mmol/L (ref 22–32)
Chloride: 98 mmol/L — ABNORMAL LOW (ref 101–111)
Creatinine, Ser: 4.27 mg/dL — ABNORMAL HIGH (ref 0.61–1.24)
GFR, EST AFRICAN AMERICAN: 13 mL/min — AB (ref >60)
GFR, EST NON AFRICAN AMERICAN: 11 mL/min — AB (ref >60)
Glucose, Bld: 99 mg/dL (ref 65–99)
PHOSPHORUS: 5.4 mg/dL — AB (ref 2.5–4.6)
Potassium: 4.7 mmol/L (ref 3.5–5.1)
SODIUM: 136 mmol/L (ref 135–145)

## 2016-01-13 LAB — HEPATITIS B SURFACE ANTIGEN: HEP B S AG: NEGATIVE

## 2016-01-13 MED ORDER — PENTAFLUOROPROP-TETRAFLUOROETH EX AERO: 1.0000 "application " | INHALATION_SPRAY | CUTANEOUS | Status: DC | PRN

## 2016-01-13 MED ORDER — ASPIRIN 81 MG PO TBEC: 81.0000 mg | DELAYED_RELEASE_TABLET | Freq: Every day | ORAL | Status: AC

## 2016-01-13 MED ORDER — NITROGLYCERIN 0.4 MG SL SUBL: 0.4000 mg | SUBLINGUAL_TABLET | SUBLINGUAL | 4 refills | Status: DC | PRN

## 2016-01-13 MED ORDER — ALTEPLASE 2 MG IJ SOLR
2.0000 mg | Freq: Once | INTRAMUSCULAR | Status: DC | PRN
Start: 2016-01-13 — End: 2016-01-13

## 2016-01-13 MED ORDER — SODIUM CHLORIDE 0.9 % IV SOLN: 100.0000 mL | INTRAVENOUS | Status: DC | PRN

## 2016-01-13 MED ORDER — COLCHICINE 0.6 MG PO TABS: 0.6000 mg | ORAL_TABLET | Freq: Two times a day (BID) | ORAL | 1 refills | Status: DC

## 2016-01-13 MED ORDER — METOPROLOL TARTRATE 25 MG PO TABS: 12.5000 mg | ORAL_TABLET | Freq: Two times a day (BID) | ORAL | 6 refills | Status: DC

## 2016-01-13 MED ORDER — LIDOCAINE HCL (PF) 1 % IJ SOLN: 5.0000 mL | INTRAMUSCULAR | Status: DC | PRN

## 2016-01-13 MED ORDER — HEPARIN SODIUM (PORCINE) 1000 UNIT/ML DIALYSIS: 1000.0000 [IU] | INTRAMUSCULAR | Status: DC | PRN

## 2016-01-13 MED ORDER — NITROGLYCERIN 0.2 MG/HR TD PT24: 0.2000 mg | MEDICATED_PATCH | TRANSDERMAL | 6 refills | Status: DC

## 2016-01-13 MED ORDER — COLCHICINE 0.6 MG PO TABS: 0.6000 mg | ORAL_TABLET | Freq: Two times a day (BID) | ORAL | Status: DC

## 2016-01-13 MED ORDER — LIDOCAINE-PRILOCAINE 2.5-2.5 % EX CREA: 1.0000 "application " | TOPICAL_CREAM | CUTANEOUS | Status: DC | PRN

## 2016-01-13 NOTE — Procedures (Signed)
I have seen and examined this patient and agree with the plan of care    No complaints   BP 110/70  Goal 2500   AVF  No issues   No edema      Hb 8.8       Phos 5.4   Calcium 9.4   Bicarbonate   25       Quency Tober W 01/13/2016, 8:40 AM

## 2016-01-13 NOTE — Plan of Care (Signed)
Problem: Safety: Goal: Ability to remain free from injury will improve Outcome: Completed/Met Date Met: 01/13/16 Patient uses call light appropriately and is able to call RN as needed to assist him and knows not to get OOB without calling for help. Patient has call light and phone on bedside stand within reach along with his personal items.

## 2016-01-13 NOTE — Plan of Care (Signed)
Problem: Pain Managment: Goal: General experience of comfort will improve Outcome: Completed/Met Date Met: 01/13/16 Patient denies pain and is able to express his needs and if he has pain will call.

## 2016-01-13 NOTE — Discharge Instructions (Signed)
Heart healthy low salt diet.  Call if any weight gain or SOB.    We added asprin and metoprolol for your heart.  Your ultrasound of your heart was normal pump action.

## 2016-01-13 NOTE — Progress Notes (Signed)
[facility] using SBAR format, reviewed orders, labs, tests, VS, meds and patient's general condition, assumed care of patient.

## 2016-01-13 NOTE — Discharge Summary (Signed)
Discharge Summary    Patient ID: Nathaniel Steele,  MRN: [id number], DOB/AGE: 80-10-1926 80 y.o.  Admit date: 01/11/2016 Discharge date: 01/13/2016  Primary Care Provider: BURNETT,BRENT A Primary Cardiologist: Dr. Royann Shivers  Discharge Diagnoses    Principal Problem:   Chest pain most likely due to pericarditis.   Active Problems:   End-stage renal disease (ESRD) (HCC)   SLE (systemic lupus erythematosus) (HCC)   S/P CABG x 2: (LIMA-LAD  SVG - RCA)   Permanent atrial fibrillation (HCC)   Pacemaker, MDT implanted 08/06, generator change April 2015   Chronic diastolic heart failure (HCC)   History of aortic valve replacement with bioprosthetic valve   Chest pain with moderate risk of acute coronary syndrome   Allergies Allergies  Allergen Reactions  . Rosuvastatin Calcium Other (See Comments)    myalgias  . Statins Other (See Comments)    myalgias  . Aspirin Nausea Only    Patient states that he can take the enteric coated but not the uncoated  . Feraheme [Ferumoxytol] Rash and Other (See Comments)    Abdominal pain  . Penicillins Hives    Diagnostic Studies/Procedures    ECHO pending   Study Conclusions  - Left ventricle: The cavity size was normal. There was moderate   concentric hypertrophy. Systolic function was normal. The   estimated ejection fraction was in the range of 55% to 60%. Wall   motion was normal; there were no regional wall motion   abnormalities. Features are consistent with a pseudonormal left   ventricular filling pattern, with concomitant abnormal relaxation   and increased filling pressure (grade 2 diastolic dysfunction).   Doppler parameters are consistent with elevated ventricular   end-diastolic filling pressure. - Aortic valve: A bioprosthetic valve is present in the aortic   position functioning normally. Normal transaortic gradients.   There is moderate aortic regurgitation, no obvious paravalvular   leak. There was moderate  regurgitation. - Mitral valve: Calcified annulus. Severely thickened, severely   calcified leaflets . Mobility was restricted. There was mild   regurgitation. - Left atrium: The atrium was severely dilated. - Right ventricle: The cavity size was mildly dilated. Wall   thickness was normal. Systolic function was mildly reduced. - Right atrium: The atrium was moderately dilated. Pacer wire or   catheter noted in right atrium. - Tricuspid valve: There was mild regurgitation. - Pulmonary arteries: Systolic pressure was within the normal   range. - Inferior vena cava: The vessel was dilated. The respirophasic   diameter changes were blunted (< 50%), consistent with elevated   central venous pressure. - Pericardium, extracardiac: There was no pericardial effusion.  -------------------------------------------------------------------  _____________  NUC study 01/12/16  below   History of Present Illness     82 y/o male followed at the [facility] in [place] with a history of ESRD-on HD (MWF), CAD and AOV disease-s/p CABG and tissue AVR 2013, CAF- not a candidate for anticoagulation secondary to GI bleeding history, SSS s/p MDT PTVDP-last gen change 2015, and orthostatic hypotension limiting medical Rx.   The pt [facility] (did not get scheduled dialysis) with compaints of sudden Lt upper chest "sharp" pain. He denies any exacerbation with deep breathing, or movement. He did admit to a history of exertional discomfort between his shoulders and dyspnea for the past few weeks.  Pt was admitted to follow troponins and have nuc study.     Hospital Course     Consultants: nephrology  Pt was  admitted and low dose asa added and low dose BB.  His midodrine was continued.  troponins were flat at 0.05 to 0.04.  He is anemic with hgb at 8.8.  He had hypokalemia and this was replaced.  His nuc study had no ischemia but EF was 15%.  Echo with normal EF and G2DD.  One episode of nsVT - 16  beats total on 12/22 that was asymptomatic, now on metoprolol Today pt was notified of nuc results and low EF and thoughts to cardiac cath.  Pt decided he did not wish to have cardiac cath.  He was seen by Dr. Delton See today after dialysis - they discussed cath and he does not wish to proceed with cath.  She found him stable for discharge.  His BP is low frequently. Dr. Delton See felt the chest pain was pericarditis. We added colchicine.  Pt had dialysis prior to discharge.  He will follow up on Wed. As scheduled for dialysis.   I called in new meds to [facility] as his pharmacy is closed.   _____________  Discharge Vitals Blood pressure (!) 85/47, pulse 72, temperature 97.8 F (36.6 C), temperature source Oral, resp. rate 16, height 5\' 7"  (1.702 m), weight 149 lb 7.6 oz (67.8 kg), SpO2 92 %.  Filed Weights   01/13/16 0520 01/13/16 0720 01/13/16 1126  Weight: 154 lb 6.4 oz (70 kg) 154 lb 1.6 oz (69.9 kg) 149 lb 7.6 oz (67.8 kg)    Labs & Radiologic Studies    CBC  Recent Labs  01/11/16 1322 01/13/16 0755  WBC 6.5 6.3  HGB 8.4* 8.8*  HCT 25.8* 26.4*  MCV 93.1 91.7  PLT 104* 118*   Basic Metabolic Panel  Recent Labs  01/12/16 0020 01/13/16 0755  NA 137 136  K 3.1* 4.7  CL 96* 98*  CO2 30 25  GLUCOSE 128* 99  BUN 24* 63*  CREATININE 2.51* 4.27*  CALCIUM 9.2 9.4  MG 2.0  --   PHOS  --  5.4*   Liver Function Tests  Recent Labs  01/12/16 0020 01/13/16 0755  AST 22  --   ALT 13*  --   ALKPHOS 59  --   BILITOT 0.8  --   PROT 6.3*  --   ALBUMIN 3.5 3.6   No results for input(s): LIPASE, AMYLASE in the last 72 hours. Cardiac Enzymes  Recent Labs  01/11/16 1322 01/11/16 1853 01/12/16 0020  TROPONINI 0.05* 0.05* 0.05*   BNP Invalid input(s): POCBNP D-Dimer No results for input(s): DDIMER in the last 72 hours. Hemoglobin A1C No results for input(s): HGBA1C in the last 72 hours. Fasting Lipid Panel No results for input(s): CHOL, HDL, LDLCALC, TRIG,  CHOLHDL, LDLDIRECT in the last 72 hours. Thyroid Function Tests No results for input(s): TSH, T4TOTAL, T3FREE, THYROIDAB in the last 72 hours.  Invalid input(s): FREET3   Neg Hepatitis B  _____________  Dg Chest 2 View  Result Date: 01/11/2016 CLINICAL DATA:  Chest pain with lower extremity weakness EXAM: CHEST  2 VIEW COMPARISON:  January 08, 2016 FINDINGS: There is no appreciable edema or consolidation. Heart is enlarged with pulmonary vascular within normal limits. Pacemaker leads are attached to the right atrium and right ventricle. There is atherosclerotic calcification aorta. No adenopathy. Patient is status post coronary artery bypass grafting and aortic valve replacement. Bones are osteoporotic. There is anterior wedging of the L1 vertebral body. IMPRESSION: Stable cardiomegaly. Aortic atherosclerosis. No edema or consolidation. Bones osteoporotic. Electronically Signed  By: Bretta BangWilliam  Woodruff III M.D.   On: 01/11/2016 11:22   Nm Myocar Multi W/spect W/wall Motion / Ef  Result Date: 01/12/2016 CLINICAL DATA:  Chest pain EXAM: MYOCARDIAL IMAGING WITH SPECT (REST AND PHARMACOLOGIC-STRESS) GATED LEFT VENTRICULAR WALL MOTION STUDY LEFT VENTRICULAR EJECTION FRACTION TECHNIQUE: Standard myocardial SPECT imaging was performed after resting intravenous injection of 10 mCi Tc-43106m tetrofosmin. Subsequently, intravenous infusion of Lexiscan was performed under the supervision of the Cardiology staff. At peak effect of the drug, 30 mCi Tc-72106m tetrofosmin was injected intravenously and standard myocardial SPECT imaging was performed. Quantitative gated imaging was also performed to evaluate left ventricular wall motion, and estimate left ventricular ejection fraction. COMPARISON:  None. FINDINGS: Perfusion: There is attenuation artifact just inferior to the apex. No definite perfusion defects are identified. No stress-induced ischemia. Wall Motion: Severe global hypokinesis Left Ventricular Ejection  Fraction: 15 % End diastolic volume 125 ml End systolic volume 107 ml IMPRESSION: 1. No perfusion defects allowing for inferior apical attenuation artifact. 2. Global hypokinesis. 3. Left ventricular ejection fraction 15% 4. Non invasive risk stratification*: High risk. *2012 Appropriate Use Criteria for Coronary Revascularization Focused Update: J Am Coll Cardiol. 2012;59(9):857-881. http://content.dementiazones.comonlinejacc.org/article.aspx?articleid=1201161 Electronically Signed   By: Jolaine ClickArthur  Hoss M.D.   On: 01/12/2016 11:42   Disposition   Pt is being discharged home today in good condition.  Follow-up Plans & Appointments  Heart healthy low salt diet.  Call if any weight gain or SOB.   We added asprin and metoprolol for your heart.  Your ultrasound of your heart was normal pump action.    Follow-up Information    Barrett, Bjorn LoserRhonda, PA-C Follow up on 01/22/2016.   Specialties:  Cardiology, Radiology Why:  at 10:30 AM with Dr. Berle Mulloriotru's PA.   Contact information: [address] [place] [id number] [phone]            Discharge Medications   Current Discharge Medication List    START taking these medications   Details  aspirin EC 81 MG EC tablet Take 1 tablet (81 mg total) by mouth daily.    colchicine 0.6 MG tablet Take 1 tablet (0.6 mg total) by mouth 2 (two) times daily. Qty: 60 tablet, Refills: 1    metoprolol tartrate (LOPRESSOR) 25 MG tablet Take 0.5 tablets (12.5 mg total) by mouth 2 (two) times daily. Qty: 30 tablet, Refills: 6    nitroGLYCERIN (NITRODUR - DOSED IN MG/24 HR) 0.2 mg/hr patch Place 1 patch (0.2 mg total) onto the skin daily. Qty: 30 patch, Refills: 6    nitroGLYCERIN (NITROSTAT) 0.4 MG SL tablet Place 1 tablet (0.4 mg total) under the tongue every 5 (five) minutes x 3 doses as needed for chest pain. Qty: 25 tablet, Refills: 4      CONTINUE these medications which have NOT CHANGED   Details  calcium acetate (PHOSLO) 667 MG capsule Take 667  mg by mouth every morning.     Cyanocobalamin (VITAMIN B 12 PO) Take 1 tablet by mouth daily.    cycloSPORINE (RESTASIS) 0.05 % ophthalmic emulsion Place 1 drop into both eyes 2 (two) times daily.     fish oil-omega-3 fatty acids 1000 MG capsule Take 1 g by mouth daily.     gabapentin (NEURONTIN) 300 MG capsule Take 300 mg by mouth every evening.    lanthanum (FOSRENOL) 1000 MG chewable tablet Chew 1,000 mg by mouth 3 (three) times daily with meals.    midodrine (PROAMATINE) 2.5 MG tablet Take 2.5 mg by  mouth 2 (two) times daily with a meal. TAKE ONE TABLET IN THE am AND SECOND DOSE 6 HOURS LATER    Multiple Vitamins-Minerals (OCUVITE PRESERVISION PO) Take 1 tablet by mouth daily.    pantoprazole (PROTONIX) 40 MG tablet Take 1 tablet (40 mg total) by mouth 2 (two) times daily. Qty: 60 tablet, Refills: 0    rOPINIRole (REQUIP) 2 MG tablet Take 1 tablet (2 mg total) by mouth 2 (two) times daily. Qty: 30 tablet, Refills: 0      STOP taking these medications     predniSONE (STERAPRED UNI-PAK 21 TAB) 10 MG (21) TBPK tablet            Outstanding Labs/Studies   None   Duration of Discharge Encounter   Greater than 30 minutes including physician time.  Signed, Nada BoozerLaura Ingold NP 01/13/2016, 4:04 PM

## 2016-01-13 NOTE — Progress Notes (Addendum)
Patient Name: Nathaniel Steele Date of Encounter: 01/13/2016  [facility]  Hospital Problem List     Principal Problem:   Chest pain Active Problems:   End-stage renal disease (ESRD) (HCC)   SLE (systemic lupus erythematosus) (HCC)   S/P CABG x 2: (LIMA-LAD  SVG - RCA)   Permanent atrial fibrillation (HCC)   Pacemaker, MDT implanted 08/06, generator change April 2015   Chronic diastolic heart failure (HCC)   History of aortic valve replacement with bioprosthetic valve   Chest pain with moderate risk of acute coronary syndrome   Subjective   Minimal chest pain today when he lays on his back or on the left side, currently in HD.  Inpatient Medications    Scheduled Meds: . aspirin EC  81 mg Oral Daily  . calcium acetate  667 mg Oral q morning - 10a  . cycloSPORINE  1 drop Both Eyes BID  . gabapentin  300 mg Oral QPM  . heparin  5,000 Units Subcutaneous Q8H  . lanthanum  1,000 mg Oral TID WC  . metoprolol tartrate  12.5 mg Oral BID  . midodrine  2.5 mg Oral BID WC  . nitroGLYCERIN  0.2 mg Transdermal Q24H  . omega-3 acid ethyl esters  1 g Oral Daily  . pantoprazole  40 mg Oral BID  . predniSONE  10 mg Oral QPC supper  . predniSONE  10 mg Oral Q breakfast  . rOPINIRole  2 mg Oral TID   Continuous Infusions:  PRN Meds: sodium chloride, sodium chloride, acetaminophen, alteplase, heparin, lidocaine (PF), lidocaine-prilocaine, nitroGLYCERIN, ondansetron (ZOFRAN) IV, pentafluoroprop-tetrafluoroeth   Vital Signs    Vitals:   01/13/16 1000 01/13/16 1030 01/13/16 1100 01/13/16 1126  BP: (!) 103/47 (!) 108/52 (!) 115/57 (P) 118/64  Pulse: 71 77 74 (P) 75  Resp:    (P) 15  Temp:    (P) 97.6 F (36.4 C)  TempSrc:    (P) Oral  SpO2:    (P) 96%  Weight:      Height:        Intake/Output Summary (Last 24 hours) at 01/13/16 1239 Last data filed at 01/12/16 2200  Gross per 24 hour  Intake              120 ml  Output                0 ml  Net               120 ml   Filed Weights   01/12/16 0300 01/13/16 0520 01/13/16 0720  Weight: 155 lb (70.3 kg) 154 lb 6.4 oz (70 kg) 154 lb 1.6 oz (69.9 kg)    Physical Exam   GEN: Thin male,  in no acute distress.  HEENT: normocephalic, sclera clear, mucus membranes moist.  Neck: Supple, + JVD, or masses. Cardiac: RRR, no murmurs, rubs, or gallops. No clubbing, cyanosis, edema.  Radials/DP/PT 1+ and equal bilaterally.  Respiratory:  Respirations regular and unlabored, clear to auscultation bilaterally without rales, rhonchi or wheezes. GI: Abd -Soft, nontender, nondistended, BS + x 4. MS: no deformity or atrophy. Skin: warm and dry, brisk capillary refill, no obvious rash Neuro:  Alert and oriented X 3 MAE, follows commands Psych: answers questions appropriately,Normal and pleasant affect.   Labs    CBC  Recent Labs  01/11/16 1322 01/13/16 0755  WBC 6.5 6.3  HGB 8.4* 8.8*  HCT 25.8* 26.4*  MCV 93.1 91.7  PLT 104*  118*   Basic Metabolic Panel  Recent Labs  01/12/16 0020 01/13/16 0755  NA 137 136  K 3.1* 4.7  CL 96* 98*  CO2 30 25  GLUCOSE 128* 99  BUN 24* 63*  CREATININE 2.51* 4.27*  CALCIUM 9.2 9.4  MG 2.0  --   PHOS  --  5.4*   Liver Function Tests  Recent Labs  01/12/16 0020 01/13/16 0755  AST 22  --   ALT 13*  --   ALKPHOS 59  --   BILITOT 0.8  --   PROT 6.3*  --   ALBUMIN 3.5 3.6   No results for input(s): LIPASE, AMYLASE in the last 72 hours. Cardiac Enzymes  Recent Labs  01/11/16 1322 01/11/16 1853 01/12/16 0020  TROPONINI 0.05* 0.05* 0.05*   Telemetry    Pacing with NSVT 15 beats last PM - Personally Reviewed  ECG    A fib with V pacing - Personally Reviewed  Radiology    Nm Myocar Multi W/spect W/wall Motion / Ef  Result Date: 01/12/2016 CLINICAL DATA:  Chest pain EXAM: MYOCARDIAL IMAGING WITH SPECT (REST AND PHARMACOLOGIC-STRESS) GATED LEFT VENTRICULAR WALL MOTION STUDY LEFT VENTRICULAR EJECTION FRACTION TECHNIQUE: Standard  myocardial SPECT imaging was performed after resting intravenous injection of 10 mCi [id number] tetrofosmin. Subsequently, intravenous infusion of Lexiscan was performed under the supervision of the Cardiology staff. At peak effect of the drug, 30 mCi Tc-10063m tetrofosmin was injected intravenously and standard myocardial SPECT imaging was performed. Quantitative gated imaging was also performed to evaluate left ventricular wall motion, and estimate left ventricular ejection fraction. COMPARISON:  None. FINDINGS: Perfusion: There is attenuation artifact just inferior to the apex. No definite perfusion defects are identified. No stress-induced ischemia. Wall Motion: Severe global hypokinesis Left Ventricular Ejection Fraction: 15 % End diastolic volume 125 ml End systolic volume 107 ml IMPRESSION: 1. No perfusion defects allowing for inferior apical attenuation artifact. 2. Global hypokinesis. 3. Left ventricular ejection fraction 15% 4. Non invasive risk stratification*: High risk. *2012 Appropriate Use Criteria for Coronary Revascularization Focused Update: J Am Coll Cardiol. 2012;59(9):857-881. http://content.dementiazones.comonlinejacc.org/article.aspx?articleid=1201161 Electronically Signed   By: Jolaine ClickArthur  Hoss M.D.   On: 01/12/2016 11:42    Cardiac Studies   nuc pending  Patient Profile     82 y/o [place] [facility] [place] with a history of ESRD-on HD, CAD and AOV disease-s/p CABG and tissue AVR 2013, CAF- not a candidate for anticoagulation secondary to GI bleeding history, SSS s/p MDT PTVDP-last gen change 2015, and orthostatic hypotension limiting medical Rx.   Now with chest pain.    Assessment & Plan   Chest pain - nuc completed, minimal rise in troponin with ESRD and flat 0.05, per Dr. Jens Somrenshaw " Lexiscan nuclear study  If low risk would treat medically. If high risk he may need to be challenged with dual antiplatelet therapy and if he tolerates proceed with catheterization to assess lesion possibly  amenable to PCI. We will add enteric-coated aspirin 81 mg daily. He is not a great candidate for antianginals given history of orthostatic hypotension. He is on midodrine. I will try low-dose metoprolol to see if he tolerates  The patient has atypical chest pain suspicious for pericarditis, we will start colchicine 0.6 mg po BID.  Nuclear stress test yesterday was read as high risk sec to low LVEF, no ischemia. New echo is pending.  The patient had a run of nsVT - 16 beats total on 12/22 that was asymptomatic, now on metoprolol.  Potassium corrected, today 4.7. This is a difficult decision making, ideally he should get a cath, we will wait for an echo, however he is a high risk for bleeding, h/o prior GIB, in permanent a-fib not on anticoagulation sec to prior GIB. I have had a discussion with the patient and doesn't wish to have a cath. We will plan for a discharge later today.   Tobias Alexander 01/13/2016

## 2016-01-15 ENCOUNTER — Encounter: Payer: Self-pay | Admitting: Hematology

## 2016-01-15 ENCOUNTER — Ambulatory Visit (INDEPENDENT_AMBULATORY_CARE_PROVIDER_SITE_OTHER): Payer: Medicare Other | Admitting: *Deleted

## 2016-01-15 ENCOUNTER — Telehealth: Payer: Self-pay | Admitting: Hematology

## 2016-01-15 ENCOUNTER — Telehealth: Payer: Self-pay | Admitting: Cardiology

## 2016-01-15 ENCOUNTER — Telehealth: Payer: Self-pay | Admitting: Physician Assistant

## 2016-01-15 DIAGNOSIS — I495 Sick sinus syndrome: Secondary | ICD-10-CM

## 2016-01-15 LAB — HEPATITIS B CORE ANTIBODY, TOTAL: HEP B C TOTAL AB: NEGATIVE

## 2016-01-15 LAB — HEPATITIS B SURFACE ANTIBODY,QUALITATIVE: Hep B S Ab: REACTIVE

## 2016-01-15 NOTE — Telephone Encounter (Signed)
Pt's cld to setup an appt for his father. Appt scheduled w/Kale on 02/14/16. He states that the pt can only do Tues or Thurs appts due to dialysis on M,W,F. Aware to have his father arrive 30 minutes early. Demographics verified. Letter mailed.

## 2016-01-15 NOTE — Telephone Encounter (Signed)
LMOVM reminding pt to send remote transmission.   

## 2016-01-15 NOTE — Telephone Encounter (Signed)
Received records [facility] for appointment on 01/23/16 with Azalee CourseHao Meng, PA.  Records given to [employer] (medical records) for Hao's schedule on 01/23/16. lp

## 2016-01-16 NOTE — Progress Notes (Signed)
Remote pacemaker transmission.   

## 2016-01-17 LAB — CUP PACEART REMOTE DEVICE CHECK
Battery Remaining Longevity: 116 mo
Battery Voltage: 2.79 V
Implantable Lead Implant Date: 20060831
Implantable Lead Location: 753859
Implantable Lead Model: 5092
Implantable Lead Model: 5594
Implantable Pulse Generator Implant Date: 20150421
Lead Channel Pacing Threshold Amplitude: 1.25 V
Lead Channel Pacing Threshold Pulse Width: 0.4 ms
MDC IDC LEAD IMPLANT DT: 20060831
MDC IDC LEAD LOCATION: 753860
MDC IDC MSMT BATTERY IMPEDANCE: 235 Ohm
MDC IDC MSMT LEADCHNL RA IMPEDANCE VALUE: 67 Ohm
MDC IDC MSMT LEADCHNL RV IMPEDANCE VALUE: 538 Ohm
MDC IDC SESS DTM: 20171226184811
MDC IDC SET LEADCHNL RV PACING AMPLITUDE: 2.5 V
MDC IDC SET LEADCHNL RV PACING PULSEWIDTH: 0.4 ms
MDC IDC SET LEADCHNL RV SENSING SENSITIVITY: 4 mV
MDC IDC STAT BRADY RV PERCENT PACED: 99 %

## 2016-01-18 ENCOUNTER — Encounter: Payer: Self-pay | Admitting: Cardiology

## 2016-01-22 ENCOUNTER — Ambulatory Visit (INDEPENDENT_AMBULATORY_CARE_PROVIDER_SITE_OTHER): Payer: Medicare Other | Admitting: Physician Assistant

## 2016-01-22 ENCOUNTER — Encounter: Payer: Self-pay | Admitting: Physician Assistant

## 2016-01-22 VITALS — BP 102/56 | HR 88 | Ht 67.5 in | Wt 159.0 lb

## 2016-01-22 DIAGNOSIS — I480 Paroxysmal atrial fibrillation: Secondary | ICD-10-CM

## 2016-01-22 DIAGNOSIS — R079 Chest pain, unspecified: Secondary | ICD-10-CM

## 2016-01-22 DIAGNOSIS — I25708 Atherosclerosis of coronary artery bypass graft(s), unspecified, with other forms of angina pectoris: Secondary | ICD-10-CM

## 2016-01-22 DIAGNOSIS — I959 Hypotension, unspecified: Secondary | ICD-10-CM

## 2016-01-22 MED ORDER — NITROGLYCERIN 0.2 MG/HR TD PT24: 0.2000 mg | MEDICATED_PATCH | TRANSDERMAL | 3 refills | Status: DC

## 2016-01-22 MED ORDER — COLCHICINE 0.6 MG PO TABS: 0.6000 mg | ORAL_TABLET | Freq: Two times a day (BID) | ORAL | 3 refills | Status: DC

## 2016-01-22 MED ORDER — NITROGLYCERIN 0.4 MG SL SUBL: 0.4000 mg | SUBLINGUAL_TABLET | SUBLINGUAL | 4 refills | Status: AC | PRN

## 2016-01-22 NOTE — Patient Instructions (Signed)
Medication Instructions:  STOP METOPROLOL   If you need a refill on your cardiac medications before your next appointment, please call your pharmacy.  Labwork: NONE  Follow-Up: Your physician recommends that you schedule a follow-up appointment in: 1ST AVAILABLE WITH DR CROITORU   HAPPY NEW YEAR!!    Thank you for choosing [facility]!!    Chiron Campione, LPN

## 2016-01-22 NOTE — Progress Notes (Addendum)
Cardiology Office Note   Date:  01/22/2016   ID:  GLENDEN ROSSELL, DOB 10/12/1926, MRN [id number]  PCP:  Delorse Lek, MD  Cardiologist:  Dr Chrisandra Netters, PA-C   Chief Complaint  Patient presents with  . Follow-up    dyspnea on exertion, shortness of breath, went to ER    History of Present Illness: Nathaniel Steele is a 83 y.o. male with a history of ESRD on HD, SLE, CABG w/ LIMA-LAD & SVG-RCA 2013, perm afib not anticoagulated 2nd hx GIB, AVR w/ bioprosthetic valve, D-CHF, MDT PPM w/ gen change 2015, orthostatic hypotension  Admit 12/22-12/24 w/ CP & minimal elevation in troponin>MV was high-risk due to low EF>echo w/ nl EF>>dx pericarditis, started on colchicine. 16 bts NSVT seen on telemetry  [name] presents for post-hospital follow up.He is here today with his wife and his son  He is still very weak. Cannot walk much due to leg weakness. He will also get SOB and get CP between his shoulder blades and in his neck. These symptoms are similar to what was going on prior to his CABG. He has tried SL NTG twice, with resolution of his pain. It will also get better with rest. He does not feel the colchicine has helped.   At times, he can walk slowly and do ok. However, after HD and multiple other times, he will get the SOB. Now has to leave HD in a wheelchair.  He is also having problems with low BP. The lowest one recently was 74 systolic. His family wonders if there is correlation between his low blood pressure and him being on metoprolol or nitroglycerin patch.   Past Medical History:  Diagnosis Date  . Acute on chronic diastolic CHF (congestive heart failure), NYHA class 3 (HCC) 12/07/2013  . Anemia   . Arthritis   . Benign prostatic hypertrophy   . Blood dyscrasia    one time had low platlet count  . Cellulitis   . Chronic kidney disease    not on dialysis yet  . Claudication (HCC) 02/19/2012   LE doppler no evidence of arterial insufficiency, lower  extremities demonstrate normal values  w/ no evidence of insufficiency  . Constipation   . Coronary artery disease 05/14/2010   stress test - no scintigraphic evidence of inducible myocardial ischemia,; normal study  . Dysrhythmia    atrial fib  . Hypertension   . Lupus nephritis (HCC)   . Pacemaker 09/19/2004   implanted  . Pacemaker   . Pericardial effusion 03/12/2011   echo EF 55-60%  . Restless leg syndrome   . Shortness of breath with exertion  . Shoulder joint pain   . SSS (sick sinus syndrome) (HCC) 08/27/2011   echo EF >55%, severe LAE, moderate RAE, tissue AVR w/ gradients 35 and    Past Surgical History:  Procedure Laterality Date  . AORTIC VALVE REPLACEMENT  02/24/2011   pericardial tissue valve [facility] Ease  . APPENDECTOMY    . AV FISTULA PLACEMENT  02/24/2011  . CARDIAC CATHETERIZATION  02/13/2011   mod/severe aortic valve stenosis w peak to peak gradient 25-32 mmHg,  70% stenosis LAD, 30-40%proximal followed by 90% sstenosis in RCA prior to anterior RV margin branch  . CARDIOVERSION N/A 12/09/2013   Procedure: CARDIOVERSION;  Surgeon: Thurmon Fair, MD;  Location: [facility] ENDOSCOPY;  Service: Cardiovascular;  Laterality: N/A;  . CORONARY ARTERY BYPASS GRAFT  02/24/2011   LIMA to LAD, SVG to  distal RCA  . ESOPHAGOGASTRODUODENOSCOPY N/A 06/21/2013   Procedure: ESOPHAGOGASTRODUODENOSCOPY (EGD);  Surgeon: Theda Belfast, MD;  Location: [facility] ENDOSCOPY;  Service: Endoscopy;  Laterality: N/A;  . GIVENS CAPSULE STUDY N/A 06/21/2013   Procedure: GIVENS CAPSULE STUDY;  Surgeon: Theda Belfast, MD;  Location: [facility] ENDOSCOPY;  Service: Endoscopy;  Laterality: N/A;  . GIVENS CAPSULE STUDY N/A 03/25/2014   Procedure: GIVENS CAPSULE STUDY;  Surgeon: Theda Belfast, MD;  Location: [facility] ENDOSCOPY;  Service: Endoscopy;  Laterality: N/A;  . HERNIA REPAIR    . LEFT AND RIGHT HEART CATHETERIZATION WITH CORONARY ANGIOGRAM N/A 02/13/2011   Procedure: LEFT AND RIGHT HEART CATHETERIZATION WITH  CORONARY ANGIOGRAM;  Surgeon: Lennette Bihari, MD;  Location: [facility] CATH LAB;  Service: Cardiovascular;  Laterality: N/A;  . LUMBAR LAMINECTOMY/DECOMPRESSION MICRODISCECTOMY Right 04/16/2012   Procedure: DECOMPRESSIVE L4 - L5/ MICRODISCECTOMY ON THE RIGHT 1 LEVEL;  Surgeon: Jacki Cones, MD;  Location: [facility] ORS;  Service: Orthopedics;  Laterality: Right;  . PACEMAKER INSERTION  2006   [employer] EnRhythm  . PERMANENT PACEMAKER GENERATOR CHANGE N/A 05/10/2013   Procedure: PERMANENT PACEMAKER GENERATOR CHANGE;  Surgeon: Thurmon Fair, MD;  Location: [facility] CATH LAB;  Service: Cardiovascular;  Laterality: N/A;    Current Outpatient Prescriptions  Medication Sig Dispense Refill  . aspirin EC 81 MG EC tablet Take 1 tablet (81 mg total) by mouth daily.    . colchicine 0.6 MG tablet Take 1 tablet (0.6 mg total) by mouth 2 (two) times daily. 60 tablet 1  . Cyanocobalamin (VITAMIN B 12 PO) Take 1 tablet by mouth daily.    . cycloSPORINE (RESTASIS) 0.05 % ophthalmic emulsion Place 1 drop into both eyes 2 (two) times daily.     . fish oil-omega-3 fatty acids 1000 MG capsule Take 1 g by mouth daily.     Marland Kitchen gabapentin (NEURONTIN) 300 MG capsule Take 300 mg by mouth every evening.    Marland Kitchen lanthanum (FOSRENOL) 1000 MG chewable tablet Chew 1,000 mg by mouth 3 (three) times daily with meals.    . metoprolol tartrate (LOPRESSOR) 25 MG tablet Take 0.5 tablets (12.5 mg total) by mouth 2 (two) times daily. 30 tablet 6  . midodrine (PROAMATINE) 2.5 MG tablet Take 2.5 mg by mouth 2 (two) times daily with a meal. TAKE ONE TABLET IN THE am AND SECOND DOSE 6 HOURS LATER    . Multiple Vitamins-Minerals (OCUVITE PRESERVISION PO) Take 1 tablet by mouth daily.    . nitroGLYCERIN (NITRODUR - DOSED IN MG/24 HR) 0.2 mg/hr patch Place 1 patch (0.2 mg total) onto the skin daily. 30 patch 6  . nitroGLYCERIN (NITROSTAT) 0.4 MG SL tablet Place 1 tablet (0.4 mg total) under the tongue every 5 (five) minutes x 3 doses as needed for chest pain.  25 tablet 4  . pantoprazole (PROTONIX) 40 MG tablet Take 1 tablet (40 mg total) by mouth 2 (two) times daily. 60 tablet 0  . rOPINIRole (REQUIP) 2 MG tablet Take 2 mg by mouth 3 (three) times daily.     No current facility-administered medications for this visit.     Allergies:   Rosuvastatin calcium; Statins; Aspirin; Feraheme [ferumoxytol]; and Penicillins    Social History:  The patient  reports that he quit smoking about 58 years ago. His smoking use included Cigarettes. He has never used smokeless tobacco. He reports that he does not drink alcohol or use drugs.   Family History:  The patient's family history includes Hypertension in his mother; Kidney  disease in his brother.    ROS:  Please see the history of present illness. All other systems are reviewed and negative.    PHYSICAL EXAM: VS:  BP (!) 102/56 (BP Location: Right Arm, Patient Position: Sitting, Cuff Size: Small)   Pulse 88   Ht 5' 7.5" (1.715 m)   Wt 159 lb (72.1 kg)   BMI 24.54 kg/m  , BMI Body mass index is 24.54 kg/m. GEN: Well nourished, well developed, male in no acute distress  HEENT: normal for age  Neck: no JVD, no carotid bruit, no masses Cardiac: Irreg R&R; 2/6 systolic and short diastolic murmurs, no rubs, or gallops Respiratory:  clear to auscultation bilaterally, normal work of breathing GI: soft, nontender, nondistended, + BS MS: no deformity or atrophy; 1+ lower extremity edema; distal pulses are 2+ in all 4 extremities   Skin: warm and dry, no rash Neuro:  Strength and sensation are intact Psych: euthymic mood, full affect  MYOVIEW: 01/12/2016 IMPRESSION: 1. No perfusion defects allowing for inferior apical attenuation artifact. 2. Global hypokinesis. 3. Left ventricular ejection fraction 15% 4. Non invasive risk stratification*: High risk.   EKG:  EKG is not ordered today.  ECHO: 01/13/2016 - Left ventricle: The cavity size was normal. There was moderate   concentric hypertrophy.  Systolic function was normal. The   estimated ejection fraction was in the range of 55% to 60%. Wall   motion was normal; there were no regional wall motion   abnormalities. Features are consistent with a pseudonormal left   ventricular filling pattern, with concomitant abnormal relaxation   and increased filling pressure (grade 2 diastolic dysfunction).   Doppler parameters are consistent with elevated ventricular   end-diastolic filling pressure. - Aortic valve: A bioprosthetic valve is present in the aortic   position functioning normally. Normal transaortic gradients.   There is moderate aortic regurgitation, no obvious paravalvular   leak. There was moderate regurgitation. - Mitral valve: Calcified annulus. Severely thickened, severely   calcified leaflets . Mobility was restricted. There was mild regurgitation. - Left atrium: The atrium was severely dilated. - Right ventricle: The cavity size was mildly dilated. Wall   thickness was normal. Systolic function was mildly reduced. - Right atrium: The atrium was moderately dilated. Pacer wire or   catheter noted in right atrium. - Tricuspid valve: There was mild regurgitation. - Pulmonary arteries: Systolic pressure was within the normal range. - Inferior vena cava: The vessel was dilated. The respirophasic   diameter changes were blunted (< 50%), consistent with elevated   central venous pressure. - Pericardium, extracardiac: There was no pericardial effusion.   Recent Labs: 01/12/2016: ALT 13; Magnesium 2.0 01/13/2016: BUN 63; Creatinine, Ser 4.27; Hemoglobin 8.8; Platelets 118; Potassium 4.7; Sodium 136    Lipid Panel    Component Value Date/Time   CHOL 190 07/08/2012 0955   TRIG 164 (H) 07/08/2012 0955   HDL 28 (L) 07/08/2012 0955   CHOLHDL 6.8 07/08/2012 0955   VLDL 33 07/08/2012 0955   LDLCALC 129 (H) 07/08/2012 0955     Wt Readings from Last 3 Encounters:  01/22/16 159 lb (72.1 kg)  01/13/16 149 lb 7.6 oz (67.8  kg)  10/16/15 157 lb 3.2 oz (71.3 kg)     Other studies Reviewed: Additional studies/ records that were reviewed today include: Office notes, hospital records and testing.  ASSESSMENT AND PLAN:  1.  Chest pain: He has not gotten any significant improvement from the colchicine. His symptoms are not completely  consistent with exertion, but seemed to have an exertional component. They remind him of his pre-CABG symptoms, and are relieved with sublingual nitroglycerin.  Discuss with M.D. if further evaluation with cardiac catheterization is indicated. I am concerned about balanced ischemia. Continue the colchicine for now  2. Hypotension: He takes them that a drain for hypotension, but is still having symptoms from it. He is on 2 medications that can lower his blood pressure, metoprolol and nitroglycerin patch. Because I am concerned about angina, I will not discontinue the nitroglycerin patch for now. If his blood pressure does not improve, we will decrease or discontinue the Nitropatch.  3. Permanent atrial fibrillation: He has no indications at present that his heart rate is not controlled. See how his heart rate does off the metoprolol   Current medicines are reviewed at length with the patient today.  The patient has concerns regarding medicines. Concerns were addressed  The following changes have been made:  Discontinue metoprolol  Labs/ tests ordered today include:  No orders of the defined types were placed in this encounter.    Disposition:   FU with Dr. Royann Shivers  Signed, Leanna Battles  01/22/2016 5:28 PM    [facility] Phone: [phone]; Fax: [phone]  This note was written with the assistance of speech recognition software. Please excuse any transcriptional errors.  Addendum: Dr Royann Shivers agreed that cath was the right thing to do. The risks and benefits of a cardiac catheterization including, but not limited to, death, stroke, MI,  kidney damage and bleeding were discussed with the patient who indicates understanding and agrees to proceed.   It will be scheduled on a non-HD day. He will get labs at his PCP office today.   Theodore Demark, Cordelia Poche 01/24/2016 9:21 AM Beeper [phone]

## 2016-01-23 ENCOUNTER — Telehealth: Payer: Self-pay

## 2016-01-23 ENCOUNTER — Ambulatory Visit: Payer: Medicare Other | Admitting: Physician Assistant

## 2016-01-23 DIAGNOSIS — I4821 Permanent atrial fibrillation: Secondary | ICD-10-CM

## 2016-01-23 DIAGNOSIS — Z79899 Other long term (current) drug therapy: Secondary | ICD-10-CM

## 2016-01-23 DIAGNOSIS — Z01818 Encounter for other preprocedural examination: Secondary | ICD-10-CM

## 2016-01-23 NOTE — Progress Notes (Signed)
[name],  I agree with you that cardiac cath is the right thing to do - as long as Mr. Nathaniel Steele agrees to that. Could you please schedule with interventional Cardiologist? MCr

## 2016-01-23 NOTE — Telephone Encounter (Signed)
LMTCB TO DISCUSS HEART CATH WITH PT.

## 2016-01-24 ENCOUNTER — Other Ambulatory Visit: Payer: Self-pay

## 2016-01-24 DIAGNOSIS — Z01818 Encounter for other preprocedural examination: Secondary | ICD-10-CM

## 2016-01-24 DIAGNOSIS — I4821 Permanent atrial fibrillation: Secondary | ICD-10-CM

## 2016-01-24 NOTE — Telephone Encounter (Signed)
CALL PT ON Tuesday-To discuss:  2. Diet: Do not eat or drink anything after midnight prior to your procedure except sips of water to take medications.  3. Labs: You will need to have blood drawn on Thursday, January 4 at [facility]. You do not need to be fasting.  4On the morning of your procedure, take your Aspirin and any morning medicines.  You may use sips of water.  5. Plan for one night stay--bring personal belongings.  6. Bring a current list of your medications and current insurance cards.  7. You MUST have a responsible person to drive you home.  8. Someone MUST be with you the first 24 hours after you arrive home or your discharge will be delayed.  9. Please wear clothes that are easy to get on and off and wear slip-on shoes.

## 2016-01-24 NOTE — Telephone Encounter (Signed)
Spoke with pt and discussed L heart cath to be done 01-31-16

## 2016-01-24 NOTE — Telephone Encounter (Signed)
Mr. Nathaniel Steele is returning a call .  Thanks

## 2016-01-27 ENCOUNTER — Emergency Department (HOSPITAL_COMMUNITY): Payer: Medicare Other

## 2016-01-27 ENCOUNTER — Encounter (HOSPITAL_COMMUNITY): Payer: Self-pay | Admitting: Certified Nurse Midwife

## 2016-01-27 ENCOUNTER — Inpatient Hospital Stay (HOSPITAL_COMMUNITY)
Admission: EM | Admit: 2016-01-27 | Discharge: 2016-02-01 | DRG: 280 | Disposition: A | Discharge status: Home Health | Payer: Medicare Other | Attending: Internal Medicine | Admitting: Internal Medicine

## 2016-01-27 DIAGNOSIS — G2581 Restless legs syndrome: Secondary | ICD-10-CM | POA: Diagnosis present

## 2016-01-27 DIAGNOSIS — Z886 Allergy status to analgesic agent status: Secondary | ICD-10-CM

## 2016-01-27 DIAGNOSIS — R778 Other specified abnormalities of plasma proteins: Secondary | ICD-10-CM

## 2016-01-27 DIAGNOSIS — K5521 Angiodysplasia of colon with hemorrhage: Secondary | ICD-10-CM | POA: Diagnosis present

## 2016-01-27 DIAGNOSIS — N400 Enlarged prostate without lower urinary tract symptoms: Secondary | ICD-10-CM | POA: Diagnosis present

## 2016-01-27 DIAGNOSIS — R000 Tachycardia, unspecified: Secondary | ICD-10-CM | POA: Diagnosis present

## 2016-01-27 DIAGNOSIS — I350 Nonrheumatic aortic (valve) stenosis: Secondary | ICD-10-CM | POA: Diagnosis present

## 2016-01-27 DIAGNOSIS — I951 Orthostatic hypotension: Secondary | ICD-10-CM | POA: Diagnosis present

## 2016-01-27 DIAGNOSIS — N186 End stage renal disease: Secondary | ICD-10-CM | POA: Diagnosis present

## 2016-01-27 DIAGNOSIS — R079 Chest pain, unspecified: Secondary | ICD-10-CM | POA: Diagnosis not present

## 2016-01-27 DIAGNOSIS — I214 Non-ST elevation (NSTEMI) myocardial infarction: Principal | ICD-10-CM | POA: Diagnosis present

## 2016-01-27 DIAGNOSIS — I482 Chronic atrial fibrillation: Secondary | ICD-10-CM | POA: Diagnosis present

## 2016-01-27 DIAGNOSIS — E785 Hyperlipidemia, unspecified: Secondary | ICD-10-CM | POA: Diagnosis present

## 2016-01-27 DIAGNOSIS — Z880 Allergy status to penicillin: Secondary | ICD-10-CM

## 2016-01-27 DIAGNOSIS — I2510 Atherosclerotic heart disease of native coronary artery without angina pectoris: Secondary | ICD-10-CM | POA: Diagnosis not present

## 2016-01-27 DIAGNOSIS — D500 Iron deficiency anemia secondary to blood loss (chronic): Secondary | ICD-10-CM | POA: Diagnosis present

## 2016-01-27 DIAGNOSIS — I447 Left bundle-branch block, unspecified: Secondary | ICD-10-CM | POA: Diagnosis present

## 2016-01-27 DIAGNOSIS — Y831 Surgical operation with implant of artificial internal device as the cause of abnormal reaction of the patient, or of later complication, without mention of misadventure at the time of the procedure: Secondary | ICD-10-CM | POA: Diagnosis present

## 2016-01-27 DIAGNOSIS — I132 Hypertensive heart and chronic kidney disease with heart failure and with stage 5 chronic kidney disease, or end stage renal disease: Secondary | ICD-10-CM | POA: Diagnosis present

## 2016-01-27 DIAGNOSIS — K219 Gastro-esophageal reflux disease without esophagitis: Secondary | ICD-10-CM | POA: Diagnosis present

## 2016-01-27 DIAGNOSIS — R531 Weakness: Secondary | ICD-10-CM | POA: Diagnosis present

## 2016-01-27 DIAGNOSIS — M1990 Unspecified osteoarthritis, unspecified site: Secondary | ICD-10-CM | POA: Diagnosis present

## 2016-01-27 DIAGNOSIS — D696 Thrombocytopenia, unspecified: Secondary | ICD-10-CM | POA: Diagnosis present

## 2016-01-27 DIAGNOSIS — R0902 Hypoxemia: Secondary | ICD-10-CM | POA: Diagnosis present

## 2016-01-27 DIAGNOSIS — I272 Pulmonary hypertension, unspecified: Secondary | ICD-10-CM | POA: Diagnosis present

## 2016-01-27 DIAGNOSIS — I5032 Chronic diastolic (congestive) heart failure: Secondary | ICD-10-CM | POA: Diagnosis present

## 2016-01-27 DIAGNOSIS — G8929 Other chronic pain: Secondary | ICD-10-CM | POA: Diagnosis present

## 2016-01-27 DIAGNOSIS — R7989 Other specified abnormal findings of blood chemistry: Secondary | ICD-10-CM

## 2016-01-27 DIAGNOSIS — T82857A Stenosis of cardiac prosthetic devices, implants and grafts, initial encounter: Secondary | ICD-10-CM | POA: Diagnosis present

## 2016-01-27 DIAGNOSIS — D62 Acute posthemorrhagic anemia: Secondary | ICD-10-CM | POA: Diagnosis present

## 2016-01-27 DIAGNOSIS — H409 Unspecified glaucoma: Secondary | ICD-10-CM | POA: Diagnosis present

## 2016-01-27 DIAGNOSIS — I25119 Atherosclerotic heart disease of native coronary artery with unspecified angina pectoris: Secondary | ICD-10-CM

## 2016-01-27 DIAGNOSIS — Z87891 Personal history of nicotine dependence: Secondary | ICD-10-CM

## 2016-01-27 DIAGNOSIS — Z950 Presence of cardiac pacemaker: Secondary | ICD-10-CM

## 2016-01-27 DIAGNOSIS — Z951 Presence of aortocoronary bypass graft: Secondary | ICD-10-CM

## 2016-01-27 DIAGNOSIS — R55 Syncope and collapse: Secondary | ICD-10-CM | POA: Diagnosis present

## 2016-01-27 DIAGNOSIS — D649 Anemia, unspecified: Secondary | ICD-10-CM

## 2016-01-27 DIAGNOSIS — Z8249 Family history of ischemic heart disease and other diseases of the circulatory system: Secondary | ICD-10-CM

## 2016-01-27 DIAGNOSIS — I5033 Acute on chronic diastolic (congestive) heart failure: Secondary | ICD-10-CM | POA: Diagnosis present

## 2016-01-27 DIAGNOSIS — M3214 Glomerular disease in systemic lupus erythematosus: Secondary | ICD-10-CM | POA: Diagnosis present

## 2016-01-27 DIAGNOSIS — I4821 Permanent atrial fibrillation: Secondary | ICD-10-CM | POA: Diagnosis present

## 2016-01-27 DIAGNOSIS — Z992 Dependence on renal dialysis: Secondary | ICD-10-CM

## 2016-01-27 DIAGNOSIS — N2581 Secondary hyperparathyroidism of renal origin: Secondary | ICD-10-CM | POA: Diagnosis present

## 2016-01-27 DIAGNOSIS — Z7982 Long term (current) use of aspirin: Secondary | ICD-10-CM

## 2016-01-27 DIAGNOSIS — Z888 Allergy status to other drugs, medicaments and biological substances status: Secondary | ICD-10-CM

## 2016-01-27 DIAGNOSIS — Z841 Family history of disorders of kidney and ureter: Secondary | ICD-10-CM

## 2016-01-27 HISTORY — DX: Personal history of other diseases of the circulatory system: Z86.79

## 2016-01-27 HISTORY — DX: End stage renal disease: N18.6

## 2016-01-27 LAB — CBG MONITORING, ED: GLUCOSE-CAPILLARY: 115 mg/dL — AB (ref 65–99)

## 2016-01-27 LAB — I-STAT TROPONIN, ED: Troponin i, poc: 0.1 ng/mL (ref 0.00–0.08)

## 2016-01-27 LAB — CBC
HCT: 22.7 % — ABNORMAL LOW (ref 39.0–52.0)
HEMOGLOBIN: 7.6 g/dL — AB (ref 13.0–17.0)
MCH: 31.9 pg (ref 26.0–34.0)
MCHC: 33.5 g/dL (ref 30.0–36.0)
MCV: 95.4 fL (ref 78.0–100.0)
PLATELETS: 77 10*3/uL — AB (ref 150–400)
RBC: 2.38 MIL/uL — AB (ref 4.22–5.81)
RDW: 15.8 % — ABNORMAL HIGH (ref 11.5–15.5)
WBC: 4 10*3/uL (ref 4.0–10.5)

## 2016-01-27 LAB — BASIC METABOLIC PANEL
ANION GAP: 13 (ref 5–15)
BUN: 68 mg/dL — ABNORMAL HIGH (ref 6–20)
CALCIUM: 9.1 mg/dL (ref 8.9–10.3)
CO2: 26 mmol/L (ref 22–32)
CREATININE: 5.84 mg/dL — AB (ref 0.61–1.24)
Chloride: 101 mmol/L (ref 101–111)
GFR, EST AFRICAN AMERICAN: 9 mL/min — AB (ref >60)
GFR, EST NON AFRICAN AMERICAN: 8 mL/min — AB (ref >60)
Glucose, Bld: 117 mg/dL — ABNORMAL HIGH (ref 65–99)
Potassium: 3.6 mmol/L (ref 3.5–5.1)
Sodium: 140 mmol/L (ref 135–145)

## 2016-01-27 LAB — TROPONIN I
Troponin I: 0.12 ng/mL (ref <0.03)
Troponin I: 0.14 ng/mL (ref <0.03)

## 2016-01-27 LAB — PREPARE RBC (CROSSMATCH)

## 2016-01-27 LAB — HEMOGLOBIN AND HEMATOCRIT, BLOOD
HEMATOCRIT: 23.4 % — AB (ref 39.0–52.0)
Hemoglobin: 7.8 g/dL — ABNORMAL LOW (ref 13.0–17.0)

## 2016-01-27 LAB — MAGNESIUM: MAGNESIUM: 2.3 mg/dL (ref 1.7–2.4)

## 2016-01-27 LAB — PHOSPHORUS: Phosphorus: 7.9 mg/dL — ABNORMAL HIGH (ref 2.5–4.6)

## 2016-01-27 LAB — POC OCCULT BLOOD, ED: Fecal Occult Bld: POSITIVE — AB

## 2016-01-27 MED ORDER — PANTOPRAZOLE SODIUM 40 MG PO TBEC
40.0000 mg | DELAYED_RELEASE_TABLET | Freq: Two times a day (BID) | ORAL | Status: DC
  Administered 2016-01-27 – 2016-01-31 (×9): 40 mg via ORAL
  Filled 2016-01-27 (×9): qty 1

## 2016-01-27 MED ORDER — ASPIRIN 81 MG PO CHEW
81.0000 mg | CHEWABLE_TABLET | Freq: Every day | ORAL | Status: DC
  Administered 2016-01-28 – 2016-02-01 (×4): 81 mg via ORAL
  Filled 2016-01-27 (×4): qty 1

## 2016-01-27 MED ORDER — NITROGLYCERIN 0.4 MG SL SUBL: 0.4000 mg | SUBLINGUAL_TABLET | SUBLINGUAL | Status: DC | PRN

## 2016-01-27 MED ORDER — GABAPENTIN 300 MG PO CAPS
300.0000 mg | ORAL_CAPSULE | Freq: Every evening | ORAL | Status: DC
  Administered 2016-01-27 – 2016-01-31 (×6): 300 mg via ORAL
  Filled 2016-01-27 (×5): qty 1

## 2016-01-27 MED ORDER — CYCLOSPORINE 0.05 % OP EMUL
1.0000 [drp] | Freq: Two times a day (BID) | OPHTHALMIC | Status: DC
  Administered 2016-01-27 – 2016-01-31 (×9): 1 [drp] via OPHTHALMIC
  Filled 2016-01-27 (×11): qty 1

## 2016-01-27 MED ORDER — ROPINIROLE HCL 1 MG PO TABS
6.0000 mg | ORAL_TABLET | Freq: Every day | ORAL | Status: DC
  Administered 2016-01-27 – 2016-01-31 (×5): 6 mg via ORAL
  Filled 2016-01-27 (×6): qty 6

## 2016-01-27 MED ORDER — HEPARIN SODIUM (PORCINE) 5000 UNIT/ML IJ SOLN: 5000.0000 [IU] | Freq: Three times a day (TID) | INTRAMUSCULAR | Status: DC

## 2016-01-27 MED ORDER — ACETAMINOPHEN 325 MG PO TABS
650.0000 mg | ORAL_TABLET | Freq: Four times a day (QID) | ORAL | Status: DC | PRN
  Administered 2016-01-28: 650 mg via ORAL
  Filled 2016-01-27: qty 2

## 2016-01-27 MED ORDER — TRAZODONE HCL 50 MG PO TABS
25.0000 mg | ORAL_TABLET | Freq: Every evening | ORAL | Status: DC | PRN
  Administered 2016-01-30: 25 mg via ORAL
  Filled 2016-01-27: qty 1

## 2016-01-27 MED ORDER — ACETAMINOPHEN 650 MG RE SUPP: 650.0000 mg | Freq: Four times a day (QID) | RECTAL | Status: DC | PRN

## 2016-01-27 MED ORDER — SODIUM CHLORIDE 0.9% FLUSH
3.0000 mL | Freq: Two times a day (BID) | INTRAVENOUS | Status: DC
  Administered 2016-01-27 – 2016-01-30 (×8): 3 mL via INTRAVENOUS

## 2016-01-27 MED ORDER — SODIUM CHLORIDE 0.9 % IV SOLN
Freq: Once | INTRAVENOUS | Status: AC
  Administered 2016-01-27: 17:00:00 via INTRAVENOUS

## 2016-01-27 MED ORDER — MIDODRINE HCL 5 MG PO TABS
2.5000 mg | ORAL_TABLET | Freq: Two times a day (BID) | ORAL | Status: DC
  Administered 2016-01-27 – 2016-02-01 (×8): 2.5 mg via ORAL
  Filled 2016-01-27 (×10): qty 1

## 2016-01-27 MED ORDER — HEPARIN (PORCINE) IN NACL 100-0.45 UNIT/ML-% IJ SOLN
900.0000 [IU]/h | INTRAMUSCULAR | Status: DC
  Administered 2016-01-27: 900 [IU]/h via INTRAVENOUS
  Filled 2016-01-27: qty 250

## 2016-01-27 NOTE — Progress Notes (Signed)
New pt admission from ED. Pt brought to the floor in stable condition. Vitals taken. Initial Assessment done. All immediate pertinent needs to patient addressed. Patient Guide given to patient. Important safety instructions relating to hospitalization reviewed with patient. Patient verbalized understanding. Patient's wife is in bed side, pt has a fistula on his left hand, bruit and thrill can be felt and assessed. Diet order has been placed. Will continue to monitor pt.

## 2016-01-27 NOTE — Progress Notes (Signed)
Pt's troponin level is 0.10, family medicine paged, waiting for the response, pt is stable, will continue to monitor the patient.

## 2016-01-27 NOTE — H&P (Addendum)
Admit date: 01/27/2016 Referring Physician  Dr. Clarene Duke Primary Physician  Dr. Marjory Lies Primary Cardiologist  Dr. Royann Shivers Reason for Consultation  Chest pain/syncope  HPI: Nathaniel Steele is a 83 y.o. male with a history of ESRD on HD, SLE, CABG w/ LIMA-LAD & SVG-RCA 2013, perm afib not anticoagulated 2nd hx GIB, AVR w/ bioprosthetic valve, D-CHF, MDT PPM w/ gen change 2015, orthostatic hypotension  Admit 12/22-12/24 w/ CP & minimal elevation in troponin>MV was high-risk due to low EF>echo w/ nl EF>>dx pericarditis, started on colchicine. 16 bts NSVT seen on telemetry.  He was seen in the office last week and was complaining of SOB and CP between his shoulder blades and in his neck similar to what was going on prior to his CABG. SL NTG would relieve his discomfort as well as rest. He did not feel the colchicine  helped.  Apparently he could walk slowly and do ok. However, after HD and multiple other times, he will get the SOB. Now has to leave HD in a wheelchair.  He was set up for outpt cath on 01/31/2016.  Today he presented to the ER with complaints of chest pain and syncope.  He was walking to his car this am with his wife and he got very dizzy.  He says that his legs got weak and he though he was going to pass out.  His son says that they got him into the car and then he passed out with LOC for a short time.  EMS was called and he was alert when they arrived. On arrival in ER he complained of central chest pressure associated with nausea. He has had chest pressure since earlier this morning. He did take a nitroglycerin earlier this morning. He was discharged from the hospital recently on 12/24 after an admission for chest pain. At that time, he did not want to undergo heart catheterization but has since been scheduled for a catheterization later this week. He denies any fevers or cough/cold symptoms. He denies any shortness of breath.  Currently he is pain free.  He was noted to have a Hbg of  7.5 on admission labs and admits to recent dark stools.  He has been seen by GI here but was referred to [facility] for further evaluation due to location of suspected source of blood loss.      PMH:   Past Medical History:  Diagnosis Date  . Acute on chronic diastolic CHF (congestive heart failure), NYHA class 3 (HCC) 12/07/2013  . Anemia   . Arthritis   . Benign prostatic hypertrophy   . Blood dyscrasia    one time had low platlet count  . Cellulitis   . Chronic kidney disease    not on dialysis yet  . Claudication (HCC) 02/19/2012   LE doppler no evidence of arterial insufficiency, lower extremities demonstrate normal values  w/ no evidence of insufficiency  . Constipation   . Coronary artery disease 05/14/2010   stress test - no scintigraphic evidence of inducible myocardial ischemia,; normal study  . Dysrhythmia    atrial fib  . Hypertension   . Lupus nephritis (HCC)   . Pacemaker 09/19/2004   implanted  . Pacemaker   . Pericardial effusion 03/12/2011   echo EF 55-60%  . Restless leg syndrome   . Shortness of breath with exertion  . Shoulder joint pain   . SSS (sick sinus syndrome) (HCC) 08/27/2011   echo EF >55%, severe LAE, moderate RAE, tissue  AVR w/ gradients 35 and 17mmHg     PSH:   Past Surgical History:  Procedure Laterality Date  . AORTIC VALVE REPLACEMENT  02/24/2011   pericardial tissue valve [facility]  . APPENDECTOMY    . AV FISTULA PLACEMENT  02/24/2011  . CARDIAC CATHETERIZATION  02/13/2011   mod/severe aortic valve stenosis w peak to peak gradient 25-32 mmHg,  70% stenosis LAD, 30-40%proximal followed by 90% sstenosis in RCA prior to anterior RV margin branch  . CARDIOVERSION N/A 12/09/2013   Procedure: CARDIOVERSION;  Surgeon: Thurmon FairMihai Croitoru, MD;  Location: [facility] ENDOSCOPY;  Service: Cardiovascular;  Laterality: N/A;  . CORONARY ARTERY BYPASS GRAFT  02/24/2011   LIMA to LAD, SVG to distal RCA  . ESOPHAGOGASTRODUODENOSCOPY N/A 06/21/2013   Procedure:  ESOPHAGOGASTRODUODENOSCOPY (EGD);  Surgeon: Theda BelfastPatrick D Hung, MD;  [facility];  Service: Endoscopy;  Laterality: N/A;  . GIVENS CAPSULE STUDY N/A 06/21/2013   Procedure: GIVENS CAPSULE STUDY;  Surgeon: Theda BelfastPatrick D Hung, MD;  [facility];  Service: Endoscopy;  Laterality: N/A;  . GIVENS CAPSULE STUDY N/A 03/25/2014   Procedure: GIVENS CAPSULE STUDY;  Surgeon: Theda BelfastPatrick D Hung, MD;  [facility];  Service: Endoscopy;  Laterality: N/A;  . HERNIA REPAIR    . LEFT AND RIGHT HEART CATHETERIZATION WITH CORONARY ANGIOGRAM N/A 02/13/2011   Procedure: LEFT AND RIGHT HEART CATHETERIZATION WITH CORONARY ANGIOGRAM;  Surgeon: Lennette Biharihomas A Kelly, MD;  [facility];  Service: Cardiovascular;  Laterality: N/A;  . LUMBAR LAMINECTOMY/DECOMPRESSION MICRODISCECTOMY Right 04/16/2012   Procedure: DECOMPRESSIVE L4 - L5/ MICRODISCECTOMY ON THE RIGHT 1 LEVEL;  Surgeon: Jacki Conesonald A Gioffre, MD;  Location: [facility] ORS;  Service: Orthopedics;  Laterality: Right;  . PACEMAKER INSERTION  2006   [employer]onic EnRhythm  . PERMANENT PACEMAKER GENERATOR CHANGE N/A 05/10/2013   Procedure: PERMANENT PACEMAKER GENERATOR CHANGE;  Surgeon: Thurmon FairMihai Croitoru, MD;  Location: [facility] CATH LAB;  Service: Cardiovascular;  Laterality: N/A;    Allergies:  Rosuvastatin calcium; Statins; Aspirin; Feraheme [ferumoxytol]; and Penicillins Prior to Admit Meds:   (Not in a hospital admission) Fam HX:    Family History  Problem Relation Age of Onset  . Hypertension Mother   . Kidney disease Brother    Social HX:    Social History   Social History  . Marital status: Married    Spouse name: [name]  . Number of children: 1  . Years of education: 6   Occupational History  . Not on file.   Social History Main Topics  . Smoking status: Former Smoker    Types: Cigarettes    Quit date: 02/11/1957  . Smokeless tobacco: Never Used  . Alcohol use No  . Drug use: No  . Sexual activity: Not on file   Other Topics Concern  . Not on file    Social History Narrative   Patient is married ([name]).   Patient has one child.   Patient does not drink caffeine.   Patient is right-handed.   Patient is retired.   Patient has a 6th grade education.           ROS:  All 11 ROS were addressed and are negative except what is stated in the HPI  Physical Exam: Blood pressure (!) 99/53, pulse 70, temperature 97.4 F (36.3 C), temperature source Oral, resp. rate 19, height 5' 7.5" (1.715 m), weight 159 lb (72.1 kg), SpO2 98 %.    General: Well developed, well nourished, in no acute distress Head: Eyes PERRLA, No  xanthomas.   Normal cephalic and atramatic  Lungs:   Clear bilaterally to auscultation and percussion. Heart:   HRRR S1 S2 Pulses are 2+ & equal.            No carotid bruit. No JVD.  No abdominal bruits. No femoral bruits. Abdomen: Bowel sounds are positive, abdomen soft and non-tender without masses or                  Hernia's noted. Msk:  Back normal, normal gait. Normal strength and tone for age. Extremities:   No clubbing, cyanosis or edema.  DP +1 Neuro: Alert and oriented X 3. Psych:  Good affect, responds appropriately    Labs:   Lab Results  Component Value Date   WBC 4.0 01/27/2016   HGB 7.6 (L) 01/27/2016   HCT 22.7 (L) 01/27/2016   MCV 95.4 01/27/2016   PLT 77 (L) 01/27/2016    Recent Labs Lab 01/27/16 1051  NA 140  K 3.6  CL 101  CO2 26  BUN 68*  CREATININE 5.84*  CALCIUM 9.1  GLUCOSE 117*   No results found for: PTT Lab Results  Component Value Date   INR 1.10 01/11/2016   INR 1.83 (H) 03/26/2014   INR 1.80 (H) 03/25/2014   Lab Results  Component Value Date   CKTOTAL 164 06/28/2006   CKMB 2.1 06/28/2006   TROPONINI 0.05 (HH) 01/12/2016     Lab Results  Component Value Date   CHOL 190 07/08/2012   CHOL  06/29/2006    95        ATP III CLASSIFICATION:  <200     mg/dL   Desirable  161-096  mg/dL   Borderline High  >=045    mg/dL   High   Lab Results  Component Value Date     HDL 28 (L) 07/08/2012   HDL 35 (L) 06/29/2006   Lab Results  Component Value Date   LDLCALC 129 (H) 07/08/2012   LDLCALC  06/29/2006    50        Total Cholesterol/HDL:CHD Risk Coronary Heart Disease Risk Table                     Men   Women  1/2 Average Risk   3.4   3.3   Lab Results  Component Value Date   TRIG 164 (H) 07/08/2012   TRIG 52 06/29/2006   Lab Results  Component Value Date   CHOLHDL 6.8 07/08/2012   CHOLHDL 2.7 06/29/2006   No results found for: LDLDIRECT    Radiology:  Dg Chest Port 1 View  Result Date: 01/27/2016 CLINICAL DATA:  Chest tightness and syncope today. EXAM: PORTABLE CHEST 1 VIEW COMPARISON:  PA and lateral chest 01/11/2016 and 12/22/2013 FINDINGS: The patient is status post CABG with a pacing device in place. There is marked cardiomegaly and interstitial pulmonary edema. No consolidative process, pneumothorax or effusion. IMPRESSION: Cardiomegaly and interstitial pulmonary edema. Electronically Signed   By: Drusilla Kanner M.D.   On: 01/27/2016 11:37    EKG:  Atrial fibrillation with CVR and LBBB  ASSESSMENT/PLAN:   1.  Chest pain with minimally elevated trop- he has continued to have episodes of chest pain since his OV last week.  Trop in ER today is 0.1.  Currently pain free.  Hbg is 7.6 so ? Whether that is driving his CP and minimally elevated trop due to demand ischemia. Nuclear stress test with no ischemia several  weeks ago.  Will hold on Heparin at present secondary to signficant anemia.  Will need workup of low Hbg prior to cath given that he will need to be on DAPT if he gets a stent.  He is currently set up for cath 1/11.  No BB due to soft BP.    2.  Permanent atrial fibrillation rate controlled - no anticoagulated due to history of GI bleed  3.  AS s/p bioprosthetic AVR  4.  ASCAD s/p remote CABG with LIMA to LAD and SVG to RCA.  Continue ASA.    5.  Hyperlipidemia - currently not on statin therapy due to intolerance.  Consider  PCSK 9 inhibitor as outp.   6.  Chronic diastolic CHF - chest xray shows interstitial edema.  Volume management per nephrology  7.  ESRD on HD - per nephrology  8.  SSS s/p PPM  9.  SLE  10.  Hypotension stable on midodrine  11.  Recent Pericarditis - continue Colchicine    Armanda Magic, MD  01/27/2016  12:43 PM

## 2016-01-27 NOTE — ED Triage Notes (Signed)
[facility] and lower extremity weakness. Pt was in the car when family reports he "passed out for several minutes". EMS states he was AO x4 when they arrived.

## 2016-01-27 NOTE — Progress Notes (Signed)
ANTICOAGULATION CONSULT NOTE - Initial Consult  Pharmacy Consult for heparin Indication: chest pain/ACS  Allergies  Allergen Reactions  . Rosuvastatin Calcium Other (See Comments)    myalgias  . Statins Other (See Comments)    myalgias  . Aspirin Nausea Only    Patient states that he can take the enteric coated but not the uncoated  . Feraheme [Ferumoxytol] Rash and Other (See Comments)    Abdominal pain  . Penicillins Hives    Patient Measurements: Height: 5' 7.5" (171.5 cm) Weight: 159 lb (72.1 kg) IBW/kg (Calculated) : 67.25  Vital Signs: Temp: 97.4 F (36.3 C) (01/07 1049) Temp Source: Oral (01/07 1049) BP: 106/66 (01/07 1049) Pulse Rate: 102 (01/07 1049)  Labs:  Recent Labs  01/27/16 1051  HGB 7.6*  HCT 22.7*  PLT 77*  CREATININE 5.84*    Estimated Creatinine Clearance: 8.5 mL/min (by C-G formula based on SCr of 5.84 mg/dL (H)).   Medical History: Past Medical History:  Diagnosis Date  . Acute on chronic diastolic CHF (congestive heart failure), NYHA class 3 (HCC) 12/07/2013  . Anemia   . Arthritis   . Benign prostatic hypertrophy   . Blood dyscrasia    one time had low platlet count  . Cellulitis   . Chronic kidney disease    not on dialysis yet  . Claudication (HCC) 02/19/2012   LE doppler no evidence of arterial insufficiency, lower extremities demonstrate normal values  w/ no evidence of insufficiency  . Constipation   . Coronary artery disease 05/14/2010   stress test - no scintigraphic evidence of inducible myocardial ischemia,; normal study  . Dysrhythmia    atrial fib  . Hypertension   . Lupus nephritis (HCC)   . Pacemaker 09/19/2004   implanted  . Pacemaker   . Pericardial effusion 03/12/2011   echo EF 55-60%  . Restless leg syndrome   . Shortness of breath with exertion  . Shoulder joint pain   . SSS (sick sinus syndrome) (HCC) 08/27/2011   echo EF >55%, severe LAE, moderate RAE, tissue AVR w/ gradients 35 and 17mmHg     Medications:  See PTA medication list  Assessment: 83 y/o male with ESRD on HD admitted with syncope who admits to having chest pressure associated with nausea. Recent admission 12/22 - 12/24 for chest pain but did not want to proceed with cath; also diagnosed with pericarditis and treated with colchicine. He has decided to proceed with cath this week. Pharmacy consulted to begin IV heparin. FOB + today. Hgb low at 7.6 and platelets low at 77 (has hx of low platelets). Spoke with Dr. Clarene DukeLittle and we agreed to start heparin with lower goal rate and no bolus.  Goal of Therapy:  Heparin level 0.3-0.5 units/ml due to low Hgb Monitor platelets by anticoagulation protocol: Yes   Plan:  - Begin heparin drip at 900 units/hr with no bolus - 8 hr heparin level - Daily heparin level and CBC - Monitor for s/sx of bleeding   Loura BackJennifer St. Anthony, PharmD, BCPS Clinical Pharmacist Phone for today [phone] Main pharmacy - [phone] 01/27/2016 11:48 AM

## 2016-01-27 NOTE — H&P (Signed)
[facility] History and Physical  ROGERIO BOUTELLE ZOX:[id number] DOB: 01-May-1926 DOA: 01/27/2016  Referring physician:  PCP: Delorse Lek, MD   Chief Complaint: "He passed out in the car." - wife  HPI: Nathaniel Steele is a 83 y.o. male  with past history significant for congestive heart failure, chronic kidney disease, coronary disease, lupus, pacemaker, restless leg who presents to the emergency room chief complaint of syncope. Patient states that since his last hospital admission his legs have been weak. Patient states he can only walk short distances. Patient states yesterday he had some chest discomfort and took a nitroglycerin with relief. Patient states the same happened twice today and with one nitroglycerin this chest pain was resolved. The second episode happened just prior to the patient getting ready to leave the house. On the way to the car his legs became weaker than normal patient made it inside the car and then became unresponsive having passed out for a few minutes. EMS came and patient had already regained consciousness. Patient was brought to the hospital.  Patient denies fever, chills, nausea, vomiting, shortness of breath, dizziness, weakness in upper extremities.  History gathered from patient and wife.  ED course: EDP started heparin for ACS. Cardiology consulted who advised stopping heparin due to the patient's history of GI bleed. Medical management at this time advised. Hospitalist was consulted for admission. Patient did arrive on 1 L of oxygen due to some mild hypoxia.  Review of Systems:  As per HPI otherwise 10 point review of systems negative.    Past Medical History:  Diagnosis Date  . Acute on chronic diastolic CHF (congestive heart failure), NYHA class 3 (HCC) 12/07/2013  . Anemia   . Arthritis   . Benign prostatic hypertrophy   . Blood dyscrasia    one time had low platlet count  . Cellulitis   . Chronic kidney disease    not on dialysis yet  .  Claudication (HCC) 02/19/2012   LE doppler no evidence of arterial insufficiency, lower extremities demonstrate normal values  w/ no evidence of insufficiency  . Constipation   . Coronary artery disease 05/14/2010   stress test - no scintigraphic evidence of inducible myocardial ischemia,; normal study  . Dysrhythmia    atrial fib  . Hypertension   . Lupus nephritis (HCC)   . Pacemaker 09/19/2004   implanted  . Pacemaker   . Pericardial effusion 03/12/2011   echo EF 55-60%  . Restless leg syndrome   . Shortness of breath with exertion  . Shoulder joint pain   . SSS (sick sinus syndrome) (HCC) 08/27/2011   echo EF >55%, severe LAE, moderate RAE, tissue AVR w/ gradients 35 and   Past Surgical History:  Procedure Laterality Date  . AORTIC VALVE REPLACEMENT  02/24/2011   pericardial tissue valve [facility] Ease  . APPENDECTOMY    . AV FISTULA PLACEMENT  02/24/2011  . CARDIAC CATHETERIZATION  02/13/2011   mod/severe aortic valve stenosis w peak to peak gradient 25-32 mmHg,  70% stenosis LAD, 30-40%proximal followed by 90% sstenosis in RCA prior to anterior RV margin branch  . CARDIOVERSION N/A 12/09/2013   Procedure: CARDIOVERSION;  Surgeon: Thurmon Fair, MD;  Location: [facility] ENDOSCOPY;  Service: Cardiovascular;  Laterality: N/A;  . CORONARY ARTERY BYPASS GRAFT  02/24/2011   LIMA to LAD, SVG to distal RCA  . ESOPHAGOGASTRODUODENOSCOPY N/A 06/21/2013   Procedure: ESOPHAGOGASTRODUODENOSCOPY (EGD);  Surgeon: Theda Belfast, MD;  Location: [facility] ENDOSCOPY;  Service: Endoscopy;  Laterality: N/A;  . GIVENS CAPSULE STUDY N/A 06/21/2013   Procedure: GIVENS CAPSULE STUDY;  Surgeon: Theda Belfast, MD;  Location: [facility] ENDOSCOPY;  Service: Endoscopy;  Laterality: N/A;  . GIVENS CAPSULE STUDY N/A 03/25/2014   Procedure: GIVENS CAPSULE STUDY;  Surgeon: Theda Belfast, MD;  Location: [facility] ENDOSCOPY;  Service: Endoscopy;  Laterality: N/A;  . HERNIA REPAIR    . LEFT AND RIGHT HEART CATHETERIZATION WITH CORONARY  ANGIOGRAM N/A 02/13/2011   Procedure: LEFT AND RIGHT HEART CATHETERIZATION WITH CORONARY ANGIOGRAM;  Surgeon: Lennette Bihari, MD;  Location: [facility] CATH LAB;  Service: Cardiovascular;  Laterality: N/A;  . LUMBAR LAMINECTOMY/DECOMPRESSION MICRODISCECTOMY Right 04/16/2012   Procedure: DECOMPRESSIVE L4 - L5/ MICRODISCECTOMY ON THE RIGHT 1 LEVEL;  Surgeon: Jacki Cones, MD;  Location: [facility] ORS;  Service: Orthopedics;  Laterality: Right;  . PACEMAKER INSERTION  2006   [employer]onic EnRhythm  . PERMANENT PACEMAKER GENERATOR CHANGE N/A 05/10/2013   Procedure: PERMANENT PACEMAKER GENERATOR CHANGE;  Surgeon: Thurmon Fair, MD;  Location: [facility] CATH LAB;  Service: Cardiovascular;  Laterality: N/A;   Social History:  reports that he quit smoking about 58 years ago. His smoking use included Cigarettes. He has never used smokeless tobacco. He reports that he does not drink alcohol or use drugs.  Allergies  Allergen Reactions  . Rosuvastatin Calcium Other (See Comments)    myalgias  . Statins Other (See Comments)    myalgias  . Aspirin Nausea Only    Patient states that he can take the enteric coated but not the uncoated  . Feraheme [Ferumoxytol] Rash and Other (See Comments)    Abdominal pain  . Penicillins Hives    Family History  Problem Relation Age of Onset  . Hypertension Mother   . Kidney disease Brother      Prior to Admission medications   Medication Sig Start Date End Date Taking? Authorizing Provider  aspirin EC 81 MG EC tablet Take 1 tablet (81 mg total) by mouth daily. 01/14/16  Yes Leone Brand, NP  Cyanocobalamin (VITAMIN B 12 PO) Take 1 tablet by mouth daily.   Yes Historical Provider, MD  cycloSPORINE (RESTASIS) 0.05 % ophthalmic emulsion Place 1 drop into both eyes 2 (two) times daily.    Yes Historical Provider, MD  fish oil-omega-3 fatty acids 1000 MG capsule Take 1 g by mouth daily.    Yes Historical Provider, MD  gabapentin (NEURONTIN) 300 MG capsule Take 300 mg by mouth every  evening.   Yes Historical Provider, MD  lanthanum (FOSRENOL) 1000 MG chewable tablet Chew 1,000 mg by mouth 3 (three) times daily with meals.   Yes Historical Provider, MD  midodrine (PROAMATINE) 2.5 MG tablet Take 2.5 mg by mouth 2 (two) times daily with a meal. SECOND DOSE 6 HOURS LATER   Yes Historical Provider, MD  Multiple Vitamins-Minerals (OCUVITE PRESERVISION PO) Take 1 tablet by mouth daily.   Yes Historical Provider, MD  nitroGLYCERIN (NITROSTAT) 0.4 MG SL tablet Place 1 tablet (0.4 mg total) under the tongue every 5 (five) minutes x 3 doses as needed for chest pain. 01/22/16  Yes Rhonda G Barrett, PA-C  pantoprazole (PROTONIX) 40 MG tablet Take 1 tablet (40 mg total) by mouth 2 (two) times daily. 03/26/14  Yes Belkys A Regalado, MD  rOPINIRole (REQUIP) 2 MG tablet Take 6 mg by mouth at bedtime.    Yes Historical Provider, MD  colchicine 0.6 MG tablet Take 1 tablet (0.6 mg total) by mouth 2 (two) times daily.  Patient not taking: Reported on 01/27/2016 01/22/16   Joline Salthonda G Barrett, PA-C  nitroGLYCERIN (NITRODUR - DOSED IN MG/24 HR) 0.2 mg/hr patch Place 1 patch (0.2 mg total) onto the skin daily. Patient not taking: Reported on 01/27/2016 01/22/16   Darrol Jumphonda G Barrett, PA-C   Physical Exam: Vitals:   01/27/16 1100 01/27/16 1130 01/27/16 1200 01/27/16 1230  BP: 113/59 107/59 (!) 99/53 121/69  Pulse: 72 72 70 71  Resp: 16 20 19 18   Temp:      TempSrc:      SpO2: 90% 96% 98% 98%  Weight:      Height:        Wt Readings from Last 3 Encounters:  01/27/16 72.1 kg (159 lb)  01/22/16 72.1 kg (159 lb)  01/13/16 67.8 kg (149 lb 7.6 oz)    General:  Appears calm and comfortable; mildly ill-appearing, alert oriented 3, pale Eyes:  PERRL, EOMI, normal lids, iris ENT:  grossly normal hearing, lips & tongue Neck:  no LAD, masses or thyromegaly Cardiovascular:  RRR, no m/r/g. No LE edema. Good thrill on left AV fistula. Respiratory:  CTA bilaterally, no w/r/r. Normal respiratory effort. Abdomen:   soft, ntnd Skin:  no rash or induration seen on limited exam Musculoskeletal:  grossly normal tone BUE/BLE Psychiatric:  grossly normal mood and affect, speech fluent and appropriate Neurologic:  CN 2-12 grossly intact, moves all extremities in coordinated fashion.          Labs on Admission:  Basic Metabolic Panel:  Recent Labs Lab 01/27/16 1051  NA 140  K 3.6  CL 101  CO2 26  GLUCOSE 117*  BUN 68*  CREATININE 5.84*  CALCIUM 9.1   Liver Function Tests: No results for input(s): AST, ALT, ALKPHOS, BILITOT, PROT, ALBUMIN in the last 168 hours. No results for input(s): LIPASE, AMYLASE in the last 168 hours. No results for input(s): AMMONIA in the last 168 hours. CBC:  Recent Labs Lab 01/27/16 1051  WBC 4.0  HGB 7.6*  HCT 22.7*  MCV 95.4  PLT 77*   Cardiac Enzymes: No results for input(s): CKTOTAL, CKMB, CKMBINDEX, TROPONINI in the last 168 hours.  BNP (last 3 results) No results for input(s): BNP in the last 8760 hours.  ProBNP (last 3 results) No results for input(s): PROBNP in the last 8760 hours.   Serum creatinine: 5.84 mg/dL High 16/10/9599/07/18 [id number] Estimated creatinine clearance: 8.5 mL/min  CBG:  Recent Labs Lab 01/27/16 1204  GLUCAP 115*    Radiological Exams on Admission: Dg Chest Port 1 View  Result Date: 01/27/2016 CLINICAL DATA:  Chest tightness and syncope today. EXAM: PORTABLE CHEST 1 VIEW COMPARISON:  PA and lateral chest 01/11/2016 and 12/22/2013 FINDINGS: The patient is status post CABG with a pacing device in place. There is marked cardiomegaly and interstitial pulmonary edema. No consolidative process, pneumothorax or effusion. IMPRESSION: Cardiomegaly and interstitial pulmonary edema. Electronically Signed   By: Drusilla Kannerhomas  Dalessio M.D.   On: 01/27/2016 11:37    I independently reviewed the chest x-ray and agree with the impression of the radiologist. Chest x-ray shows mild extension pulmonary edema.   EKG: Independently reviewed. EKG  ventricular rate 71, QRS 214, QTc 571, ventricular paced rhythm, occasional P waves what conduction of QRS-no STEMI; when compared to previous EKG no acute changes noted  Assessment/Plan Active Problems:   End-stage renal disease (ESRD) (HCC)   Permanent atrial fibrillation (HCC)   Acute on chronic diastolic CHF (congestive heart failure), NYHA class 3 (HCC)  Anemia   Hypotension (arterial)   Chest pain with moderate risk of acute coronary syndrome   Syncope, symptomatic Trend trop Tele EKG prn CP EKG in AM Check Mg & Phos Patient has known history of anemia mostly due to renal issues but also has known GI bleed that he is to get outpatient follow-up for could benefit from 1 unit of blood so discussed with renal who agrees We'll continue patient's aspirin, will not escalate dose due to history of bleed ECHO complete  Leg weakness Duration >4 weeks Trying to get a scooter due to easy fatigue PT eval for mobilization  History of congestive heart failure Chest x-ray shows mild interstitial edema Patient with normal work of breathing on exam lungs are clear Patient was only on 1 L oxygen so turned off. Speaking in rapid long sentences without positive maintaining saturation and 100% Will not ask for an expedited nephrology consult for diuresis at this time are given a nitroglycerin Continuous pulse ox  CP, chronic angina, controlled Prn ntg sl  RLS, chronic Continue Requip  Chronic pain, stable continue Neurontin  Low blood pressure Continue Midrin  GERD Cont OP protonix 40mg  mg qd  Glaucoma Continue rest stasis  End-stage renal disease continue Fosrenol Nephrology consult  Code Status: FULL  DVT Prophylaxis: heparin Family Communication: wife at bedside Disposition Plan: Pending Improvement  Status: tele obs  Haydee Salter, MD Family Medicine [facility] [email or web address] Password [email or web address]

## 2016-01-27 NOTE — ED Provider Notes (Signed)
[facility]-EMERGENCY DEPT Provider Note   CSN: [id number] Arrival date & time: 01/27/16 1036     History    Chief Complaint  Patient presents with  . Weakness  . Loss of Consciousness     HPI Nathaniel Steele is a 83 y.o. male.  83yo M w/ extensive PMH including CHF, CAD, pacemaker, ESRD on HD Who presents with syncope. Patient was walking to his car this morning and family states that he collapsed to the ground with a brief syncopal episode. He did not strike his head or sustain any injuries. They state that he was out for a brief period of time but was awake and alert by EMS. Patient currently endorses mild, central chest pressure associated with nausea. He has had chest pressure since earlier this morning. He did take a nitroglycerin earlier this morning. He was discharged from the hospital recently on 12/24 after an admission for chest pain. At that time, he did not want to undergo heart catheterization but has since been scheduled for a catheterization later this week. He denies any fevers or cough/cold symptoms. He denies any shortness of breath.  Past Medical History:  Diagnosis Date  . Acute on chronic diastolic CHF (congestive heart failure), NYHA class 3 (HCC) 12/07/2013  . Anemia   . Arthritis   . Benign prostatic hypertrophy   . Blood dyscrasia    one time had low platlet count  . Cellulitis   . Claudication (HCC) 02/19/2012   LE doppler no evidence of arterial insufficiency, lower extremities demonstrate normal values  w/ no evidence of insufficiency  . Constipation   . Coronary artery disease 05/14/2010   stress test - no scintigraphic evidence of inducible myocardial ischemia,; normal study  . Dysrhythmia    atrial fib  . ESRD (end stage renal disease) (HCC)    not on dialysis yet  . H/O: HTN (hypertension)   . Lupus nephritis (HCC)   . Pacemaker 09/19/2004   implanted  . Pacemaker   . Pericardial effusion 03/12/2011   echo EF 55-60%  . Restless leg syndrome   .  Shortness of breath with exertion  . Shoulder joint pain   . SSS (sick sinus syndrome) (HCC) 08/27/2011   echo EF >55%, severe LAE, moderate RAE, tissue AVR w/ gradients 35 and     Patient Active Problem List   Diagnosis Date Noted  . Chest pain with moderate risk of acute coronary syndrome 01/11/2016  . Hypotension (arterial) 10/16/2015  . Chronic diastolic heart failure (HCC) 01/09/2015  . ESRD (end stage renal disease) (HCC) 01/09/2015  . History of aortic valve replacement with bioprosthetic valve 01/09/2015  . Absolute anemia   . Chest pain most likely due to pericarditis.   03/24/2014  . Anemia 03/17/2014  . Severe anemia 03/17/2014  . Acute renal failure syndrome (HCC)   . CHF exacerbation (HCC)   . ARF (acute renal failure) (HCC) 12/23/2013  . Atrial flutter (HCC) 12/09/2013  . Acute on chronic diastolic CHF (congestive heart failure), NYHA class 3 (HCC) 12/07/2013  . Symptomatic anemia 06/21/2013  . Guaiac positive stools 06/21/2013  . GI bleed 06/21/2013  . SSS (sick sinus syndrome) (HCC) 05/10/2013  . Symptomatic bradycardia 05/10/2013  . Pacemaker battery depletion 05/10/2013  . Hematoma of right lower extremity 05/08/2013  . Warfarin-induced coagulopathy (HCC) 05/08/2013  . Hematoma of lower limb 05/08/2013  . Dyspnea, began 04/19/13 04/25/2013  . Epigastric discomfort/chest discomfort 04/25/2013  . RLS (restless legs syndrome) 12/01/2012  .  Unspecified hereditary and idiopathic peripheral neuropathy 12/01/2012  . Tobacco abuse 11/30/2012  . Spinal stenosis, lumbar region, with neurogenic claudication 04/16/2012  . Permanent atrial fibrillation (HCC) 02/26/2011  . Pacemaker, MDT implanted 08/06, generator change April 2015 02/26/2011  . Acute post-hemorrhagic anemia 02/26/2011  . Respiratory failure, hypoxia post op, on bipap currently 02/26/2011  . S/P CABG x 2: (LIMA-LAD  SVG - RCA) 02/25/2011  . End-stage renal disease (ESRD) (HCC) 02/12/2011    Class:  Chronic  . SLE (systemic lupus erythematosus) (HCC) 02/12/2011    Past Surgical History:  Procedure Laterality Date  . AORTIC VALVE REPLACEMENT  02/24/2011   pericardial tissue valve [facility] Ease  . APPENDECTOMY    . AV FISTULA PLACEMENT  02/24/2011  . CARDIAC CATHETERIZATION  02/13/2011   mod/severe aortic valve stenosis w peak to peak gradient 25-32 mmHg,  70% stenosis LAD, 30-40%proximal followed by 90% sstenosis in RCA prior to anterior RV margin branch  . CARDIOVERSION N/A 12/09/2013   Procedure: CARDIOVERSION;  Surgeon: Thurmon Fair, MD;  Location: [facility] ENDOSCOPY;  Service: Cardiovascular;  Laterality: N/A;  . CORONARY ARTERY BYPASS GRAFT  02/24/2011   LIMA to LAD, SVG to distal RCA  . ESOPHAGOGASTRODUODENOSCOPY N/A 06/21/2013   Procedure: ESOPHAGOGASTRODUODENOSCOPY (EGD);  Surgeon: Theda Belfast, MD;  Location: [facility] ENDOSCOPY;  Service: Endoscopy;  Laterality: N/A;  . GIVENS CAPSULE STUDY N/A 06/21/2013   Procedure: GIVENS CAPSULE STUDY;  Surgeon: Theda Belfast, MD;  Location: [facility] ENDOSCOPY;  Service: Endoscopy;  Laterality: N/A;  . GIVENS CAPSULE STUDY N/A 03/25/2014   Procedure: GIVENS CAPSULE STUDY;  Surgeon: Theda Belfast, MD;  Location: [facility] ENDOSCOPY;  Service: Endoscopy;  Laterality: N/A;  . HERNIA REPAIR    . LEFT AND RIGHT HEART CATHETERIZATION WITH CORONARY ANGIOGRAM N/A 02/13/2011   Procedure: LEFT AND RIGHT HEART CATHETERIZATION WITH CORONARY ANGIOGRAM;  Surgeon: Lennette Bihari, MD;  Location: [facility] CATH LAB;  Service: Cardiovascular;  Laterality: N/A;  . LUMBAR LAMINECTOMY/DECOMPRESSION MICRODISCECTOMY Right 04/16/2012   Procedure: DECOMPRESSIVE L4 - L5/ MICRODISCECTOMY ON THE RIGHT 1 LEVEL;  Surgeon: Jacki Cones, MD;  Location: [facility] ORS;  Service: Orthopedics;  Laterality: Right;  . PACEMAKER INSERTION  2006   Medtronic EnRhythm  . PERMANENT PACEMAKER GENERATOR CHANGE N/A 05/10/2013   Procedure: PERMANENT PACEMAKER GENERATOR CHANGE;  Surgeon: Thurmon Fair, MD;  Location: [facility]  CATH LAB;  Service: Cardiovascular;  Laterality: N/A;        Home Medications    Prior to Admission medications   Medication Sig Start Date End Date Taking? Authorizing Provider  aspirin EC 81 MG EC tablet Take 1 tablet (81 mg total) by mouth daily. 01/14/16  Yes Leone Brand, NP  Cyanocobalamin (VITAMIN B 12 PO) Take 1 tablet by mouth daily.   Yes Historical Provider, MD  cycloSPORINE (RESTASIS) 0.05 % ophthalmic emulsion Place 1 drop into both eyes 2 (two) times daily.    Yes Historical Provider, MD  fish oil-omega-3 fatty acids 1000 MG capsule Take 1 g by mouth daily.    Yes Historical Provider, MD  gabapentin (NEURONTIN) 300 MG capsule Take 300 mg by mouth every evening.   Yes Historical Provider, MD  lanthanum (FOSRENOL) 1000 MG chewable tablet Chew 1,000 mg by mouth 3 (three) times daily with meals.   Yes Historical Provider, MD  midodrine (PROAMATINE) 2.5 MG tablet Take 2.5 mg by mouth 2 (two) times daily with a meal. SECOND DOSE 6 HOURS LATER   Yes Historical Provider, MD  Multiple  Vitamins-Minerals (OCUVITE PRESERVISION PO) Take 1 tablet by mouth daily.   Yes Historical Provider, MD  nitroGLYCERIN (NITROSTAT) 0.4 MG SL tablet Place 1 tablet (0.4 mg total) under the tongue every 5 (five) minutes x 3 doses as needed for chest pain. 01/22/16  Yes Rhonda G Barrett, PA-C  pantoprazole (PROTONIX) 40 MG tablet Take 1 tablet (40 mg total) by mouth 2 (two) times daily. 03/26/14  Yes Belkys A Regalado, MD  rOPINIRole (REQUIP) 2 MG tablet Take 6 mg by mouth at bedtime.    Yes Historical Provider, MD  colchicine 0.6 MG tablet Take 1 tablet (0.6 mg total) by mouth 2 (two) times daily. Patient not taking: Reported on 01/27/2016 01/22/16   Joline Salthonda G Barrett, PA-C  nitroGLYCERIN (NITRODUR - DOSED IN MG/24 HR) 0.2 mg/hr patch Place 1 patch (0.2 mg total) onto the skin daily. Patient not taking: Reported on 01/27/2016 01/22/16   Joline Salthonda G Barrett, PA-C      Family History  Problem Relation Age of Onset  .  Hypertension Mother   . Kidney disease Brother      Social History  Substance Use Topics  . Smoking status: Former Smoker    Types: Cigarettes    Quit date: 02/11/1957  . Smokeless tobacco: Never Used  . Alcohol use No     Allergies     Rosuvastatin calcium; Statins; Aspirin; Feraheme [ferumoxytol]; and Penicillins    Review of Systems  10 Systems reviewed and are negative for acute change except as noted in the HPI.   Physical Exam Updated Vital Signs BP 121/69   Pulse 71   Temp 97.4 F (36.3 C) (Oral)   Resp 18   Ht 5' 7.5" (1.715 m)   Wt 159 lb (72.1 kg)   SpO2 98%   BMI 24.54 kg/m   Physical Exam  Constitutional: He is oriented to person, place, and time. He appears well-developed. No distress.  Thin, frail elderly man awake and alert  HENT:  Head: Normocephalic and atraumatic.  Moist mucous membranes  Eyes: Conjunctivae are normal. Pupils are equal, round, and reactive to light.  Neck: Neck supple.  Cardiovascular: Normal rate and regular rhythm.   Murmur heard. Pulmonary/Chest: Effort normal and breath sounds normal.  Abdominal: Soft. Bowel sounds are normal. He exhibits no distension. There is no tenderness.  Musculoskeletal: He exhibits no edema.  Neurological: He is alert and oriented to person, place, and time.  Fluent speech  Skin: Skin is warm and dry. There is pallor.  Psychiatric: He has a normal mood and affect.  Nursing note and vitals reviewed.     ED Treatments / Results  Labs (all labs ordered are listed, but only abnormal results are displayed) Labs Reviewed  BASIC METABOLIC PANEL - Abnormal; Notable for the following:       Result Value   Glucose, Bld 117 (*)    BUN 68 (*)    Creatinine, Ser 5.84 (*)    GFR calc non Af Amer 8 (*)    GFR calc Af Amer 9 (*)    All other components within normal limits  CBC - Abnormal; Notable for the following:    RBC 2.38 (*)    Hemoglobin 7.6 (*)    HCT 22.7 (*)    RDW 15.8 (*)     Platelets 77 (*)    All other components within normal limits  CBG MONITORING, ED - Abnormal; Notable for the following:    Glucose-Capillary 115 (*)    All  other components within normal limits  POC OCCULT BLOOD, ED - Abnormal; Notable for the following:    Fecal Occult Bld POSITIVE (*)    All other components within normal limits  I-STAT TROPOININ, ED - Abnormal; Notable for the following:    Troponin i, poc 0.10 (*)    All other components within normal limits  URINALYSIS, ROUTINE W REFLEX MICROSCOPIC  HEPARIN LEVEL (UNFRACTIONATED)  TROPONIN I  TROPONIN I  TROPONIN I     EKG  EKG Interpretation  Date/Time:  Sunday January 27 2016 10:50:28 EST Ventricular Rate:  85 PR Interval:    QRS Duration: 105 QT Interval:  423 QTC Calculation: 546 R Axis:   -15 Text Interpretation:  Sinus tachycardia Borderline left axis deviation Repol abnrm, severe global ischemia (LM/MVD) Prolonged QT interval deep T wave inversions diffusely concerning for ischemia Confirmed by Mitzi Lilja MD, Milledge Gerding [phone]) on 01/27/2016 10:56:41 AM         Radiology Dg Chest Port 1 View  Result Date: 01/27/2016 CLINICAL DATA:  Chest tightness and syncope today. EXAM: PORTABLE CHEST 1 VIEW COMPARISON:  PA and lateral chest 01/11/2016 and 12/22/2013 FINDINGS: The patient is status post CABG with a pacing device in place. There is marked cardiomegaly and interstitial pulmonary edema. No consolidative process, pneumothorax or effusion. IMPRESSION: Cardiomegaly and interstitial pulmonary edema. Electronically Signed   By: Drusilla Kanner M.D.   On: 01/27/2016 11:37    Procedures Procedures (including critical care time) Procedures  Medications Ordered in ED  Medications - No data to display   Initial Impression / Assessment and Plan / ED Course  I have reviewed the triage vital signs and the nursing notes.  Pertinent labs & imaging results that were available during my care of the patient were reviewed by me  and considered in my medical decision making (see chart for details).  Clinical Course    Pt w/ h/o CAD and ESRD p/w Syncope episode witnessed by family at home. He has also had chest tightness this morning. On arrival by EMS, he was awake and alert. He complained of mild chest tightness, aspirin given by EMS and he had taken nitroglycerin at home. His EKG was concerning with diffuse T-wave inversion without significant ST elevation. I contacted the STEMI doctor, Dr. Clifton James, given his cardiac history and concern for current chest pain. He recommended not activating the cath lab but contacting general cardiology for evaluation. Obtained above lab work which was notable for troponin 0.1, which may be related to patient's known kidney failure. Potassium normal, hemoglobin 7.6 which is slightly lower from baseline anemia around 8. His Hemoccult was positive but he had no obvious blood on exam. Discussed initially over the phone with general cardiology, Dr. Ladona Ridgel, who recommended initiating a heparin drip. Chest x-ray shows mild interstitial pulmonary edema, patient is stable on 1 L [place].   Patient later evaluated by cardiology, Dr. Mayford Knife. She recommended discontinuing heparin drip and admitting to medicine. Discussed admission with Dr. Melynda Ripple, [facility], I appreciate his assistance. Pt admitted for further work up.   Final Clinical Impressions(s) / ED Diagnoses   Final diagnoses:  Syncope and collapse  Chest pain, unspecified type  Anemia, unspecified type     New Prescriptions   No medications on file       Laurence Spates, MD 01/27/16 1517

## 2016-01-28 ENCOUNTER — Other Ambulatory Visit (HOSPITAL_COMMUNITY): Payer: Medicare Other

## 2016-01-28 DIAGNOSIS — R55 Syncope and collapse: Secondary | ICD-10-CM

## 2016-01-28 LAB — URINALYSIS, ROUTINE W REFLEX MICROSCOPIC
Bilirubin Urine: NEGATIVE
Glucose, UA: NEGATIVE mg/dL
Hgb urine dipstick: NEGATIVE
Ketones, ur: NEGATIVE mg/dL
Nitrite: NEGATIVE
PH: 7 (ref 5.0–8.0)
Protein, ur: 100 mg/dL — AB
SPECIFIC GRAVITY, URINE: 1.013 (ref 1.005–1.030)

## 2016-01-28 LAB — TROPONIN I: Troponin I: 0.19 ng/mL (ref <0.03)

## 2016-01-28 LAB — BASIC METABOLIC PANEL
Anion gap: 16 — ABNORMAL HIGH (ref 5–15)
BUN: 79 mg/dL — ABNORMAL HIGH (ref 6–20)
CHLORIDE: 98 mmol/L — AB (ref 101–111)
CO2: 25 mmol/L (ref 22–32)
CREATININE: 6.29 mg/dL — AB (ref 0.61–1.24)
Calcium: 8.8 mg/dL — ABNORMAL LOW (ref 8.9–10.3)
GFR, EST AFRICAN AMERICAN: 8 mL/min — AB (ref >60)
GFR, EST NON AFRICAN AMERICAN: 7 mL/min — AB (ref >60)
Glucose, Bld: 98 mg/dL (ref 65–99)
Potassium: 4.1 mmol/L (ref 3.5–5.1)
SODIUM: 139 mmol/L (ref 135–145)

## 2016-01-28 LAB — CBC
HCT: 22.8 % — ABNORMAL LOW (ref 39.0–52.0)
Hemoglobin: 7.6 g/dL — ABNORMAL LOW (ref 13.0–17.0)
MCH: 31.4 pg (ref 26.0–34.0)
MCHC: 33.3 g/dL (ref 30.0–36.0)
MCV: 94.2 fL (ref 78.0–100.0)
Platelets: 73 10*3/uL — ABNORMAL LOW (ref 150–400)
RBC: 2.42 MIL/uL — AB (ref 4.22–5.81)
RDW: 16.1 % — ABNORMAL HIGH (ref 11.5–15.5)
WBC: 4.3 10*3/uL (ref 4.0–10.5)

## 2016-01-28 LAB — PREPARE RBC (CROSSMATCH)

## 2016-01-28 MED ORDER — SODIUM CHLORIDE 0.9 % IV SOLN
Freq: Once | INTRAVENOUS | Status: AC
  Administered 2016-01-28: 10:00:00 via INTRAVENOUS

## 2016-01-28 NOTE — Progress Notes (Signed)
Patient Name: Nathaniel Steele Date of Encounter: 01/28/2016  Primary Cardiologist: Dr. [facility] Problem List     Active Problems:   End-stage renal disease (ESRD) (HCC)   Permanent atrial fibrillation (HCC)   Acute on chronic diastolic CHF (congestive heart failure), NYHA class 3 (HCC)   Anemia   Chest pain with moderate risk of acute coronary syndrome   Syncope     Subjective   Says his chest feels tight, denies SOB.   Inpatient Medications    Scheduled Meds: . aspirin  81 mg Oral Daily  . cycloSPORINE  1 drop Both Eyes BID  . gabapentin  300 mg Oral QPM  . midodrine  2.5 mg Oral BID WC  . pantoprazole  40 mg Oral BID  . rOPINIRole  6 mg Oral QHS  . sodium chloride flush  3 mL Intravenous Q12H   Continuous Infusions:  PRN Meds: acetaminophen **OR** acetaminophen, nitroGLYCERIN, traZODone   Vital Signs    Vitals:   01/27/16 1800 01/27/16 2049 01/27/16 2318 01/28/16 0506  BP: 101/62 (!) 102/49 (!) 97/51 (!) 99/50  Pulse: 71 73 71 70  Resp: 16 18 18 18   Temp: 97.8 F (36.6 C) 98.3 F (36.8 C) 98 F (36.7 C) 98.6 F (37 C)  TempSrc: Oral Oral Oral Oral  SpO2: 97% 97% 98% 93%  Weight:    155 lb 6.4 oz (70.5 kg)  Height:        Intake/Output Summary (Last 24 hours) at 01/28/16 0943 Last data filed at 01/28/16 0856  Gross per 24 hour  Intake             1055 ml  Output              325 ml  Net              730 ml   Filed Weights   01/27/16 1044 01/27/16 1547 01/28/16 0506  Weight: 159 lb (72.1 kg) 153 lb 14.4 oz (69.8 kg) 155 lb 6.4 oz (70.5 kg)    Physical Exam   GEN: Elderly male in no acute distress.  HEENT: Grossly normal.  Neck: Supple, no JVD, carotid bruits, or masses. Cardiac: RRR, 2/6 systolic  Murmur. No rubs, or gallops. No clubbing, cyanosis, edema.  Radials/DP/PT 2+ and equal bilaterally.  Respiratory:  Respirations regular and unlabored, clear to auscultation bilaterally. GI: Soft, nontender, nondistended, BS + x 4. MS: no  deformity or atrophy. Skin: warm and dry, no rash. Neuro:  Strength and sensation are intact. Psych: AAOx3.  Normal affect.  Labs    CBC  Recent Labs  01/27/16 1051 01/27/16 2153 01/28/16 0029  WBC 4.0  --  4.3  HGB 7.6* 7.8* 7.6*  HCT 22.7* 23.4* 22.8*  MCV 95.4  --  94.2  PLT 77*  --  73*   Basic Metabolic Panel  Recent Labs  01/27/16 1051 01/27/16 1411 01/28/16 0029  NA 140  --  139  K 3.6  --  4.1  CL 101  --  98*  CO2 26  --  25  GLUCOSE 117*  --  98  BUN 68*  --  79*  CREATININE 5.84*  --  6.29*  CALCIUM 9.1  --  8.8*  MG  --  2.3  --   PHOS  --  7.9*  --    Cardiac Enzymes  Recent Labs  01/27/16 1358 01/27/16 1602 01/28/16 0029  TROPONINI 0.12* 0.14* 0.19*    Telemetry  V paced- Personally Reviewed  ECG    V paced, T wave inversion in anterolateral leads - Personally Reviewed  Radiology    Dg Chest Port 1 View  Result Date: 01/27/2016 CLINICAL DATA:  Chest tightness and syncope today. EXAM: PORTABLE CHEST 1 VIEW COMPARISON:  PA and lateral chest 01/11/2016 and 12/22/2013 FINDINGS: The patient is status post CABG with a pacing device in place. There is marked cardiomegaly and interstitial pulmonary edema. No consolidative process, pneumothorax or effusion. IMPRESSION: Cardiomegaly and interstitial pulmonary edema. Electronically Signed   By: Drusilla Kannerhomas  Dalessio M.D.   On: 01/27/2016 11:37      Patient Profile     Mr. 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 He presented with syncope and chest discomfort.    Assessment & Plan   1. NSTEMI: Presents with syncope, and elevated troponin. He was admitted from 01/11/16-01/13/16 with chest pain and had nuclear study, no ischemia on nuc. EF was read  at 15% but Echo confirmed normal EF. He had an episode of NSVT that admission, for which his beta blocker was increased. It was felt his pain was pericarditis for which colchicine was given. He declined heart cath last admission but saw Theodore DemarkRhonda Barrett in our office and agreed to cath which was scheduled for 01/31/16.   He needs heart cath this admission, but FOBT + . Will need GI consult to evaluate source of bleed as he will need DAPT if he gets a stent.   2. Permanent Atrial fib: rate controlled, no anticoagulation with history of GI bleed.   3.  AS s/p bioprosthetic AVR  4.  ASCAD s/p remote CABG with LIMA to LAD and SVG to RCA.  Continue ASA.    5.  Hyperlipidemia - currently not on statin therapy due to intolerance.  Consider PCSK 9 inhibitor as outp.   6.  Chronic diastolic CHF - chest xray shows interstitial edema.  Volume management per nephrology  7.  ESRD on HD - per nephrology  8.  SSS s/p PPM   Signed, Little IshikawaErin E Smith, NP  01/28/2016, 9:43 AM   I have personally seen and examined this patient with Suzzette RighterErin Smith, NP. I agree with the assessment and plan as outlined above. He is known to have CAD with prior CABG. No admitted with syncope and chest pain. Troponin mildly elevated in pt with ESRD on HD. This may represent ACS. Cardiac cath is indicated but is being delayed due to anemia and heme positive stool. He has not been anti-coagulated for atrial fib due to GI bleeding. Difficult situation. Will await GI recs before proceeding with cath. He will likely need PCI if cath performed and this would require dual antiplatelet therapy post stenting. Fortunately he has minimal chest pain this am. BP stable.   Verne CarrowChristopher McAlhany  01/28/2016 12:06 PM

## 2016-01-28 NOTE — Progress Notes (Addendum)
PROGRESS NOTE    Nathaniel Steele  ZOX:[id number] DOB: May 19, 1926 DOA: 01/27/2016 PCP: Delorse Lek, MD    Brief Narrative: Nathaniel Steele is a 83 y.o. male  with past history significant for congestive heart failure, chronic kidney disease, coronary disease, lupus, pacemaker, restless leg who presents to the emergency room chief complaint of syncope.    Assessment & Plan:   Active Problems:   End-stage renal disease (ESRD) (HCC)   Permanent atrial fibrillation (HCC)   Acute on chronic diastolic CHF (congestive heart failure), NYHA class 3 (HCC)   Anemia   Chest pain with moderate risk of acute coronary syndrome   Syncope  1-Syncope;  Suspect related to anemia.   2-Acute on chronic Diastolic HF  Fluids to be removed during HD.   3-Chest Pain; mild elevation troponin.  Plan for cath during this admission.  Cardiology asking for GI consultation.   4-Anemia; Acute on chronic blood loss in setting of GI bleed.  History of small bowel bleed per patient son. Has had work up by Dr Elnoria Howard and [facility] Dr.  He was taken off coumadin for that reason.  Hb baseline 8--- to  Hb on admission at 7.6. He received one unit PRBC on admission.  Plan to have another unit PRBC during HD.    5-RLS, chronic Continue Requip  6-Chronic pain, stable continue Neurontin  Orthostatic hypotension;  Continue Midrin   Glaucoma Continue rest stasis  End-stage renal disease;  continue Fosrenol Nephrology consulted HD today.   DVT prophylaxis: SCD.  Code Status: Full code.  Family Communication: care discussed with multiples family member.  Disposition Plan: to be determine.    Consultants:   Cardiology  Nephrology.     Procedures:  HD   Antimicrobials:   none   Subjective: Denies chest pain at this time.    Objective: Vitals:   01/27/16 1800 01/27/16 2049 01/27/16 2318 01/28/16 0506  BP: 101/62 (!) 102/49 (!) 97/51 (!) 99/50  Pulse: 71 73 71 70  Resp: 16 18 18 18    Temp: 97.8 F (36.6 C) 98.3 F (36.8 C) 98 F (36.7 C) 98.6 F (37 C)  TempSrc: Oral Oral Oral Oral  SpO2: 97% 97% 98% 93%  Weight:    70.5 kg (155 lb 6.4 oz)  Height:        Intake/Output Summary (Last 24 hours) at 01/28/16 1037 Last data filed at 01/28/16 0856  Gross per 24 hour  Intake             1055 ml  Output              325 ml  Net              730 ml   Filed Weights   01/27/16 1044 01/27/16 1547 01/28/16 0506  Weight: 72.1 kg (159 lb) 69.8 kg (153 lb 14.4 oz) 70.5 kg (155 lb 6.4 oz)    Examination:  General exam: Appears calm and comfortable  Respiratory system: Clear to auscultation. Respiratory effort normal. Cardiovascular system: S1 & S2 heard, RRR. No JVD, murmurs, rubs, gallops or clicks. No pedal edema. Gastrointestinal system: Abdomen is nondistended, soft and nontender. No organomegaly or masses felt. Normal bowel sounds heard. Central nervous system: Alert and oriented. No focal neurological deficits. Extremities: Symmetric 5 x 5 power. Skin: No rashes, lesions or ulcers Psychiatry: Judgement and insight appear normal. Mood & affect appropriate.     Data Reviewed: I have personally reviewed following labs and imaging  studies  CBC:  Recent Labs Lab 01/27/16 1051 01/27/16 2153 01/28/16 0029  WBC 4.0  --  4.3  HGB 7.6* 7.8* 7.6*  HCT 22.7* 23.4* 22.8*  MCV 95.4  --  94.2  PLT 77*  --  73*   Basic Metabolic Panel:  Recent Labs Lab 01/27/16 1051 01/27/16 1411 01/28/16 0029  NA 140  --  139  K 3.6  --  4.1  CL 101  --  98*  CO2 26  --  25  GLUCOSE 117*  --  98  BUN 68*  --  79*  CREATININE 5.84*  --  6.29*  CALCIUM 9.1  --  8.8*  MG  --  2.3  --   PHOS  --  7.9*  --    GFR: Estimated Creatinine Clearance: 7.6 mL/min (by C-G formula based on SCr of 6.29 mg/dL (H)). Liver Function Tests: No results for input(s): AST, ALT, ALKPHOS, BILITOT, PROT, ALBUMIN in the last 168 hours. No results for input(s): LIPASE, AMYLASE in the last  168 hours. No results for input(s): AMMONIA in the last 168 hours. Coagulation Profile: No results for input(s): INR, PROTIME in the last 168 hours. Cardiac Enzymes:  Recent Labs Lab 01/27/16 1358 01/27/16 1602 01/28/16 0029  TROPONINI 0.12* 0.14* 0.19*   BNP (last 3 results) No results for input(s): PROBNP in the last 8760 hours. HbA1C: No results for input(s): HGBA1C in the last 72 hours. CBG:  Recent Labs Lab 01/27/16 1204  GLUCAP 115*   Lipid Profile: No results for input(s): CHOL, HDL, LDLCALC, TRIG, CHOLHDL, LDLDIRECT in the last 72 hours. Thyroid Function Tests: No results for input(s): TSH, T4TOTAL, FREET4, T3FREE, THYROIDAB in the last 72 hours. Anemia Panel: No results for input(s): VITAMINB12, FOLATE, FERRITIN, TIBC, IRON, RETICCTPCT in the last 72 hours. Sepsis Labs: No results for input(s): PROCALCITON, LATICACIDVEN in the last 168 hours.  No results found for this or any previous visit (from the past 240 hour(s)).       Radiology Studies: Dg Chest Port 1 View  Result Date: 01/27/2016 CLINICAL DATA:  Chest tightness and syncope today. EXAM: PORTABLE CHEST 1 VIEW COMPARISON:  PA and lateral chest 01/11/2016 and 12/22/2013 FINDINGS: The patient is status post CABG with a pacing device in place. There is marked cardiomegaly and interstitial pulmonary edema. No consolidative process, pneumothorax or effusion. IMPRESSION: Cardiomegaly and interstitial pulmonary edema. Electronically Signed   By: Drusilla Kannerhomas  Dalessio M.D.   On: 01/27/2016 11:37        Scheduled Meds: . aspirin  81 mg Oral Daily  . cycloSPORINE  1 drop Both Eyes BID  . gabapentin  300 mg Oral QPM  . midodrine  2.5 mg Oral BID WC  . pantoprazole  40 mg Oral BID  . rOPINIRole  6 mg Oral QHS  . sodium chloride flush  3 mL Intravenous Q12H   Continuous Infusions:   LOS: 1 day    Time spent: 35 minutes.     Alba Coryegalado, Belkys A, MD [facility] Pager [phone]  If 7PM-7AM,  please contact night-coverage [email or web address] Password [email or web address] 01/28/2016, 10:37 AM

## 2016-01-28 NOTE — Progress Notes (Signed)
Received from dialysis.AAOx4. Assisted back to bed . Tele on. Dinner tray served.

## 2016-01-28 NOTE — Progress Notes (Signed)
PT Cancellation Note  Patient Details Name: Nathaniel Steele MRN: [id number] DOB: 02/13/1926   Cancelled Treatment:    Reason Eval/Treat Not Completed: Patient at procedure or test/unavailable. Pt at HD, will follow as able.    Christiane HaBenjamin J. Tyrick Dunagan, PT, CSCS Pager [phone] Office [phone]  01/28/2016, 4:12 PM

## 2016-01-28 NOTE — Consult Note (Addendum)
Cross cover for Dr. Benson Norway. Reason for Consult: Anemia with heme positive stools. Referring Physician: THP  DOROTEO NICKOLSON is an 83 y.o. male.  HPI: 83 year old white male with multiple medical problems mentioned below, presented with syncope, when he passed out in his car. He was brought to the hospital by EMS. He was found to have anemia with guaiac positive stools on evaluation. Cardiology wants an evaluation by GI so that he can be anticoagulated post-stenting. He has had recurrent chest pain requiring NTG. He has had an extensive GI workup by Dr. Benson Norway and at [facility]. He has small bowel AVM's on his last capsule study done in 2016 . He has had an EGD/Colonoscopy in 2009 when he had tubular adenomas removed. The EGD was normal. He had an EGD in 2015 when focal gastritis was noted. He denies having any frank melena or hematochezia. In the past his anticoagulation was held due to his problems with GI bleeding. He is on PPI's for GERD.   Past Medical History:  Diagnosis Date  . Acute on chronic diastolic CHF (congestive heart failure), NYHA class 3 ([place]) 12/07/2013  . Anemia   . Arthritis   . Benign prostatic hypertrophy   . Blood dyscrasia    one time had low platlet count  . Cellulitis   . Claudication ([place]) 02/19/2012   LE doppler no evidence of arterial insufficiency, lower extremities demonstrate normal values  w/ no evidence of insufficiency  . Constipation   . Coronary artery disease 05/14/2010   stress test - no scintigraphic evidence of inducible myocardial ischemia,; normal study  . Dysrhythmia    atrial fib  . ESRD (end stage renal disease) ([place])    not on dialysis yet  . H/O: HTN (hypertension)   . Lupus nephritis ([place])   . Pacemaker 09/19/2004   implanted  . Pacemaker   . Pericardial effusion 03/12/2011   echo EF 55-60%  . Restless leg syndrome   . Shortness of breath with exertion  . Shoulder joint pain   . SSS (sick sinus syndrome) ([place]) 08/27/2011   echo EF >55%, severe  LAE, moderate RAE, tissue AVR w/ gradients 35 and 32mHg   Past Surgical History:  Procedure Laterality Date  . AORTIC VALVE REPLACEMENT  02/24/2011   [facility]  . APPENDECTOMY    . AV FISTULA PLACEMENT  02/24/2011  . CARDIAC CATHETERIZATION  02/13/2011   mod/severe aortic valve stenosis w peak to peak gradient 25-32 mmHg,  70% stenosis LAD, 30-40%proximal followed by 90% sstenosis in RCA prior to anterior RV margin branch  . CARDIOVERSION N/A 12/09/2013   Procedure: CARDIOVERSION;  Surgeon: MSanda Klein MD;  Location: [facility] ENDOSCOPY;  Service: Cardiovascular;  Laterality: N/A;  . CORONARY ARTERY BYPASS GRAFT  02/24/2011   LIMA to LAD, SVG to distal RCA  . ESOPHAGOGASTRODUODENOSCOPY N/A 06/21/2013   Procedure: ESOPHAGOGASTRODUODENOSCOPY (EGD);  Surgeon: PBeryle Beams MD;  [facility];  Service: Endoscopy;  Laterality: N/A;  . GIVENS CAPSULE STUDY N/A 06/21/2013   Procedure: GIVENS CAPSULE STUDY;  Surgeon: PBeryle Beams MD;  [place] [facility]  Service: Endoscopy;  Laterality: N/A;  . GIVENS CAPSULE STUDY N/A 03/25/2014   Procedure: GIVENS CAPSULE STUDY;  Surgeon: PBeryle Beams MD;  [place] [facility]  Service: Endoscopy;  Laterality: N/A;  . HERNIA REPAIR    . LEFT AND RIGHT HEART CATHETERIZATION WITH CORONARY ANGIOGRAM N/A 02/13/2011   Procedure: LEFT AND RIGHT HEART CATHETERIZATION WITH CORONARY  ANGIOGRAM;  Surgeon: Troy Sine, MD;  [facility];  Service: Cardiovascular;  Laterality: N/A;  . LUMBAR LAMINECTOMY/DECOMPRESSION MICRODISCECTOMY Right 04/16/2012   Procedure: DECOMPRESSIVE L4 - L5/ MICRODISCECTOMY ON THE RIGHT 1 LEVEL;  Surgeon: Tobi Bastos, MD;  Location: [facility] ORS;  Service: Orthopedics;  Laterality: Right;  . PACEMAKER INSERTION  2006   Medtronic EnRhythm  . PERMANENT PACEMAKER GENERATOR CHANGE N/A 05/10/2013   Procedure: PERMANENT PACEMAKER GENERATOR CHANGE;  Surgeon: Sanda Klein, MD;  Location: [place] CATH LAB;   Service: Cardiovascular;  Laterality: N/A;   Family History  Problem Relation Age of Onset  . Hypertension Mother   . Kidney disease Brother    Social History:  reports that he quit smoking about 59 years ago. His smoking use included Cigarettes. He has never used smokeless tobacco. He reports that he does not drink alcohol or use drugs.  Allergies:  Allergies  Allergen Reactions  . Rosuvastatin Calcium Other (See Comments)    myalgias  . Statins Other (See Comments)    myalgias  . Aspirin Nausea Only    Patient states that he can take the enteric coated but not the uncoated  . Feraheme [Ferumoxytol] Rash and Other (See Comments)    Abdominal pain  . Penicillins Hives   Medications: I have reviewed the patient's current medications.  Results for orders placed or performed during the hospital encounter of 01/27/16 (from the past 48 hour(s))  Basic metabolic panel     Status: Abnormal   Collection Time: 01/27/16 10:51 AM  Result Value Ref Range   Sodium 140 135 - 145 mmol/L   Potassium 3.6 3.5 - 5.1 mmol/L   Chloride 101 101 - 111 mmol/L   CO2 26 22 - 32 mmol/L   Glucose, Bld 117 (H) 65 - 99 mg/dL   BUN 68 (H) 6 - 20 mg/dL   Creatinine, Ser 5.84 (H) 0.61 - 1.24 mg/dL   Calcium 9.1 8.9 - 10.3 mg/dL   GFR calc non Af Amer 8 (L) >60 mL/min   GFR calc Af Amer 9 (L) >60 mL/min    Comment: (NOTE) The eGFR has been calculated using the CKD EPI equation. This calculation has not been validated in all clinical situations. eGFR's persistently <60 mL/min signify possible Chronic Kidney Disease.    Anion gap 13 5 - 15  CBC     Status: Abnormal   Collection Time: 01/27/16 10:51 AM  Result Value Ref Range   WBC 4.0 4.0 - 10.5 K/uL   RBC 2.38 (L) 4.22 - 5.81 MIL/uL   Hemoglobin 7.6 (L) 13.0 - 17.0 g/dL   HCT 22.7 (L) 39.0 - 52.0 %   MCV 95.4 78.0 - 100.0 fL   MCH 31.9 26.0 - 34.0 pg   MCHC 33.5 30.0 - 36.0 g/dL   RDW 15.8 (H) 11.5 - 15.5 %   Platelets 77 (L) 150 - 400 K/uL     Comment: REPEATED TO VERIFY PLATELET COUNT CONFIRMED BY SMEAR   POC occult blood, ED Provider will collect     Status: Abnormal   Collection Time: 01/27/16 11:21 AM  Result Value Ref Range   Fecal Occult Bld POSITIVE (A) NEGATIVE  CBG monitoring, ED     Status: Abnormal   Collection Time: 01/27/16 12:04 PM  Result Value Ref Range   Glucose-Capillary 115 (H) 65 - 99 mg/dL  I-stat troponin, ED     Status: Abnormal   Collection Time: 01/27/16 12:26 PM  Result Value Ref  Range   Troponin i, poc 0.10 (HH) 0.00 - 0.08 ng/mL   Comment NOTIFIED PHYSICIAN    Comment 3            Comment: Due to the release kinetics of cTnI, a negative result within the first hours of the onset of symptoms does not rule out myocardial infarction with certainty. If myocardial infarction is still suspected, repeat the test at appropriate intervals.   Troponin I     Status: Abnormal   Collection Time: 01/27/16  1:58 PM  Result Value Ref Range   Troponin I 0.12 (HH) <0.03 ng/mL    Comment: CRITICAL RESULT CALLED TO, READ BACK BY AND VERIFIED WITH: D.ARYAL,RN 01/27/16 '@1547'$  BY V.WILKINS   Magnesium     Status: None   Collection Time: 01/27/16  2:11 PM  Result Value Ref Range   Magnesium 2.3 1.7 - 2.4 mg/dL  Phosphorus     Status: Abnormal   Collection Time: 01/27/16  2:11 PM  Result Value Ref Range   Phosphorus 7.9 (H) 2.5 - 4.6 mg/dL  Troponin I     Status: Abnormal   Collection Time: 01/27/16  4:02 PM  Result Value Ref Range   Troponin I 0.14 (HH) <0.03 ng/mL    Comment: CRITICAL VALUE NOTED.  VALUE IS CONSISTENT WITH PREVIOUSLY REPORTED AND CALLED VALUE.  Type and screen [place] Use in lab blood     Status: None (Preliminary result)   Collection Time: 01/27/16  4:02 PM  Result Value Ref Range   ISSUE DATE / TIME [id number]    Blood Product Unit Number [id number]    PRODUCT CODE E0336V00    Unit Type and Rh 8242    Blood Product Expiration Date [id number]    ISSUE  DATE / TIME [id number]    Blood Product Unit Number [id number]    PRODUCT CODE A2505L97    Unit Type and Rh 6200    Blood Product Expiration Date [id number]   Prepare RBC     Status: None   Collection Time: 01/27/16  4:03 PM  Result Value Ref Range   Order Confirmation ORDER PROCESSED BY BLOOD BANK   Hemoglobin and hematocrit, blood     Status: Abnormal   Collection Time: 01/27/16  9:53 PM  Result Value Ref Range   Hemoglobin 7.8 (L) 13.0 - 17.0 g/dL   HCT 23.4 (L) 39.0 - 52.0 %  Troponin I     Status: Abnormal   Collection Time: 01/28/16 12:29 AM  Result Value Ref Range   Troponin I 0.19 (HH) <0.03 ng/mL    Comment: CRITICAL VALUE NOTED.  VALUE IS CONSISTENT WITH PREVIOUSLY REPORTED AND CALLED VALUE.  Basic metabolic panel     Status: Abnormal   Collection Time: 01/28/16 12:29 AM  Result Value Ref Range   Sodium 139 135 - 145 mmol/L   Potassium 4.1 3.5 - 5.1 mmol/L   Chloride 98 (L) 101 - 111 mmol/L   CO2 25 22 - 32 mmol/L   Glucose, Bld 98 65 - 99 mg/dL   BUN 79 (H) 6 - 20 mg/dL   Creatinine, Ser 6.29 (H) 0.61 - 1.24 mg/dL   Calcium 8.8 (L) 8.9 - 10.3 mg/dL   GFR calc non Af Amer 7 (L) >60 mL/min   GFR calc Af Amer 8 (L) >60 mL/min    Comment: (NOTE) The eGFR has been calculated using the CKD EPI equation. This calculation has not been validated in all clinical situations. eGFR's persistently <  60 mL/min signify possible Chronic Kidney Disease.    Anion gap 16 (H) 5 - 15  CBC     Status: Abnormal   Collection Time: 01/28/16 12:29 AM  Result Value Ref Range   WBC 4.3 4.0 - 10.5 K/uL   RBC 2.42 (L) 4.22 - 5.81 MIL/uL   Hemoglobin 7.6 (L) 13.0 - 17.0 g/dL   HCT 22.8 (L) 39.0 - 52.0 %   MCV 94.2 78.0 - 100.0 fL   MCH 31.4 26.0 - 34.0 pg   MCHC 33.3 30.0 - 36.0 g/dL   RDW 16.1 (H) 11.5 - 15.5 %   Platelets 73 (L) 150 - 400 K/uL    Comment: CONSISTENT WITH PREVIOUS RESULT  Urinalysis, Routine w reflex microscopic     Status: Abnormal   Collection Time: 01/28/16   3:06 AM  Result Value Ref Range   Color, Urine YELLOW YELLOW   APPearance HAZY (A) CLEAR   Specific Gravity, Urine 1.013 1.005 - 1.030   pH 7.0 5.0 - 8.0   Glucose, UA NEGATIVE NEGATIVE mg/dL   Hgb urine dipstick NEGATIVE NEGATIVE   Bilirubin Urine NEGATIVE NEGATIVE   Ketones, ur NEGATIVE NEGATIVE mg/dL   Protein, ur 100 (A) NEGATIVE mg/dL   Nitrite NEGATIVE NEGATIVE   Leukocytes, UA LARGE (A) NEGATIVE   RBC / HPF 0-5 0 - 5 RBC/hpf   WBC, UA TOO NUMEROUS TO COUNT 0 - 5 WBC/hpf   Bacteria, UA RARE (A) NONE SEEN   Squamous Epithelial / LPF 0-5 (A) NONE SEEN  Prepare RBC     Status: None   Collection Time: 01/28/16  9:56 AM  Result Value Ref Range   Order Confirmation ORDER PROCESSED BY BLOOD BANK     Dg Chest Port 1 View  Result Date: 01/27/2016 CLINICAL DATA:  Chest tightness and syncope today. EXAM: PORTABLE CHEST 1 VIEW COMPARISON:  PA and lateral chest 01/11/2016 and 12/22/2013 FINDINGS: The patient is status post CABG with a pacing device in place. There is marked cardiomegaly and interstitial pulmonary edema. No consolidative process, pneumothorax or effusion. IMPRESSION: Cardiomegaly and interstitial pulmonary edema. Electronically Signed   By: Inge Rise M.D.   On: 01/27/2016 11:37    Review of Systems  Constitutional: Positive for malaise/fatigue.  HENT: Negative.   Eyes: Negative.   Respiratory: Positive for shortness of breath.   Cardiovascular: Positive for chest pain.  Gastrointestinal: Positive for blood in stool and heartburn. Negative for abdominal pain, constipation, diarrhea, melena, nausea and vomiting.  Genitourinary: Negative.   Musculoskeletal: Positive for joint pain. Negative for back pain.  Skin: Negative.   Neurological: Positive for loss of consciousness and weakness.   Blood pressure 117/63, pulse 72, temperature 98.8 F (37.1 C), temperature source Oral, resp. rate 20, height 5' 6.5" (1.689 m), weight 70.9 kg (156 lb 4.9 oz), SpO2 98  %. Physical Exam  Constitutional: He is oriented to person, place, and time. He appears well-developed and well-nourished.  HENT:  Head: Normocephalic and atraumatic.  Eyes: Conjunctivae and EOM are normal. Pupils are equal, round, and reactive to light.  Neck: Normal range of motion. Neck supple.  Cardiovascular: An irregularly irregular rhythm present.  Murmur heard.  Systolic murmur is present  Respiratory: Effort normal and breath sounds normal.  Pacemaker present on left chest  GI: Soft. Bowel sounds are normal.  Neurological: He is alert and oriented to person, place, and time.  Skin: Skin is warm and dry.   Assessment/Plan: 1) Anemia with heme positive stools/GERD-will  schedule patient for an enteroscopy tomorrow. Rule out GI bleed from small bowel AVM 's. 2) ESRD on HD 3 times a week. 3) Chronic atrial fibrillation.   4) Recurrent chest pain.  5) Acute on chronic CHF. 6) Restless leg syndrome-on Requip.  Quintina Hakeem 01/28/2016, 8:07 PM

## 2016-01-28 NOTE — Consult Note (Signed)
Reason for Consult: Continuity of ESRD care Referring Physician: Hartley BarefootBelkys Regalado M.D. [facility])  HPI: 83 year old Caucasian man with past medical history significant for end-stage renal disease on hemodialysis on a Monday/Wednesday/Friday schedule [facility]. Also has a history of coronary artery disease, congestive heart failure, restless leg syndrome and presented with recent syncopal events with episodic chest pain. Reports recent dyspnea on exertion without any persistent cough, fever or chills and denies any recent gastrointestinal losses. Denies any overt blood loss. Status post blood transfusion yesterday.  Past Medical History:  Diagnosis Date  . Acute on chronic diastolic CHF (congestive heart failure), NYHA class 3 (HCC) 12/07/2013  . Anemia   . Arthritis   . Benign prostatic hypertrophy   . Blood dyscrasia    one time had low platlet count  . Cellulitis   . Claudication (HCC) 02/19/2012   LE doppler no evidence of arterial insufficiency, lower extremities demonstrate normal values  w/ no evidence of insufficiency  . Constipation   . Coronary artery disease 05/14/2010   stress test - no scintigraphic evidence of inducible myocardial ischemia,; normal study  . Dysrhythmia    atrial fib  . ESRD (end stage renal disease) (HCC)    not on dialysis yet  . H/O: HTN (hypertension)   . Lupus nephritis (HCC)   . Pacemaker 09/19/2004   implanted  . Pacemaker   . Pericardial effusion 03/12/2011   echo EF 55-60%  . Restless leg syndrome   . Shortness of breath with exertion  . Shoulder joint pain   . SSS (sick sinus syndrome) (HCC) 08/27/2011   echo EF >55%, severe LAE, moderate RAE, tissue AVR w/ gradients 35 and 17mmHg    Past Surgical History:  Procedure Laterality Date  . AORTIC VALVE REPLACEMENT  02/24/2011   pericardial tissue valve [facility]  . APPENDECTOMY    . AV FISTULA PLACEMENT  02/24/2011  . CARDIAC CATHETERIZATION  02/13/2011   mod/severe  aortic valve stenosis w peak to peak gradient 25-32 mmHg,  70% stenosis LAD, 30-40%proximal followed by 90% sstenosis in RCA prior to anterior RV margin branch  . CARDIOVERSION N/A 12/09/2013   Procedure: CARDIOVERSION;  Surgeon: Thurmon FairMihai Croitoru, MD;  Location: [facility] ENDOSCOPY;  Service: Cardiovascular;  Laterality: N/A;  . CORONARY ARTERY BYPASS GRAFT  02/24/2011   LIMA to LAD, SVG to distal RCA  . ESOPHAGOGASTRODUODENOSCOPY N/A 06/21/2013   Procedure: ESOPHAGOGASTRODUODENOSCOPY (EGD);  Surgeon: Theda BelfastPatrick D Hung, MD;  [facility];  Service: Endoscopy;  Laterality: N/A;  . GIVENS CAPSULE STUDY N/A 06/21/2013   Procedure: GIVENS CAPSULE STUDY;  Surgeon: Theda BelfastPatrick D Hung, MD;  [facility];  Service: Endoscopy;  Laterality: N/A;  . GIVENS CAPSULE STUDY N/A 03/25/2014   Procedure: GIVENS CAPSULE STUDY;  Surgeon: Theda BelfastPatrick D Hung, MD;  [facility];  Service: Endoscopy;  Laterality: N/A;  . HERNIA REPAIR    . LEFT AND RIGHT HEART CATHETERIZATION WITH CORONARY ANGIOGRAM N/A 02/13/2011   Procedure: LEFT AND RIGHT HEART CATHETERIZATION WITH CORONARY ANGIOGRAM;  Surgeon: Lennette Biharihomas A Kelly, MD;  [facility];  Service: Cardiovascular;  Laterality: N/A;  . LUMBAR LAMINECTOMY/DECOMPRESSION MICRODISCECTOMY Right 04/16/2012   Procedure: DECOMPRESSIVE L4 - L5/ MICRODISCECTOMY ON THE RIGHT 1 LEVEL;  Surgeon: Jacki Conesonald A Gioffre, MD;  Location: [facility] ORS;  Service: Orthopedics;  Laterality: Right;  . PACEMAKER INSERTION  2006   [employer] EnRhythm  . PERMANENT PACEMAKER GENERATOR CHANGE N/A 05/10/2013   Procedure: PERMANENT PACEMAKER GENERATOR CHANGE;  Surgeon:  Thurmon Fair, MD;  Location: [facility] CATH LAB;  Service: Cardiovascular;  Laterality: N/A;    Family History  Problem Relation Age of Onset  . Hypertension Mother   . Kidney disease Brother     Social History:  reports that he quit smoking about 59 years ago. His smoking use included Cigarettes. He has never used smokeless tobacco. He  reports that he does not drink alcohol or use drugs.  Allergies:  Allergies  Allergen Reactions  . Rosuvastatin Calcium Other (See Comments)    myalgias  . Statins Other (See Comments)    myalgias  . Aspirin Nausea Only    Patient states that he can take the enteric coated but not the uncoated  . Feraheme [Ferumoxytol] Rash and Other (See Comments)    Abdominal pain  . Penicillins Hives    Medications:  Scheduled: . sodium chloride   Intravenous Once  . aspirin  81 mg Oral Daily  . cycloSPORINE  1 drop Both Eyes BID  . gabapentin  300 mg Oral QPM  . midodrine  2.5 mg Oral BID WC  . pantoprazole  40 mg Oral BID  . rOPINIRole  6 mg Oral QHS  . sodium chloride flush  3 mL Intravenous Q12H    BMP Latest Ref Rng & Units 01/28/2016 01/27/2016 01/13/2016  Glucose 65 - 99 mg/dL 98 540(J) 99  BUN 6 - 20 mg/dL 81(X) 91(Y) 78(G)  Creatinine 0.61 - 1.24 mg/dL 9.56(O) 1.30(Q) 6.57(Q)  Sodium 135 - 145 mmol/L 139 140 136  Potassium 3.5 - 5.1 mmol/L 4.1 3.6 4.7  Chloride 101 - 111 mmol/L 98(L) 101 98(L)  CO2 22 - 32 mmol/L 25 26 25   Calcium 8.9 - 10.3 mg/dL 4.6(N) 9.1 9.4   CBC Latest Ref Rng & Units 01/28/2016 01/27/2016 01/27/2016  WBC 4.0 - 10.5 K/uL 4.3 - 4.0  Hemoglobin 13.0 - 17.0 g/dL 7.6(L) 7.8(L) 7.6(L)  Hematocrit 39.0 - 52.0 % 22.8(L) 23.4(L) 22.7(L)  Platelets 150 - 400 K/uL 73(L) - 77(L)     Dg Chest Port 1 View  Result Date: 01/27/2016 CLINICAL DATA:  Chest tightness and syncope today. EXAM: PORTABLE CHEST 1 VIEW COMPARISON:  PA and lateral chest 01/11/2016 and 12/22/2013 FINDINGS: The patient is status post CABG with a pacing device in place. There is marked cardiomegaly and interstitial pulmonary edema. No consolidative process, pneumothorax or effusion. IMPRESSION: Cardiomegaly and interstitial pulmonary edema. Electronically Signed   By: Drusilla Kanner M.D.   On: 01/27/2016 11:37    Review of Systems  Constitutional: Positive for malaise/fatigue. Negative for chills and  fever.  HENT: Negative.   Respiratory: Positive for shortness of breath. Negative for cough and hemoptysis.        Exertional dyspnea  Cardiovascular: Positive for chest pain and palpitations. Negative for orthopnea, claudication and leg swelling.       Reports exertional dyspnea  Gastrointestinal: Negative.   Genitourinary: Negative.   Musculoskeletal: Negative.   Skin: Negative.   Neurological: Positive for dizziness and weakness.  Endo/Heme/Allergies: Negative.    Blood pressure (!) 99/50, pulse 70, temperature 98.6 F (37 C), temperature source Oral, resp. rate 18, height 5' 6.5" (1.689 m), weight 70.5 kg (155 lb 6.4 oz), SpO2 93 %. Physical Exam  Nursing note and vitals reviewed. Constitutional: He is oriented to person, place, and time. He appears well-developed and well-nourished. No distress.  HENT:  Head: Normocephalic and atraumatic.  Nose: Nose normal.  Eyes: EOM are normal. Pupils are equal, round, and reactive  to light. No scleral icterus.  Neck: Neck supple. No JVD present. No thyromegaly present.  Cardiovascular: Normal rate, regular rhythm and normal heart sounds.  Exam reveals no friction rub.   Respiratory: Effort normal and breath sounds normal. He has no wheezes. He has no rales.  GI: Soft. Bowel sounds are normal. There is no tenderness. There is no rebound.  Musculoskeletal: He exhibits no edema.  Left radiocephalic fistula-poor augmentation  Neurological: He is alert and oriented to person, place, and time.  Skin: Skin is warm and dry.    Assessment/Plan: 1. Syncopal event/chest pain: Appears to possibly be associated with symptomatic anemia-no further cardiology workup/intervention at this time. 2. End-stage renal disease: Hemodialysis today per outpatient schedule, will transfuse 1 unit packed red cells during dialysis 3. Anemia: Denies any overt blood loss, status post 1 unit PRBC yesterday, schedule a second unit today. 4. Hypotension: Judicious  ultrafiltration with hemodialysis to limit further syncopal event/orthostatic drops. 5. Secondary hyperparathyroidism: Receive a phosphorus binder/VDRA with dialysis.   Kattia Selley K. 01/28/2016, 9:56 AM

## 2016-01-29 ENCOUNTER — Encounter (HOSPITAL_COMMUNITY)
Admission: EM | Disposition: A | Discharge status: Home Health | Payer: Self-pay | Source: Home / Self Care | Attending: Internal Medicine

## 2016-01-29 ENCOUNTER — Inpatient Hospital Stay (HOSPITAL_COMMUNITY): Payer: Medicare Other | Admitting: Certified Registered Nurse Anesthetist

## 2016-01-29 ENCOUNTER — Encounter (HOSPITAL_COMMUNITY): Payer: Self-pay

## 2016-01-29 ENCOUNTER — Inpatient Hospital Stay (HOSPITAL_COMMUNITY): Payer: Medicare Other

## 2016-01-29 DIAGNOSIS — R55 Syncope and collapse: Secondary | ICD-10-CM

## 2016-01-29 DIAGNOSIS — I214 Non-ST elevation (NSTEMI) myocardial infarction: Principal | ICD-10-CM

## 2016-01-29 HISTORY — PX: ENTEROSCOPY: SHX5533

## 2016-01-29 LAB — ECHOCARDIOGRAM COMPLETE
Height: 66.5 in
WEIGHTICAEL: 2483.2 [oz_av]

## 2016-01-29 LAB — TYPE AND SCREEN
BLOOD PRODUCT EXPIRATION DATE: 201801112359
BLOOD PRODUCT EXPIRATION DATE: 201801112359
ISSUE DATE / TIME: 201801071736
ISSUE DATE / TIME: 201801081611
Unit Type and Rh: 6200
Unit Type and Rh: 6200

## 2016-01-29 LAB — CBC
HCT: 25.6 % — ABNORMAL LOW (ref 39.0–52.0)
Hemoglobin: 8.6 g/dL — ABNORMAL LOW (ref 13.0–17.0)
MCH: 31.6 pg (ref 26.0–34.0)
MCHC: 33.6 g/dL (ref 30.0–36.0)
MCV: 94.1 fL (ref 78.0–100.0)
Platelets: 79 10*3/uL — ABNORMAL LOW (ref 150–400)
RBC: 2.72 MIL/uL — ABNORMAL LOW (ref 4.22–5.81)
RDW: 16.7 % — AB (ref 11.5–15.5)
WBC: 4.2 10*3/uL (ref 4.0–10.5)

## 2016-01-29 SURGERY — ENTEROSCOPY
Anesthesia: Monitor Anesthesia Care

## 2016-01-29 MED ORDER — PROPOFOL 500 MG/50ML IV EMUL
INTRAVENOUS | Status: DC | PRN
  Administered 2016-01-29: 50 ug/kg/min via INTRAVENOUS

## 2016-01-29 MED ORDER — PHENYLEPHRINE HCL 10 MG/ML IJ SOLN
INTRAMUSCULAR | Status: DC | PRN
  Administered 2016-01-29: 40 ug via INTRAVENOUS

## 2016-01-29 MED ORDER — PROPOFOL 10 MG/ML IV BOLUS
INTRAVENOUS | Status: DC | PRN
  Administered 2016-01-29: 10 mg via INTRAVENOUS

## 2016-01-29 MED ORDER — POLYETHYLENE GLYCOL 3350 17 G PO PACK
17.0000 g | PACK | Freq: Every day | ORAL | Status: DC
  Administered 2016-01-29 – 2016-02-01 (×4): 17 g via ORAL
  Filled 2016-01-29 (×4): qty 1

## 2016-01-29 MED ORDER — SODIUM CHLORIDE 0.9 % IV SOLN
INTRAVENOUS | Status: DC
  Administered 2016-01-29 (×2): via INTRAVENOUS
  Administered 2016-01-29: 500 mL via INTRAVENOUS

## 2016-01-29 MED ORDER — LIDOCAINE HCL (CARDIAC) 20 MG/ML IV SOLN
INTRAVENOUS | Status: DC | PRN
  Administered 2016-01-29: 80 mg via INTRAVENOUS

## 2016-01-29 MED ORDER — ISOSORBIDE MONONITRATE ER 30 MG PO TB24
15.0000 mg | ORAL_TABLET | Freq: Every day | ORAL | Status: DC
Start: 2016-01-29 — End: 2016-02-01
  Administered 2016-01-29 – 2016-02-01 (×3): 15 mg via ORAL
  Filled 2016-01-29 (×4): qty 1

## 2016-01-29 NOTE — Progress Notes (Signed)
PT Cancellation Note  Patient Details Name: Rutherford Guysaul C Valin MRN: [id number] DOB: 01/14/1927   Cancelled Treatment:    Reason Eval/Treat Not Completed: Patient at procedure or test/unavailable   Fabio Asaevon J Jourdyn Hasler 01/29/2016, 1:36 PM Charlotte Crumbevon Adamary Savary, PT DPT  [phone]

## 2016-01-29 NOTE — Interval H&P Note (Signed)
History and Physical Interval Note:  01/29/2016 1:58 PM  Nathaniel Steele  has presented today for surgery, with the diagnosis of GI bleed  The various methods of treatment have been discussed with the patient and family. After consideration of risks, benefits and other options for treatment, the patient has consented to  Procedure(s): ENTEROSCOPY (N/A) as a surgical intervention .  The patient's history has been reviewed, patient examined, no change in status, stable for surgery.  I have reviewed the patient's chart and labs.  Questions were answered to the patient's satisfaction.     Cayetano Mikita D

## 2016-01-29 NOTE — H&P (View-Only) (Signed)
Patient Name: Nathaniel Steele Date of Encounter: 01/29/2016  Primary Cardiologist: Dr. Karl Lukeroitoru   Hospital Problem List     Active Problems:   End-stage renal disease (ESRD) (HCC)   Permanent atrial fibrillation (HCC)   Acute on chronic diastolic CHF (congestive heart failure), NYHA class 3 (HCC)   Anemia   Chest pain with moderate risk of acute coronary syndrome   Syncope     Subjective   Feels well, did have some chest pain last night that radiated to his jaw.   Inpatient Medications    Scheduled Meds: . aspirin  81 mg Oral Daily  . cycloSPORINE  1 drop Both Eyes BID  . gabapentin  300 mg Oral QPM  . midodrine  2.5 mg Oral BID WC  . pantoprazole  40 mg Oral BID  . rOPINIRole  6 mg Oral QHS  . sodium chloride flush  3 mL Intravenous Q12H   Continuous Infusions: . sodium chloride 20 mL/hr at 01/29/16 0800   PRN Meds: acetaminophen **OR** acetaminophen, nitroGLYCERIN, traZODone   Vital Signs    Vitals:   01/28/16 1845 01/28/16 2055 01/29/16 0004 01/29/16 0634  BP: 117/63 (!) 104/38 (!) 94/47 111/67  Pulse: 72 70 70 70  Resp: 20 20 20 20   Temp: 98.8 F (37.1 C) 99.8 F (37.7 C) 100.1 F (37.8 C) 98.9 F (37.2 C)  TempSrc: Oral Oral Oral Oral  SpO2:  95% 93% 96%  Weight: 156 lb 4.9 oz (70.9 kg)   155 lb 3.2 oz (70.4 kg)  Height:        Intake/Output Summary (Last 24 hours) at 01/29/16 1014 Last data filed at 01/29/16 0900  Gross per 24 hour  Intake              795 ml  Output             1000 ml  Net             -205 ml   Filed Weights   01/28/16 1509 01/28/16 1845 01/29/16 0634  Weight: 157 lb 10.1 oz (71.5 kg) 156 lb 4.9 oz (70.9 kg) 155 lb 3.2 oz (70.4 kg)    Physical Exam  GEN: Elderly male in no acute distress.  HEENT: Grossly normal.  Neck: Supple, no JVD, carotid bruits, or masses. Cardiac: RRR, 2/6 systolic  Murmur. No rubs, or gallops. No clubbing, cyanosis, edema.  Radials/DP/PT 2+ and equal bilaterally.  Respiratory:  Respirations  regular and unlabored, clear to auscultation bilaterally. GI: Soft, nontender, nondistended, BS + x 4. MS: no deformity or atrophy. Skin: warm and dry, no rash. Neuro:  Strength and sensation are intact. Psych: AAOx3.  Normal affect   Labs    CBC  Recent Labs  01/27/16 1051 01/27/16 2153 01/28/16 0029  WBC 4.0  --  4.3  HGB 7.6* 7.8* 7.6*  HCT 22.7* 23.4* 22.8*  MCV 95.4  --  94.2  PLT 77*  --  73*   Basic Metabolic Panel  Recent Labs  01/27/16 1051 01/27/16 1411 01/28/16 0029  NA 140  --  139  K 3.6  --  4.1  CL 101  --  98*  CO2 26  --  25  GLUCOSE 117*  --  98  BUN 68*  --  79*  CREATININE 5.84*  --  6.29*  CALCIUM 9.1  --  8.8*  MG  --  2.3  --   PHOS  --  7.9*  --  Cardiac Enzymes  Recent Labs  01/27/16 1358 01/27/16 1602 01/28/16 0029  TROPONINI 0.12* 0.14* 0.19*     Telemetry    V paced- Personally Reviewed   Radiology    Dg Chest Port 1 View  Result Date: 01/27/2016 CLINICAL DATA:  Chest tightness and syncope today. EXAM: PORTABLE CHEST 1 VIEW COMPARISON:  PA and lateral chest 01/11/2016 and 12/22/2013 FINDINGS: The patient is status post CABG with a pacing device in place. There is marked cardiomegaly and interstitial pulmonary edema. No consolidative process, pneumothorax or effusion. IMPRESSION: Cardiomegaly and interstitial pulmonary edema. Electronically Signed   By: Drusilla Kanner M.D.   On: 01/27/2016 11:37      Patient Profile     Nathaniel Steele is a 83 year old male with a past medical history of chronic diastolic CHF, ESRD on HD, permanent Afib (not on anticoagulation due to GI bleeding history), high grade AV block s/p PPM (dual chamber now programmed VVIR), CAD s/p remote CABG, aortic valve replacement with bio prosthesis. He presented with syncope and chest discomfort.     Assessment & Plan    1. NSTEMI: Presents with syncope, and elevated troponin. He was admitted from 01/11/16-01/13/16 with chest pain and had nuclear  study, no ischemia on nuc. EF was read at 15% but Echo confirmed normal EF. He had an episode of NSVT that admission, for which his beta blocker was increased. It was felt his pain was pericarditis for which colchicine was given. He declined heart cath last admission but saw Theodore Demark in our office and agreed to cath which was scheduled for 01/31/16.   He needs heart cath this admission, but FOBT + . GI plans for enteroscopy tomorrow to rule out GI bleed from AVM's.  With continued intermittent chest pain, will add 15mg  isosorbide to his regimen.   2. Permanent Atrial fib: rate controlled, no anticoagulation with history of GI bleed.   3. AS s/p bioprosthetic AVR  4. ASCAD s/p remote CABG with LIMA to LAD and SVG to RCA. Continue ASA.   5. Hyperlipidemia - currently not on statin therapy due to intolerance. Consider PCSK 9 inhibitor as outp.   6. Chronic diastolic CHF - chest xray shows interstitial edema. Volume management per nephrology  7. ESRD on HD - per nephrology  8. SSS s/p PPM   Signed, Little Ishikawa, NP  01/29/2016, 10:14 AM   I have personally seen and examined this patient with Suzzette Righter, NP. I agree with the assessment and plan as outlined above. Cardiac cath is indicated but will wait until we have a completed GI workup to schedule this. He is asymptomatic today.   Verne Carrow 01/29/2016 11:03 AM

## 2016-01-29 NOTE — Op Note (Signed)
[facility] Patient Name: Nathaniel Steele Procedure Date : 01/29/2016 MRN: [id number] Attending MD: Jeani HawkingPatrick Yoshiaki Kreuser , MD Date of Birth: 03/10/1926 CSN: [id number] Age: 83 Admit Type: Inpatient Procedure:                Small bowel enteroscopy Indications:              Recurrent bleeding in the small bowel Providers:                Jeani HawkingPatrick Elza Sortor, MD, Dwain SarnaPatricia Ford, RN, Darletta MollJackie Aiken                            Tech, Technician, Sarita Haverokoshi Flowers, CRNA Referring MD:              Medicines:                Propofol per Anesthesia Complications:            No immediate complications. Estimated Blood Loss:     Estimated blood loss: none. Procedure:                Pre-Anesthesia Assessment:                           - Prior to the procedure, a History and Physical                            was performed, and patient medications and                            allergies were reviewed. The patient's tolerance of                            previous anesthesia was also reviewed. The risks                            and benefits of the procedure and the sedation                            options and risks were discussed with the patient.                            All questions were answered, and informed consent                            was obtained. Prior Anticoagulants: The patient has                            taken no previous anticoagulant or antiplatelet                            agents. ASA Grade Assessment: III - A patient with                            severe systemic disease. After reviewing the risks  and benefits, the patient was deemed in                            satisfactory condition to undergo the procedure.                           - Sedation was administered by an anesthesia                            professional. Deep sedation was attained.                           After obtaining informed consent, the endoscope was       passed under direct vision. Throughout the                            procedure, the patient's blood pressure, pulse, and                            oxygen saturations were monitored continuously. The                            EC-[id number] ([id number]) scope was introduced through                            the mouth and advanced to the mid-jejunum. The                            small bowel enteroscopy was accomplished without                            difficulty. The patient tolerated the procedure                            well. Scope In: Scope Out: Findings:      The esophagus was normal.      The stomach was normal.      The examined duodenum was normal.      There was no evidence of significant pathology in the entire examined       portion of jejunum.      Deep intubation into the jejunum was achieved twice. Slow and careful       examination of the mucosa was negative for any evidence of bleeding.       During one of the capsule endoscopies, a small bowel site was identified       as there was active bleeding. The presumption was that he had bleeding       from AVMs. Impression:               - Normal esophagus.                           - Normal stomach.                           - Normal examined duodenum.                           -  The examined portion of the jejunum was normal.                           - No specimens collected. Recommendation:           - Return patient to hospital ward for ongoing care.                           - Anticoagulate if necessary as the benefits                            outweigh the risks.                           - Patient also noted to be thrombocytopenic, which                            requries work up. No clear evidence of portal HTN                            during the upper GI examination. Procedure Code(s):        --- Professional ---                           [phone], Small intestinal endoscopy, enteroscopy                             beyond second portion of duodenum, not including                            ileum; diagnostic, including collection of                            specimen(s) by brushing or washing, when performed                            (separate procedure) Diagnosis Code(s):        --- Professional ---                           K92.2, Gastrointestinal hemorrhage, unspecified CPT copyright 2016 [employer]. All rights reserved. The codes documented in this report are preliminary and upon coder review may  be revised to meet current compliance requirements. Jeani Hawking, MD Jeani Hawking, MD 01/29/2016 2:47:03 PM This report has been signed electronically. Number of Addenda: 0

## 2016-01-29 NOTE — Progress Notes (Signed)
PROGRESS NOTE    Nathaniel Steele  ZOX:[id number] DOB: 1926-02-07 DOA: 01/27/2016 PCP: Delorse Lek, MD    Brief Narrative: Nathaniel Steele is a 83 y.o. male  with past history significant for congestive heart failure, chronic kidney disease, coronary disease, lupus, pacemaker, restless leg who presents to the emergency room chief complaint of syncope.    Assessment & Plan:   Active Problems:   End-stage renal disease (ESRD) (HCC)   Permanent atrial fibrillation (HCC)   Acute on chronic diastolic CHF (congestive heart failure), NYHA class 3 (HCC)   Anemia   Chest pain with moderate risk of acute coronary syndrome   Syncope  1-Syncope;  Suspect related to anemia, vs cardiac arrhythmia.  Cardiology following.   2-Acute on chronic Diastolic HF  Fluids to be removed during HD.   3-N-STEMI. Chest Pain; mild elevation troponin.  Plan for cath during this admission.  Cardiology asking for GI consultation. Plan for enteroscopy today.  Started on Imdur today.   4-Anemia; Acute on chronic blood loss in setting of GI bleed.  History of small bowel bleed per patient son. Has had work up by Dr Elnoria Howard and [facility] Dr.  He was taken off coumadin for that reason.  Hb baseline 8-- Hb on admission at 7.6. He received one unit PRBC on admission. And one unit 1-08.  Hb today at 8.6.   5-RLS, chronic Continue Requip  6-Chronic pain, stable continue Neurontin  Orthostatic hypotension;  Continue Midodrine.    Glaucoma Continue rest stasis  End-stage renal disease;  continue Fosrenol Nephrology consulted Had HD 1-08.   DVT prophylaxis: SCD.  Code Status: Full code.  Family Communication: care discussed with multiples family member.  Disposition Plan: to be determine.    Consultants:   Cardiology  Nephrology.     Procedures:  HD   Antimicrobials:   none   Subjective: Denies chest pain at this time. Had an episode of chest pain last night     Objective: Vitals:   01/29/16 0004 01/29/16 0634 01/29/16 1151 01/29/16 1253  BP: (!) 94/47 111/67 118/63 (!) 104/59  Pulse: 70 70 70 71  Resp: 20 20 18  (!) 24  Temp: 100.1 F (37.8 C) 98.9 F (37.2 C) 97.9 F (36.6 C) 98.5 F (36.9 C)  TempSrc: Oral Oral Oral Oral  SpO2: 93% 96% 98% 92%  Weight:  70.4 kg (155 lb 3.2 oz)  70.3 kg (155 lb)  Height:    5' 6.5" (1.689 m)    Intake/Output Summary (Last 24 hours) at 01/29/16 1323 Last data filed at 01/29/16 0900  Gross per 24 hour  Intake              575 ml  Output             1000 ml  Net             -425 ml   Filed Weights   01/28/16 1845 01/29/16 0634 01/29/16 1253  Weight: 70.9 kg (156 lb 4.9 oz) 70.4 kg (155 lb 3.2 oz) 70.3 kg (155 lb)    Examination:  General exam: Appears calm and comfortable  Respiratory system: Clear to auscultation. Respiratory effort normal. Cardiovascular system: S1 & S2 heard, RRR. No JVD, murmurs, rubs, gallops or clicks. No pedal edema. Gastrointestinal system: Abdomen is nondistended, soft and nontender. No organomegaly or masses felt. Normal bowel sounds heard. Central nervous system: Alert and oriented. No focal neurological deficits. Extremities: Symmetric 5 x 5 power. Skin: No  rashes, lesions or ulcers Psychiatry: Judgement and insight appear normal. Mood & affect appropriate.     Data Reviewed: I have personally reviewed following labs and imaging studies  CBC:  Recent Labs Lab 01/27/16 1051 01/27/16 2153 01/28/16 0029 01/29/16 1020  WBC 4.0  --  4.3 4.2  HGB 7.6* 7.8* 7.6* 8.6*  HCT 22.7* 23.4* 22.8* 25.6*  MCV 95.4  --  94.2 94.1  PLT 77*  --  73* 79*   Basic Metabolic Panel:  Recent Labs Lab 01/27/16 1051 01/27/16 1411 01/28/16 0029  NA 140  --  139  K 3.6  --  4.1  CL 101  --  98*  CO2 26  --  25  GLUCOSE 117*  --  98  BUN 68*  --  79*  CREATININE 5.84*  --  6.29*  CALCIUM 9.1  --  8.8*  MG  --  2.3  --   PHOS  --  7.9*  --    GFR: Estimated  Creatinine Clearance: 7.6 mL/min (by C-G formula based on SCr of 6.29 mg/dL (H)). Liver Function Tests: No results for input(s): AST, ALT, ALKPHOS, BILITOT, PROT, ALBUMIN in the last 168 hours. No results for input(s): LIPASE, AMYLASE in the last 168 hours. No results for input(s): AMMONIA in the last 168 hours. Coagulation Profile: No results for input(s): INR, PROTIME in the last 168 hours. Cardiac Enzymes:  Recent Labs Lab 01/27/16 1358 01/27/16 1602 01/28/16 0029  TROPONINI 0.12* 0.14* 0.19*   BNP (last 3 results) No results for input(s): PROBNP in the last 8760 hours. HbA1C: No results for input(s): HGBA1C in the last 72 hours. CBG:  Recent Labs Lab 01/27/16 1204  GLUCAP 115*   Lipid Profile: No results for input(s): CHOL, HDL, LDLCALC, TRIG, CHOLHDL, LDLDIRECT in the last 72 hours. Thyroid Function Tests: No results for input(s): TSH, T4TOTAL, FREET4, T3FREE, THYROIDAB in the last 72 hours. Anemia Panel: No results for input(s): VITAMINB12, FOLATE, FERRITIN, TIBC, IRON, RETICCTPCT in the last 72 hours. Sepsis Labs: No results for input(s): PROCALCITON, LATICACIDVEN in the last 168 hours.  No results found for this or any previous visit (from the past 240 hour(s)).       Radiology Studies: No results found.      Scheduled Meds: . [MAR Hold] aspirin  81 mg Oral Daily  . [MAR Hold] cycloSPORINE  1 drop Both Eyes BID  . [MAR Hold] gabapentin  300 mg Oral QPM  . [MAR Hold] isosorbide mononitrate  15 mg Oral Daily  . [MAR Hold] midodrine  2.5 mg Oral BID WC  . [MAR Hold] pantoprazole  40 mg Oral BID  . [MAR Hold] rOPINIRole  6 mg Oral QHS  . [MAR Hold] sodium chloride flush  3 mL Intravenous Q12H   Continuous Infusions: . sodium chloride 500 mL (01/29/16 1301)     LOS: 2 days    Time spent: 35 minutes.     Alba Coryegalado, Chrissy Ealey A, MD [facility] Pager [phone]  If 7PM-7AM, please contact night-coverage [email or web address] Password  [email or web address] 01/29/2016, 1:23 PM

## 2016-01-29 NOTE — Anesthesia Postprocedure Evaluation (Addendum)
Anesthesia Post Note  Patient: Nathaniel Steele  Procedure(s) Performed: Procedure(s) (LRB): ENTEROSCOPY (N/A)  Patient location during evaluation: Endoscopy Anesthesia Type: MAC Level of consciousness: sedated and awake Pain management: pain level controlled Vital Signs Assessment: post-procedure vital signs reviewed and stable Cardiovascular status: blood pressure returned to baseline Anesthetic complications: no       Last Vitals:  Vitals:   01/29/16 1445 01/29/16 1455  BP: (!) 114/48 (!) 114/58  Pulse: 70 70  Resp: 17 18  Temp:      Last Pain:  Vitals:   01/29/16 1253  TempSrc: Oral  PainSc:                  Joanne Brander,JAMES TERRILL

## 2016-01-29 NOTE — Anesthesia Preprocedure Evaluation (Signed)
Anesthesia Evaluation  Patient identified by MRN, date of birth, ID band Patient awake    Reviewed: Allergy & Precautions, NPO status , Patient's Chart, lab work & pertinent test results  History of Anesthesia Complications Negative for: history of anesthetic complications  Airway Mallampati: II  TM Distance: >3 FB     Dental  (+) Edentulous Upper, Edentulous Lower   Pulmonary shortness of breath, former smoker,    breath sounds clear to auscultation       Cardiovascular + CAD and +CHF  + dysrhythmias + pacemaker  Rhythm:Regular Rate:Normal     Neuro/Psych    GI/Hepatic   Endo/Other    Renal/GU ESRFRenal disease     Musculoskeletal  (+) Arthritis ,   Abdominal   Peds  Hematology  (+) Blood dyscrasia, anemia ,   Anesthesia Other Findings   Reproductive/Obstetrics                             Anesthesia Physical Anesthesia Plan  ASA: IV  Anesthesia Plan: MAC   Post-op Pain Management:    Induction: Intravenous  Airway Management Planned: Natural Airway  Additional Equipment:   Intra-op Plan:   Post-operative Plan: Extubation in OR  Informed Consent: I have reviewed the patients History and Physical, chart, labs and discussed the procedure including the risks, benefits and alternatives for the proposed anesthesia with the patient or authorized representative who has indicated his/her understanding and acceptance.   Dental advisory given  Plan Discussed with: CRNA  Anesthesia Plan Comments:         Anesthesia Quick Evaluation

## 2016-01-29 NOTE — Progress Notes (Signed)
Patient ID: Nathaniel Steele, male   DOB: 04/25/1926, 83 y.o.   MRN: [id number] [facility] Progress Note   Assessment/ Plan:   1. Syncopal event/chest pain: With evidence of anemia/GI blood loss and NSTEMI-recommended to undergo coronary angiogram which is scheduled for Thursday after enteroscopy to evaluate for site of GI bleeding given the need for anticoagulation if he requires a stent. He reports intermittent shortness of breath on exertion/chest tightness overnight however none at this time. 2. End-stage renal disease: Hemodialysis will be ordered for tomorrow to continue his Monday/Wednesday/Friday schedule-avoiding heparin because of recent GI bleed 3. Anemia:  Hemoccult positive and now status post 2 units PRBC transfusion-no CBC available from this morning, monitor with labs tomorrow morning. 4. Hypotension:  ultrafiltration done cautiously yesterday dialysis and noted to limit aggravating orthostatic dizziness. 5. Secondary hyperparathyroidism: Receive a phosphorus binder/VDRA with dialysis. 6. History of coronary artery disease status post CABG, permanent atrial fibrillation, chronic diastolic heart failure: Not on anticoagulation because of history of GI bleed, scheduled for repeat coronary angiogram on 1/11.  Subjective:   Reports to be comfortable this morning-awaiting enteroscopy    Objective:   BP 111/67 (BP Location: Right Arm)   Pulse 70   Temp 98.9 F (37.2 C) (Oral)   Resp 20   Ht 5' 6.5" (1.689 m)   Wt 70.4 kg (155 lb 3.2 oz) Comment: scale c  SpO2 96%   BMI 24.67 kg/m   Physical Exam: WUJ:[id number]:Comfortably sitting up on the side of his bed CVS: Pulse irregularly irregular, normal rate Resp: Clear to auscultation, no rales Abd: Soft, obese, nontender Ext: No lower extremity edema, left radiocephalic fistula  Labs: BMET  Recent Labs Lab 01/27/16 1051 01/27/16 1411 01/28/16 0029  NA 140  --  139  K 3.6  --  4.1  CL 101  --  98*  CO2 26  --  25   GLUCOSE 117*  --  98  BUN 68*  --  79*  CREATININE 5.84*  --  6.29*  CALCIUM 9.1  --  8.8*  PHOS  --  7.9*  --    CBC  Recent Labs Lab 01/27/16 1051 01/27/16 2153 01/28/16 0029  WBC 4.0  --  4.3  HGB 7.6* 7.8* 7.6*  HCT 22.7* 23.4* 22.8*  MCV 95.4  --  94.2  PLT 77*  --  73*   Medications:    . aspirin  81 mg Oral Daily  . cycloSPORINE  1 drop Both Eyes BID  . gabapentin  300 mg Oral QPM  . midodrine  2.5 mg Oral BID WC  . pantoprazole  40 mg Oral BID  . rOPINIRole  6 mg Oral QHS  . sodium chloride flush  3 mL Intravenous Q12H   Zetta BillsJay Adalyn Pennock, MD 01/29/2016, 8:10 AM

## 2016-01-29 NOTE — Progress Notes (Signed)
Patient Name: Nathaniel Steele Date of Encounter: 01/29/2016  Primary Cardiologist: Dr. Karl Lukeroitoru   Hospital Problem List     Active Problems:   End-stage renal disease (ESRD) (HCC)   Permanent atrial fibrillation (HCC)   Acute on chronic diastolic CHF (congestive heart failure), NYHA class 3 (HCC)   Anemia   Chest pain with moderate risk of acute coronary syndrome   Syncope     Subjective   Feels well, did have some chest pain last night that radiated to his jaw.   Inpatient Medications    Scheduled Meds: . aspirin  81 mg Oral Daily  . cycloSPORINE  1 drop Both Eyes BID  . gabapentin  300 mg Oral QPM  . midodrine  2.5 mg Oral BID WC  . pantoprazole  40 mg Oral BID  . rOPINIRole  6 mg Oral QHS  . sodium chloride flush  3 mL Intravenous Q12H   Continuous Infusions: . sodium chloride 20 mL/hr at 01/29/16 0800   PRN Meds: acetaminophen **OR** acetaminophen, nitroGLYCERIN, traZODone   Vital Signs    Vitals:   01/28/16 1845 01/28/16 2055 01/29/16 0004 01/29/16 0634  BP: 117/63 (!) 104/38 (!) 94/47 111/67  Pulse: 72 70 70 70  Resp: 20 20 20 20   Temp: 98.8 F (37.1 C) 99.8 F (37.7 C) 100.1 F (37.8 C) 98.9 F (37.2 C)  TempSrc: Oral Oral Oral Oral  SpO2:  95% 93% 96%  Weight: 156 lb 4.9 oz (70.9 kg)   155 lb 3.2 oz (70.4 kg)  Height:        Intake/Output Summary (Last 24 hours) at 01/29/16 1014 Last data filed at 01/29/16 0900  Gross per 24 hour  Intake              795 ml  Output             1000 ml  Net             -205 ml   Filed Weights   01/28/16 1509 01/28/16 1845 01/29/16 0634  Weight: 157 lb 10.1 oz (71.5 kg) 156 lb 4.9 oz (70.9 kg) 155 lb 3.2 oz (70.4 kg)    Physical Exam  GEN: Elderly male in no acute distress.  HEENT: Grossly normal.  Neck: Supple, no JVD, carotid bruits, or masses. Cardiac: RRR, 2/6 systolic  Murmur. No rubs, or gallops. No clubbing, cyanosis, edema.  Radials/DP/PT 2+ and equal bilaterally.  Respiratory:  Respirations  regular and unlabored, clear to auscultation bilaterally. GI: Soft, nontender, nondistended, BS + x 4. MS: no deformity or atrophy. Skin: warm and dry, no rash. Neuro:  Strength and sensation are intact. Psych: AAOx3.  Normal affect   Labs    CBC  Recent Labs  01/27/16 1051 01/27/16 2153 01/28/16 0029  WBC 4.0  --  4.3  HGB 7.6* 7.8* 7.6*  HCT 22.7* 23.4* 22.8*  MCV 95.4  --  94.2  PLT 77*  --  73*   Basic Metabolic Panel  Recent Labs  01/27/16 1051 01/27/16 1411 01/28/16 0029  NA 140  --  139  K 3.6  --  4.1  CL 101  --  98*  CO2 26  --  25  GLUCOSE 117*  --  98  BUN 68*  --  79*  CREATININE 5.84*  --  6.29*  CALCIUM 9.1  --  8.8*  MG  --  2.3  --   PHOS  --  7.9*  --  Cardiac Enzymes  Recent Labs  01/27/16 1358 01/27/16 1602 01/28/16 0029  TROPONINI 0.12* 0.14* 0.19*     Telemetry    V paced- Personally Reviewed   Radiology    Dg Chest Port 1 View  Result Date: 01/27/2016 CLINICAL DATA:  Chest tightness and syncope today. EXAM: PORTABLE CHEST 1 VIEW COMPARISON:  PA and lateral chest 01/11/2016 and 12/22/2013 FINDINGS: The patient is status post CABG with a pacing device in place. There is marked cardiomegaly and interstitial pulmonary edema. No consolidative process, pneumothorax or effusion. IMPRESSION: Cardiomegaly and interstitial pulmonary edema. Electronically Signed   By: Drusilla Kanner M.D.   On: 01/27/2016 11:37      Patient Profile     Nathaniel Steele is a 83 year old male with a past medical history of chronic diastolic CHF, ESRD on HD, permanent Afib (not on anticoagulation due to GI bleeding history), high grade AV block s/p PPM (dual chamber now programmed VVIR), CAD s/p remote CABG, aortic valve replacement with bio prosthesis. He presented with syncope and chest discomfort.     Assessment & Plan    1. NSTEMI: Presents with syncope, and elevated troponin. He was admitted from 01/11/16-01/13/16 with chest pain and had nuclear  study, no ischemia on nuc. EF was read at 15% but Echo confirmed normal EF. He had an episode of NSVT that admission, for which his beta blocker was increased. It was felt his pain was pericarditis for which colchicine was given. He declined heart cath last admission but saw Theodore Demark in our office and agreed to cath which was scheduled for 01/31/16.   He needs heart cath this admission, but FOBT + . GI plans for enteroscopy tomorrow to rule out GI bleed from AVM's.  With continued intermittent chest pain, will add 15mg  isosorbide to his regimen.   2. Permanent Atrial fib: rate controlled, no anticoagulation with history of GI bleed.   3. AS s/p bioprosthetic AVR  4. ASCAD s/p remote CABG with LIMA to LAD and SVG to RCA. Continue ASA.   5. Hyperlipidemia - currently not on statin therapy due to intolerance. Consider PCSK 9 inhibitor as outp.   6. Chronic diastolic CHF - chest xray shows interstitial edema. Volume management per nephrology  7. ESRD on HD - per nephrology  8. SSS s/p PPM   Signed, Little Ishikawa, NP  01/29/2016, 10:14 AM   I have personally seen and examined this patient with Suzzette Righter, NP. I agree with the assessment and plan as outlined above. Cardiac cath is indicated but will wait until we have a completed GI workup to schedule this. He is asymptomatic today.   Verne Carrow 01/29/2016 11:03 AM

## 2016-01-29 NOTE — Transfer of Care (Signed)
Immediate Anesthesia Transfer of Care Note  Patient: Nathaniel Steele  Procedure(s) Performed: Procedure(s): ENTEROSCOPY (N/A)  Patient Location: PACU and Endoscopy Unit  Anesthesia Type:MAC  Level of Consciousness: awake and alert   Airway & Oxygen Therapy: Patient Spontanous Breathing and Patient connected to nasal cannula oxygen  Post-op Assessment: Report given to RN and Post -op Vital signs reviewed and stable  Post vital signs: Reviewed and stable  Last Vitals:  Vitals:   01/29/16 1253 01/29/16 1435  BP: (!) 104/59 (!) 94/52  Pulse: 71 73  Resp: (!) 24 (!) 24  Temp: 36.9 C     Last Pain:  Vitals:   01/29/16 1253  TempSrc: Oral  PainSc:          Complications: No apparent anesthesia complications

## 2016-01-29 NOTE — Telephone Encounter (Signed)
PT IS IN THE HOSPITAL-HOPEFULLY THEY CAN DO WHILE IN THE HOSPITAL

## 2016-01-30 DIAGNOSIS — I5032 Chronic diastolic (congestive) heart failure: Secondary | ICD-10-CM

## 2016-01-30 DIAGNOSIS — E785 Hyperlipidemia, unspecified: Secondary | ICD-10-CM

## 2016-01-30 MED ORDER — LANTHANUM CARBONATE 500 MG PO CHEW
1000.0000 mg | CHEWABLE_TABLET | Freq: Three times a day (TID) | ORAL | Status: DC
  Administered 2016-01-30 – 2016-01-31 (×3): 1000 mg via ORAL
  Filled 2016-01-30 (×7): qty 2

## 2016-01-30 MED ORDER — HEPARIN SODIUM (PORCINE) 1000 UNIT/ML DIALYSIS: 2000.0000 [IU] | INTRAMUSCULAR | Status: DC | PRN

## 2016-01-30 NOTE — Progress Notes (Signed)
   Subjective: Chest discomfort last night  NOne today   Objective: Vitals:   01/29/16 1445 01/29/16 1455 01/29/16 2034 01/30/16 0344  BP: (!) 114/48 (!) 114/58 (!) 110/58 (!) 109/55  Pulse: 70 70 76 70  Resp: 17 18 20 20   Temp:   98.1 F (36.7 C) 98.5 F (36.9 C)  TempSrc:   Oral Oral  SpO2: 96% 97% 96% 94%  Weight:    156 lb 14.4 oz (71.2 kg)  Height:       Weight change: -2 lb 10.1 oz (-1.192 kg)  Intake/Output Summary (Last 24 hours) at 01/30/16 0824 Last data filed at 01/30/16 0600  Gross per 24 hour  Intake              740 ml  Output              100 ml  Net              640 ml    General: Alert, awake, oriented x3, in no acute distress Neck:  JVP is normal Heart: Regular rate and rhythm,  Gr II/VI systoilic murmur at base , rubs, gallops.  Lungs: Clear to auscultation.  No rales or wheezes. Exemities:  No edema.   Neuro: Grossly intact, nonfocal.  Tel  Paced Lab Results: Results for orders placed or performed during the hospital encounter of 01/27/16 (from the past 24 hour(s))  CBC     Status: Abnormal   Collection Time: 01/29/16 10:20 AM  Result Value Ref Range   WBC 4.2 4.0 - 10.5 K/uL   RBC 2.72 (L) 4.22 - 5.81 MIL/uL   Hemoglobin 8.6 (L) 13.0 - 17.0 g/dL   HCT 09.825.6 (L) 11.939.0 - 14.752.0 %   MCV 94.1 78.0 - 100.0 fL   MCH 31.6 26.0 - 34.0 pg   MCHC 33.6 30.0 - 36.0 g/dL   RDW 82.916.7 (H) 56.211.5 - 13.015.5 %   Platelets 79 (L) 150 - 400 K/uL    Studies/Results: No results found.  Medications: REviewed     @PROBHOSP @  1  NSTEMI  Hx CAD  Remote CABG (LIMA to LAD; SVG to RCA)  PLan for cath tomorrow    2  GI  Normal esophagus, stomach, duodenum. 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

## 2016-01-30 NOTE — Progress Notes (Signed)
PT Cancellation Note  Patient Details Name: Nathaniel Steele MRN: [id number] DOB: 10/13/1926   Cancelled Treatment:    Reason Eval/Treat Not Completed: Medical issues which prohibited therapy. Pt with troponin levels trending upwards and plan for cath tomorrow. PT will continue to f/u with pt as appropriate.    Alessandra BevelsJennifer M Preslyn Warr 01/30/2016, 1:42 PM

## 2016-01-30 NOTE — Progress Notes (Signed)
PROGRESS NOTE  DIJUAN SLEETH ZOX:[id number] DOB: 1926/08/01 DOA: 01/27/2016 PCP: Delorse Lek, MD   LOS: 3 days   Brief Narrative: Vic Ripper Pulliamis a 83 y.o.malewith past history significant for congestive heart failure, chronic kidney disease, coronary disease, lupus, pacemaker, restless leg who presents to the emergency room chief complaint of syncope.  Assessment & Plan: Active Problems:   End-stage renal disease (ESRD) (HCC)   Permanent atrial fibrillation (HCC)   Acute on chronic diastolic CHF (congestive heart failure), NYHA class 3 (HCC)   Anemia   Chest pain with moderate risk of acute coronary syndrome   Syncope  Syncope - Suspect related to anemia, vs cardiac arrhythmia.  - Cardiology following, plan for cardiac catheterization tomorrow 1/11  Acute on chronic Diastolic HF  - Fluid management per nephrology with hemodialysis, he appears euvolemic on exam today  NSTEMI - Plan for cath during this admission as per cardiology. Concern for GI bleed, he was followed by gastroenterology and underwent upper endoscopy on 1/9 without any source for GI bleeding  Anemia; Acute on chronic blood loss in setting of GI bleed.  - Status post upper endoscopy on 1/9 per Dr. Elnoria Howard, appreciate input, no clear source for GI bleeding  RLS, chronic - Continue Requip  Chronic pain, stable - continue Neurontin  Orthostatic hypotension - Continue Midodrine.   End-stage renal disease  - Nephrology consulted, on HD  Thrombocytopenia - Chronic, stable, outpatient workup   DVT prophylaxis: SCDs Code Status: Full code Family Communication: no family bedside Disposition Plan: TBD  Consultants:   Cardiology   Nephrology   Procedures:   HD  Antimicrobials:  None    Subjective: - no chest pain, shortness of breath, no abdominal pain, nausea or vomiting.   Objective: Vitals:   01/29/16 1455 01/29/16 2034 01/30/16 0344 01/30/16 1154  BP: (!) 114/58 (!) 110/58 (!)  109/55 118/69  Pulse: 70 76 70 76  Resp: 18 20 20 20   Temp:  98.1 F (36.7 C) 98.5 F (36.9 C) 98.1 F (36.7 C)  TempSrc:  Oral Oral Oral  SpO2: 97% 96% 94% 91%  Weight:   71.2 kg (156 lb 14.4 oz)   Height:        Intake/Output Summary (Last 24 hours) at 01/30/16 1513 Last data filed at 01/30/16 1408  Gross per 24 hour  Intake              960 ml  Output              200 ml  Net              760 ml   Filed Weights   01/29/16 0634 01/29/16 1253 01/30/16 0344  Weight: 70.4 kg (155 lb 3.2 oz) 70.3 kg (155 lb) 71.2 kg (156 lb 14.4 oz)    Examination: Constitutional: NAD Vitals:   01/29/16 1455 01/29/16 2034 01/30/16 0344 01/30/16 1154  BP: (!) 114/58 (!) 110/58 (!) 109/55 118/69  Pulse: 70 76 70 76  Resp: 18 20 20 20   Temp:  98.1 F (36.7 C) 98.5 F (36.9 C) 98.1 F (36.7 C)  TempSrc:  Oral Oral Oral  SpO2: 97% 96% 94% 91%  Weight:   71.2 kg (156 lb 14.4 oz)   Height:       Respiratory: clear to auscultation bilaterally, no wheezing, no crackles.  Cardiovascular: Regular rate and rhythm, no murmurs / rubs / gallops.  Abdomen: no tenderness. Bowel sounds positive.  Musculoskeletal: no clubbing / cyanosis  Neurologic: non focal    Data Reviewed: I have personally reviewed following labs and imaging studies  CBC:  Recent Labs Lab 01/27/16 1051 01/27/16 2153 01/28/16 0029 01/29/16 1020  WBC 4.0  --  4.3 4.2  HGB 7.6* 7.8* 7.6* 8.6*  HCT 22.7* 23.4* 22.8* 25.6*  MCV 95.4  --  94.2 94.1  PLT 77*  --  73* 79*   Basic Metabolic Panel:  Recent Labs Lab 01/27/16 1051 01/27/16 1411 01/28/16 0029  NA 140  --  139  K 3.6  --  4.1  CL 101  --  98*  CO2 26  --  25  GLUCOSE 117*  --  98  BUN 68*  --  79*  CREATININE 5.84*  --  6.29*  CALCIUM 9.1  --  8.8*  MG  --  2.3  --   PHOS  --  7.9*  --    GFR: Estimated Creatinine Clearance: 7.6 mL/min (by C-G formula based on SCr of 6.29 mg/dL (H)). Liver Function Tests: No results for input(s): AST, ALT,  ALKPHOS, BILITOT, PROT, ALBUMIN in the last 168 hours. No results for input(s): LIPASE, AMYLASE in the last 168 hours. No results for input(s): AMMONIA in the last 168 hours. Coagulation Profile: No results for input(s): INR, PROTIME in the last 168 hours. Cardiac Enzymes:  Recent Labs Lab 01/27/16 1358 01/27/16 1602 01/28/16 0029  TROPONINI 0.12* 0.14* 0.19*   BNP (last 3 results) No results for input(s): PROBNP in the last 8760 hours. HbA1C: No results for input(s): HGBA1C in the last 72 hours. CBG:  Recent Labs Lab 01/27/16 1204  GLUCAP 115*   Lipid Profile: No results for input(s): CHOL, HDL, LDLCALC, TRIG, CHOLHDL, LDLDIRECT in the last 72 hours. Thyroid Function Tests: No results for input(s): TSH, T4TOTAL, FREET4, T3FREE, THYROIDAB in the last 72 hours. Anemia Panel: No results for input(s): VITAMINB12, FOLATE, FERRITIN, TIBC, IRON, RETICCTPCT in the last 72 hours. Urine analysis:    Component Value Date/Time   COLORURINE YELLOW 01/28/2016 0306   APPEARANCEUR HAZY (A) 01/28/2016 0306   LABSPEC 1.013 01/28/2016 0306   PHURINE 7.0 01/28/2016 0306   GLUCOSEU NEGATIVE 01/28/2016 0306   HGBUR NEGATIVE 01/28/2016 0306   BILIRUBINUR NEGATIVE 01/28/2016 0306   KETONESUR NEGATIVE 01/28/2016 0306   PROTEINUR 100 (A) 01/28/2016 0306   UROBILINOGEN 0.2 03/17/2014 1753   NITRITE NEGATIVE 01/28/2016 0306   LEUKOCYTESUR LARGE (A) 01/28/2016 0306   Sepsis Labs: Invalid input(s): PROCALCITONIN, LACTICIDVEN  No results found for this or any previous visit (from the past 240 hour(s)).    Radiology Studies: No results found.   Scheduled Meds: . aspirin  81 mg Oral Daily  . cycloSPORINE  1 drop Both Eyes BID  . gabapentin  300 mg Oral QPM  . isosorbide mononitrate  15 mg Oral Daily  . lanthanum  1,000 mg Oral TID WC  . midodrine  2.5 mg Oral BID WC  . pantoprazole  40 mg Oral BID  . polyethylene glycol  17 g Oral Daily  . rOPINIRole  6 mg Oral QHS  . sodium  chloride flush  3 mL Intravenous Q12H   Continuous Infusions:   Pamella Pertostin Gherghe, MD, PhD [facility] Pager [phone]  If 7PM-7AM, please contact night-coverage [email or web address] Password [email or web address] 01/30/2016, 3:13 PM

## 2016-01-30 NOTE — Progress Notes (Deleted)
Patient doing well today. Ambulating with the supervision of his daughter in the room. Patient complained of indigestion, MD called for prn tums. Patient is having lunch at this time and denies any needs.

## 2016-01-30 NOTE — Progress Notes (Signed)
Patient ID: Nathaniel Steele, male   DOB: 10/28/1926, 83 y.o.   MRN: [id number] [facility] Progress Note   Assessment/ Plan:   1. Syncopal event/chest pain: With evidence of anemia/GI blood loss and NSTEMI- Cleared by gastroenterology for anticoagulation based on negative enteroscopy. Plan for coronary angiogram noted tomorrow. 2. End-stage renal disease: Hemodialysis will be ordered for Today to continue his Monday/Wednesday/Friday schedule. No labs this morning-will be checked with dialysis. 3. Anemia:  Hemoccult positive and now status post 2 units PRBC transfusion- Will check a CBC with hemodialysis today. Enteroscopy negative. 4. Hypotension:  will monitor closely with ultrafiltration at dialysis to limit orthostatic drops. 5. Secondary hyperparathyroidism: Receive a phosphorus binder/VDRA with dialysis. 6. History of coronary artery disease status post CABG, permanent atrial fibrillation, chronic diastolic heart failure: Not on anticoagulation because of history of GI bleed, scheduled for coronary angiogram on 1/11.  Subjective:   Had some chest pain/chest tightness overnight-resolved this morning.    Objective:   BP (!) 109/55 (BP Location: Right Arm)   Pulse 70   Temp 98.5 F (36.9 C) (Oral)   Resp 20   Ht 5' 6.5" (1.689 m)   Wt 71.2 kg (156 lb 14.4 oz) Comment: scale c  SpO2 94%   BMI 24.94 kg/m   Physical Exam: NFA:OZHYQMVHQIOGen:Comfortably sitting up on the side of his bed, Eating breakfast CVS: Pulse irregularly irregular, normal rate Resp: Clear to auscultation, no rales Abd: Soft, obese, nontender Ext: No lower extremity edema, left radiocephalic fistula  Labs: BMET  Recent Labs Lab 01/27/16 1051 01/27/16 1411 01/28/16 0029  NA 140  --  139  K 3.6  --  4.1  CL 101  --  98*  CO2 26  --  25  GLUCOSE 117*  --  98  BUN 68*  --  79*  CREATININE 5.84*  --  6.29*  CALCIUM 9.1  --  8.8*  PHOS  --  7.9*  --    CBC  Recent Labs Lab 01/27/16 1051 01/27/16 2153  01/28/16 0029 01/29/16 1020  WBC 4.0  --  4.3 4.2  HGB 7.6* 7.8* 7.6* 8.6*  HCT 22.7* 23.4* 22.8* 25.6*  MCV 95.4  --  94.2 94.1  PLT 77*  --  73* 79*   Medications:    . aspirin  81 mg Oral Daily  . cycloSPORINE  1 drop Both Eyes BID  . gabapentin  300 mg Oral QPM  . isosorbide mononitrate  15 mg Oral Daily  . midodrine  2.5 mg Oral BID WC  . pantoprazole  40 mg Oral BID  . polyethylene glycol  17 g Oral Daily  . rOPINIRole  6 mg Oral QHS  . sodium chloride flush  3 mL Intravenous Q12H   Zetta BillsJay Remas Sobel, MD 01/30/2016, 8:10 AM

## 2016-01-30 NOTE — Progress Notes (Signed)
Received pt from dialysis AAOx3. Assisted back to bed. Tele on. Ate dinner. Instructed NPO post midnight for  cath in AM. meds given

## 2016-01-30 NOTE — Care Management Note (Signed)
Case Management Note  Patient Details  Name: Nathaniel Steele MRN: [id number] Date of Birth: 06/29/1926  Subjective/Objective:  Admitted with CHF                  Action/Plan: CM talked to patient with his spouse and son at the bedside; P[place] - [facility] in [place]; has private insurance with Medicare and also [facility] [place]; he use a cane as needed; [facility]; [facility] offered, [facility]; [facility] for [facility]; CM will continue to follow for DCP  Expected Discharge Date:     Possibly 02/02/2016             Expected Discharge Plan:  Home w Home Health Services  Discharge planning Services  CM Consult   Choice offered to:  Patient, Adult Children  HH Arranged:  RN, Disease Management, PT HH [facility]  [facility]  Status of Service:  In process, will continue to follow  Reola MosherChandler, Khyler Urda L, RN,MHA,BSN [phone] 01/30/2016, 12:16 PM

## 2016-01-30 NOTE — Progress Notes (Signed)
Patient and family had questions regarding his HD for today and his schedule catheterization on tomorrow. All questions answer and patient doing well today with not issues or concerns. Family at the bedside.

## 2016-01-31 ENCOUNTER — Encounter (HOSPITAL_COMMUNITY)
Admission: EM | Disposition: A | Discharge status: Home Health | Payer: Self-pay | Source: Home / Self Care | Attending: Internal Medicine

## 2016-01-31 ENCOUNTER — Ambulatory Visit (HOSPITAL_COMMUNITY): Admit: 2016-01-31 | Payer: Medicare Other | Admitting: Cardiovascular Disease

## 2016-01-31 DIAGNOSIS — I2510 Atherosclerotic heart disease of native coronary artery without angina pectoris: Secondary | ICD-10-CM

## 2016-01-31 DIAGNOSIS — R7989 Other specified abnormal findings of blood chemistry: Secondary | ICD-10-CM

## 2016-01-31 DIAGNOSIS — R778 Other specified abnormalities of plasma proteins: Secondary | ICD-10-CM

## 2016-01-31 HISTORY — PX: CARDIAC CATHETERIZATION: SHX172

## 2016-01-31 LAB — RENAL FUNCTION PANEL
Albumin: 2.9 g/dL — ABNORMAL LOW (ref 3.5–5.0)
Anion gap: 10 (ref 5–15)
BUN: 37 mg/dL — AB (ref 6–20)
CALCIUM: 9.1 mg/dL (ref 8.9–10.3)
CO2: 29 mmol/L (ref 22–32)
CREATININE: 3.73 mg/dL — AB (ref 0.61–1.24)
Chloride: 98 mmol/L — ABNORMAL LOW (ref 101–111)
GFR, EST AFRICAN AMERICAN: 15 mL/min — AB (ref >60)
GFR, EST NON AFRICAN AMERICAN: 13 mL/min — AB (ref >60)
Glucose, Bld: 88 mg/dL (ref 65–99)
Phosphorus: 5.9 mg/dL — ABNORMAL HIGH (ref 2.5–4.6)
Potassium: 3.9 mmol/L (ref 3.5–5.1)
SODIUM: 137 mmol/L (ref 135–145)

## 2016-01-31 LAB — CBC
HCT: 25.7 % — ABNORMAL LOW (ref 39.0–52.0)
HEMATOCRIT: 24.9 % — AB (ref 39.0–52.0)
HEMOGLOBIN: 8.4 g/dL — AB (ref 13.0–17.0)
Hemoglobin: 8.3 g/dL — ABNORMAL LOW (ref 13.0–17.0)
MCH: 32.1 pg (ref 26.0–34.0)
MCH: 30.5 pg (ref 26.0–34.0)
MCHC: 33.7 g/dL (ref 30.0–36.0)
MCHC: 32.3 g/dL (ref 30.0–36.0)
MCV: 95 fL (ref 78.0–100.0)
MCV: 94.5 fL (ref 78.0–100.0)
PLATELETS: 107 10*3/uL — AB (ref 150–400)
Platelets: 88 10*3/uL — ABNORMAL LOW (ref 150–400)
RBC: 2.62 MIL/uL — AB (ref 4.22–5.81)
RBC: 2.72 MIL/uL — ABNORMAL LOW (ref 4.22–5.81)
RDW: 15.5 % (ref 11.5–15.5)
RDW: 15.4 % (ref 11.5–15.5)
WBC: 3.4 10*3/uL — AB (ref 4.0–10.5)
WBC: 3.1 10*3/uL — AB (ref 4.0–10.5)

## 2016-01-31 LAB — BASIC METABOLIC PANEL
ANION GAP: 12 (ref 5–15)
BUN: 26 mg/dL — AB (ref 6–20)
CHLORIDE: 98 mmol/L — AB (ref 101–111)
CO2: 29 mmol/L (ref 22–32)
Calcium: 9.1 mg/dL (ref 8.9–10.3)
Creatinine, Ser: 2.93 mg/dL — ABNORMAL HIGH (ref 0.61–1.24)
GFR calc Af Amer: 21 mL/min — ABNORMAL LOW (ref >60)
GFR calc non Af Amer: 18 mL/min — ABNORMAL LOW (ref >60)
GLUCOSE: 102 mg/dL — AB (ref 65–99)
POTASSIUM: 3.5 mmol/L (ref 3.5–5.1)
Sodium: 139 mmol/L (ref 135–145)

## 2016-01-31 LAB — PROTIME-INR
INR: 1.07
Prothrombin Time: 14 seconds (ref 11.4–15.2)

## 2016-01-31 SURGERY — LEFT HEART CATH AND CORS/GRAFTS ANGIOGRAPHY

## 2016-01-31 SURGERY — CORONARY/GRAFT ANGIOGRAPHY

## 2016-01-31 MED ORDER — SODIUM CHLORIDE 0.9 % IV SOLN
INTRAVENOUS | Status: DC | PRN
  Administered 2016-01-31: 15 mL/h via INTRAVENOUS

## 2016-01-31 MED ORDER — SODIUM CHLORIDE 0.9 % IV SOLN: 250.0000 mL | INTRAVENOUS | Status: DC | PRN

## 2016-01-31 MED ORDER — SODIUM CHLORIDE 0.9% FLUSH
3.0000 mL | Freq: Two times a day (BID) | INTRAVENOUS | Status: DC
  Administered 2016-01-31: 3 mL via INTRAVENOUS

## 2016-01-31 MED ORDER — LIDOCAINE HCL (PF) 1 % IJ SOLN
5.0000 mL | INTRAMUSCULAR | Status: DC | PRN
Start: 2016-01-31 — End: 2016-02-01

## 2016-01-31 MED ORDER — LIDOCAINE-PRILOCAINE 2.5-2.5 % EX CREA: 1.0000 "application " | TOPICAL_CREAM | CUTANEOUS | Status: DC | PRN

## 2016-01-31 MED ORDER — IOPAMIDOL (ISOVUE-370) INJECTION 76%
INTRAVENOUS | Status: AC
  Filled 2016-01-31: qty 50

## 2016-01-31 MED ORDER — IOPAMIDOL (ISOVUE-370) INJECTION 76%
INTRAVENOUS | Status: AC
  Filled 2016-01-31: qty 125

## 2016-01-31 MED ORDER — HEPARIN SODIUM (PORCINE) 1000 UNIT/ML DIALYSIS
1000.0000 [IU] | INTRAMUSCULAR | Status: DC | PRN
Start: 2016-01-31 — End: 2016-02-01

## 2016-01-31 MED ORDER — LIDOCAINE HCL (PF) 1 % IJ SOLN
INTRAMUSCULAR | Status: DC | PRN
  Administered 2016-01-31: 15 mL via INTRADERMAL

## 2016-01-31 MED ORDER — SODIUM CHLORIDE 0.9 % IV SOLN: 100.0000 mL | INTRAVENOUS | Status: DC | PRN

## 2016-01-31 MED ORDER — SODIUM CHLORIDE 0.9% FLUSH: 3.0000 mL | INTRAVENOUS | Status: DC | PRN

## 2016-01-31 MED ORDER — SODIUM CHLORIDE 0.9 % IV SOLN: INTRAVENOUS | Status: DC

## 2016-01-31 MED ORDER — IOPAMIDOL (ISOVUE-370) INJECTION 76%
INTRAVENOUS | Status: DC | PRN
  Administered 2016-01-31: 125 mL via INTRA_ARTERIAL

## 2016-01-31 MED ORDER — ASPIRIN 81 MG PO CHEW: 81.0000 mg | CHEWABLE_TABLET | ORAL | Status: DC

## 2016-01-31 MED ORDER — HEPARIN (PORCINE) IN NACL 2-0.9 UNIT/ML-% IJ SOLN
INTRAMUSCULAR | Status: DC | PRN
  Administered 2016-01-31: 1000 mL

## 2016-01-31 MED ORDER — LIDOCAINE HCL (PF) 1 % IJ SOLN
INTRAMUSCULAR | Status: AC
  Filled 2016-01-31: qty 30

## 2016-01-31 MED ORDER — HEPARIN SODIUM (PORCINE) 1000 UNIT/ML DIALYSIS
40.0000 [IU]/kg | INTRAMUSCULAR | Status: DC | PRN
Start: 2016-01-31 — End: 2016-02-01

## 2016-01-31 MED ORDER — PENTAFLUOROPROP-TETRAFLUOROETH EX AERO: 1.0000 "application " | INHALATION_SPRAY | CUTANEOUS | Status: DC | PRN

## 2016-01-31 MED ORDER — SODIUM CHLORIDE 0.9% FLUSH: 3.0000 mL | Freq: Two times a day (BID) | INTRAVENOUS | Status: DC

## 2016-01-31 MED ORDER — HEPARIN (PORCINE) IN NACL 2-0.9 UNIT/ML-% IJ SOLN
INTRAMUSCULAR | Status: AC
  Filled 2016-01-31: qty 1000

## 2016-01-31 MED ORDER — ASPIRIN 81 MG PO CHEW
81.0000 mg | CHEWABLE_TABLET | ORAL | Status: AC
  Administered 2016-01-31: 81 mg via ORAL
  Filled 2016-01-31: qty 1

## 2016-01-31 MED ORDER — ALTEPLASE 2 MG IJ SOLR: 2.0000 mg | Freq: Once | INTRAMUSCULAR | Status: DC | PRN

## 2016-01-31 MED ORDER — HEPARIN SODIUM (PORCINE) 1000 UNIT/ML DIALYSIS: 2000.0000 [IU] | INTRAMUSCULAR | Status: DC | PRN

## 2016-01-31 SURGICAL SUPPLY — 10 items
CATH INFINITI 5 FR IM (CATHETERS) ×3 IMPLANT
CATH INFINITI 5FR JL5 (CATHETERS) ×3 IMPLANT
CATH INFINITI 5FR MULTPACK ANG (CATHETERS) ×3 IMPLANT
KIT HEART LEFT (KITS) ×3 IMPLANT
PACK CARDIAC CATHETERIZATION (CUSTOM PROCEDURE TRAY) ×3 IMPLANT
SHEATH PINNACLE 5F 10CM (SHEATH) ×3 IMPLANT
TRANSDUCER W/STOPCOCK (MISCELLANEOUS) ×3 IMPLANT
TUBING CIL FLEX 10 FLL-RA (TUBING) ×3 IMPLANT
WIRE EMERALD 3MM-J .035X150CM (WIRE) ×3 IMPLANT
WIRE HI TORQ VERSACORE-J 145CM (WIRE) ×3 IMPLANT

## 2016-01-31 NOTE — Progress Notes (Signed)
PROGRESS NOTE  AARSH FRISTOE EAV:[id number] DOB: Mar 30, 1926 DOA: 01/27/2016 PCP: Delorse Lek, MD   LOS: 4 days   Brief Narrative: Vic Ripper Pulliamis a 83 y.o.malewith past history significant for congestive heart failure, chronic kidney disease, coronary disease, lupus, pacemaker, restless leg who presents to the emergency room chief complaint of syncope.  Assessment & Plan: Active Problems:   End-stage renal disease (ESRD) (HCC)   Permanent atrial fibrillation (HCC)   Acute on chronic diastolic CHF (congestive heart failure), NYHA class 3 (HCC)   Anemia   Chest pain with moderate risk of acute coronary syndrome   Syncope  Syncope - Suspect related to anemia, vs cardiac arrhythmia.  - Cardiology following, plan for cardiac catheterization today 1/11  Acute on chronic Diastolic HF  - Fluid management per nephrology with hemodialysis, he appears euvolemic on exam today  NSTEMI - Plan for cath during this admission as per cardiology. Concern for GI bleed, he was followed by gastroenterology and underwent upper endoscopy on 1/9 without any source for GI bleeding  Anemia; Acute on chronic blood loss in setting of GI bleed.  - Status post upper endoscopy on 1/9 per Dr. Elnoria Howard, appreciate input, no clear source for GI bleeding  RLS, chronic - Continue Requip  Chronic pain, stable - continue Neurontin  Orthostatic hypotension - Continue Midodrine.   End-stage renal disease  - Nephrology consulted, on HD  Thrombocytopenia - Chronic, stable, outpatient workup   DVT prophylaxis: SCDs Code Status: Full code Family Communication: no family bedside Disposition Plan: TBD  Consultants:   Cardiology   Nephrology   Procedures:   HD  Antimicrobials:  None    Subjective: - no chest pain, shortness of breath, no abdominal pain, nausea or vomiting.   Objective: Vitals:   01/31/16 0220 01/31/16 0451 01/31/16 1123 01/31/16 1139  BP: (!) 112/59 106/64 (!) 99/53     Pulse: 71 74 71   Resp: 20 20 18    Temp:   98.3 F (36.8 C)   TempSrc: Oral Oral Oral   SpO2: 93% 92% 90% 95%  Weight:  69.7 kg (153 lb 11.2 oz)    Height:        Intake/Output Summary (Last 24 hours) at 01/31/16 1251 Last data filed at 01/31/16 0600  Gross per 24 hour  Intake              480 ml  Output             1989 ml  Net            -1509 ml   Filed Weights   01/30/16 1725 01/30/16 2055 01/31/16 0451  Weight: 71.8 kg (158 lb 4.6 oz) 70 kg (154 lb 5.2 oz) 69.7 kg (153 lb 11.2 oz)    Examination: Constitutional: NAD Vitals:   01/31/16 0220 01/31/16 0451 01/31/16 1123 01/31/16 1139  BP: (!) 112/59 106/64 (!) 99/53   Pulse: 71 74 71   Resp: 20 20 18    Temp:   98.3 F (36.8 C)   TempSrc: Oral Oral Oral   SpO2: 93% 92% 90% 95%  Weight:  69.7 kg (153 lb 11.2 oz)    Height:       Respiratory: clear to auscultation bilaterally, no wheezing, no crackles.  Cardiovascular: Regular rate and rhythm, no murmurs / rubs / gallops.  Abdomen: no tenderness. Bowel sounds positive.  Musculoskeletal: no clubbing / cyanosis Neurologic: non focal    Data Reviewed: I have personally reviewed following labs  and imaging studies  CBC:  Recent Labs Lab 01/27/16 1051 01/27/16 2153 01/28/16 0029 01/29/16 1020 01/31/16 0405  WBC 4.0  --  4.3 4.2 3.4*  HGB 7.6* 7.8* 7.6* 8.6* 8.4*  HCT 22.7* 23.4* 22.8* 25.6* 24.9*  MCV 95.4  --  94.2 94.1 95.0  PLT 77*  --  73* 79* 88*   Basic Metabolic Panel:  Recent Labs Lab 01/27/16 1051 01/27/16 1411 01/28/16 0029 01/31/16 0405  NA 140  --  139 139  K 3.6  --  4.1 3.5  CL 101  --  98* 98*  CO2 26  --  25 29  GLUCOSE 117*  --  98 102*  BUN 68*  --  79* 26*  CREATININE 5.84*  --  6.29* 2.93*  CALCIUM 9.1  --  8.8* 9.1  MG  --  2.3  --   --   PHOS  --  7.9*  --   --    GFR: Estimated Creatinine Clearance: 16.3 mL/min (by C-G formula based on SCr of 2.93 mg/dL (H)). Liver Function Tests: No results for input(s): AST, ALT,  ALKPHOS, BILITOT, PROT, ALBUMIN in the last 168 hours. No results for input(s): LIPASE, AMYLASE in the last 168 hours. No results for input(s): AMMONIA in the last 168 hours. Coagulation Profile:  Recent Labs Lab 01/31/16 0607  INR 1.07   Cardiac Enzymes:  Recent Labs Lab 01/27/16 1358 01/27/16 1602 01/28/16 0029  TROPONINI 0.12* 0.14* 0.19*   BNP (last 3 results) No results for input(s): PROBNP in the last 8760 hours. HbA1C: No results for input(s): HGBA1C in the last 72 hours. CBG:  Recent Labs Lab 01/27/16 1204  GLUCAP 115*   Lipid Profile: No results for input(s): CHOL, HDL, LDLCALC, TRIG, CHOLHDL, LDLDIRECT in the last 72 hours. Thyroid Function Tests: No results for input(s): TSH, T4TOTAL, FREET4, T3FREE, THYROIDAB in the last 72 hours. Anemia Panel: No results for input(s): VITAMINB12, FOLATE, FERRITIN, TIBC, IRON, RETICCTPCT in the last 72 hours. Urine analysis:    Component Value Date/Time   COLORURINE YELLOW 01/28/2016 0306   APPEARANCEUR HAZY (A) 01/28/2016 0306   LABSPEC 1.013 01/28/2016 0306   PHURINE 7.0 01/28/2016 0306   GLUCOSEU NEGATIVE 01/28/2016 0306   HGBUR NEGATIVE 01/28/2016 0306   BILIRUBINUR NEGATIVE 01/28/2016 0306   KETONESUR NEGATIVE 01/28/2016 0306   PROTEINUR 100 (A) 01/28/2016 0306   UROBILINOGEN 0.2 03/17/2014 1753   NITRITE NEGATIVE 01/28/2016 0306   LEUKOCYTESUR LARGE (A) 01/28/2016 0306   Sepsis Labs: Invalid input(s): PROCALCITONIN, LACTICIDVEN  No results found for this or any previous visit (from the past 240 hour(s)).    Radiology Studies: No results found.   Scheduled Meds: . aspirin  81 mg Oral Daily  . cycloSPORINE  1 drop Both Eyes BID  . gabapentin  300 mg Oral QPM  . isosorbide mononitrate  15 mg Oral Daily  . lanthanum  1,000 mg Oral TID WC  . midodrine  2.5 mg Oral BID WC  . pantoprazole  40 mg Oral BID  . polyethylene glycol  17 g Oral Daily  . rOPINIRole  6 mg Oral QHS  . sodium chloride flush  3  mL Intravenous Q12H   Continuous Infusions: . sodium chloride       Pamella Pertostin Gherghe, MD, PhD [facility] Pager [phone]  If 7PM-7AM, please contact night-coverage [email or web address] Password [email or web address] 01/31/2016, 12:51 PM

## 2016-01-31 NOTE — Interval H&P Note (Signed)
History and Physical Interval Note:  01/31/2016 3:38 PM  Nathaniel Steele  has presented today for cardiac catheterization, with the diagnosis of NSTEMI and syncope. The various methods of treatment have been discussed with the patient and family. After consideration of risks, benefits and other options for treatment, the patient has consented to  Procedure(s): Left Heart Cath and Cors/Grafts Angiography (N/A) as a surgical intervention .  The patient's history has been reviewed, patient examined, no change in status, stable for surgery.  I have reviewed the patient's chart and labs.  Questions were answered to the patient's satisfaction.    Cath Lab Visit (complete for each Cath Lab visit)  Clinical Evaluation Leading to the Procedure:   ACS: Yes.    Non-ACS:  N/A   [name]

## 2016-01-31 NOTE — H&P (View-Only) (Signed)
Patient Name: Nathaniel Steele Date of Encounter: 01/31/2016  Primary Cardiologist: Dr. Karl Luke Problem List     Active Problems:   End-stage renal disease (ESRD) (HCC)   Permanent atrial fibrillation (HCC)   Acute on chronic diastolic CHF (congestive heart failure), NYHA class 3 (HCC)   Anemia   Chest pain with moderate risk of acute coronary syndrome   Syncope     Subjective   Feels well, denies chest pain and SOB.   Inpatient Medications    Scheduled Meds: . aspirin  81 mg Oral Daily  . cycloSPORINE  1 drop Both Eyes BID  . gabapentin  300 mg Oral QPM  . isosorbide mononitrate  15 mg Oral Daily  . lanthanum  1,000 mg Oral TID WC  . midodrine  2.5 mg Oral BID WC  . pantoprazole  40 mg Oral BID  . polyethylene glycol  17 g Oral Daily  . rOPINIRole  6 mg Oral QHS  . sodium chloride flush  3 mL Intravenous Q12H   Continuous Infusions: . sodium chloride     PRN Meds: sodium chloride, acetaminophen **OR** acetaminophen, nitroGLYCERIN, sodium chloride flush, traZODone   Vital Signs    Vitals:   01/30/16 2030 01/30/16 2055 01/31/16 0220 01/31/16 0451  BP: 123/64 111/65 (!) 112/59 106/64  Pulse: 72  71 74  Resp:  20 20 20   Temp:  98.7 F (37.1 C)    TempSrc:  Oral Oral Oral  SpO2:  94% 93% 92%  Weight:  154 lb 5.2 oz (70 kg)  153 lb 11.2 oz (69.7 kg)  Height:        Intake/Output Summary (Last 24 hours) at 01/31/16 1118 Last data filed at 01/31/16 0600  Gross per 24 hour  Intake              480 ml  Output             2089 ml  Net            -1609 ml   Filed Weights   01/30/16 1725 01/30/16 2055 01/31/16 0451  Weight: 158 lb 4.6 oz (71.8 kg) 154 lb 5.2 oz (70 kg) 153 lb 11.2 oz (69.7 kg)    Physical Exam   WUJ:WJXBJYN malein no acute distress.  HEENT:Grossly normal.  Neck:Supple, no JVD, carotid bruits, or masses. Cardiac:RRR, 2/6 systolic Murmur. Norubs, or gallops. No clubbing, cyanosis, edema. Radials/DP/PT 2+ and equal  bilaterally.  Respiratory:Respirations regular and unlabored, clear to auscultation bilaterally. WG:NFAO, nontender, nondistended, BS + x 4. MS:no deformity or atrophy. Skin:warm and dry, no rash. Neuro:Strength and sensation are intact. Psych:AAOx3. Normal affect  Labs    CBC  Recent Labs  01/29/16 1020 01/31/16 0405  WBC 4.2 3.4*  HGB 8.6* 8.4*  HCT 25.6* 24.9*  MCV 94.1 95.0  PLT 79* 88*   Basic Metabolic Panel  Recent Labs  01/31/16 0405  NA 139  K 3.5  CL 98*  CO2 29  GLUCOSE 102*  BUN 26*  CREATININE 2.93*  CALCIUM 9.1     Telemetry    V paced - Personally Reviewed    Radiology    No results found.  Cardiac Studies   Study Conclusions  - Left ventricle: The cavity size was normal. There was moderate   concentric hypertrophy. Systolic function was normal. The   estimated ejection fraction was in the range of 55% to 60%. There   was a reduced contribution of atrial contraction to  ventricular   filling, due to increased ventricular diastolic pressure or   atrial contractile dysfunction. Doppler parameters are consistent   with a reversible restrictive pattern, indicative of decreased   left ventricular diastolic compliance and/or increased left   atrial pressure (grade 3 diastolic dysfunction). Doppler   parameters are consistent with high ventricular filling pressure. - Aortic valve: The AV bioprosthesis is poorly seen and leaflet   structure cannot be accurately assessed. The leaflet excursion   appears reduced . There is severe AS by calculated AVA of 0.78cm2   and moderate to severe by mean AV gradient of 37mmHg. Consider   TEE for further evaluation of the AV. Poorly visualized. A   bioprosthesis was present. Valve mobility was restricted. There   was mild regurgitation. Valve area (VTI): 0.77 cm^2. Valve area   (Vmax): 0.78 cm^2. Valve area (Vmean): 0.79 cm^2. - Aorta: Ascending aortic diameter: 42 mm (S). - Ascending aorta:  The ascending aorta was mildly dilated. - Mitral valve: Severely calcified annulus. Severe diffuse   thickening and calcification of the anterior leaflet and   posterior leaflet, with moderate involvement of chords.   Calcification. There was mild regurgitation. - Left atrium: The atrium was massively dilated. - Right atrium: The atrium was moderately dilated. - Pulmonic valve: There was trivial regurgitation. - Pulmonary arteries: PA peak pressure: 55 mm Hg (S). - Impressions: The right ventricular systolic pressure was   increased consistent with moderate pulmonary hypertension. - Recommendations: Consider TEE for further evaluation of the AV.  Impressions:  - The right ventricular systolic pressure was increased consistent   with moderate pulmonary hypertension.   Patient Profile     Mr. 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 He presented with syncope and chest discomfort.   Assessment & Plan    1. NSTEMI: Presents with syncope, and elevated troponin. He was admitted from 01/11/16-01/13/16 with chest pain and had nuclear study, no ischemia on nuc. EF was read at 15% but Echo confirmed normal EF. He had an episode of NSVT that admission, for which his beta blocker was increased. It was felt his pain was pericarditis for which colchicine was given. He declined heart cath last admission but saw Theodore DemarkRhonda Barrett in our office and agreed to cath which was scheduled for 01/31/16. For heart cath today. GI ok for cath and PCI, platelets chronically low.   2. Permanent Atrial fib: rate controlled, no anticoagulation with history of GI bleed.   3. AS s/p bioprosthetic AVR  4. ASCAD s/p remote CABG with LIMA to LAD and SVG to RCA.  Continue ASA.   5. Hyperlipidemia - currently not on statin therapy due to intolerance. Consider PCSK 9 inhibitor as outpt.   6. Chronic diastolic CHF - chest xray shows interstitial edema. Volume management per nephrology  7. ESRD on HD- per nephrology  8. SSS s/p PPM  Signed, Nathaniel IshikawaErin E Smith, NP  01/31/2016, 11:18 AM   I have personally seen and examined this patient with Nathaniel RighterErin Smith, NP. I agree with the assessment and plan as outlined above. He is admitted with chest pain, elevated troponin. Plans for cardiac cath today. GI recs to proceed with anti-coagulation/anti-platelet therapy if needed.   Nathaniel Steele 01/31/2016 11:32 AM

## 2016-01-31 NOTE — Care Management Important Message (Signed)
Important Message  Patient Details  Name: Nathaniel Steele MRN: [id number] Date of Birth: 04/14/1926   Medicare Important Message Given:  Yes    Drexler Maland Stefan ChurchBratton 01/31/2016, 12:18 PM

## 2016-01-31 NOTE — Progress Notes (Addendum)
Site area: right groin fa sheath Site Prior to Removal:  Level 0 Pressure Applied For:  20 minutes Manual:   yes Patient Status During Pull:  stable Post Pull Site:  Level  0 Post Pull Instructions Given:  yes Post Pull Pulses Present: yes Dressing Applied:  yes Bedrest begins @ 1705 Comments: IV saline locked

## 2016-01-31 NOTE — Progress Notes (Signed)
Patient ID: Nathaniel Steele, male   DOB: 12/16/1926, 83 y.o.   MRN: [id number] [facility] Progress Note   Assessment/ Plan:   1. Syncopal event/chest pain: With evidence of anemia/GI blood loss and NSTEMI- Cleared by gastroenterology for anticoagulation based on negative enteroscopy. Left heart catheterization today 2. End-stage renal disease: Hemodialysis will be ordered for tomorrow to continue Monday/Wednesday/Friday schedule-plan to do early in the day for possible discharge later tomorrow. 3. Anemia:  negative enteroscopy with hemoglobin stable status post PRBC transfusion. 4. Hypotension:  will monitor closely with ultrafiltration at dialysis to limit orthostatic drops. 5. Secondary hyperparathyroidism: Receive a phosphorus binder/VDRA with dialysis. 6. History of coronary artery disease status post CABG, permanent atrial fibrillation, chronic diastolic heart failure: Not on anticoagulation because of history of GI bleed, scheduled for coronary angiogram on 1/11.  Subjective:   Reports to be feeling well without any chest pain and had some insomnia overnight.    Objective:   BP 106/64 (BP Location: Right Arm)   Pulse 74   Temp 98.7 F (37.1 C) (Oral)   Resp 20   Ht 5' 6.5" (1.689 m)   Wt 69.7 kg (153 lb 11.2 oz) Comment: scale c  SpO2 92%   BMI 24.44 kg/m   Physical Exam: WUJ:WJXBJYNWGNFGen:Comfortably resting in bed, wife and friend at bedside CVS: Pulse irregularly irregular, normal rate Resp: Clear to auscultation, no rales Abd: Soft, obese, nontender Ext: No lower extremity edema, left radiocephalic fistula  Labs: BMET  Recent Labs Lab 01/27/16 1051 01/27/16 1411 01/28/16 0029 01/31/16 0405  NA 140  --  139 139  K 3.6  --  4.1 3.5  CL 101  --  98* 98*  CO2 26  --  25 29  GLUCOSE 117*  --  98 102*  BUN 68*  --  79* 26*  CREATININE 5.84*  --  6.29* 2.93*  CALCIUM 9.1  --  8.8* 9.1  PHOS  --  7.9*  --   --    CBC  Recent Labs Lab 01/27/16 1051  01/27/16 2153 01/28/16 0029 01/29/16 1020 01/31/16 0405  WBC 4.0  --  4.3 4.2 3.4*  HGB 7.6* 7.8* 7.6* 8.6* 8.4*  HCT 22.7* 23.4* 22.8* 25.6* 24.9*  MCV 95.4  --  94.2 94.1 95.0  PLT 77*  --  73* 79* 88*   Medications:    . aspirin  81 mg Oral Daily  . cycloSPORINE  1 drop Both Eyes BID  . gabapentin  300 mg Oral QPM  . isosorbide mononitrate  15 mg Oral Daily  . lanthanum  1,000 mg Oral TID WC  . midodrine  2.5 mg Oral BID WC  . pantoprazole  40 mg Oral BID  . polyethylene glycol  17 g Oral Daily  . rOPINIRole  6 mg Oral QHS  . sodium chloride flush  3 mL Intravenous Q12H   Zetta BillsJay Pattricia Weiher, MD 01/31/2016, 10:21 AM

## 2016-01-31 NOTE — Progress Notes (Signed)
Patient Name: Nathaniel Steele Date of Encounter: 01/31/2016  Primary Cardiologist: Dr. Karl Luke Problem List     Active Problems:   End-stage renal disease (ESRD) (HCC)   Permanent atrial fibrillation (HCC)   Acute on chronic diastolic CHF (congestive heart failure), NYHA class 3 (HCC)   Anemia   Chest pain with moderate risk of acute coronary syndrome   Syncope     Subjective   Feels well, denies chest pain and SOB.   Inpatient Medications    Scheduled Meds: . aspirin  81 mg Oral Daily  . cycloSPORINE  1 drop Both Eyes BID  . gabapentin  300 mg Oral QPM  . isosorbide mononitrate  15 mg Oral Daily  . lanthanum  1,000 mg Oral TID WC  . midodrine  2.5 mg Oral BID WC  . pantoprazole  40 mg Oral BID  . polyethylene glycol  17 g Oral Daily  . rOPINIRole  6 mg Oral QHS  . sodium chloride flush  3 mL Intravenous Q12H   Continuous Infusions: . sodium chloride     PRN Meds: sodium chloride, acetaminophen **OR** acetaminophen, nitroGLYCERIN, sodium chloride flush, traZODone   Vital Signs    Vitals:   01/30/16 2030 01/30/16 2055 01/31/16 0220 01/31/16 0451  BP: 123/64 111/65 (!) 112/59 106/64  Pulse: 72  71 74  Resp:  20 20 20   Temp:  98.7 F (37.1 C)    TempSrc:  Oral Oral Oral  SpO2:  94% 93% 92%  Weight:  154 lb 5.2 oz (70 kg)  153 lb 11.2 oz (69.7 kg)  Height:        Intake/Output Summary (Last 24 hours) at 01/31/16 1118 Last data filed at 01/31/16 0600  Gross per 24 hour  Intake              480 ml  Output             2089 ml  Net            -1609 ml   Filed Weights   01/30/16 1725 01/30/16 2055 01/31/16 0451  Weight: 158 lb 4.6 oz (71.8 kg) 154 lb 5.2 oz (70 kg) 153 lb 11.2 oz (69.7 kg)    Physical Exam   WUJ:WJXBJYN malein no acute distress.  HEENT:Grossly normal.  Neck:Supple, no JVD, carotid bruits, or masses. Cardiac:RRR, 2/6 systolic Murmur. Norubs, or gallops. No clubbing, cyanosis, edema. Radials/DP/PT 2+ and equal  bilaterally.  Respiratory:Respirations regular and unlabored, clear to auscultation bilaterally. WG:NFAO, nontender, nondistended, BS + x 4. MS:no deformity or atrophy. Skin:warm and dry, no rash. Neuro:Strength and sensation are intact. Psych:AAOx3. Normal affect  Labs    CBC  Recent Labs  01/29/16 1020 01/31/16 0405  WBC 4.2 3.4*  HGB 8.6* 8.4*  HCT 25.6* 24.9*  MCV 94.1 95.0  PLT 79* 88*   Basic Metabolic Panel  Recent Labs  01/31/16 0405  NA 139  K 3.5  CL 98*  CO2 29  GLUCOSE 102*  BUN 26*  CREATININE 2.93*  CALCIUM 9.1     Telemetry    V paced - Personally Reviewed    Radiology    No results found.  Cardiac Studies   Study Conclusions  - Left ventricle: The cavity size was normal. There was moderate   concentric hypertrophy. Systolic function was normal. The   estimated ejection fraction was in the range of 55% to 60%. There   was a reduced contribution of atrial contraction to  ventricular   filling, due to increased ventricular diastolic pressure or   atrial contractile dysfunction. Doppler parameters are consistent   with a reversible restrictive pattern, indicative of decreased   left ventricular diastolic compliance and/or increased left   atrial pressure (grade 3 diastolic dysfunction). Doppler   parameters are consistent with high ventricular filling pressure. - Aortic valve: The AV bioprosthesis is poorly seen and leaflet   structure cannot be accurately assessed. The leaflet excursion   appears reduced . There is severe AS by calculated AVA of 0.78cm2   and moderate to severe by mean AV gradient of 37mmHg. Consider   TEE for further evaluation of the AV. Poorly visualized. A   bioprosthesis was present. Valve mobility was restricted. There   was mild regurgitation. Valve area (VTI): 0.77 cm^2. Valve area   (Vmax): 0.78 cm^2. Valve area (Vmean): 0.79 cm^2. - Aorta: Ascending aortic diameter: 42 mm (S). - Ascending aorta:  The ascending aorta was mildly dilated. - Mitral valve: Severely calcified annulus. Severe diffuse   thickening and calcification of the anterior leaflet and   posterior leaflet, with moderate involvement of chords.   Calcification. There was mild regurgitation. - Left atrium: The atrium was massively dilated. - Right atrium: The atrium was moderately dilated. - Pulmonic valve: There was trivial regurgitation. - Pulmonary arteries: PA peak pressure: 55 mm Hg (S). - Impressions: The right ventricular systolic pressure was   increased consistent with moderate pulmonary hypertension. - Recommendations: Consider TEE for further evaluation of the AV.  Impressions:  - The right ventricular systolic pressure was increased consistent   with moderate pulmonary hypertension.   Patient Profile     Mr. 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 He presented with syncope and chest discomfort.   Assessment & Plan    1. NSTEMI: Presents with syncope, and elevated troponin. He was admitted from 01/11/16-01/13/16 with chest pain and had nuclear study, no ischemia on nuc. EF was read at 15% but Echo confirmed normal EF. He had an episode of NSVT that admission, for which his beta blocker was increased. It was felt his pain was pericarditis for which colchicine was given. He declined heart cath last admission but saw Theodore DemarkRhonda Barrett in our office and agreed to cath which was scheduled for 01/31/16. For heart cath today. GI ok for cath and PCI, platelets chronically low.   2. Permanent Atrial fib: rate controlled, no anticoagulation with history of GI bleed.   3. AS s/p bioprosthetic AVR  4. ASCAD s/p remote CABG with LIMA to LAD and SVG to RCA.  Continue ASA.   5. Hyperlipidemia - currently not on statin therapy due to intolerance. Consider PCSK 9 inhibitor as outpt.   6. Chronic diastolic CHF - chest xray shows interstitial edema. Volume management per nephrology  7. ESRD on HD- per nephrology  8. SSS s/p PPM  Signed, Nathaniel IshikawaErin E Smith, NP  01/31/2016, 11:18 AM   I have personally seen and examined this patient with Nathaniel RighterErin Smith, NP. I agree with the assessment and plan as outlined above. He is admitted with chest pain, elevated troponin. Plans for cardiac cath today. GI recs to proceed with anti-coagulation/anti-platelet therapy if needed.   Nathaniel Steele 01/31/2016 11:32 AM

## 2016-01-31 NOTE — Progress Notes (Signed)
Informed Candy SledgeE. Smith, NP that pt is NPO and for cardiac cath today if he can take his med.  Instructed to make pt NPO except sips with meds.  Amanda PeaNellie Ameia Morency, Charity fundraiserN.

## 2016-01-31 NOTE — Progress Notes (Signed)
PT Cancellation Note  Patient Details Name: Nathaniel Steele MRN: [id number] DOB: 12/09/1926   Cancelled Treatment:    Reason Eval/Treat Not Completed: Medical issues which prohibited therapy. Pt scheduled for cath lab today. PT will re-attempt tomorrow if pt is available and appropriate.    Alessandra BevelsJennifer M Ovetta Bazzano 01/31/2016, 3:10 PM

## 2016-02-01 ENCOUNTER — Encounter (HOSPITAL_COMMUNITY): Payer: Self-pay | Admitting: Internal Medicine

## 2016-02-01 DIAGNOSIS — I350 Nonrheumatic aortic (valve) stenosis: Secondary | ICD-10-CM

## 2016-02-01 DIAGNOSIS — I25119 Atherosclerotic heart disease of native coronary artery with unspecified angina pectoris: Secondary | ICD-10-CM

## 2016-02-01 LAB — CBC
HCT: 25.5 % — ABNORMAL LOW (ref 39.0–52.0)
Hemoglobin: 8.3 g/dL — ABNORMAL LOW (ref 13.0–17.0)
MCH: 30.7 pg (ref 26.0–34.0)
MCHC: 32.5 g/dL (ref 30.0–36.0)
MCV: 94.4 fL (ref 78.0–100.0)
Platelets: 109 10*3/uL — ABNORMAL LOW (ref 150–400)
RBC: 2.7 MIL/uL — ABNORMAL LOW (ref 4.22–5.81)
RDW: 15.3 % (ref 11.5–15.5)
WBC: 2.9 10*3/uL — AB (ref 4.0–10.5)

## 2016-02-01 LAB — BASIC METABOLIC PANEL
Anion gap: 11 (ref 5–15)
BUN: 41 mg/dL — AB (ref 6–20)
CO2: 28 mmol/L (ref 22–32)
CREATININE: 4.36 mg/dL — AB (ref 0.61–1.24)
Calcium: 9.2 mg/dL (ref 8.9–10.3)
Chloride: 99 mmol/L — ABNORMAL LOW (ref 101–111)
GFR calc Af Amer: 13 mL/min — ABNORMAL LOW (ref >60)
GFR, EST NON AFRICAN AMERICAN: 11 mL/min — AB (ref >60)
GLUCOSE: 91 mg/dL (ref 65–99)
Potassium: 3.9 mmol/L (ref 3.5–5.1)
SODIUM: 138 mmol/L (ref 135–145)

## 2016-02-01 MED ORDER — MIDODRINE HCL 5 MG PO TABS
ORAL_TABLET | ORAL | Status: AC
  Filled 2016-02-01: qty 1

## 2016-02-01 MED ORDER — ISOSORBIDE MONONITRATE ER 30 MG PO TB24: 15.0000 mg | ORAL_TABLET | Freq: Every day | ORAL | 1 refills | Status: AC

## 2016-02-01 NOTE — Progress Notes (Signed)
Patient Name: Nathaniel Steele Date of Encounter: 02/01/2016  Primary Cardiologist: Dr. Karl Steele  Hospital Problem List     Active Problems:   End-stage renal disease (ESRD) (HCC)   Permanent atrial fibrillation (HCC)   Acute on chronic diastolic CHF (congestive heart failure), NYHA class 3 (HCC)   Anemia   Chest pain with moderate risk of acute coronary syndrome   Syncope and collapse   Elevated troponin     Subjective   Feels well, denies chest pain and SOB.   Inpatient Medications    Scheduled Meds: . aspirin  81 mg Oral Daily  . cycloSPORINE  1 drop Both Eyes BID  . gabapentin  300 mg Oral QPM  . isosorbide mononitrate  15 mg Oral Daily  . lanthanum  1,000 mg Oral TID WC  . midodrine  2.5 mg Oral BID WC  . pantoprazole  40 mg Oral BID  . polyethylene glycol  17 g Oral Daily  . rOPINIRole  6 mg Oral QHS  . sodium chloride flush  3 mL Intravenous Q12H  . sodium chloride flush  3 mL Intravenous Q12H   Continuous Infusions:  PRN Meds: sodium chloride, sodium chloride, sodium chloride, sodium chloride, acetaminophen **OR** acetaminophen, alteplase, heparin, heparin, heparin, lidocaine (PF), lidocaine-prilocaine, nitroGLYCERIN, pentafluoroprop-tetrafluoroeth, sodium chloride flush, sodium chloride flush, traZODone   Vital Signs    Vitals:   02/01/16 1000 02/01/16 1015 02/01/16 1030 02/01/16 1033  BP: 109/63 (!) 109/58 (!) 111/55 108/62  Pulse: 72 70 72 75  Resp:    19  Temp:    98.6 F (37 C)  TempSrc:    Oral  SpO2:    95%  Weight:    153 lb (69.4 kg)  Height:        Intake/Output Summary (Last 24 hours) at 02/01/16 1109 Last data filed at 02/01/16 1033  Gross per 24 hour  Intake              480 ml  Output             1600 ml  Net            -1120 ml   Filed Weights   02/01/16 0500 02/01/16 0655 02/01/16 1033  Weight: 153 lb 3.2 oz (69.5 kg) 156 lb 15.5 oz (71.2 kg) 153 lb (69.4 kg)    Physical Exam   RUE:AVWUJWJGEN:Elderly malein no acute distress.    HEENT:Grossly normal.  Neck:Supple, no JVD, carotid bruits, or masses. Cardiac:RRR, 2/6 systolic Murmur. Norubs, or gallops. No clubbing, cyanosis, edema. Radials/DP/PT 2+ and equal bilaterally.  Respiratory:Respirations regular and unlabored, clear to auscultation bilaterally. XB:JYNWGI:Soft, nontender, nondistended, BS + x 4. MS:no deformity or atrophy. Skin:warm and dry, no rash. Neuro:Strength and sensation are intact. Psych:AAOx3. Normal affect  Labs    CBC  Recent Labs  01/31/16 1803 02/01/16 0533  WBC 3.1* 2.9*  HGB 8.3* 8.3*  HCT 25.7* 25.5*  MCV 94.5 94.4  PLT 107* 109*   Basic Metabolic Panel  Recent Labs  01/31/16 1803 02/01/16 0533  NA 137 138  K 3.9 3.9  CL 98* 99*  CO2 29 28  GLUCOSE 88 91  BUN 37* 41*  CREATININE 3.73* 4.36*  CALCIUM 9.1 9.2  PHOS 5.9*  --      Telemetry    V paced - Personally Reviewed   Radiology    No results found.  Cardiac Studies   Study Conclusions  - Left ventricle: The cavity size was normal. There  was moderate   concentric hypertrophy. Systolic function was normal. The   estimated ejection fraction was in the range of 55% to 60%. There   was a reduced contribution of atrial contraction to ventricular   filling, due to increased ventricular diastolic pressure or   atrial contractile dysfunction. Doppler parameters are consistent   with a reversible restrictive pattern, indicative of decreased   left ventricular diastolic compliance and/or increased left   atrial pressure (grade 3 diastolic dysfunction). Doppler   parameters are consistent with high ventricular filling pressure. - Aortic valve: The AV bioprosthesis is poorly seen and leaflet   structure cannot be accurately assessed. The leaflet excursion   appears reduced . There is severe AS by calculated AVA of 0.78cm2   and moderate to severe by mean AV gradient of . Consider   TEE for further evaluation of the AV. Poorly visualized. A    bioprosthesis was present. Valve mobility was restricted. There   was mild regurgitation. Valve area (VTI): 0.77 cm^2. Valve area   (Vmax): 0.78 cm^2. Valve area (Vmean): 0.79 cm^2. - Aorta: Ascending aortic diameter: 42 mm (S). - Ascending aorta: The ascending aorta was mildly dilated. - Mitral valve: Severely calcified annulus. Severe diffuse   thickening and calcification of the anterior leaflet and   posterior leaflet, with moderate involvement of chords.   Calcification. There was mild regurgitation. - Left atrium: The atrium was massively dilated. - Right atrium: The atrium was moderately dilated. - Pulmonic valve: There was trivial regurgitation. - Pulmonary arteries: PA peak pressure: 55 mm Hg (S). - Impressions: The right ventricular systolic pressure was   increased consistent with moderate pulmonary hypertension. - Recommendations: Consider TEE for further evaluation of the AV.  Impressions:  - The right ventricular systolic pressure was increased consistent   with moderate pulmonary hypertension.   Patient Profile     Nathaniel Steele is a 83 year old male with a past medical history of chronic diastolic CHF, ESRD on HD, permanent Afib (not on anticoagulation due to GI bleeding history), high grade AV block s/p PPM (dual chamber now programmed VVIR), CAD s/p remote CABG, aortic valve replacement with bio prosthesis. He presented with syncope and chest discomfort.   Assessment & Plan    1. CAD/Elevated troponin: Pt admitted with syncope and elevated troponin. He was admitted from 01/11/16-01/13/16 with chest pain and had nuclear study, no ischemia on nuclear stress test then.  Echo 01/29/16 with normal LVEF. Cardiac cath 01/31/16 with stable native vessel CAD with 2 patent bypass grafts. Continue medical management of CAD.  2. Permanent Atrial fib: rate controlled, no anticoagulation with history of GI bleed.   3. Aortic stenosis, s/p bioprosthetic AVR with moderate stenosis  of the bioprosthetic aortic valve: I suspect that some of his symptoms could be related the degeneration of his aortic valve replacement. I have discussed this with his family. He would not be a candidate for surgical redo AVR. He could be considered for TAVR with valve in valve procedure if this is felt to be appropriate. He has had a swift decline in his overall functional ability over the last 6 months. I will discuss with Dr. Rubie Maid.   4. Hyperlipidemia - currently not on statin therapy due to intolerance. Consider PCSK 9 inhibitor as outpt.   5. ESRD on HD- per nephrology   6. SSS s/p PPM  OK to d/c home today from cardiac standpoint. He will need close f/u with Dr. Rubie Maid.   Signed, Cristal Deer  Clifton James, MD  02/01/2016, 11:09 AM

## 2016-02-01 NOTE — Discharge Summary (Signed)
Physician Discharge Summary  Nathaniel Guysaul C Guilford ZOX:[id number] DOB: 03/19/1926 DOA: 01/27/2016  PCP: Delorse LekBURNETT,BRENT A, MD  Admit date: 01/27/2016 Discharge date: 02/01/2016  Admitted From: home Disposition:  home  Recommendations for Outpatient Follow-up:  1. Follow up with PCP in 1-2 weeks 2. Follow up with Dr. Royann Shiversroitoru as scheduled  Home Health: none Equipment/Devices: none  Discharge Condition: stable CODE STATUS: Full Diet recommendation: heart healthy  HPI: Per Dr. Melynda RippleHobbs,  Nathaniel Steele is a 83 y.o. male  with past history significant for congestive heart failure, chronic kidney disease, coronary disease, lupus, pacemaker, restless leg who presents to the emergency room chief complaint of syncope. Patient states that since his last hospital admission his legs have been weak. Patient states he can only walk short distances. Patient states yesterday he had some chest discomfort and took a nitroglycerin with relief. Patient states the same happened twice today and with one nitroglycerin this chest pain was resolved. The second episode happened just prior to the patient getting ready to leave the house. On the way to the car his legs became weaker than normal patient made it inside the car and then became unresponsive having passed out for a few minutes. EMS came and patient had already regained consciousness. Patient was brought to the hospital. Patient denies fever, chills, nausea, vomiting, shortness of breath, dizziness, weakness in upper extremities. History gathered from patient and wife. ED course: EDP started heparin for ACS. Cardiology consulted who advised stopping heparin due to the patient's history of GI bleed. Medical management at this time advised. Hospitalist was consulted for admission. Patient did arrive on 1 L of oxygen due to some mild hypoxia.  Hospital Course: Discharge Diagnoses:  Active Problems:   End-stage renal disease (ESRD) (HCC)   Permanent atrial fibrillation  (HCC)   Acute on chronic diastolic CHF (congestive heart failure), NYHA class 3 (HCC)   Anemia   Chest pain with moderate risk of acute coronary syndrome   Syncope and collapse   Elevated troponin   Coronary artery disease involving native coronary artery of native heart with angina pectoris (HCC)   Aortic valve stenosis  NSTEMI - patient with chest pain and troponin elevation. He underwent a cardiac catheterization on 1/11 which showed significant native coronary artery disease and widely patent LIMA->LAD and SVG->RCA. Recommendations were for medical management. Syncope - Suspect related to anemia, vs cardiac arrhythmia. Cardiology consulted and have followed patient while hospitalized.  Acute on chronic Diastolic HF - Fluid management per nephrology with hemodialysis, he appears euvolemic on exam today Anemia; Acute on chronic blood loss in setting of GI bleed - concern for GI bleed, gastroenterology was consulted and have evaluated patient, he underwent an upper endoscopy on 1/9 per Dr. Elnoria HowardHung, appreciate input, no clear source for GI bleeding RLS, chronic - Continue Requip Chronic pain, stable - continue Neurontin Orthostatic hypotension - Continue Midodrine.  End-stage renal disease - Nephrology consulted, on HD Thrombocytopenia - Chronic, stable, outpatient workup   Discharge Instructions   Allergies as of 02/01/2016      Reactions   Rosuvastatin Calcium Other (See Comments)   myalgias   Statins Other (See Comments)   myalgias   Aspirin Nausea Only   Patient states that he can take the enteric coated but not the uncoated   Feraheme [ferumoxytol] Rash, Other (See Comments)   Abdominal pain   Penicillins Hives      Medication List    TAKE these medications   aspirin 81 MG EC  tablet Take 1 tablet (81 mg total) by mouth daily.   cycloSPORINE 0.05 % ophthalmic emulsion Commonly known as:  RESTASIS Place 1 drop into both eyes 2 (two) times daily.   fish oil-omega-3 fatty  acids 1000 MG capsule Take 1 g by mouth daily.   gabapentin 300 MG capsule Commonly known as:  NEURONTIN Take 300 mg by mouth every evening.   isosorbide mononitrate 30 MG 24 hr tablet Commonly known as:  IMDUR Take 0.5 tablets (15 mg total) by mouth daily. Start taking on:  02/02/2016   lanthanum 1000 MG chewable tablet Commonly known as:  FOSRENOL Chew 1,000 mg by mouth 3 (three) times daily with meals.   midodrine 2.5 MG tablet Commonly known as:  PROAMATINE Take 2.5 mg by mouth 2 (two) times daily with a meal. SECOND DOSE 6 HOURS LATER   nitroGLYCERIN 0.4 MG SL tablet Commonly known as:  NITROSTAT Place 1 tablet (0.4 mg total) under the tongue every 5 (five) minutes x 3 doses as needed for chest pain. What changed:  Another medication with the same name was removed. Continue taking this medication, and follow the directions you see here.   OCUVITE PRESERVISION PO Take 1 tablet by mouth daily.   pantoprazole 40 MG tablet Commonly known as:  PROTONIX Take 1 tablet (40 mg total) by mouth 2 (two) times daily.   rOPINIRole 2 MG tablet Commonly known as:  REQUIP Take 6 mg by mouth at bedtime.   VITAMIN B 12 PO Take 1 tablet by mouth daily.      Follow-up Information    [facility] Follow up.   Specialty:  Home Health Services Why:  They will do your home health care at your home Contact information: [address] [place] [address] [place] [address] [phone]        Thurmon Fair, MD. Schedule an appointment as soon as possible for a visit in 2 week(s).   Specialty:  Cardiology Contact information: [address] [place] [address] [phone]          Allergies  Allergen Reactions  . Rosuvastatin Calcium Other (See Comments)    myalgias  . Statins Other (See Comments)    myalgias  . Aspirin Nausea Only    Patient states that he can take the enteric coated but not the uncoated  . Feraheme [Ferumoxytol] Rash and Other (See  Comments)    Abdominal pain  . Penicillins Hives    Consultations:  Cardiology   GI  Procedures/Studies:  EGD Impression:       - Normal esophagus.                           - Normal stomach.                           - Normal examined duodenum.                           - The examined portion of the jejunum was normal.                           - No specimens collected.   Cardiac catheterization  1. Significant native coronary artery disease, including 70% mid LAD stenosis followed by function occlusion of midvessel, 90% ostial stenosis of small ramus intermedius, 30%  mid LCx stenosis, and sequential 90% and 70% proximal and mid RCA lesions. 2. Widely patent LIMA->LAD and SVG->RCA.  Dg Chest 2 View  Result Date: 01/11/2016 CLINICAL DATA:  Chest pain with lower extremity weakness EXAM: CHEST  2 VIEW COMPARISON:  January 08, 2016 FINDINGS: There is no appreciable edema or consolidation. Heart is enlarged with pulmonary vascular within normal limits. Pacemaker leads are attached to the right atrium and right ventricle. There is atherosclerotic calcification aorta. No adenopathy. Patient is status post coronary artery bypass grafting and aortic valve replacement. Bones are osteoporotic. There is anterior wedging of the L1 vertebral body. IMPRESSION: Stable cardiomegaly. Aortic atherosclerosis. No edema or consolidation. Bones osteoporotic. Electronically Signed   By: Bretta Bang III M.D.   On: 01/11/2016 11:22   Nm Myocar Multi W/spect W/wall Motion / Ef  Result Date: 01/12/2016 CLINICAL DATA:  Chest pain EXAM: MYOCARDIAL IMAGING WITH SPECT (REST AND PHARMACOLOGIC-STRESS) GATED LEFT VENTRICULAR WALL MOTION STUDY LEFT VENTRICULAR EJECTION FRACTION TECHNIQUE: Standard myocardial SPECT imaging was performed after resting intravenous injection of 10 mCi Tc-87m tetrofosmin. Subsequently, intravenous infusion of Lexiscan was performed under the supervision of the Cardiology staff. At  peak effect of the drug, 30 mCi Tc-31m tetrofosmin was injected intravenously and standard myocardial SPECT imaging was performed. Quantitative gated imaging was also performed to evaluate left ventricular wall motion, and estimate left ventricular ejection fraction. COMPARISON:  None. FINDINGS: Perfusion: There is attenuation artifact just inferior to the apex. No definite perfusion defects are identified. No stress-induced ischemia. Wall Motion: Severe global hypokinesis Left Ventricular Ejection Fraction: 15 % End diastolic volume 125 ml End systolic volume 107 ml IMPRESSION: 1. No perfusion defects allowing for inferior apical attenuation artifact. 2. Global hypokinesis. 3. Left ventricular ejection fraction 15% 4. Non invasive risk stratification*: High risk. *2012 Appropriate Use Criteria for Coronary Revascularization Focused Update: J Am Coll Cardiol. 2012;59(9):857-881. http://content.dementiazones.com.aspx?articleid=[email or web address] Electronically Signed   By: Jolaine Click M.D.   On: 01/12/2016 11:42   Dg Chest Port 1 View  Result Date: 01/27/2016 CLINICAL DATA:  Chest tightness and syncope today. EXAM: PORTABLE CHEST 1 VIEW COMPARISON:  PA and lateral chest 01/11/2016 and 12/22/2013 FINDINGS: The patient is status post CABG with a pacing device in place. There is marked cardiomegaly and interstitial pulmonary edema. No consolidative process, pneumothorax or effusion. IMPRESSION: Cardiomegaly and interstitial pulmonary edema. Electronically Signed   By: Drusilla Kanner M.D.   On: 01/27/2016 11:37     Subjective: - no chest pain, shortness of breath, no abdominal pain, nausea or vomiting.   Discharge Exam: Vitals:   02/01/16 1033 02/01/16 1237  BP: 108/62 (!) 88/41  Pulse: 75 71  Resp: 19 18  Temp: 98.6 F (37 C) 98.8 F (37.1 C)   Vitals:   02/01/16 1015 02/01/16 1030 02/01/16 1033 02/01/16 1237  BP: (!) 109/58 (!) 111/55 108/62 (!) 88/41  Pulse: 70 72 75 71  Resp:   19 18  Temp:    98.6 F (37 C) 98.8 F (37.1 C)  TempSrc:   Oral Oral  SpO2:   95% 94%  Weight:   69.4 kg (153 lb)   Height:        General: Pt is alert, awake, not in acute distress Cardiovascular: RRR, S1/S2 +, no rubs, no gallops Respiratory: CTA bilaterally, no wheezing, no rhonchi Abdominal: Soft, NT, ND, bowel sounds + Extremities: no edema, no cyanosis   The results of significant diagnostics from this hospitalization (including imaging, microbiology, ancillary and laboratory) are  listed below for reference.     Microbiology: No results found for this or any previous visit (from the past 240 hour(s)).   Labs: BNP (last 3 results) No results for input(s): BNP in the last 8760 hours. Basic Metabolic Panel:  Recent Labs Lab 01/27/16 1051 01/27/16 1411 01/28/16 0029 01/31/16 0405 01/31/16 1803 02/01/16 0533  NA 140  --  139 139 137 138  K 3.6  --  4.1 3.5 3.9 3.9  CL 101  --  98* 98* 98* 99*  CO2 26  --  25 29 29 28   GLUCOSE 117*  --  98 102* 88 91  BUN 68*  --  79* 26* 37* 41*  CREATININE 5.84*  --  6.29* 2.93* 3.73* 4.36*  CALCIUM 9.1  --  8.8* 9.1 9.1 9.2  MG  --  2.3  --   --   --   --   PHOS  --  7.9*  --   --  5.9*  --    Liver Function Tests:  Recent Labs Lab 01/31/16 1803  ALBUMIN 2.9*   No results for input(s): LIPASE, AMYLASE in the last 168 hours. No results for input(s): AMMONIA in the last 168 hours. CBC:  Recent Labs Lab 01/28/16 0029 01/29/16 1020 01/31/16 0405 01/31/16 1803 02/01/16 0533  WBC 4.3 4.2 3.4* 3.1* 2.9*  HGB 7.6* 8.6* 8.4* 8.3* 8.3*  HCT 22.8* 25.6* 24.9* 25.7* 25.5*  MCV 94.2 94.1 95.0 94.5 94.4  PLT 73* 79* 88* 107* 109*   Cardiac Enzymes:  Recent Labs Lab 01/27/16 1358 01/27/16 1602 01/28/16 0029  TROPONINI 0.12* 0.14* 0.19*   BNP: Invalid input(s): POCBNP CBG:  Recent Labs Lab 01/27/16 1204  GLUCAP 115*   D-Dimer No results for input(s): DDIMER in the last 72 hours. Hgb A1c No results for input(s): HGBA1C in  the last 72 hours. Lipid Profile No results for input(s): CHOL, HDL, LDLCALC, TRIG, CHOLHDL, LDLDIRECT in the last 72 hours. Thyroid function studies No results for input(s): TSH, T4TOTAL, T3FREE, THYROIDAB in the last 72 hours.  Invalid input(s): FREET3 Anemia work up No results for input(s): VITAMINB12, FOLATE, FERRITIN, TIBC, IRON, RETICCTPCT in the last 72 hours. Urinalysis    Component Value Date/Time   COLORURINE YELLOW 01/28/2016 0306   APPEARANCEUR HAZY (A) 01/28/2016 0306   LABSPEC 1.013 01/28/2016 0306   PHURINE 7.0 01/28/2016 0306   GLUCOSEU NEGATIVE 01/28/2016 0306   HGBUR NEGATIVE 01/28/2016 0306   BILIRUBINUR NEGATIVE 01/28/2016 0306   KETONESUR NEGATIVE 01/28/2016 0306   PROTEINUR 100 (A) 01/28/2016 0306   UROBILINOGEN 0.2 03/17/2014 1753   NITRITE NEGATIVE 01/28/2016 0306   LEUKOCYTESUR LARGE (A) 01/28/2016 0306   Sepsis Labs Invalid input(s): PROCALCITONIN,  WBC,  LACTICIDVEN Microbiology No results found for this or any previous visit (from the past 240 hour(s)).   Time coordinating discharge: Over 30 minutes  SIGNED:  Pamella Pert, MD  [facility] 02/01/2016, 3:44 PM Pager [phone]  If 7PM-7AM, please contact night-coverage [email or web address] Password [email or web address]

## 2016-02-01 NOTE — Evaluation (Signed)
Physical Therapy Evaluation Patient Details Name: Nathaniel Steele MRN: [id number] DOB: 1926/06/28 Today's Date: 02/01/2016   History of Present Illness  Pt is an 83 y/o male admitted secondary to syncopal episode at home. PMH including but not limited to ESRD, A-fib, CHF, CAD s/p CABG in 2013, s/p AVR in 2013 and pacemaker placement in 2006.  Clinical Impression  Pt presented exiting bathroom in his room when PT arrived. Prior to admission, pt reported that he was independent with all functional mobility and ADLs. Pt currently at min guard for transfers and ambulation with one HHA for safety. Pt would continue to benefit from skilled physical therapy services at this time while admitted to address his below listed limitations in order to improve his overall safety and independence with functional mobility.      Follow Up Recommendations No PT follow up    Equipment Recommendations  None recommended by PT    Recommendations for Other Services       Precautions / Restrictions Precautions Precautions: None Restrictions Weight Bearing Restrictions: No      Mobility  Bed Mobility Overal bed mobility: Modified Independent             General bed mobility comments: increased time  Transfers Overall transfer level: Needs assistance Equipment used: None Transfers: Sit to/from Stand Sit to Stand: Min guard         General transfer comment: pt required increased time, min guard for safety  Ambulation/Gait Ambulation/Gait assistance: Min guard Ambulation Distance (Feet): 50 Feet Assistive device: 1 person hand held assist Gait Pattern/deviations: Step-through pattern;Decreased stride length;Trunk flexed Gait velocity: decreased   General Gait Details: pt very cautious, no instability or LOB  Stairs            Wheelchair Mobility    Modified Rankin (Stroke Patients Only)       Balance Overall balance assessment: Needs assistance Sitting-balance support: Feet  supported;No upper extremity supported Sitting balance-Leahy Scale: Good     Standing balance support: During functional activity;No upper extremity supported Standing balance-Leahy Scale: Fair                               Pertinent Vitals/Pain Pain Assessment: No/denies pain    Home Living Family/patient expects to be discharged to:: Private residence Living Arrangements: Spouse/significant other Available Help at Discharge: Family;Available 24 hours/day Type of Home: House Home Access: Level entry     Home Layout: Two level;Able to live on main level with bedroom/bathroom Home Equipment: Gilmer Mor - single point;Walker - 2 wheels      Prior Function Level of Independence: Independent               Hand Dominance        Extremity/Trunk Assessment   Upper Extremity Assessment Upper Extremity Assessment: Overall WFL for tasks assessed    Lower Extremity Assessment Lower Extremity Assessment: Generalized weakness       Communication   Communication: No difficulties  Cognition Arousal/Alertness: Awake/alert Behavior During Therapy: WFL for tasks assessed/performed Overall Cognitive Status: Within Functional Limits for tasks assessed                      General Comments      Exercises     Assessment/Plan    PT Assessment Patient needs continued PT services  PT Problem List Decreased balance;Decreased mobility;Decreased coordination;Decreased knowledge of use of DME;Decreased safety awareness;Decreased strength  PT Treatment Interventions DME instruction;Gait training;Stair training;Functional mobility training;Therapeutic activities;Therapeutic exercise;Balance training;Neuromuscular re-education;Patient/family education    PT Goals (Current goals can be found in the Care Plan section)  Acute Rehab PT Goals Patient Stated Goal: return home PT Goal Formulation: With patient Time For Goal Achievement: 02/15/16 Potential to  Achieve Goals: Good    Frequency Min 3X/week   Barriers to discharge        Co-evaluation               End of Session Equipment Utilized During Treatment: Gait belt Activity Tolerance: Patient tolerated treatment well Patient left: in bed;with call bell/phone within reach;Other (comment) (sitting EOB) Nurse Communication: Mobility status         Time: 1350-1400 PT Time Calculation (min) (ACUTE ONLY): 10 min   Charges:   PT Evaluation $PT Eval Moderate Complexity: 1 Procedure     PT G CodesAlessandra Bevels:        Makinzie Considine M Annalyce Lanpher 02/01/2016, 3:37 PM Deborah ChalkJennifer Numair Masden, PT, DPT [phone]

## 2016-02-01 NOTE — Procedures (Signed)
Patient seen on Hemodialysis. QB 400, UF goal 2L Treatment adjusted as needed.  Zetta BillsJay Ridwan Bondy MD [facility]. Office # [phone] Pager # [phone] 8:09 AM

## 2016-02-05 ENCOUNTER — Ambulatory Visit: Payer: Medicare Other | Admitting: Physician Assistant

## 2016-02-12 ENCOUNTER — Ambulatory Visit: Payer: Medicare Other | Admitting: Physician Assistant

## 2016-02-12 IMAGING — CT CT KNEE*R* W/O CM
3 of 4 series · 11 of 20 positions shown, 13 images · non-contrast
Comparison: 05/08/2013

CLINICAL DATA: Right knee swelling and discoloration. Blood thinner
use. Knee pain.

EXAM:
CT OF THE RIGHT KNEE WITHOUT CONTRAST
TECHNIQUE: Multidetector CT imaging was performed according to the standard
protocol. Multiplanar CT image reconstructions were also generated.

[Series 3: lower ext 3.0 u90u · axial · 0.38mm/px · z∈[-316,-64]mm · 5 of 126 slices shown, 7 images]
[im 21/126  soft-tissue]
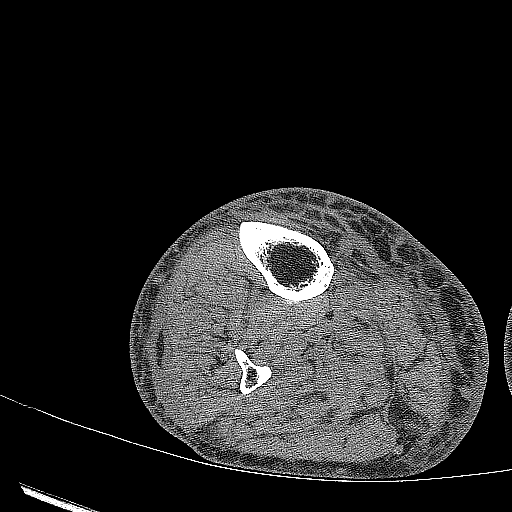
[im 21/126  bone]
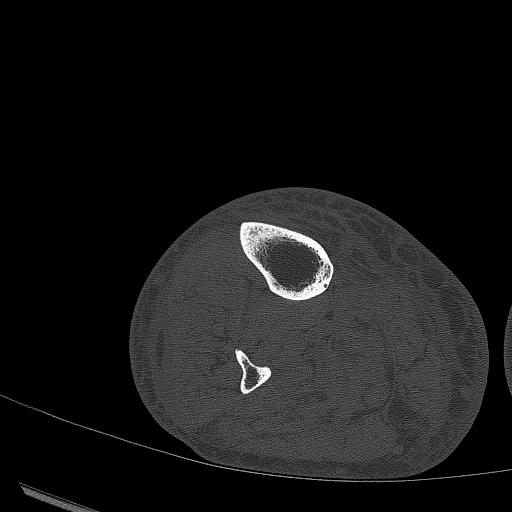
[im 42/126  bone]
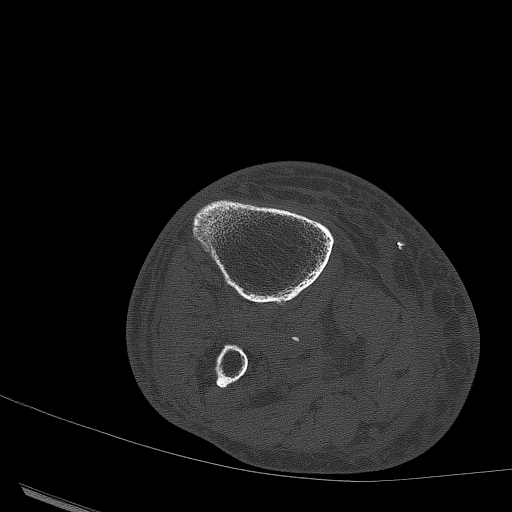
[im 63/126  bone]
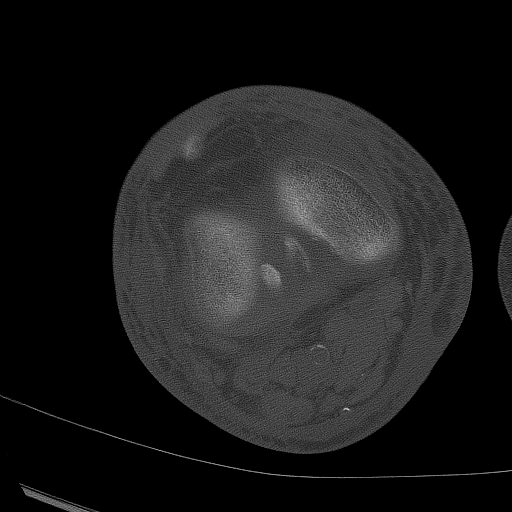
[im 84/126  bone]
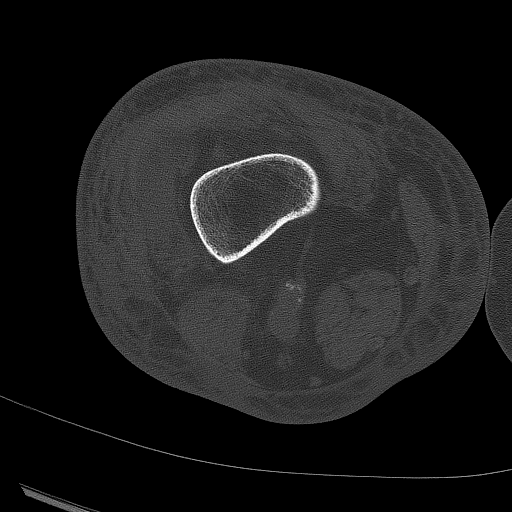
[im 105/126  soft-tissue]
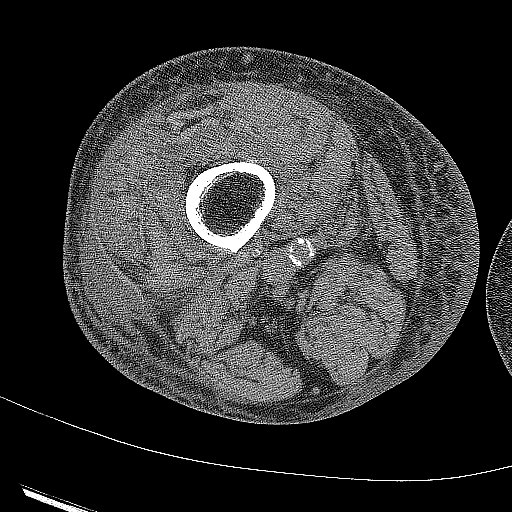
[im 105/126  bone]
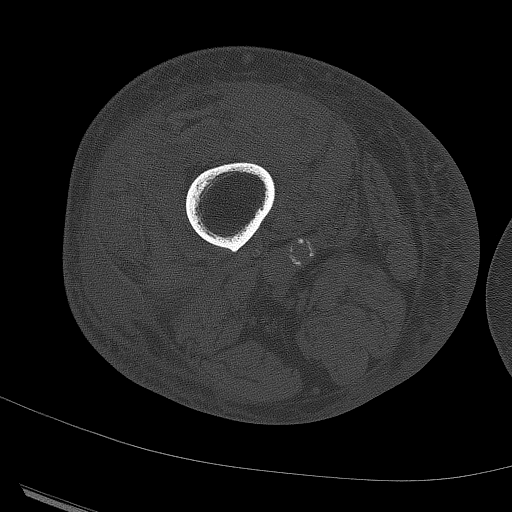

[Series 4: lower ext 3.0 u30u · axial · 0.38mm/px · z∈[-316,-64]mm · 5 of 126 slices shown]
[im 21/126  bone]
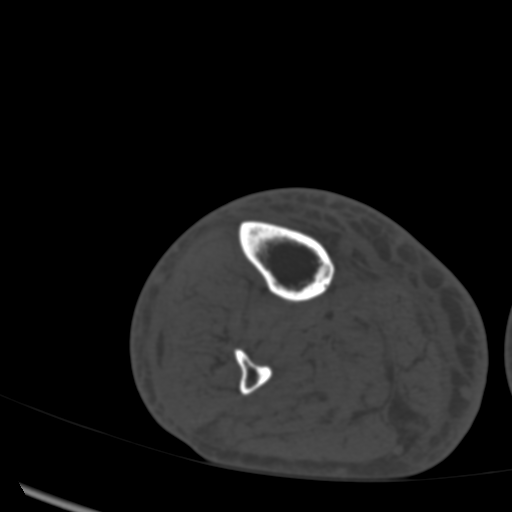
[im 42/126  bone]
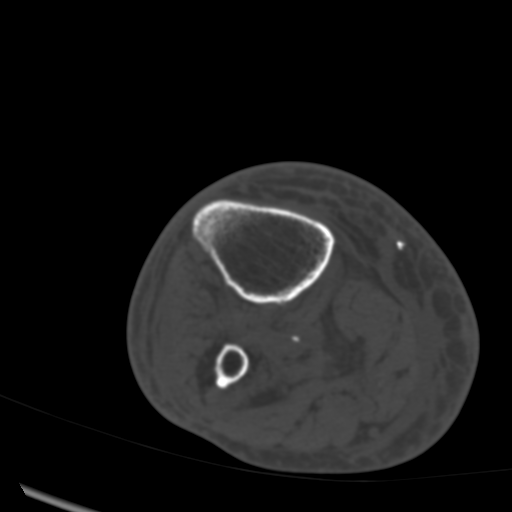
[im 63/126  bone]
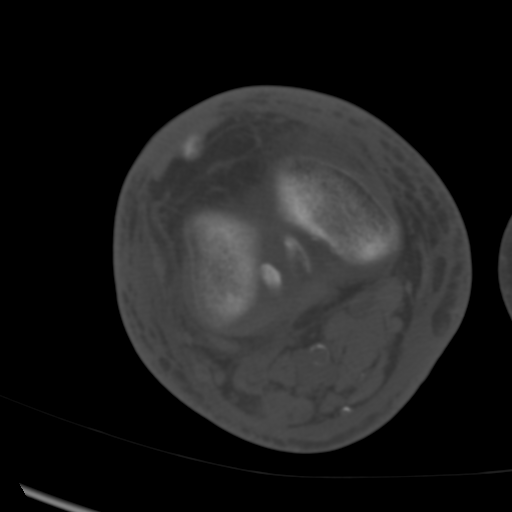
[im 84/126  bone]
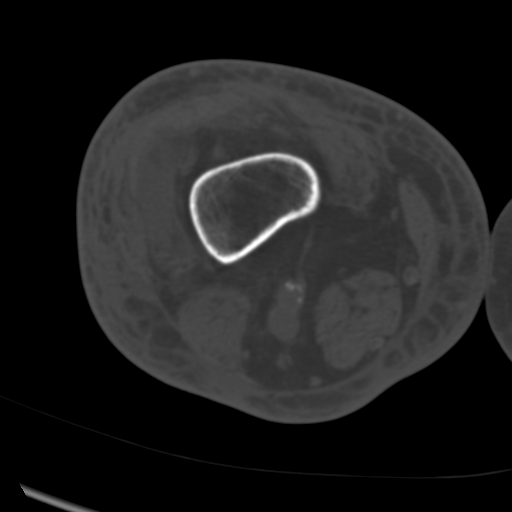
[im 105/126  bone]
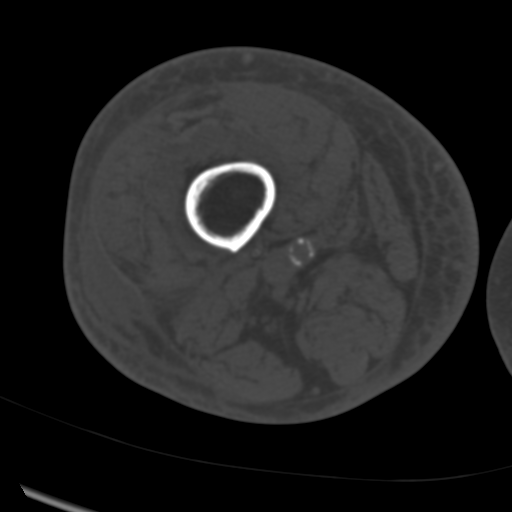

[Series 5: coronal bone · coronal · 0.35mm/px · 1 of 92 slices shown]
[im 46/92  bone]
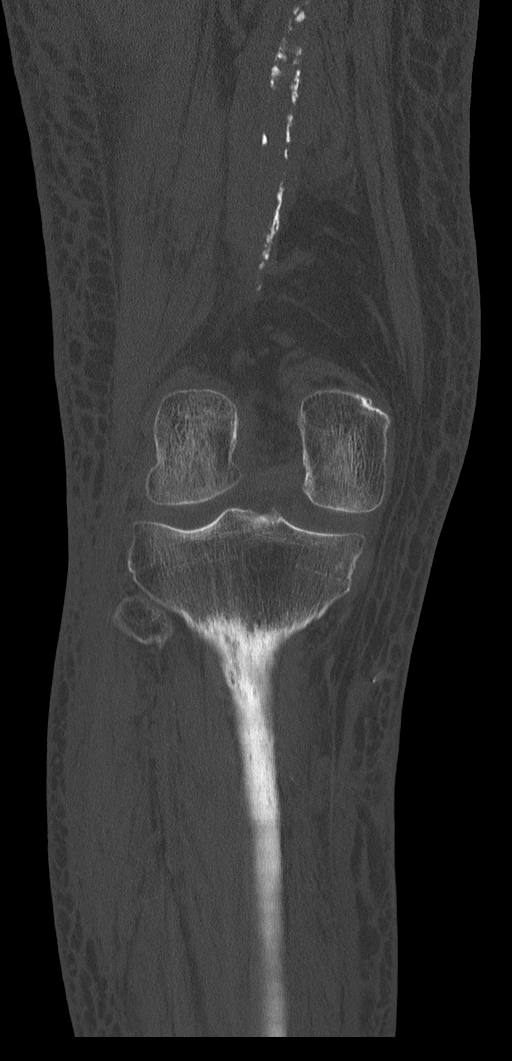

[11 of 20 positions shown; findings below may reference images not displayed]

FINDINGS: A large field of view was performed as requested, including 19 cm
above the knee joint and 19 cm below the knee joint.

On the top most images, we demonstrate the bottom of a suspected
hematoma in the vastus lateralis, as shown on image 54 of series 8
and image 1 of series 4, but this is in the upper thigh region and
not close to the knee.

There is subcutaneous edema in the upper thigh, around the knee
(most confluent anteriorly) and diffusely in the upper calf. The
edema along the anterior knee anterior to the patella is high in
density and accordingly could certainly represent subcutaneous
hematoma. Trace knee joint effusion. No obvious effacement of
muscular compartments around the knee.

Bony demineralization noted. No fracture identified. Arterial
atherosclerotic calcification is present.

On image 95 of series 4, there is some focal indistinct high density
in the subcutaneous tissues potentially reflecting a second focus of
poorly marginated bruising/hematoma.
IMPRESSION: 1. High subcutaneous density anterior to the patella and along the
anterolateral and anteromedial knee could represent subcutaneous
hematoma. Possible bruising/ small subcutaneous hematoma along the
proximal anteromedial calf. There is diffuse edema in the
subcutaneous tissues of the lower half of the thigh and upper half
of the calf.
2. On the very top most images, about 19 cm proximal to the knee
joint, there is an appearance suggesting possible hematoma in the
vastus lateralis muscle. Only the bottom most extent of this process
was captured on today's exam, despite the use of a very large field
of view around the knee.

## 2016-02-14 ENCOUNTER — Encounter: Payer: Medicare Other | Admitting: Hematology

## 2016-02-21 DEATH — deceased

## 2016-02-26 ENCOUNTER — Ambulatory Visit: Payer: Medicare Other | Admitting: Cardiovascular Disease

## 2016-06-20 NOTE — Addendum Note (Signed)
Addendum  created 06/20/16 0857 by Taneasha Fuqua, MD   Sign clinical note    

## 2016-06-20 NOTE — Addendum Note (Signed)
Addendum  created 06/20/16 [id number] by Sharee HolsterMassagee, Briston Lax, MD   Sign clinical note
